# Patient Record
Sex: Female | Born: 1970 | Race: Black or African American | Hispanic: No | Marital: Married | Smoking: Never smoker
Health system: Southern US, Community
[De-identification: ages and names within clinical notes are randomized; demographics above are authoritative.]

## PROBLEM LIST (undated history)

## (undated) DIAGNOSIS — B354 Tinea corporis: Secondary | ICD-10-CM

## (undated) DIAGNOSIS — Z0000 Encounter for general adult medical examination without abnormal findings: Secondary | ICD-10-CM

## (undated) DIAGNOSIS — E079 Disorder of thyroid, unspecified: Secondary | ICD-10-CM

## (undated) DIAGNOSIS — T7840XA Allergy, unspecified, initial encounter: Secondary | ICD-10-CM

## (undated) DIAGNOSIS — I10 Essential (primary) hypertension: Secondary | ICD-10-CM

## (undated) DIAGNOSIS — IMO0002 Reserved for concepts with insufficient information to code with codable children: Secondary | ICD-10-CM

## (undated) DIAGNOSIS — E785 Hyperlipidemia, unspecified: Secondary | ICD-10-CM

## (undated) DIAGNOSIS — D649 Anemia, unspecified: Secondary | ICD-10-CM

## (undated) DIAGNOSIS — R51 Headache: Secondary | ICD-10-CM

## (undated) DIAGNOSIS — J189 Pneumonia, unspecified organism: Secondary | ICD-10-CM

## (undated) DIAGNOSIS — Z8489 Family history of other specified conditions: Secondary | ICD-10-CM

## (undated) DIAGNOSIS — E119 Type 2 diabetes mellitus without complications: Secondary | ICD-10-CM

## (undated) DIAGNOSIS — M25552 Pain in left hip: Secondary | ICD-10-CM

## (undated) DIAGNOSIS — M545 Low back pain: Secondary | ICD-10-CM

## (undated) DIAGNOSIS — R739 Hyperglycemia, unspecified: Secondary | ICD-10-CM

## (undated) DIAGNOSIS — S30861A Insect bite (nonvenomous) of abdominal wall, initial encounter: Secondary | ICD-10-CM

## (undated) DIAGNOSIS — K219 Gastro-esophageal reflux disease without esophagitis: Secondary | ICD-10-CM

## (undated) DIAGNOSIS — F32A Depression, unspecified: Secondary | ICD-10-CM

## (undated) DIAGNOSIS — M797 Fibromyalgia: Secondary | ICD-10-CM

## (undated) DIAGNOSIS — G629 Polyneuropathy, unspecified: Secondary | ICD-10-CM

## (undated) DIAGNOSIS — E049 Nontoxic goiter, unspecified: Secondary | ICD-10-CM

## (undated) DIAGNOSIS — D259 Leiomyoma of uterus, unspecified: Secondary | ICD-10-CM

## (undated) DIAGNOSIS — F419 Anxiety disorder, unspecified: Secondary | ICD-10-CM

## (undated) DIAGNOSIS — R5383 Other fatigue: Secondary | ICD-10-CM

## (undated) DIAGNOSIS — M25551 Pain in right hip: Secondary | ICD-10-CM

## (undated) DIAGNOSIS — R41840 Attention and concentration deficit: Secondary | ICD-10-CM

## (undated) DIAGNOSIS — J40 Bronchitis, not specified as acute or chronic: Secondary | ICD-10-CM

## (undated) DIAGNOSIS — I499 Cardiac arrhythmia, unspecified: Secondary | ICD-10-CM

## (undated) DIAGNOSIS — R000 Tachycardia, unspecified: Secondary | ICD-10-CM

## (undated) DIAGNOSIS — T8859XA Other complications of anesthesia, initial encounter: Secondary | ICD-10-CM

## (undated) DIAGNOSIS — J019 Acute sinusitis, unspecified: Secondary | ICD-10-CM

## (undated) DIAGNOSIS — W57XXXA Bitten or stung by nonvenomous insect and other nonvenomous arthropods, initial encounter: Secondary | ICD-10-CM

## (undated) DIAGNOSIS — T4145XA Adverse effect of unspecified anesthetic, initial encounter: Secondary | ICD-10-CM

## (undated) DIAGNOSIS — F329 Major depressive disorder, single episode, unspecified: Secondary | ICD-10-CM

## (undated) HISTORY — DX: Low back pain: M54.5

## (undated) HISTORY — DX: Bitten or stung by nonvenomous insect and other nonvenomous arthropods, initial encounter: W57.XXXA

## (undated) HISTORY — DX: Tachycardia, unspecified: R00.0

## (undated) HISTORY — DX: Anxiety disorder, unspecified: F41.9

## (undated) HISTORY — DX: Allergy, unspecified, initial encounter: T78.40XA

## (undated) HISTORY — DX: Reserved for concepts with insufficient information to code with codable children: IMO0002

## (undated) HISTORY — DX: Fibromyalgia: M79.7

## (undated) HISTORY — DX: Polyneuropathy, unspecified: G62.9

## (undated) HISTORY — DX: Gastro-esophageal reflux disease without esophagitis: K21.9

## (undated) HISTORY — DX: Leiomyoma of uterus, unspecified: D25.9

## (undated) HISTORY — DX: Type 2 diabetes mellitus without complications: E11.9

## (undated) HISTORY — DX: Insect bite (nonvenomous) of abdominal wall, initial encounter: S30.861A

## (undated) HISTORY — DX: Other fatigue: R53.83

## (undated) HISTORY — DX: Bronchitis, not specified as acute or chronic: J40

## (undated) HISTORY — DX: Hyperglycemia, unspecified: R73.9

## (undated) HISTORY — DX: Pain in right hip: M25.551

## (undated) HISTORY — DX: Anemia, unspecified: D64.9

## (undated) HISTORY — DX: Depression, unspecified: F32.A

## (undated) HISTORY — DX: Major depressive disorder, single episode, unspecified: F32.9

## (undated) HISTORY — DX: Pain in left hip: M25.552

## (undated) HISTORY — DX: Headache: R51

## (undated) HISTORY — DX: Attention and concentration deficit: R41.840

## (undated) HISTORY — DX: Acute sinusitis, unspecified: J01.90

## (undated) HISTORY — DX: Hyperlipidemia, unspecified: E78.5

## (undated) HISTORY — DX: Encounter for general adult medical examination without abnormal findings: Z00.00

## (undated) HISTORY — DX: Tinea corporis: B35.4

## (undated) HISTORY — DX: Disorder of thyroid, unspecified: E07.9

## (undated) HISTORY — DX: Essential (primary) hypertension: I10

---

## 1997-02-01 HISTORY — PX: BREAST LUMPECTOMY: SHX2

## 1997-10-24 ENCOUNTER — Other Ambulatory Visit: Admission: RE | Admit: 1997-10-24 | Discharge: 1997-10-24 | Payer: Self-pay | Admitting: Obstetrics and Gynecology

## 1998-07-25 ENCOUNTER — Ambulatory Visit (HOSPITAL_BASED_OUTPATIENT_CLINIC_OR_DEPARTMENT_OTHER): Admission: RE | Admit: 1998-07-25 | Discharge: 1998-07-25 | Payer: Self-pay | Admitting: General Surgery

## 1998-10-29 ENCOUNTER — Other Ambulatory Visit: Admission: RE | Admit: 1998-10-29 | Discharge: 1998-10-29 | Payer: Self-pay | Admitting: Obstetrics and Gynecology

## 1999-11-10 ENCOUNTER — Other Ambulatory Visit: Admission: RE | Admit: 1999-11-10 | Discharge: 1999-11-10 | Payer: Self-pay | Admitting: Obstetrics and Gynecology

## 2001-12-26 ENCOUNTER — Other Ambulatory Visit: Admission: RE | Admit: 2001-12-26 | Discharge: 2001-12-26 | Payer: Self-pay | Admitting: Obstetrics and Gynecology

## 2002-03-17 ENCOUNTER — Inpatient Hospital Stay (HOSPITAL_COMMUNITY): Admission: AD | Admit: 2002-03-17 | Discharge: 2002-03-17 | Payer: Self-pay | Admitting: Obstetrics and Gynecology

## 2002-03-17 ENCOUNTER — Encounter: Payer: Self-pay | Admitting: Obstetrics and Gynecology

## 2002-07-25 ENCOUNTER — Inpatient Hospital Stay (HOSPITAL_COMMUNITY): Admission: AD | Admit: 2002-07-25 | Discharge: 2002-07-28 | Payer: Self-pay | Admitting: Obstetrics and Gynecology

## 2002-08-02 ENCOUNTER — Encounter (INDEPENDENT_AMBULATORY_CARE_PROVIDER_SITE_OTHER): Payer: Self-pay | Admitting: *Deleted

## 2002-08-02 LAB — CONVERTED CEMR LAB

## 2002-08-27 ENCOUNTER — Other Ambulatory Visit: Admission: RE | Admit: 2002-08-27 | Discharge: 2002-08-27 | Payer: Self-pay | Admitting: Obstetrics and Gynecology

## 2003-05-20 ENCOUNTER — Encounter: Admission: RE | Admit: 2003-05-20 | Discharge: 2003-05-20 | Payer: Self-pay | Admitting: Family Medicine

## 2003-05-23 ENCOUNTER — Encounter: Admission: RE | Admit: 2003-05-23 | Discharge: 2003-05-23 | Payer: Self-pay | Admitting: Family Medicine

## 2005-02-04 ENCOUNTER — Emergency Department (HOSPITAL_COMMUNITY): Admission: EM | Admit: 2005-02-04 | Discharge: 2005-02-05 | Payer: Self-pay | Admitting: Emergency Medicine

## 2006-04-01 ENCOUNTER — Encounter (INDEPENDENT_AMBULATORY_CARE_PROVIDER_SITE_OTHER): Payer: Self-pay | Admitting: *Deleted

## 2007-02-02 DIAGNOSIS — M797 Fibromyalgia: Secondary | ICD-10-CM

## 2007-02-02 HISTORY — DX: Fibromyalgia: M79.7

## 2008-10-04 ENCOUNTER — Emergency Department (HOSPITAL_COMMUNITY): Admission: EM | Admit: 2008-10-04 | Discharge: 2008-10-05 | Payer: Self-pay | Admitting: Emergency Medicine

## 2009-11-26 ENCOUNTER — Encounter: Admission: RE | Admit: 2009-11-26 | Discharge: 2009-11-26 | Payer: Self-pay | Admitting: Obstetrics and Gynecology

## 2010-05-08 LAB — URINALYSIS, ROUTINE W REFLEX MICROSCOPIC
Bilirubin Urine: NEGATIVE
Glucose, UA: NEGATIVE mg/dL
Hgb urine dipstick: NEGATIVE
Ketones, ur: >80 mg/dL — AB
Nitrite: NEGATIVE
Protein, ur: NEGATIVE mg/dL
Specific Gravity, Urine: 1.023 (ref 1.005–1.030)
Urobilinogen, UA: 0.2 mg/dL (ref 0.0–1.0)
pH: 7.5 (ref 5.0–8.0)

## 2010-05-08 LAB — URINE MICROSCOPIC-ADD ON

## 2010-05-08 LAB — COMPREHENSIVE METABOLIC PANEL
AST: 28 U/L (ref 0–37)
Albumin: 4.3 g/dL (ref 3.5–5.2)
BUN: 7 mg/dL (ref 6–23)
Calcium: 9.5 mg/dL (ref 8.4–10.5)
Chloride: 103 mEq/L (ref 96–112)
Creatinine, Ser: 0.75 mg/dL (ref 0.4–1.2)
GFR calc Af Amer: >60 mL/min (ref >60)
Total Protein: 7.9 g/dL (ref 6.0–8.3)

## 2010-05-08 LAB — DIFFERENTIAL
Basophils Absolute: 0 10*3/uL (ref 0.0–0.1)
Eosinophils Relative: 0 % (ref 0–5)
Lymphocytes Relative: 22 % (ref 12–46)
Lymphs Abs: 1.7 10*3/uL (ref 0.7–4.0)
Monocytes Absolute: 0.4 10*3/uL (ref 0.1–1.0)
Monocytes Relative: 5 % (ref 3–12)
Neutro Abs: 5.7 10*3/uL (ref 1.7–7.7)

## 2010-05-08 LAB — CBC
HCT: 39.2 % (ref 36.0–46.0)
MCV: 89.3 fL (ref 78.0–100.0)
Platelets: 343 10*3/uL (ref 150–400)
RDW: 16.2 % — ABNORMAL HIGH (ref 11.5–15.5)
WBC: 7.8 10*3/uL (ref 4.0–10.5)

## 2010-06-19 NOTE — H&P (Signed)
   NAME:  Lauren Hall, Lauren Hall NO.:  [id number]   MEDICAL RECORD NO.:  [id number]                   PATIENT TYPE:   LOCATION:                                       FACILITY:   PHYSICIAN:  Lenoard Aden, M.D.             DATE OF BIRTH:   DATE OF ADMISSION:  07/24/2002  DATE OF DISCHARGE:                                HISTORY & PHYSICAL   CHIEF COMPLAINT:  IUGR.   HISTORY OF PRESENT ILLNESS:  A 40 year old African-American female, gravida  1, para 0, EDC August 06, 2002, at 38 weeks, who presents for induction for  IUGR, cervical ripening and induction.   PAST MEDICAL HISTORY:  Remarkable for no medical hospitalizations. History  of lumpectomy.   FAMILY HISTORY:  History of heart disease.  Insulin-dependent diabetes  mellitus.   PRENATAL LABORATORY DATA:  Blood type of Rh positive, rubella immune,  hepatitis B surface antigen negative, HIV nonreactive.   PHYSICAL EXAMINATION:  GENERAL: She is a well-developed, well-nourished,  African-American female in no acute distress.  HEENT:  Normal.  LUNGS:  Clear.  HEART:  Regular rate and rhythm.  ABDOMEN:  Soft, gravid, and nontender. Estimated fetal weight on ultrasound  5 pounds 12 ounces.  PELVIC:  Cervix is closed, 2 cm, and vertex -1.  EXTREMITIES:  No cords.  NEUROLOGY:  Nonfocal.   IMPRESSION:  1. 38-week intrauterine pregnancy.  2. Intrauterine growth retardation for cervical ripening and induction.   PLAN:  Proceed with cervical ripening and induction.                                               Lenoard Aden, M.D.    RJT/MEDQ  D:  07/23/2002  T:  07/23/2002  Job:  [id number]

## 2010-06-19 NOTE — Op Note (Signed)
   NAME:  Lauren Hall, Lauren Hall                        ACCOUNT NO.:  [id number]   MEDICAL RECORD NO.:  [id number]                   PATIENT TYPE:  INP   LOCATION:  9130                                 FACILITY:  [facility]   PHYSICIAN:  Lenoard Aden, M.D.             DATE OF BIRTH:  [birth year]   DATE OF PROCEDURE:  07/26/2002  DATE OF DISCHARGE:                                 OPERATIVE REPORT   INDICATION FOR OPERATIVE DELIVERY:  1. Persistent variable decelerations.  2. Nonreassuring fetal heart rate.   POSTOPERATIVE DIAGNOSES:  1. Persistent variable decelerations.  2. Nonreassuring fetal heart rate.  3. Moderate to severe shoulder dystocia.   DESCRIPTION OF PROCEDURE:  After being apprised of risks and benefits of  vacuum assistance including small risk of scalp laceration, cephalohematoma,  and intracranial hemorrhage, a Kiwi cup is placed on fetal vertex.  ROA less  than 45 degrees, +3 station for three pulls over central median episiotomy  of a full-term living female.  Upon delivery of the vertex, turtle sign is  observed and moderate to severe shoulder dystocia is encountered.  The  McRoberts maneuver is assumed.  Suprapubic pressure is applied with little  release of the anterior shoulder.  Wood screw maneuver is performed moving  the baby into a transverse position whereby spontaneous expulsion of the  fetus is noted.  Apgars 4 and 8, pediatricians called and are in attendance.  Cord blood collected, cord pH of 7.19.  The placenta is delivered  spontaneously intact, three vessel cord.  Cervix and vagina without  lacerations.  Repair of a second-degree episiotomy with a partial third-  degree extension with 3-0 Monocryl and a 3-0 Vicryl Rapide without  complications.  Good hemostasis is noted.  Estimated blood loss 600 mL.  Mother and baby tolerated the procedure well and are both recovering in good  condition.  The fetus is in newborn nursery.                    Lenoard Aden, M.D.    RJT/MEDQ  D:  07/26/2002  T:  07/28/2002  Job:  [id number]

## 2010-10-07 ENCOUNTER — Ambulatory Visit (INDEPENDENT_AMBULATORY_CARE_PROVIDER_SITE_OTHER): Payer: BC Managed Care – PPO | Admitting: Family Medicine

## 2010-10-07 ENCOUNTER — Encounter: Payer: Self-pay | Admitting: Family Medicine

## 2010-10-07 VITALS — BP 147/89 | HR 87 | Temp 98.7°F | Ht 59.25 in | Wt 131.1 lb

## 2010-10-07 DIAGNOSIS — R21 Rash and other nonspecific skin eruption: Secondary | ICD-10-CM

## 2010-10-07 DIAGNOSIS — T7840XA Allergy, unspecified, initial encounter: Secondary | ICD-10-CM | POA: Insufficient documentation

## 2010-10-07 DIAGNOSIS — E079 Disorder of thyroid, unspecified: Secondary | ICD-10-CM

## 2010-10-07 DIAGNOSIS — Z23 Encounter for immunization: Secondary | ICD-10-CM

## 2010-10-07 DIAGNOSIS — F418 Other specified anxiety disorders: Secondary | ICD-10-CM | POA: Insufficient documentation

## 2010-10-07 DIAGNOSIS — R51 Headache: Secondary | ICD-10-CM

## 2010-10-07 DIAGNOSIS — M797 Fibromyalgia: Secondary | ICD-10-CM

## 2010-10-07 DIAGNOSIS — IMO0001 Reserved for inherently not codable concepts without codable children: Secondary | ICD-10-CM

## 2010-10-07 DIAGNOSIS — I10 Essential (primary) hypertension: Secondary | ICD-10-CM

## 2010-10-07 DIAGNOSIS — R5383 Other fatigue: Secondary | ICD-10-CM

## 2010-10-07 DIAGNOSIS — R5381 Other malaise: Secondary | ICD-10-CM

## 2010-10-07 DIAGNOSIS — E559 Vitamin D deficiency, unspecified: Secondary | ICD-10-CM

## 2010-10-07 DIAGNOSIS — E785 Hyperlipidemia, unspecified: Secondary | ICD-10-CM

## 2010-10-07 DIAGNOSIS — F329 Major depressive disorder, single episode, unspecified: Secondary | ICD-10-CM | POA: Insufficient documentation

## 2010-10-07 DIAGNOSIS — D649 Anemia, unspecified: Secondary | ICD-10-CM | POA: Insufficient documentation

## 2010-10-07 DIAGNOSIS — K5900 Constipation, unspecified: Secondary | ICD-10-CM

## 2010-10-07 LAB — LIPID PANEL
HDL: 70.3 mg/dL (ref >39.00)
LDL Cholesterol: 86 mg/dL (ref 0–99)
VLDL: 19.2 mg/dL (ref 0.0–40.0)

## 2010-10-07 LAB — HEPATIC FUNCTION PANEL
Albumin: 4.3 g/dL (ref 3.5–5.2)
Alkaline Phosphatase: 43 U/L (ref 39–117)
Total Bilirubin: 0.5 mg/dL (ref 0.3–1.2)

## 2010-10-07 LAB — TSH: TSH: 0.26 u[IU]/mL — ABNORMAL LOW (ref 0.35–5.50)

## 2010-10-07 LAB — T4, FREE: Free T4: 0.69 ng/dL (ref 0.60–1.60)

## 2010-10-07 LAB — MAGNESIUM: Magnesium: 2.1 mg/dL (ref 1.5–2.5)

## 2010-10-07 LAB — SEDIMENTATION RATE: Sed Rate: 6 mm/hr (ref 0–22)

## 2010-10-07 LAB — RENAL FUNCTION PANEL
BUN: 8 mg/dL (ref 6–23)
CO2: 24 mEq/L (ref 19–32)
Chloride: 104 mEq/L (ref 96–112)
GFR: 133.35 mL/min (ref >60.00)
Phosphorus: 2.7 mg/dL (ref 2.3–4.6)
Potassium: 3.9 mEq/L (ref 3.5–5.1)
Sodium: 137 mEq/L (ref 135–145)

## 2010-10-07 LAB — FERRITIN: Ferritin: 79.6 ng/mL (ref 10.0–291.0)

## 2010-10-07 MED ORDER — BENEFIBER PO POWD: ORAL | Status: DC

## 2010-10-07 MED ORDER — OMEGA-3 FATTY ACIDS 1000 MG PO CAPS: ORAL_CAPSULE | ORAL | Status: DC

## 2010-10-07 MED ORDER — ALIGN PO CAPS: 1.0000 | ORAL_CAPSULE | Freq: Every day | ORAL | Status: DC

## 2010-10-07 MED ORDER — BACLOFEN 10 MG PO TABS: 10.0000 mg | ORAL_TABLET | Freq: Two times a day (BID) | ORAL | Status: DC | PRN

## 2010-10-07 NOTE — Patient Instructions (Addendum)
Fibromyalgia Fibromyalgia is a disorder that is often misunderstood. It is associated with muscular pains and tenderness that comes and goes. It is often associated with fatigue and sleep disturbances. Though it tends to be long-lasting, fibromyalgia is not life-threatening. CAUSES The exact cause of fibromyalgia is unknown. People with certain gene types are predisposed to developing fibromyalgia and other conditions. Certain factors can play a role as triggers, such as:  Spine disorders.   Arthritis.   Severe injury (trauma) and other physical stressors.   Emotional stressors.  SYMPTOMS  The main symptom is pain and stiffness in the muscles and joints, which can vary over time.   Sleep and fatigue problems.  Other related symptoms may include:  Bowel and bladder problems.   Headaches.   Visual problems.   Problems with odors and noises.   Depression or mood changes.   Painful periods (dysmenorrhea).   Dryness of the skin or eyes.  DIAGNOSIS There are no specific tests for diagnosing fibromyalgia. Patients can be diagnosed accurately from the specific symptoms they have. The diagnosis is made by determining that nothing else is causing the problems. TREATMENT There is no cure. Management includes medicines and an active, healthy lifestyle. The goal is to enhance physical fitness, decrease pain, and improve sleep. HOME CARE INSTRUCTIONS  Only take over-the-counter or prescription medicines as directed by your caregiver. Sleeping pills, tranquilizers, and pain medicines may make your problems worse.   Low-impact aerobic exercise is very important and advised for treatment. At first, it may seem to make pain worse. Gradually increasing your tolerance will overcome this feeling.   Learning relaxation techniques and how to control stress will help you. Biofeedback, visual imagery, hypnosis, muscle relaxation, yoga, and meditation are all options.   Anti-inflammatory medicines  and physical therapy may provide short-term help.   Acupuncture or massage treatments may help.   Take muscle relaxant medicines as suggested by your caregiver.   Avoid stressful situations.   Plan a healthy lifestyle. This includes your diet, sleep, rest, exercise, and friends.   Find and practice a hobby you enjoy.   Join a fibromyalgia support group for interaction, ideas, and sharing advice. This may be helpful.  SEEK MEDICAL CARE IF: You are not having good results or improvement from your treatment. FOR MORE INFORMATION National Fibromyalgia Association: www.fmaware.org Arthritis Foundation: www.arthritis.org Document Released: 01/18/2005 Document Re-Released: 07/08/2009 [facility] Patient Information 2011 [place], [place].  Remember 64 oz of clear fluids daily, avoid caffeine Add 10 minutes of exercise twice a day Eat small, frequent meals, avoid trans fats (AKA partially hydrogenated oils) Consider counseling Start a probiotic such as Align daily  Need 7-8 hours of sleep nightly, dark, quiet, cool room for sleep

## 2010-10-08 ENCOUNTER — Encounter: Payer: Self-pay | Admitting: Family Medicine

## 2010-10-08 DIAGNOSIS — E079 Disorder of thyroid, unspecified: Secondary | ICD-10-CM

## 2010-10-08 DIAGNOSIS — E039 Hypothyroidism, unspecified: Secondary | ICD-10-CM

## 2010-10-08 DIAGNOSIS — R5383 Other fatigue: Secondary | ICD-10-CM | POA: Insufficient documentation

## 2010-10-08 DIAGNOSIS — R51 Headache: Secondary | ICD-10-CM

## 2010-10-08 HISTORY — DX: Headache: R51

## 2010-10-08 HISTORY — DX: Hypothyroidism, unspecified: E03.9

## 2010-10-08 HISTORY — DX: Other fatigue: R53.83

## 2010-10-08 HISTORY — DX: Disorder of thyroid, unspecified: E07.9

## 2010-10-08 LAB — IRON AND TIBC: Iron: 175 ug/dL — ABNORMAL HIGH (ref 42–145)

## 2010-10-08 NOTE — Assessment & Plan Note (Signed)
Patient reports a history of fluctuating TSH levels in the past, we will request old records. Her TSH is mildly suppressed today but her freeT4 in normal will repeat levels at next visit to further evaluate.

## 2010-10-08 NOTE — Assessment & Plan Note (Signed)
Is on Pristiq 100mg  daily and feels it helps somewhat but not enough. She reports poor response to SSRI in past.

## 2010-10-08 NOTE — Assessment & Plan Note (Addendum)
Patient noting increased muscle and hand and ankle pain. Will add Baclofen 10mg  bid for muscle pain. Encouraged her to start a fish oil cap and to get more exercise. Reassess at next visit.   Lyrica and Celebrex

## 2010-10-08 NOTE — Assessment & Plan Note (Signed)
Multifactorial and related to fibromyalgia, anemia, depression, poor diet and fluid intake etc. She is advised to get 8 hours of sleep nightly, eat small, frequent meals with lean proteins and complex carbs. She needs to increase her fluids to at least 64 oz daily and she needs to add in a brisk walk daily

## 2010-10-08 NOTE — Assessment & Plan Note (Signed)
No recent flares 

## 2010-10-08 NOTE — Progress Notes (Signed)
Lauren Hall [id number] 1970-04-03 10/08/2010      Progress Note New Patient  Subjective  Chief Complaint  Chief Complaint  Patient presents with  . Establish Care    new patient    HPI  Patient is a 40 yo AA female in today for new patient appt. She has a long history of fibromyalgia, fatigue and depression. She is concerned because several months ago she was bit by a tick on her right hip. She developed a red, swollen, pruritic lesion at that site and she feels she has been struggling with more HA/fatigue/myalgias since that time. She takes multiple medications for her fibromyalgia and yet she still struggles with daily pain. No recent respiratory illness, CP/palp/sob/GI or GU c/o. She denies any suicidal ideation but she does believe her depression is overwhelming largely due to her pain. She has trouble sleeping secondary to the pain.   Past Medical History  Diagnosis Date  . Anxiety   . Fibromyalgia 2009  . Fibromyalgia   . Anemia   . Allergy   . Depression   . Hypertension   . Fatigue 10/08/2010  . Headache 10/08/2010  . Thyroid disease 10/08/2010    Past Surgical History  Procedure Date  . Breast lumpectomy 1999    right- benign    Family History  Problem Relation Age of Onset  . Fibromyalgia Mother   . Diabetes Mother     type 2  . Hypertension Mother   . Hyperlipidemia Mother   . Alcohol abuse Father   . Cancer Father     colon  . Hypertension Brother   . Heart disease Maternal Grandmother   . Hyperlipidemia Maternal Grandmother   . Diabetes Maternal Grandmother     type 2  . Cancer Maternal Grandmother     colon  . Stroke Maternal Grandfather   . Diabetes Maternal Grandfather     type 2  . Cancer Paternal Grandmother     ovarian  . Alzheimer's disease Paternal Grandfather   . Alcohol abuse Brother     History   Social History  . Marital Status: Married    Spouse Name: N/A    Number of Children: N/A  . Years of Education: N/A    Occupational History  . Not on file.   Social History Main Topics  . Smoking status: Never Smoker   . Smokeless tobacco: Never Used  . Alcohol Use: Yes     special occassion- very rarely  . Drug Use: No  . Sexually Active: Yes -- Female partner(s)   Other Topics Concern  . Not on file   Social History Narrative  . No narrative on file    No current outpatient prescriptions on file prior to visit.    Allergies  Allergen Reactions  . Ritalin (Methylphenidate Hcl) Swelling    Tongue swelled    Review of Systems  Review of Systems  Constitutional: Positive for malaise/fatigue. Negative for fever and chills.  HENT: Positive for neck pain. Negative for hearing loss, nosebleeds and congestion.   Eyes: Negative for discharge.  Respiratory: Negative for cough, sputum production, shortness of breath and wheezing.   Cardiovascular: Negative for chest pain, palpitations and leg swelling.  Gastrointestinal: Positive for constipation. Negative for heartburn, nausea, vomiting, abdominal pain, diarrhea and blood in stool.  Genitourinary: Negative for dysuria, urgency, frequency and hematuria.  Musculoskeletal: Positive for myalgias, back pain and joint pain. Negative for falls.  Skin: Negative for rash.  Neurological: Positive for headaches.  Negative for dizziness, tremors, sensory change, focal weakness, seizures, loss of consciousness and weakness.  Endo/Heme/Allergies: Negative for polydipsia. Does not bruise/bleed easily.  Psychiatric/Behavioral: Positive for depression. Negative for suicidal ideas. The patient is not nervous/anxious and does not have insomnia.     Objective  BP 147/89  Pulse 87  Temp(Src) 98.7 F (37.1 C) (Oral)  Ht 4' 11.25" (1.505 m)  Wt 131 lb 1.9 oz (59.476 kg)  BMI 26.26 kg/m2  SpO2 99%  LMP 09/29/2010  Physical Exam  Physical Exam  Constitutional: She is oriented to person, place, and time and well-developed, well-nourished, and in no distress.  No distress.  HENT:  Head: Normocephalic and atraumatic.  Eyes: Conjunctivae are normal.  Neck: Neck supple. No thyromegaly present.  Cardiovascular: Normal rate, regular rhythm and normal heart sounds.   No murmur heard. Pulmonary/Chest: Effort normal and breath sounds normal. She has no wheezes.  Abdominal: She exhibits no distension and no mass.  Musculoskeletal: She exhibits no edema.  Lymphadenopathy:    She has no cervical adenopathy.  Neurological: She is alert and oriented to person, place, and time.  Skin: Skin is warm and dry. No rash noted. She is not diaphoretic.  Psychiatric: Memory, affect and judgment normal.       Assessment & Plan  Thyroid disease Patient reports a history of fluctuating TSH levels in the past, we will request old records. Her TSH is mildly suppressed today but her freeT4 in normal will repeat levels at next visit to further evaluate.  Headache Patient reports a bad tick bite a couple of months ago with a  Raised erythematous rash on her right anterio hip acocmpanied by increased malaise, fatigue and HA at that time. Will check Lyme, rmsf titers at her request. encouraged 64 oz of clear fluids and small frequent meals. She also notes some low grade fevers have been recuring to around a 100 many nights a week.  Fatigue Multifactorial and related to fibromyalgia, anemia, depression, poor diet and fluid intake etc. She is advised to get 8 hours of sleep nightly, eat small, frequent meals with lean proteins and complex carbs. She needs to increase her fluids to at least 64 oz daily and she needs to add in a brisk walk daily  Hypertension Tolerating Lisinopril with minimal elevation in BP today, will reassess at second visit before considering any medication changes.  Anemia Resolved with labs today. Will continue to monitor  Depression Is on Pristiq 100mg  daily and feels it helps somewhat but not enough. She reports poor response to SSRI in  past.  Fibromyalgia Patient noting increased muscle and hand and ankle pain. Will add Baclofen 10mg  bid for muscle pain. Encouraged her to start a fish oil cap and to get more exercise. Reassess at next visit.   Lyrica and Celebrex  Allergy No recent flares

## 2010-10-08 NOTE — Assessment & Plan Note (Addendum)
Tolerating Lisinopril with minimal elevation in BP today, will reassess at second visit before considering any medication changes.

## 2010-10-08 NOTE — Assessment & Plan Note (Addendum)
Patient reports a bad tick bite a couple of months ago with a  Raised erythematous rash on her right anterio hip acocmpanied by increased malaise, fatigue and HA at that time. Will check Lyme, rmsf titers at her request. encouraged 64 oz of clear fluids and small frequent meals. She also notes some low grade fevers have been recuring to around a 100 many nights a week.

## 2010-10-08 NOTE — Assessment & Plan Note (Signed)
Resolved with labs today. Will continue to monitor

## 2010-10-12 LAB — VITAMIN D 1,25 DIHYDROXY
Vitamin D 1, 25 (OH)2 Total: 81 pg/mL — ABNORMAL HIGH (ref 18–72)
Vitamin D2 1, 25 (OH)2: 17 pg/mL

## 2010-10-13 ENCOUNTER — Telehealth: Payer: Self-pay

## 2010-10-13 MED ORDER — LORAZEPAM 0.5 MG PO TABS: 0.5000 mg | ORAL_TABLET | Freq: Two times a day (BID) | ORAL | Status: DC | PRN

## 2010-10-13 NOTE — Telephone Encounter (Signed)
Pt would like to know if she needs to continue on the iron? If so is this something she will always have to take? Pt stated that MD was supposed to RX something for depression and anxiety to pharmacy?  Labs mailed to patient per request.

## 2010-10-13 NOTE — Telephone Encounter (Signed)
Faxed RX to pharmacy.  

## 2010-10-13 NOTE — Telephone Encounter (Signed)
Left a detailed message on patients voicemail.

## 2010-10-13 NOTE — Telephone Encounter (Signed)
Notify iron level was on the hi side so what she could do is just drop her OTC iron tab to just twice a week and we will recheck in the future. I did not want to change her baseline Pristiq until the next visit due to her complicated history. She can have a few Ativan 0.5mg  1 tab po bid prn anxiety/insomnia to use as needed. Disp#30, 1 rf til seen. I will consider switching her Pristiq to another agent but with the difficulties she has had in the past. I feel like we need to pick an agent together at the next visit.

## 2010-10-14 ENCOUNTER — Encounter: Payer: Self-pay | Admitting: Family Medicine

## 2010-11-06 ENCOUNTER — Ambulatory Visit (INDEPENDENT_AMBULATORY_CARE_PROVIDER_SITE_OTHER): Payer: BC Managed Care – PPO | Admitting: Family Medicine

## 2010-11-06 ENCOUNTER — Encounter: Payer: Self-pay | Admitting: Family Medicine

## 2010-11-06 DIAGNOSIS — R51 Headache: Secondary | ICD-10-CM

## 2010-11-06 DIAGNOSIS — R5383 Other fatigue: Secondary | ICD-10-CM

## 2010-11-06 DIAGNOSIS — F32A Depression, unspecified: Secondary | ICD-10-CM

## 2010-11-06 DIAGNOSIS — M797 Fibromyalgia: Secondary | ICD-10-CM

## 2010-11-06 DIAGNOSIS — R5381 Other malaise: Secondary | ICD-10-CM

## 2010-11-06 DIAGNOSIS — M25551 Pain in right hip: Secondary | ICD-10-CM

## 2010-11-06 DIAGNOSIS — F329 Major depressive disorder, single episode, unspecified: Secondary | ICD-10-CM

## 2010-11-06 DIAGNOSIS — IMO0001 Reserved for inherently not codable concepts without codable children: Secondary | ICD-10-CM

## 2010-11-06 DIAGNOSIS — E079 Disorder of thyroid, unspecified: Secondary | ICD-10-CM

## 2010-11-06 DIAGNOSIS — M25559 Pain in unspecified hip: Secondary | ICD-10-CM

## 2010-11-06 DIAGNOSIS — I10 Essential (primary) hypertension: Secondary | ICD-10-CM

## 2010-11-06 DIAGNOSIS — D649 Anemia, unspecified: Secondary | ICD-10-CM

## 2010-11-06 HISTORY — DX: Pain in right hip: M25.551

## 2010-11-06 MED ORDER — TRAMADOL HCL 50 MG PO TABS: 50.0000 mg | ORAL_TABLET | ORAL | Status: DC

## 2010-11-06 MED ORDER — DESVENLAFAXINE SUCCINATE ER 50 MG PO TB24: ORAL_TABLET | ORAL | Status: DC

## 2010-11-06 MED ORDER — LIDOCAINE 5 % EX PTCH: 2.0000 | MEDICATED_PATCH | CUTANEOUS | Status: AC

## 2010-11-06 MED ORDER — LISINOPRIL 10 MG PO TABS: 10.0000 mg | ORAL_TABLET | Freq: Two times a day (BID) | ORAL | Status: DC

## 2010-11-06 MED ORDER — BUPROPION HCL 100 MG PO TABS: ORAL_TABLET | ORAL | Status: DC

## 2010-11-06 NOTE — Assessment & Plan Note (Addendum)
Previously confirmed by Rheumatology. Still with daily pain from first arising and continuing throughout the day. Improves some when she first gets moving and takes her am meds, only to worsen again later in the day. She may increase her Gabapentin to 4 tabs daily as tolerated, continue her Tramadol.

## 2010-11-06 NOTE — Assessment & Plan Note (Signed)
Patient with long standing history of b/l hip pain has used Lidoderm Patches previously with good results, they helpt he most during the day when she is at work and needs to use something non sedating. She is given an rx for Lidoderm Patches 5% to apply 1 to each hip daily as needed

## 2010-11-06 NOTE — Patient Instructions (Signed)

## 2010-11-15 NOTE — Assessment & Plan Note (Signed)
Resolved with recent blood draw 

## 2010-11-15 NOTE — Assessment & Plan Note (Signed)
Initially thought she had had a sleep study recently but it may have been more than a few years, she will go home and try and figure that out, we may need to proceed with a second study

## 2010-11-15 NOTE — Progress Notes (Signed)
Lauren Hall [id number] [birth year] 11/15/2010      Progress Note-Follow Up  Subjective  Chief Complaint  Chief Complaint  Patient presents with  . Follow-up    1 month follow up    HPI  She is a 40 year old [place] American female who is in today with persistent fatigue, pain. She's previously been diagnosed with fibromyalgia by a rheumatologist. Is on multiple medications that she thinks helps somewhat. She continues to struggle with the fatigued and acknowledges that she wakes up frequently and has trouble falling asleep. Says she's had a sleep study in the past and initially thinks it was within the last 2 years but then begins to this and realized the study may have been more distantly in the past. She has been snoring more over the last year as well. She continues to struggle with depression and anxiety. Has anhedonia difficulty concentrating. Denies any suicidal ideation. No recent febrile illness, chest pain, shortness of breath, GI or GU concerns.  Past Medical History  Diagnosis Date  . Anxiety   . Fibromyalgia 2009  . Fibromyalgia   . Anemia   . Allergy   . Depression   . Hypertension   . Fatigue 10/08/2010  . Headache 10/08/2010  . Thyroid disease 10/08/2010  . Hip pain, bilateral 11/06/2010    Past Surgical History  Procedure Date  . Breast lumpectomy 1999    right- benign    Family History  Problem Relation Age of Onset  . Fibromyalgia Mother   . Diabetes Mother     type 2  . Hypertension Mother   . Hyperlipidemia Mother   . Alcohol abuse Father   . Cancer Father     colon  . Hypertension Brother   . Heart disease Maternal Grandmother   . Hyperlipidemia Maternal Grandmother   . Diabetes Maternal Grandmother     type 2  . Cancer Maternal Grandmother     colon  . Stroke Maternal Grandfather   . Diabetes Maternal Grandfather     type 2  . Cancer Paternal Grandmother     ovarian  . Alzheimer's disease Paternal Grandfather   . Alcohol abuse Brother      History   Social History  . Marital Status: Married    Spouse Name: N/A    Number of Children: N/A  . Years of Education: N/A   Occupational History  . Not on file.   Social History Main Topics  . Smoking status: Never Smoker   . Smokeless tobacco: Never Used  . Alcohol Use: Yes     special occassion- very rarely  . Drug Use: No  . Sexually Active: Yes -- Female partner(s)   Other Topics Concern  . Not on file   Social History Narrative  . No narrative on file    Current Outpatient Prescriptions on File Prior to Visit  Medication Sig Dispense Refill  . Cholecalciferol (VITAMIN D) 2000 UNITS CAPS Take 2,000 capsules by mouth 2 (two) times daily.        . fish oil-omega-3 fatty acids 1000 MG capsule MegaRed 1 cap by mouth daily (by Schiff)  30 capsule  1  . gabapentin (NEURONTIN) 300 MG capsule Take 300 mg by mouth as directed. 1 in am, 2 in pm       . Levonorgestrel-Ethinyl Estradiol (CAMRESE) 0.15-0.03 &0.01 MG tablet Take 1 tablet by mouth daily.        Marland Kitchen LORazepam (ATIVAN) 0.5 MG tablet Take 1 tablet (0.5 mg total)  by mouth 2 (two) times daily as needed. For anxiety/ insomnia  30 tablet  1  . Vitamin D, Ergocalciferol, (DRISDOL) 50000 UNITS CAPS Take 50,000 Units by mouth every 14 (fourteen) days.        . Wheat Dextrin (BENEFIBER) POWD 2 tsp po daily to bid prn constipation, in 6-8 oz of fluid  1 Can  3    Allergies  Allergen Reactions  . Ritalin (Methylphenidate Hcl) Swelling    Tongue swelled    Review of Systems  Review of Systems  Constitutional: Positive for malaise/fatigue. Negative for fever.  HENT: Positive for neck pain. Negative for congestion.   Eyes: Negative for discharge.  Respiratory: Negative for shortness of breath.   Cardiovascular: Negative for chest pain, palpitations and leg swelling.  Gastrointestinal: Negative for nausea, abdominal pain and diarrhea.  Genitourinary: Negative for dysuria.  Musculoskeletal: Positive for myalgias, back  pain and joint pain. Negative for falls.  Skin: Negative for rash.  Neurological: Negative for loss of consciousness and headaches.  Endo/Heme/Allergies: Negative for polydipsia.  Psychiatric/Behavioral: Positive for depression. Negative for suicidal ideas. The patient is nervous/anxious and has insomnia.     Objective  BP 138/92  Pulse 90  Temp(Src) 98.5 F (36.9 C) (Oral)  Ht 4' 11.25" (1.505 m)  Wt 134 lb 6.4 oz (60.963 kg)  BMI 26.92 kg/m2  SpO2 96%  Physical Exam  Physical Exam  Constitutional: She is oriented to person, place, and time and well-developed, well-nourished, and in no distress. No distress.  HENT:  Head: Normocephalic and atraumatic.  Eyes: Conjunctivae are normal.  Neck: Neck supple. No thyromegaly present.  Cardiovascular: Normal rate, regular rhythm and normal heart sounds.   No murmur heard. Pulmonary/Chest: Effort normal and breath sounds normal. She has no wheezes.  Abdominal: She exhibits no distension and no mass.  Musculoskeletal: She exhibits no edema.  Lymphadenopathy:    She has no cervical adenopathy.  Neurological: She is alert and oriented to person, place, and time.  Skin: Skin is warm and dry. No rash noted. She is not diaphoretic.  Psychiatric: Memory, affect and judgment normal.    Lab Results  Component Value Date   TSH 0.26* 10/07/2010   Lab Results  Component Value Date   WBC 7.8 10/04/2008   HGB 13.1 10/04/2008   HCT 39.2 10/04/2008   MCV 89.3 10/04/2008   PLT 343 10/04/2008   Lab Results  Component Value Date   CREATININE 0.6 10/07/2010   BUN 8 10/07/2010   NA 137 10/07/2010   K 3.9 10/07/2010   CL 104 10/07/2010   CO2 24 10/07/2010   Lab Results  Component Value Date   ALT 17 10/07/2010   AST 18 10/07/2010   ALKPHOS 43 10/07/2010   BILITOT 0.5 10/07/2010   Lab Results  Component Value Date   CHOL 175 10/07/2010   Lab Results  Component Value Date   HDL 70.30 10/07/2010   Lab Results  Component Value Date   LDLCALC 86 10/07/2010   Lab  Results  Component Value Date   TRIG 96.0 10/07/2010   Lab Results  Component Value Date   CHOLHDL 2 10/07/2010     Assessment & Plan  Hip pain, bilateral Patient with long standing history of b/l hip pain has used Lidoderm Patches previously with good results, they helpt he most during the day when she is at work and needs to use something non sedating. She is given an rx for Lidoderm Patches 5% to apply  1 to each hip daily as needed  Fibromyalgia Previously confirmed by Rheumatology. Still with daily pain from first arising and continuing throughout the day. Improves some when she first gets moving and takes her am meds, only to worsen again later in the day. She may increase her Gabapentin to 4 tabs daily as tolerated, continue her Tramadol.  Fatigue Initially thought she had had a sleep study recently but it may have been more than a few years, she will go home and try and figure that out, we may need to proceed with a second study  Depression Agrees to add to Bupropion 100 mg daily.  Thyroid disease Labs show a mildly depressed tsh but normal t4 will continue to monitor  Anemia Resolved with recent blood draw

## 2010-11-15 NOTE — Assessment & Plan Note (Signed)
Labs show a mildly depressed tsh but normal t4 will continue to monitor

## 2010-11-15 NOTE — Assessment & Plan Note (Signed)
Agrees to add to Bupropion 100 mg daily.

## 2010-11-16 ENCOUNTER — Other Ambulatory Visit: Payer: Self-pay

## 2010-11-16 ENCOUNTER — Telehealth: Payer: Self-pay

## 2010-11-16 DIAGNOSIS — R51 Headache: Secondary | ICD-10-CM

## 2010-11-16 DIAGNOSIS — I10 Essential (primary) hypertension: Secondary | ICD-10-CM

## 2010-11-16 DIAGNOSIS — R5383 Other fatigue: Secondary | ICD-10-CM

## 2010-11-16 DIAGNOSIS — F329 Major depressive disorder, single episode, unspecified: Secondary | ICD-10-CM

## 2010-11-16 DIAGNOSIS — R0683 Snoring: Secondary | ICD-10-CM

## 2010-11-16 MED ORDER — DESVENLAFAXINE SUCCINATE ER 50 MG PO TB24: ORAL_TABLET | ORAL | Status: DC

## 2010-11-16 MED ORDER — LISINOPRIL 10 MG PO TABS: 10.0000 mg | ORAL_TABLET | Freq: Two times a day (BID) | ORAL | Status: DC

## 2010-11-16 NOTE — Telephone Encounter (Signed)
Yes patient would like to proceed with the sleep study and the Aeroflow at home would be good.

## 2010-11-17 NOTE — Telephone Encounter (Signed)
Referral form is on your desk :)

## 2010-11-23 ENCOUNTER — Ambulatory Visit
Admission: RE | Admit: 2010-11-23 | Discharge: 2010-11-23 | Disposition: A | Discharge status: Home / Self Care | Payer: BC Managed Care – PPO | Source: Ambulatory Visit | Attending: Chiropractic Medicine | Admitting: Chiropractic Medicine

## 2010-11-23 ENCOUNTER — Other Ambulatory Visit: Payer: Self-pay | Admitting: Chiropractic Medicine

## 2010-11-23 DIAGNOSIS — M545 Low back pain: Secondary | ICD-10-CM

## 2010-11-23 DIAGNOSIS — M5412 Radiculopathy, cervical region: Secondary | ICD-10-CM

## 2010-11-23 DIAGNOSIS — R202 Paresthesia of skin: Secondary | ICD-10-CM

## 2010-11-30 ENCOUNTER — Other Ambulatory Visit: Payer: Self-pay | Admitting: Family Medicine

## 2010-11-30 DIAGNOSIS — F329 Major depressive disorder, single episode, unspecified: Secondary | ICD-10-CM

## 2010-11-30 DIAGNOSIS — I10 Essential (primary) hypertension: Secondary | ICD-10-CM

## 2010-11-30 MED ORDER — DESVENLAFAXINE SUCCINATE ER 50 MG PO TB24: ORAL_TABLET | ORAL | Status: DC

## 2010-11-30 MED ORDER — LISINOPRIL 10 MG PO TABS: 10.0000 mg | ORAL_TABLET | Freq: Two times a day (BID) | ORAL | Status: DC

## 2010-11-30 NOTE — Telephone Encounter (Signed)
RXs refilled.

## 2010-12-11 ENCOUNTER — Ambulatory Visit: Payer: BC Managed Care – PPO | Admitting: Family Medicine

## 2010-12-16 ENCOUNTER — Other Ambulatory Visit: Payer: Self-pay

## 2010-12-16 DIAGNOSIS — F329 Major depressive disorder, single episode, unspecified: Secondary | ICD-10-CM

## 2010-12-16 NOTE — Telephone Encounter (Signed)
Left detailed message.   

## 2010-12-16 NOTE — Telephone Encounter (Signed)
Pt left a message stating that her Pristiq only had 30 pills in it? I left a message on Pts voicemail stating that we refilled the Pristiq on 11-30-10 2 daily quantity 180 with 1 refill

## 2010-12-18 ENCOUNTER — Telehealth: Payer: Self-pay

## 2010-12-18 NOTE — Telephone Encounter (Signed)
Patient is questioning [employer], does not know why doctor wants her to have the sleep study, please contact patient

## 2010-12-18 NOTE — Telephone Encounter (Signed)
So have [employer] send [place] the preliminary results for my review, so I can tell patient why she should proceed with further study

## 2010-12-18 NOTE — Telephone Encounter (Signed)
Please advise if you want patient to continue with the sleep study/

## 2010-12-18 NOTE — Telephone Encounter (Signed)
Please advise if you are wanting patient to continue with the sleep study?

## 2010-12-21 NOTE — Telephone Encounter (Signed)
Pt states its going to be $200 out of pocket. Pt states she is probably going to get it done after the holidays

## 2010-12-21 NOTE — Telephone Encounter (Signed)
understood

## 2010-12-21 NOTE — Telephone Encounter (Signed)
Left a message for patient to return my call. 

## 2010-12-28 ENCOUNTER — Telehealth: Payer: Self-pay | Admitting: Family Medicine

## 2010-12-28 MED ORDER — GABAPENTIN 300 MG PO CAPS: 300.0000 mg | ORAL_CAPSULE | ORAL | Status: DC

## 2010-12-28 NOTE — Telephone Encounter (Signed)
Patient request

## 2010-12-28 NOTE — Telephone Encounter (Signed)
Addended by: Mica Ramdass L on: 12/28/2010 04:36 PM   Modules accepted: Orders  

## 2011-01-07 ENCOUNTER — Ambulatory Visit: Payer: BC Managed Care – PPO | Admitting: Family Medicine

## 2011-01-08 ENCOUNTER — Encounter: Payer: Self-pay | Admitting: Family Medicine

## 2011-01-08 ENCOUNTER — Ambulatory Visit (INDEPENDENT_AMBULATORY_CARE_PROVIDER_SITE_OTHER): Payer: BC Managed Care – PPO | Admitting: Family Medicine

## 2011-01-08 DIAGNOSIS — R413 Other amnesia: Secondary | ICD-10-CM

## 2011-01-08 DIAGNOSIS — M797 Fibromyalgia: Secondary | ICD-10-CM

## 2011-01-08 DIAGNOSIS — F341 Dysthymic disorder: Secondary | ICD-10-CM

## 2011-01-08 DIAGNOSIS — F329 Major depressive disorder, single episode, unspecified: Secondary | ICD-10-CM

## 2011-01-08 DIAGNOSIS — IMO0001 Reserved for inherently not codable concepts without codable children: Secondary | ICD-10-CM

## 2011-01-08 DIAGNOSIS — G4700 Insomnia, unspecified: Secondary | ICD-10-CM

## 2011-01-08 DIAGNOSIS — I10 Essential (primary) hypertension: Secondary | ICD-10-CM

## 2011-01-08 DIAGNOSIS — F418 Other specified anxiety disorders: Secondary | ICD-10-CM

## 2011-01-08 DIAGNOSIS — R41840 Attention and concentration deficit: Secondary | ICD-10-CM

## 2011-01-08 MED ORDER — METHYLPHENIDATE HCL 10 MG PO TABS: 10.0000 mg | ORAL_TABLET | Freq: Two times a day (BID) | ORAL | Status: DC

## 2011-01-08 NOTE — Patient Instructions (Signed)
Attention Deficit Hyperactivity Disorder Attention deficit hyperactivity disorder (ADHD) is a problem with behavior issues based on the way the brain functions (neurobehavioral disorder). It is a common reason for behavior and academic problems in school. CAUSES  The cause of ADHD is unknown in most cases. It may run in families. It sometimes can be associated with learning disabilities and other behavioral problems. SYMPTOMS  There are 3 types of ADHD. The 3 types and some of the symptoms include:  Inattentive   Gets bored or distracted easily.   Loses or forgets things. Forgets to hand in homework.   Has trouble organizing or completing tasks.   Difficulty staying on task.   An inability to organize daily tasks and school work.   Leaving projects, chores, or homework unfinished.   Trouble paying attention or responding to details. Careless mistakes.   Difficulty following directions. Often seems like is not listening.   Dislikes activities that require sustained attention (like chores or homework).   Hyperactive-impulsive   Feels like it is impossible to sit still or stay in a seat. Fidgeting with hands and feet.   Trouble waiting turn.   Talking too much or out of turn. Interruptive.   Speaks or acts impulsively.   Aggressive, disruptive behavior.   Constantly busy or on the go, noisy.   Combined   Has symptoms of both of the above.  Often children with ADHD feel discouraged about themselves and with school. They often perform well below their abilities in school. These symptoms can cause problems in home, school, and in relationships with peers. As children get older, the excess motor activities can calm down, but the problems with paying attention and staying organized persist. Most children do not outgrow ADHD but with good treatment can learn to cope with the symptoms. DIAGNOSIS  When ADHD is suspected, the diagnosis should be made by professionals trained in  ADHD.  Diagnosis will include:  Ruling out other reasons for the child's behavior.   The caregivers will check with the child's school and check their medical records.   They will talk to teachers and parents.   Behavior rating scales for the child will be filled out by those dealing with the child on a daily basis.  A diagnosis is made only after all information has been considered. TREATMENT  Treatment usually includes behavioral treatment often along with medicines. It may include stimulant medicines. The stimulant medicines decrease impulsivity and hyperactivity and increase attention. Other medicines used include antidepressants and certain blood pressure medicines. Most experts agree that treatment for ADHD should address all aspects of the child's functioning. Treatment should not be limited to the use of medicines alone. Treatment should include structured classroom management. The parents must receive education to address rewarding good behavior, discipline, and limit-setting. Tutoring or behavioral therapy or both should be available for the child. If untreated, the disorder can have long-term serious effects into adolescence and adulthood. HOME CARE INSTRUCTIONS   Often with ADHD there is a lot of frustration among the family in dealing with the illness. There is often blame and anger that is not warranted. This is a life long illness. There is no way to prevent ADHD. In many cases, because the problem affects the family as a whole, the entire family may need help. A therapist can help the family find better ways to handle the disruptive behaviors and promote change. If the child is young, most of the therapist's work is with the parents. Parents will   learn techniques for coping with and improving their child's behavior. Sometimes only the child with the ADHD needs counseling. Your caregivers can help you make these decisions.   Children with ADHD may need help in organizing. Some  helpful tips include:   Keep routines the same every day from wake-up time to bedtime. Schedule everything. This includes homework and playtime. This should include outdoor and indoor recreation. Keep the schedule on the refrigerator or a bulletin board where it is frequently seen. Mark schedule changes as far in advance as possible.   Have a place for everything and keep everything in its place. This includes clothing, backpacks, and school supplies.   Encourage writing down assignments and bringing home needed books.   Offer your child a well-balanced diet. Breakfast is especially important for school performance. Children should avoid drinks with caffeine including:   Soft drinks.   Coffee.   Tea.   However, some older children (adolescents) may find these drinks helpful in improving their attention.   Children with ADHD need consistent rules that they can understand and follow. If rules are followed, give small rewards. Children with ADHD often receive, and expect, criticism. Look for good behavior and praise it. Set realistic goals. Give clear instructions. Look for activities that can foster success and self-esteem. Make time for pleasant activities with your child. Give lots of affection.   Parents are their children's greatest advocates. Learn as much as possible about ADHD. This helps you become a stronger and better advocate for your child. It also helps you educate your child's teachers and instructors if they feel inadequate in these areas. Parent support groups are often helpful. A national group with local chapters is called CHADD (Children and Adults with Attention Deficit Hyperactivity Disorder).  PROGNOSIS  There is no cure for ADHD. Children with the disorder seldom outgrow it. Many find adaptive ways to accommodate the ADHD as they mature. SEEK MEDICAL CARE IF:  Your child has repeated muscle twitches, cough or speech outbursts.   Your child has sleep problems.   Your  child has a marked loss of appetite.   Your child develops depression.   Your child has new or worsening behavioral problems.   Your child develops dizziness.   Your child has a racing heart.   Your child has stomach pains.   Your child develops headaches.  Document Released: 01/08/2002 Document Revised: 09/30/2010 Document Reviewed: 08/21/2007 [employer] Patient Information 2012 [employer]. 

## 2011-01-08 NOTE — Progress Notes (Signed)
Lauren Hall [id number] Jan 11, 1971 01/08/2011      Progress Note-Follow Up  Subjective  Chief Complaint  Chief Complaint  Patient presents with  . discuss medication    for focus    HPI  Patient is a 40 yo AA female in today to discuss fatigue and difficulty with memory. She is very frustrated with her ability to stay focused and get anything done. It is causing her a great deal of stress and anxiety. Her husband is traveling and not home much and she is having a lot to do. No recent illness, fever, chills, CP, palp, SOB, GI or GU c/o.  Past Medical History  Diagnosis Date  . Anxiety   . Fibromyalgia 2009  . Fibromyalgia   . Anemia   . Allergy   . Depression   . Hypertension   . Fatigue 10/08/2010  . Headache 10/08/2010  . Thyroid disease 10/08/2010  . Hip pain, bilateral 11/06/2010    Past Surgical History  Procedure Date  . Breast lumpectomy 1999    right- benign    Family History  Problem Relation Age of Onset  . Fibromyalgia Mother   . Diabetes Mother     type 2  . Hypertension Mother   . Hyperlipidemia Mother   . Alcohol abuse Father   . Cancer Father     colon  . Hypertension Brother   . Heart disease Maternal Grandmother   . Hyperlipidemia Maternal Grandmother   . Diabetes Maternal Grandmother     type 2  . Cancer Maternal Grandmother     colon  . Stroke Maternal Grandfather   . Diabetes Maternal Grandfather     type 2  . Cancer Paternal Grandmother     ovarian  . Alzheimer's disease Paternal Grandfather   . Alcohol abuse Brother     History   Social History  . Marital Status: Married    Spouse Name: N/A    Number of Children: N/A  . Years of Education: N/A   Occupational History  . Not on file.   Social History Main Topics  . Smoking status: Never Smoker   . Smokeless tobacco: Never Used  . Alcohol Use: Yes     special occassion- very rarely  . Drug Use: No  . Sexually Active: Yes -- Female partner(s)   Other Topics Concern  .  Not on file   Social History Narrative  . No narrative on file    Current Outpatient Prescriptions on File Prior to Visit  Medication Sig Dispense Refill  . Azelastine HCl (ASTEPRO) 0.15 % SOLN Place 2 sprays into the nose daily.        . Bepotastine Besilate (BEPREVE) 1.5 % SOLN Apply to eye.        Marland Kitchen buPROPion (WELLBUTRIN) 100 MG tablet 1 tab po qhs x 7 days and then increase to twice daily  60 tablet  2  . Cholecalciferol (VITAMIN D) 2000 UNITS CAPS Take 2,000 capsules by mouth 2 (two) times daily.        . clindamycin-benzoyl peroxide (BENZACLIN) gel Apply topically 2 (two) times daily.        Marland Kitchen desvenlafaxine (PRISTIQ) 50 MG 24 hr tablet 2 tabs po daily  180 tablet  1  . fish oil-omega-3 fatty acids 1000 MG capsule MegaRed 1 cap by mouth daily (by Schiff)  30 capsule  1  . gabapentin (NEURONTIN) 300 MG capsule Take 1 capsule (300 mg total) by mouth as directed. 1 in am,  2 in pm  90 capsule  3  . Levonorgestrel-Ethinyl Estradiol (CAMRESE) 0.15-0.03 &0.01 MG tablet Take 1 tablet by mouth daily.        Marland Kitchen lisinopril (PRINIVIL,ZESTRIL) 10 MG tablet Take 1 tablet (10 mg total) by mouth 2 (two) times daily.  180 tablet  1  . LORazepam (ATIVAN) 0.5 MG tablet Take 1 tablet (0.5 mg total) by mouth 2 (two) times daily as needed. For anxiety/ insomnia  30 tablet  1  . methylPREDNISolone (MEDROL) 4 MG tablet Take 4 mg by mouth daily.        . traMADol (ULTRAM) 50 MG tablet Take 1 tablet (50 mg total) by mouth as directed. 2 in am, 2 in pm  120 tablet  3  . Tretinoin-Cleanser-Moisturizer 0.025 % CREAM KIT Apply topically at bedtime.        . Vitamin D, Ergocalciferol, (DRISDOL) 50000 UNITS CAPS Take 50,000 Units by mouth every 14 (fourteen) days.        . Wheat Dextrin (BENEFIBER) POWD 2 tsp po daily to bid prn constipation, in 6-8 oz of fluid  1 Can  3    No Active Allergies  Review of Systems  Review of Systems  Constitutional: Positive for malaise/fatigue. Negative for fever.  HENT:  Negative for congestion.   Eyes: Negative for discharge.  Respiratory: Negative for shortness of breath.   Cardiovascular: Negative for chest pain, palpitations and leg swelling.  Gastrointestinal: Negative for nausea, abdominal pain and diarrhea.  Genitourinary: Negative for dysuria.  Musculoskeletal: Negative for falls.  Skin: Negative for rash.  Neurological: Negative for loss of consciousness and headaches.  Endo/Heme/Allergies: Negative for polydipsia.  Psychiatric/Behavioral: Positive for memory loss. Negative for depression and suicidal ideas. The patient is nervous/anxious. The patient does not have insomnia.        She feels like she needs at least two things going on at once in order to focus and to quell her anxiety. She is having more anxiety about not forgetting things.     Objective  BP 137/88  Pulse 93  Temp(Src) 97.4 F (36.3 C) (Oral)  Ht 4' 11.25" (1.505 m)  Wt 127 lb 12.8 oz (57.97 kg)  BMI 25.60 kg/m2  SpO2 94%  LMP 12/25/2010  Physical Exam  Physical Exam  Constitutional: She is oriented to person, place, and time and well-developed, well-nourished, and in no distress. No distress.  HENT:  Head: Normocephalic and atraumatic.  Eyes: Conjunctivae are normal.  Neck: Neck supple. No thyromegaly present.  Cardiovascular: Normal rate, regular rhythm and normal heart sounds.   No murmur heard. Pulmonary/Chest: Effort normal and breath sounds normal. She has no wheezes.  Abdominal: She exhibits no distension and no mass.  Musculoskeletal: She exhibits no edema.  Lymphadenopathy:    She has no cervical adenopathy.  Neurological: She is alert and oriented to person, place, and time.  Skin: Skin is warm and dry. No rash noted. She is not diaphoretic.  Psychiatric: Memory, affect and judgment normal.    Lab Results  Component Value Date   TSH 0.26* 10/07/2010   Lab Results  Component Value Date   WBC 7.8 10/04/2008   HGB 13.1 10/04/2008   HCT 39.2 10/04/2008    MCV 89.3 10/04/2008   PLT 343 10/04/2008   Lab Results  Component Value Date   CREATININE 0.6 10/07/2010   BUN 8 10/07/2010   NA 137 10/07/2010   K 3.9 10/07/2010   CL 104 10/07/2010   CO2 24 10/07/2010  Lab Results  Component Value Date   ALT 17 10/07/2010   AST 18 10/07/2010   ALKPHOS 43 10/07/2010   BILITOT 0.5 10/07/2010   Lab Results  Component Value Date   CHOL 175 10/07/2010   Lab Results  Component Value Date   HDL 70.30 10/07/2010   Lab Results  Component Value Date   LDLCALC 86 10/07/2010   Lab Results  Component Value Date   TRIG 96.0 10/07/2010   Lab Results  Component Value Date   CHOLHDL 2 10/07/2010     Assessment & Plan  Hypertension Adequately controlled at this time. No changes.  Fibromyalgia Continues to struggle with chronic fatigue, pain and poor concentration  Depression Discussed possible need for psychiatry referral if no improvement on Ritalin, patient agreeable.  Poor concentration Consider ADD vs Depression vs fibromyalgia related. Will try a trial of Ritalin bid

## 2011-01-11 ENCOUNTER — Encounter: Payer: Self-pay | Admitting: Family Medicine

## 2011-01-11 DIAGNOSIS — R41840 Attention and concentration deficit: Secondary | ICD-10-CM

## 2011-01-11 HISTORY — DX: Attention and concentration deficit: R41.840

## 2011-01-11 NOTE — Assessment & Plan Note (Signed)
Discussed possible need for psychiatry referral if no improvement on Ritalin, patient agreeable.

## 2011-01-11 NOTE — Assessment & Plan Note (Signed)
Continues to struggle with chronic fatigue, pain and poor concentration

## 2011-01-11 NOTE — Assessment & Plan Note (Signed)
Consider ADD vs Depression vs fibromyalgia related. Will try a trial of Ritalin bid

## 2011-01-11 NOTE — Assessment & Plan Note (Signed)
Adequately controlled at this time. No changes. 

## 2011-01-22 ENCOUNTER — Ambulatory Visit: Payer: BC Managed Care – PPO | Admitting: Family Medicine

## 2011-01-29 ENCOUNTER — Ambulatory Visit (INDEPENDENT_AMBULATORY_CARE_PROVIDER_SITE_OTHER): Payer: BC Managed Care – PPO | Admitting: Family Medicine

## 2011-01-29 DIAGNOSIS — R5381 Other malaise: Secondary | ICD-10-CM

## 2011-01-29 DIAGNOSIS — Z0289 Encounter for other administrative examinations: Secondary | ICD-10-CM

## 2011-01-29 NOTE — Patient Instructions (Signed)

## 2011-01-31 ENCOUNTER — Encounter: Payer: Self-pay | Admitting: Family Medicine

## 2011-02-01 NOTE — Progress Notes (Signed)
Patient ID: Lauren Hall, female   DOB: 1970-07-31, 40 y.o.   MRN: [id number] Patient did not show up for her appt

## 2011-02-19 ENCOUNTER — Other Ambulatory Visit: Payer: Self-pay | Admitting: Family Medicine

## 2011-02-22 ENCOUNTER — Other Ambulatory Visit: Payer: Self-pay

## 2011-02-22 MED ORDER — GABAPENTIN 300 MG PO CAPS: 300.0000 mg | ORAL_CAPSULE | ORAL | Status: DC

## 2011-02-23 ENCOUNTER — Ambulatory Visit: Payer: BC Managed Care – PPO | Admitting: Family Medicine

## 2011-02-26 ENCOUNTER — Ambulatory Visit: Payer: BC Managed Care – PPO | Admitting: Family Medicine

## 2011-03-02 ENCOUNTER — Encounter: Payer: Self-pay | Admitting: Family Medicine

## 2011-03-02 ENCOUNTER — Ambulatory Visit: Payer: BC Managed Care – PPO | Admitting: Licensed Clinical Social Worker

## 2011-03-02 ENCOUNTER — Ambulatory Visit (INDEPENDENT_AMBULATORY_CARE_PROVIDER_SITE_OTHER): Payer: BC Managed Care – PPO | Admitting: Family Medicine

## 2011-03-02 DIAGNOSIS — R5383 Other fatigue: Secondary | ICD-10-CM

## 2011-03-02 DIAGNOSIS — I10 Essential (primary) hypertension: Secondary | ICD-10-CM

## 2011-03-02 DIAGNOSIS — R5381 Other malaise: Secondary | ICD-10-CM

## 2011-03-02 DIAGNOSIS — R41840 Attention and concentration deficit: Secondary | ICD-10-CM

## 2011-03-02 DIAGNOSIS — F329 Major depressive disorder, single episode, unspecified: Secondary | ICD-10-CM

## 2011-03-02 DIAGNOSIS — E079 Disorder of thyroid, unspecified: Secondary | ICD-10-CM

## 2011-03-02 DIAGNOSIS — R413 Other amnesia: Secondary | ICD-10-CM

## 2011-03-02 DIAGNOSIS — R000 Tachycardia, unspecified: Secondary | ICD-10-CM

## 2011-03-02 DIAGNOSIS — F32A Depression, unspecified: Secondary | ICD-10-CM

## 2011-03-02 LAB — RENAL FUNCTION PANEL
Glucose, Bld: 86 mg/dL (ref 70–99)
Phosphorus: 3.5 mg/dL (ref 2.3–4.6)
Potassium: 3.9 mEq/L (ref 3.5–5.1)
Sodium: 138 mEq/L (ref 135–145)

## 2011-03-02 LAB — CBC
MCHC: 33.7 g/dL (ref 30.0–36.0)
MCV: 93.9 fl (ref 78.0–100.0)
RDW: 13.9 % (ref 11.5–14.6)

## 2011-03-02 LAB — TSH: TSH: 0.35 u[IU]/mL (ref 0.35–5.50)

## 2011-03-02 LAB — T4, FREE: Free T4: 0.71 ng/dL (ref 0.60–1.60)

## 2011-03-02 MED ORDER — NEBIVOLOL HCL 5 MG PO TABS: 2.5000 mg | ORAL_TABLET | Freq: Every day | ORAL | Status: DC

## 2011-03-02 MED ORDER — NEBIVOLOL HCL 5 MG PO TABS: 5.0000 mg | ORAL_TABLET | Freq: Every day | ORAL | Status: DC

## 2011-03-02 MED ORDER — METHYLPHENIDATE HCL 10 MG PO TABS: 10.0000 mg | ORAL_TABLET | ORAL | Status: DC

## 2011-03-02 MED ORDER — METHYLPHENIDATE HCL ER (LA) 20 MG PO CP24: 20.0000 mg | ORAL_CAPSULE | ORAL | Status: DC

## 2011-03-02 NOTE — Assessment & Plan Note (Signed)
BP acceptable today but Hi normal will start Bystolic and reassess at next visit.

## 2011-03-02 NOTE — Assessment & Plan Note (Signed)
Patient reports a long history of tachycardia, when she checks it at home it is often in the 120s at rest, she had been in the 90s here for the past 3 visits, will start Bystolic 5 mg tabs 1/2 tab daily. Reassess next month, samples given

## 2011-03-02 NOTE — Progress Notes (Signed)
Patient ID: Lauren Hall, female   DOB: 24-Sep-1970, 41 y.o.   MRN: [id number] Lauren Hall [id number] 30-Aug-1970 03/02/2011      Progress Note-Follow Up  Subjective  Chief Complaint  Chief Complaint  Patient presents with  . Follow-up    2 week follow up    HPI  Patient is a 41 year old [place] American female in today for routine followup. She will start over the last month for poor concentration and persistent fatigue secondary fibromyalgia and depression. She felt the Ritalin helped for a couple of hours. She states her focus to have more energy especially at work. On the first day she took it she thought she felt slightly more tachycardia but by day 2 he been back to her baseline. She has a long history of tachycardia and she says often when she checks it at home it is as high as 120s. She denies any chest pain, shortness of breath, fevers, acute illness, GI or GU complaints. She was recently scoped with a painful death of aunt she was very close to and so has been under great deal of stress but otherwise feels she's done well. She has agreed to set up counseling but has not proceeded with that so far. She is concerned that she feels hot when others feel cold as well  Past Medical History  Diagnosis Date  . Anxiety   . Fibromyalgia 2009  . Fibromyalgia   . Anemia   . Allergy   . Depression   . Hypertension   . Fatigue 10/08/2010  . Headache 10/08/2010  . Thyroid disease 10/08/2010  . Hip pain, bilateral 11/06/2010  . Poor concentration 01/11/2011    Past Surgical History  Procedure Date  . Breast lumpectomy 1999    right- benign    Family History  Problem Relation Age of Onset  . Fibromyalgia Mother   . Diabetes Mother     type 2  . Hypertension Mother   . Hyperlipidemia Mother   . Alcohol abuse Father   . Cancer Father     colon  . Hypertension Brother   . Heart disease Maternal Grandmother   . Hyperlipidemia Maternal Grandmother   . Diabetes Maternal  Grandmother     type 2  . Cancer Maternal Grandmother     colon  . Stroke Maternal Grandfather   . Diabetes Maternal Grandfather     type 2  . Cancer Paternal Grandmother     ovarian  . Alzheimer's disease Paternal Grandfather   . Alcohol abuse Brother     History   Social History  . Marital Status: Married    Spouse Name: N/A    Number of Children: N/A  . Years of Education: N/A   Occupational History  . Not on file.   Social History Main Topics  . Smoking status: Never Smoker   . Smokeless tobacco: Never Used  . Alcohol Use: Yes     special occassion- very rarely  . Drug Use: No  . Sexually Active: Yes -- Female partner(s)   Other Topics Concern  . Not on file   Social History Narrative  . No narrative on file    Current Outpatient Prescriptions on File Prior to Visit  Medication Sig Dispense Refill  . buPROPion (WELLBUTRIN) 100 MG tablet TAKE 1 TABLET AT BEDTIME X 7 DAYS AND THEN INCREASE TO TWICE DAILY  60 tablet  2  . Cholecalciferol (VITAMIN D) 2000 UNITS CAPS Take 2,000 capsules by mouth 2 (two)  times daily.        . clindamycin-benzoyl peroxide (BENZACLIN) gel Apply topically 2 (two) times daily.        Marland Kitchen desvenlafaxine (PRISTIQ) 50 MG 24 hr tablet 2 tabs po daily  180 tablet  1  . fish oil-omega-3 fatty acids 1000 MG capsule MegaRed 1 cap by mouth daily (by Schiff)  30 capsule  1  . gabapentin (NEURONTIN) 300 MG capsule Take 1 capsule (300 mg total) by mouth as directed. 1 in am, 2 in pm  90 capsule  2  . Levonorgestrel-Ethinyl Estradiol (CAMRESE) 0.15-0.03 &0.01 MG tablet Take 1 tablet by mouth daily.        Marland Kitchen lisinopril (PRINIVIL,ZESTRIL) 10 MG tablet Take 1 tablet (10 mg total) by mouth 2 (two) times daily.  180 tablet  1  . LORazepam (ATIVAN) 0.5 MG tablet Take 1 tablet (0.5 mg total) by mouth 2 (two) times daily as needed. For anxiety/ insomnia  30 tablet  1  . traMADol (ULTRAM) 50 MG tablet Take 1 tablet (50 mg total) by mouth as directed. 2 in am, 2 in  pm  120 tablet  3  . Tretinoin-Cleanser-Moisturizer 0.025 % CREAM KIT Apply topically at bedtime.        . Wheat Dextrin (BENEFIBER) POWD 2 tsp po daily to bid prn constipation, in 6-8 oz of fluid  1 Can  3  . Azelastine HCl (ASTEPRO) 0.15 % SOLN Place 2 sprays into the nose daily.        . Bepotastine Besilate (BEPREVE) 1.5 % SOLN Apply to eye.          No Known Allergies  Review of Systems  Review of Systems  Constitutional: Positive for malaise/fatigue. Negative for fever.  HENT: Negative for congestion.   Eyes: Negative for discharge.  Respiratory: Negative for shortness of breath.   Cardiovascular: Positive for palpitations. Negative for chest pain and leg swelling.  Gastrointestinal: Negative for nausea, abdominal pain and diarrhea.  Genitourinary: Negative for dysuria.  Musculoskeletal: Negative for falls.  Skin: Negative for rash.  Neurological: Negative for loss of consciousness and headaches.  Endo/Heme/Allergies: Negative for polydipsia.  Psychiatric/Behavioral: Negative for depression and suicidal ideas. The patient is not nervous/anxious and does not have insomnia.     Objective  BP 127/86  Pulse 95  Temp(Src) 98 F (36.7 C) (Temporal)  Ht 4' 11.25" (1.505 m)  Wt 125 lb (56.7 kg)  BMI 25.03 kg/m2  SpO2 96%  LMP 12/31/2010  Physical Exam  Physical Exam  Constitutional: She is oriented to person, place, and time and well-developed, well-nourished, and in no distress. No distress.  HENT:  Head: Normocephalic and atraumatic.  Eyes: Conjunctivae are normal.  Neck: Neck supple. No thyromegaly present.  Cardiovascular: Normal rate, regular rhythm and normal heart sounds.   No murmur heard. Pulmonary/Chest: Effort normal and breath sounds normal. She has no wheezes.  Abdominal: She exhibits no distension and no mass.  Musculoskeletal: She exhibits no edema.  Lymphadenopathy:    She has no cervical adenopathy.  Neurological: She is alert and oriented to  person, place, and time.  Skin: Skin is warm and dry. No rash noted. She is not diaphoretic.  Psychiatric: Memory, affect and judgment normal.    Lab Results  Component Value Date   TSH 0.26* 10/07/2010   Lab Results  Component Value Date   WBC 7.8 10/04/2008   HGB 13.1 10/04/2008   HCT 39.2 10/04/2008   MCV 89.3 10/04/2008   PLT 343  10/04/2008   Lab Results  Component Value Date   CREATININE 0.6 10/07/2010   BUN 8 10/07/2010   NA 137 10/07/2010   K 3.9 10/07/2010   CL 104 10/07/2010   CO2 24 10/07/2010   Lab Results  Component Value Date   ALT 17 10/07/2010   AST 18 10/07/2010   ALKPHOS 43 10/07/2010   BILITOT 0.5 10/07/2010   Lab Results  Component Value Date   CHOL 175 10/07/2010   Lab Results  Component Value Date   HDL 70.30 10/07/2010   Lab Results  Component Value Date   LDLCALC 86 10/07/2010   Lab Results  Component Value Date   TRIG 96.0 10/07/2010   Lab Results  Component Value Date   CHOLHDL 2 10/07/2010     Assessment & Plan  Tachycardia Patient reports a long history of tachycardia, when she checks it at home it is often in the 120s at rest, she had been in the 90s here for the past 3 visits, will start Bystolic 5 mg tabs 1/2 tab daily. Reassess next month, samples given  Thyroid disease T4 normal but TSH low when last checked repeat labs today   Poor concentration Patient felt she had a partial response to the Ritalin 10mg  and it did help her focus and accomplish some tasks at work. It wore off quickly. Will try to place her on the extended release version at 20 mg in am and may continue a prn dose of Ritalin 10mg  short acting in the afternoons as needed. She will report any concerning side effects and we will reassess in 3 weeks or as needed.  Hypertension BP acceptable today but Hi normal will start Bystolic and reassess at next visit.  Depression Patient has just struggled through a painful death with a 59 yo aunt who had a painful Shingles outbreak while actively dying  from MM. She feels that Pristiq helped her manage but she is still agreeing to counseling and is going to call to set that up.

## 2011-03-02 NOTE — Assessment & Plan Note (Signed)
Patient has just struggled through a painful death with a 43 yo aunt who had a painful Shingles outbreak while actively dying from MM. She feels that Pristiq helped her manage but she is still agreeing to counseling and is going to call to set that up.

## 2011-03-02 NOTE — Patient Instructions (Signed)
Tachycardia, Nonspecific In adults, the heart normally beats between 60 and 100 times a minute. A heart rate over 100 is called tachycardia. When your heart beats too fast, it may not be able to pump enough blood to the rest of the body. CAUSES   Exercise or exertion.   Fever.   Pain or injury.   Infection.   Loss of fluid (dehydration).   Overactive thyroid.   Lack of red blood cells (anemia).   Anxiety.   Alcohol.   Heart arrhythmia.   Caffeine.   Tobacco products.   Diet pills.   Street drugs.   Heart disease.  SYMPTOMS  Palpitations (rapid or irregular heartbeat).   Dizziness.   Tiredness (fatigue).   Shortness of breath.  DIAGNOSIS  After an exam and taking a history, your caregiver may order:  Blood tests.   Electrocardiogram (EKG).   Heart monitor.  TREATMENT  Treatment will depend on the cause and potential for harm. It may include:  Intravenous (IV) replacement of fluids or blood.   Antidote or reversal medicines.   Changes in your present medicines.   Lifestyle changes.  HOME CARE INSTRUCTIONS   Get rest.   Drink enough water and fluids to keep your urine clear or pale yellow.   Avoid:   Caffeine.   Nicotine.   Alcohol.   Stress.   Chocolate.   Stimulants.   Only take medicine as directed by your caregiver.  SEEK IMMEDIATE MEDICAL CARE IF:   You have pain in your chest, upper arms, jaw, or neck.   You become weak, dizzy, or feel faint.   You have palpitations that will not go away.   You throw up (vomit), have diarrhea, or pass blood.   You look pale and your skin is cool and wet.  MAKE SURE YOU:   Understand these instructions.   Will watch your condition.   Will get help right away if you are not doing well or get worse.  Document Released: 02/26/2004 Document Revised: 09/30/2010 Document Reviewed: 01/18/2005 Exit[employer] Patient Information 2012 [employer]. 

## 2011-03-02 NOTE — Assessment & Plan Note (Signed)
T4 normal but TSH low when last checked repeat labs today

## 2011-03-02 NOTE — Assessment & Plan Note (Signed)
Patient felt she had a partial response to the Ritalin 10mg  and it did help her focus and accomplish some tasks at work. It wore off quickly. Will try to place her on the extended release version at 20 mg in am and may continue a prn dose of Ritalin 10mg  short acting in the afternoons as needed. She will report any concerning side effects and we will reassess in 3 weeks or as needed.

## 2011-03-11 ENCOUNTER — Ambulatory Visit (INDEPENDENT_AMBULATORY_CARE_PROVIDER_SITE_OTHER): Payer: BC Managed Care – PPO | Admitting: Licensed Clinical Social Worker

## 2011-03-11 DIAGNOSIS — F331 Major depressive disorder, recurrent, moderate: Secondary | ICD-10-CM

## 2011-03-11 DIAGNOSIS — F411 Generalized anxiety disorder: Secondary | ICD-10-CM

## 2011-03-23 ENCOUNTER — Ambulatory Visit (INDEPENDENT_AMBULATORY_CARE_PROVIDER_SITE_OTHER): Payer: BC Managed Care – PPO | Admitting: Licensed Clinical Social Worker

## 2011-03-23 DIAGNOSIS — F331 Major depressive disorder, recurrent, moderate: Secondary | ICD-10-CM

## 2011-03-23 DIAGNOSIS — F411 Generalized anxiety disorder: Secondary | ICD-10-CM

## 2011-04-02 ENCOUNTER — Encounter: Payer: Self-pay | Admitting: Family Medicine

## 2011-04-02 ENCOUNTER — Ambulatory Visit (INDEPENDENT_AMBULATORY_CARE_PROVIDER_SITE_OTHER): Payer: BC Managed Care – PPO | Admitting: Family Medicine

## 2011-04-02 DIAGNOSIS — IMO0001 Reserved for inherently not codable concepts without codable children: Secondary | ICD-10-CM

## 2011-04-02 DIAGNOSIS — F329 Major depressive disorder, single episode, unspecified: Secondary | ICD-10-CM

## 2011-04-02 DIAGNOSIS — M797 Fibromyalgia: Secondary | ICD-10-CM

## 2011-04-02 DIAGNOSIS — R41840 Attention and concentration deficit: Secondary | ICD-10-CM

## 2011-04-02 DIAGNOSIS — I10 Essential (primary) hypertension: Secondary | ICD-10-CM

## 2011-04-02 MED ORDER — METHYLPHENIDATE HCL ER (LA) 20 MG PO CP24: 20.0000 mg | ORAL_CAPSULE | ORAL | Status: DC

## 2011-04-02 MED ORDER — METHYLPHENIDATE HCL 10 MG PO TABS: 10.0000 mg | ORAL_TABLET | ORAL | Status: DC

## 2011-04-02 NOTE — Patient Instructions (Signed)

## 2011-04-04 NOTE — Assessment & Plan Note (Signed)
Continues with counseling and her counsellor called the other to suggest that she might do well with pain management. They recommended Dr Vear Clock since he historically has been willing to work with fibromyalgia. She agrees to referral.

## 2011-04-04 NOTE — Assessment & Plan Note (Signed)
Patient feels her current mix of long acting Ritalin in am and short acting Ritalin in afternoon, since dose is stable will allow some refills today

## 2011-04-04 NOTE — Assessment & Plan Note (Signed)
Patient sturggles with daily pain, feels the Tramadol helps her get through her day.

## 2011-04-04 NOTE — Assessment & Plan Note (Signed)
Mild elevation today, encouraged to avoid sodium and we will continue to monitor

## 2011-04-04 NOTE — Progress Notes (Signed)
Patient ID: Lauren Hall, female   DOB: Aug 24, 1970, 41 y.o.   MRN: [id number] Lauren Hall [id number] 1970-03-19 04/04/2011      Progress Note-Follow Up  Subjective  Chief Complaint  Chief Complaint  Patient presents with  . Follow-up    1 month follow-up  . Medication Refill    renew 2 doses of Ritalin    HPI  Patient is a 41 year old [place] American female who is in today for followup. She is continuing to trouble with chronic diffuse pain and depression. She is following with a counselor who called last week and recommended she try referral to pain management for management of her fibromyalgia. Her chronic pain is the major triggering factor to her decreased mood. She's not had any recent acute illness or change in her symptoms of persistent discomfort. Denies chest pain, palpitations, shortness of breath, fevers chills, GI or GU complaints. She has been trying to stay active but has not been exercising  Past Medical History  Diagnosis Date  . Anxiety   . Fibromyalgia 2009  . Fibromyalgia   . Anemia   . Allergy   . Depression   . Hypertension   . Fatigue 10/08/2010  . Headache 10/08/2010  . Thyroid disease 10/08/2010  . Hip pain, bilateral 11/06/2010  . Poor concentration 01/11/2011    Past Surgical History  Procedure Date  . Breast lumpectomy 1999    right- benign    Family History  Problem Relation Age of Onset  . Fibromyalgia Mother   . Diabetes Mother     type 2  . Hypertension Mother   . Hyperlipidemia Mother   . Alcohol abuse Father   . Cancer Father     colon  . Hypertension Brother   . Heart disease Maternal Grandmother   . Hyperlipidemia Maternal Grandmother   . Diabetes Maternal Grandmother     type 2  . Cancer Maternal Grandmother     colon  . Stroke Maternal Grandfather   . Diabetes Maternal Grandfather     type 2  . Cancer Paternal Grandmother     ovarian  . Alzheimer's disease Paternal Grandfather   . Alcohol abuse Brother      History   Social History  . Marital Status: Married    Spouse Name: N/A    Number of Children: N/A  . Years of Education: N/A   Occupational History  . Not on file.   Social History Main Topics  . Smoking status: Never Smoker   . Smokeless tobacco: Never Used  . Alcohol Use: Yes     special occassion- very rarely  . Drug Use: No  . Sexually Active: Yes -- Female partner(s)   Other Topics Concern  . Not on file   Social History Narrative  . No narrative on file    Current Outpatient Prescriptions on File Prior to Visit  Medication Sig Dispense Refill  . buPROPion (WELLBUTRIN) 100 MG tablet TAKE 1 TABLET AT BEDTIME X 7 DAYS AND THEN INCREASE TO TWICE DAILY  60 tablet  2  . Cholecalciferol (VITAMIN D) 2000 UNITS CAPS Take 2,000 capsules by mouth 2 (two) times daily.        . clindamycin-benzoyl peroxide (BENZACLIN) gel Apply topically 2 (two) times daily.        . fish oil-omega-3 fatty acids 1000 MG capsule MegaRed 1 cap by mouth daily (by Schiff)  30 capsule  1  . gabapentin (NEURONTIN) 300 MG capsule Take 1 capsule (300 mg  total) by mouth as directed. 1 in am, 2 in pm  90 capsule  2  . Levonorgestrel-Ethinyl Estradiol (CAMRESE) 0.15-0.03 &0.01 MG tablet Take 1 tablet by mouth daily.        Marland Kitchen LIDODERM 5 %       . lisinopril (PRINIVIL,ZESTRIL) 10 MG tablet Take 1 tablet (10 mg total) by mouth 2 (two) times daily.  180 tablet  1  . LORazepam (ATIVAN) 0.5 MG tablet Take 1 tablet (0.5 mg total) by mouth 2 (two) times daily as needed. For anxiety/ insomnia  30 tablet  1  . nebivolol (BYSTOLIC) 5 MG tablet Take 0.5 tablets (2.5 mg total) by mouth daily.  30 tablet    . traMADol (ULTRAM) 50 MG tablet Take 1 tablet (50 mg total) by mouth as directed. 2 in am, 2 in pm  120 tablet  3  . Tretinoin-Cleanser-Moisturizer 0.025 % CREAM KIT Apply topically at bedtime.        . Wheat Dextrin (BENEFIBER) POWD 2 tsp po daily to bid prn constipation, in 6-8 oz of fluid  1 Can  3  . Azelastine  HCl (ASTEPRO) 0.15 % SOLN Place 2 sprays into the nose daily.        . Bepotastine Besilate (BEPREVE) 1.5 % SOLN Apply to eye.          No Known Allergies  Review of Systems  Review of Systems  Constitutional: Positive for malaise/fatigue. Negative for fever.  HENT: Negative for congestion.   Eyes: Negative for discharge.  Respiratory: Negative for shortness of breath.   Cardiovascular: Negative for chest pain, palpitations and leg swelling.  Gastrointestinal: Negative for nausea, abdominal pain and diarrhea.  Genitourinary: Negative for dysuria.  Musculoskeletal: Positive for myalgias, back pain and joint pain. Negative for falls.  Skin: Negative for rash.  Neurological: Negative for loss of consciousness and headaches.  Endo/Heme/Allergies: Negative for polydipsia.  Psychiatric/Behavioral: Positive for depression. Negative for suicidal ideas. The patient is nervous/anxious. The patient does not have insomnia.     Objective  BP 148/80  Physical Exam  Physical Exam  Constitutional: She is oriented to person, place, and time and well-developed, well-nourished, and in no distress. No distress.  HENT:  Head: Normocephalic and atraumatic.  Eyes: Conjunctivae are normal.  Neck: Neck supple. No thyromegaly present.  Cardiovascular: Normal rate, regular rhythm and normal heart sounds.   No murmur heard. Pulmonary/Chest: Effort normal and breath sounds normal. She has no wheezes.  Abdominal: She exhibits no distension and no mass.  Musculoskeletal: She exhibits no edema.  Lymphadenopathy:    She has no cervical adenopathy.  Neurological: She is alert and oriented to person, place, and time.  Skin: Skin is warm and dry. No rash noted. She is not diaphoretic.  Psychiatric: Memory, affect and judgment normal.    Lab Results  Component Value Date   TSH 0.35 03/02/2011   Lab Results  Component Value Date   WBC 5.4 03/02/2011   HGB 13.2 03/02/2011   HCT 39.3 03/02/2011   MCV  93.9 03/02/2011   PLT 330.0 03/02/2011   Lab Results  Component Value Date   CREATININE 0.7 03/02/2011   BUN 5* 03/02/2011   NA 138 03/02/2011   K 3.9 03/02/2011   CL 102 03/02/2011   CO2 29 03/02/2011   Lab Results  Component Value Date   ALT 17 10/07/2010   AST 18 10/07/2010   ALKPHOS 43 10/07/2010   BILITOT 0.5 10/07/2010   Lab Results  Component Value Date   CHOL 175 10/07/2010   Lab Results  Component Value Date   HDL 70.30 10/07/2010   Lab Results  Component Value Date   LDLCALC 86 10/07/2010   Lab Results  Component Value Date   TRIG 96.0 10/07/2010   Lab Results  Component Value Date   CHOLHDL 2 10/07/2010     Assessment & Plan  Poor concentration Patient feels her current mix of long acting Ritalin in am and short acting Ritalin in afternoon, since dose is stable will allow some refills today  Hypertension Mild elevation today, encouraged to avoid sodium and we will continue to monitor  Depression Continues with counseling and her counsellor called the other to suggest that she might do well with pain management. They recommended Dr Vear Clock since he historically has been willing to work with fibromyalgia. She agrees to referral.  Fibromyalgia Patient sturggles with daily pain, feels the Tramadol helps her get through her day.

## 2011-04-05 ENCOUNTER — Other Ambulatory Visit: Payer: Self-pay

## 2011-04-05 MED ORDER — BUPROPION HCL 100 MG PO TABS: 100.0000 mg | ORAL_TABLET | Freq: Two times a day (BID) | ORAL | Status: DC

## 2011-04-05 NOTE — Telephone Encounter (Signed)
Received a rejection paper from [facility] for pt Bupropion. Insurance rejected 30 day supply needs to be 90. I will send new RX.

## 2011-04-06 ENCOUNTER — Ambulatory Visit: Payer: BC Managed Care – PPO | Admitting: Licensed Clinical Social Worker

## 2011-04-14 ENCOUNTER — Ambulatory Visit: Payer: BC Managed Care – PPO | Admitting: Licensed Clinical Social Worker

## 2011-05-03 ENCOUNTER — Other Ambulatory Visit: Payer: Self-pay | Admitting: Family Medicine

## 2011-05-31 ENCOUNTER — Encounter: Payer: Self-pay | Admitting: Family Medicine

## 2011-05-31 ENCOUNTER — Ambulatory Visit (INDEPENDENT_AMBULATORY_CARE_PROVIDER_SITE_OTHER): Payer: BC Managed Care – PPO | Admitting: Family Medicine

## 2011-05-31 VITALS — BP 129/86 | HR 110 | Temp 98.4°F | Ht 59.25 in | Wt 125.1 lb

## 2011-05-31 DIAGNOSIS — M25552 Pain in left hip: Secondary | ICD-10-CM

## 2011-05-31 DIAGNOSIS — IMO0001 Reserved for inherently not codable concepts without codable children: Secondary | ICD-10-CM

## 2011-05-31 DIAGNOSIS — M797 Fibromyalgia: Secondary | ICD-10-CM

## 2011-05-31 DIAGNOSIS — T7840XA Allergy, unspecified, initial encounter: Secondary | ICD-10-CM

## 2011-05-31 DIAGNOSIS — M25559 Pain in unspecified hip: Secondary | ICD-10-CM

## 2011-05-31 DIAGNOSIS — F329 Major depressive disorder, single episode, unspecified: Secondary | ICD-10-CM

## 2011-05-31 DIAGNOSIS — I10 Essential (primary) hypertension: Secondary | ICD-10-CM

## 2011-05-31 DIAGNOSIS — R41840 Attention and concentration deficit: Secondary | ICD-10-CM

## 2011-05-31 DIAGNOSIS — M542 Cervicalgia: Secondary | ICD-10-CM

## 2011-05-31 MED ORDER — RITALIN LA 30 MG PO CP24: 30.0000 mg | ORAL_CAPSULE | ORAL | Status: DC

## 2011-05-31 MED ORDER — BUPROPION HCL 100 MG PO TABS: 100.0000 mg | ORAL_TABLET | Freq: Two times a day (BID) | ORAL | Status: DC

## 2011-05-31 MED ORDER — TRAMADOL HCL 50 MG PO TABS: 50.0000 mg | ORAL_TABLET | ORAL | Status: DC

## 2011-05-31 MED ORDER — METAXALONE 800 MG PO TABS: 800.0000 mg | ORAL_TABLET | Freq: Three times a day (TID) | ORAL | Status: AC | PRN

## 2011-05-31 MED ORDER — DESVENLAFAXINE SUCCINATE ER 100 MG PO TB24: 100.0000 mg | ORAL_TABLET | Freq: Every day | ORAL | Status: DC

## 2011-05-31 NOTE — Patient Instructions (Signed)

## 2011-06-03 ENCOUNTER — Encounter: Payer: Self-pay | Admitting: Family Medicine

## 2011-06-03 NOTE — Assessment & Plan Note (Signed)
Continues to struggle with chronic daily pain. Has used Skelaxin in past with good response, baclofen did not work and Flexeril caused significant sedation. Will rx Skelaxin, has already been referred to pain management for further help

## 2011-06-03 NOTE — Progress Notes (Signed)
Patient ID: Lauren Hall, female   DOB: 11-24-1970, 41 y.o.   MRN: [id number] Lauren Hall [id number] [birth year] 06/03/2011      Progress Note-Follow Up  Subjective  Chief Complaint  Chief Complaint  Patient presents with  . Follow-up    1 month  . insurance form signed and faxed    HPI  Patient is a 41 year old American female who is in today for followup. She continues to struggle with chronic pain. She reports is diffuse musculoskeletal back and joints. She is awaiting her referral to pain management. She notes she she describes in the past when her symptoms got bad and that was helpful. Her allergies recently flared. She is having a lot more sinus pressure and headaches. She's been seeing Dr. Earlene Plater of chiropractor for her neck pain this past week and does get some minimal relief. No fevers, chills, chest pain, palpitations, shortness of breath, GI or GU complaints.  Past Medical History  Diagnosis Date  . Anxiety   . Fibromyalgia 2009  . Fibromyalgia   . Anemia   . Allergy   . Depression   . Hypertension   . Fatigue 10/08/2010  . Headache 10/08/2010  . Thyroid disease 10/08/2010  . Hip pain, bilateral 11/06/2010  . Poor concentration 01/11/2011    Past Surgical History  Procedure Date  . Breast lumpectomy 1999    right- benign    Family History  Problem Relation Age of Onset  . Fibromyalgia Mother   . Diabetes Mother     type 2  . Hypertension Mother   . Hyperlipidemia Mother   . Alcohol abuse Father   . Cancer Father     colon  . Hypertension Brother   . Heart disease Maternal Grandmother   . Hyperlipidemia Maternal Grandmother   . Diabetes Maternal Grandmother     type 2  . Cancer Maternal Grandmother     colon  . Stroke Maternal Grandfather   . Diabetes Maternal Grandfather     type 2  . Cancer Paternal Grandmother     ovarian  . Alzheimer's disease Paternal Grandfather   . Alcohol abuse Brother     History   Social History  . Marital  Status: Married    Spouse Name: N/A    Number of Children: N/A  . Years of Education: N/A   Occupational History  . Not on file.   Social History Main Topics  . Smoking status: Never Smoker   . Smokeless tobacco: Never Used  . Alcohol Use: Yes     special occassion- very rarely  . Drug Use: No  . Sexually Active: Yes -- Female partner(s)   Other Topics Concern  . Not on file   Social History Narrative  . No narrative on file    Current Outpatient Prescriptions on File Prior to Visit  Medication Sig Dispense Refill  . Azelastine HCl (ASTEPRO) 0.15 % SOLN Place 2 sprays into the nose daily.        . Bepotastine Besilate (BEPREVE) 1.5 % SOLN Apply to eye.        Marland Kitchen buPROPion (WELLBUTRIN) 100 MG tablet Take 1 tablet (100 mg total) by mouth 2 (two) times daily.  180 tablet  3  . Cholecalciferol (VITAMIN D) 2000 UNITS CAPS Take 2,000 capsules by mouth 2 (two) times daily.        Marland Kitchen desvenlafaxine (PRISTIQ) 100 MG 24 hr tablet Take 1 tablet (100 mg total) by mouth daily.  90 tablet  3  . fish oil-omega-3 fatty acids 1000 MG capsule MegaRed 1 cap by mouth daily (by Schiff)  30 capsule  1  . gabapentin (NEURONTIN) 300 MG capsule TAKE 1 CAPSULE (300 MG TOTAL) BY MOUTH AS DIRECTED. 1 IN AM, 2 IN PM  270 capsule  0  . LIDODERM 5 %       . lisinopril (PRINIVIL,ZESTRIL) 10 MG tablet Take 1 tablet (10 mg total) by mouth 2 (two) times daily.  180 tablet  1  . LORazepam (ATIVAN) 0.5 MG tablet Take 1 tablet (0.5 mg total) by mouth 2 (two) times daily as needed. For anxiety/ insomnia  30 tablet  1  . methylphenidate (RITALIN) 10 MG tablet Take 1 tablet (10 mg total) by mouth 1 day or 1 dose. In pm April 2013 rx  30 tablet  0  . nebivolol (BYSTOLIC) 5 MG tablet Take 0.5 tablets (2.5 mg total) by mouth daily.  30 tablet    . Tretinoin-Cleanser-Moisturizer 0.025 % CREAM KIT Apply topically at bedtime.        . Wheat Dextrin (BENEFIBER) POWD 2 tsp po daily to bid prn constipation, in 6-8 oz of fluid  1  Can  3  . clindamycin-benzoyl peroxide (BENZACLIN) gel Apply topically 2 (two) times daily.        . Levonorgestrel-Ethinyl Estradiol (CAMRESE) 0.15-0.03 &0.01 MG tablet Take 1 tablet by mouth daily.          No Known Allergies  Review of Systems  Review of Systems  Constitutional: Negative for fever and malaise/fatigue.  HENT: Positive for congestion and neck pain.   Eyes: Negative for discharge.  Respiratory: Negative for shortness of breath.   Cardiovascular: Negative for chest pain, palpitations and leg swelling.  Gastrointestinal: Negative for nausea, abdominal pain and diarrhea.  Genitourinary: Negative for dysuria.  Musculoskeletal: Positive for myalgias and back pain. Negative for falls.  Skin: Negative for rash.  Neurological: Positive for headaches. Negative for loss of consciousness.  Endo/Heme/Allergies: Negative for polydipsia.  Psychiatric/Behavioral: Negative for depression and suicidal ideas. The patient is not nervous/anxious and does not have insomnia.     Objective  BP 129/86  Pulse 110  Temp(Src) 98.4 F (36.9 C) (Temporal)  Ht 4' 11.25" (1.505 m)  Wt 125 lb 1.9 oz (56.754 kg)  BMI 25.06 kg/m2  SpO2 95%  LMP 05/30/2011  Physical Exam  Physical Exam  Constitutional: She is oriented to person, place, and time and well-developed, well-nourished, and in no distress. No distress.  HENT:  Head: Normocephalic and atraumatic.  Eyes: Conjunctivae are normal.  Neck: Neck supple. No thyromegaly present.  Cardiovascular: Normal rate, regular rhythm and normal heart sounds.   No murmur heard. Pulmonary/Chest: Effort normal and breath sounds normal. She has no wheezes.  Abdominal: She exhibits no distension and no mass.  Musculoskeletal: She exhibits no edema.  Lymphadenopathy:    She has no cervical adenopathy.  Neurological: She is alert and oriented to person, place, and time.  Skin: Skin is warm and dry. No rash noted. She is not diaphoretic.    Psychiatric: Memory, affect and judgment normal.    Lab Results  Component Value Date   TSH 0.35 03/02/2011   Lab Results  Component Value Date   WBC 5.4 03/02/2011   HGB 13.2 03/02/2011   HCT 39.3 03/02/2011   MCV 93.9 03/02/2011   PLT 330.0 03/02/2011   Lab Results  Component Value Date   CREATININE 0.7 03/02/2011   BUN 5* 03/02/2011  NA 138 03/02/2011   K 3.9 03/02/2011   CL 102 03/02/2011   CO2 29 03/02/2011   Lab Results  Component Value Date   ALT 17 10/07/2010   AST 18 10/07/2010   ALKPHOS 43 10/07/2010   BILITOT 0.5 10/07/2010   Lab Results  Component Value Date   CHOL 175 10/07/2010   Lab Results  Component Value Date   HDL 70.30 10/07/2010   Lab Results  Component Value Date   LDLCALC 86 10/07/2010   Lab Results  Component Value Date   TRIG 96.0 10/07/2010   Lab Results  Component Value Date   CHOLHDL 2 10/07/2010     Assessment & Plan  Fibromyalgia Continues to struggle with chronic daily pain. Has used Skelaxin in past with good response, baclofen did not work and Flexeril caused significant sedation. Will rx Skelaxin, has already been referred to pain management for further help  Allergic state Needs daily antihistamine and Astepro  Hypertension Adequately controlled

## 2011-06-03 NOTE — Assessment & Plan Note (Signed)
Needs daily antihistamine and Astepro

## 2011-06-03 NOTE — Assessment & Plan Note (Signed)
Adequately controlled 

## 2011-06-08 ENCOUNTER — Other Ambulatory Visit: Payer: Self-pay

## 2011-06-08 DIAGNOSIS — F329 Major depressive disorder, single episode, unspecified: Secondary | ICD-10-CM

## 2011-06-08 MED ORDER — BUPROPION HCL 100 MG PO TABS: 100.0000 mg | ORAL_TABLET | Freq: Two times a day (BID) | ORAL | Status: DC

## 2011-06-29 ENCOUNTER — Other Ambulatory Visit: Payer: Self-pay

## 2011-06-29 DIAGNOSIS — I10 Essential (primary) hypertension: Secondary | ICD-10-CM

## 2011-06-29 DIAGNOSIS — M797 Fibromyalgia: Secondary | ICD-10-CM

## 2011-06-29 DIAGNOSIS — M25551 Pain in right hip: Secondary | ICD-10-CM

## 2011-06-29 MED ORDER — DESVENLAFAXINE SUCCINATE ER 100 MG PO TB24: 100.0000 mg | ORAL_TABLET | Freq: Every day | ORAL | Status: DC

## 2011-06-29 MED ORDER — LISINOPRIL 10 MG PO TABS: 10.0000 mg | ORAL_TABLET | Freq: Two times a day (BID) | ORAL | Status: DC

## 2011-06-29 MED ORDER — TRAMADOL HCL 50 MG PO TABS: 50.0000 mg | ORAL_TABLET | ORAL | Status: DC

## 2011-06-30 ENCOUNTER — Ambulatory Visit: Payer: BC Managed Care – PPO | Admitting: Family Medicine

## 2011-06-30 DIAGNOSIS — Z0289 Encounter for other administrative examinations: Secondary | ICD-10-CM

## 2011-07-02 ENCOUNTER — Telehealth: Payer: Self-pay | Admitting: Family Medicine

## 2011-07-02 DIAGNOSIS — M25552 Pain in left hip: Secondary | ICD-10-CM

## 2011-07-02 NOTE — Telephone Encounter (Signed)
Patient has run out of tramadol, please contact pt's husband, patient had foot surgery yesterday.

## 2011-07-02 NOTE — Telephone Encounter (Signed)
RC from patient husband, Lauren Hall.  He states that both he and patient are taking patient's tramadol as both of them have the same condition-fibromyalgia.  Advised pt Dr.Blyth is out of the office and since he has received 360 in March and Lauren Hall has received #120 per month in that same time frame and is not due for refill until 6/10, Dr. Milinda Cave will not authorize refill and this request will be forwarded to [facility] who will be back in the office on Monday.  He is agreeable.

## 2011-07-02 NOTE — Telephone Encounter (Signed)
RX sent to pharmacy on 5/28.  Per Rudell Cobb at [facility] is on hold until 07/12/11.  Pt picked up #120 on 5/12 and insurance will not pay until that time.  If taking as prescribed, pt should not be out of meds.  Message left of Ladene Artist to return my call regarding medication.

## 2011-07-04 NOTE — Telephone Encounter (Signed)
These are controlled meds he will need to come in to discuss

## 2011-07-05 NOTE — Telephone Encounter (Signed)
Left a message for patients husband to return my call

## 2011-07-19 ENCOUNTER — Other Ambulatory Visit: Payer: Self-pay

## 2011-07-19 DIAGNOSIS — I10 Essential (primary) hypertension: Secondary | ICD-10-CM

## 2011-07-19 MED ORDER — LISINOPRIL 10 MG PO TABS: 10.0000 mg | ORAL_TABLET | Freq: Two times a day (BID) | ORAL | Status: DC

## 2011-07-26 ENCOUNTER — Ambulatory Visit: Payer: BC Managed Care – PPO | Admitting: Family Medicine

## 2011-07-27 ENCOUNTER — Ambulatory Visit (INDEPENDENT_AMBULATORY_CARE_PROVIDER_SITE_OTHER): Payer: BC Managed Care – PPO | Admitting: Family Medicine

## 2011-07-27 ENCOUNTER — Encounter: Payer: Self-pay | Admitting: Family Medicine

## 2011-07-27 VITALS — BP 124/78 | HR 90 | Temp 97.2°F | Ht 59.25 in | Wt 126.0 lb

## 2011-07-27 DIAGNOSIS — R41840 Attention and concentration deficit: Secondary | ICD-10-CM

## 2011-07-27 DIAGNOSIS — J019 Acute sinusitis, unspecified: Secondary | ICD-10-CM

## 2011-07-27 DIAGNOSIS — M25551 Pain in right hip: Secondary | ICD-10-CM

## 2011-07-27 DIAGNOSIS — M25552 Pain in left hip: Secondary | ICD-10-CM

## 2011-07-27 DIAGNOSIS — R000 Tachycardia, unspecified: Secondary | ICD-10-CM

## 2011-07-27 DIAGNOSIS — J40 Bronchitis, not specified as acute or chronic: Secondary | ICD-10-CM

## 2011-07-27 DIAGNOSIS — R52 Pain, unspecified: Secondary | ICD-10-CM

## 2011-07-27 DIAGNOSIS — I10 Essential (primary) hypertension: Secondary | ICD-10-CM

## 2011-07-27 DIAGNOSIS — M25559 Pain in unspecified hip: Secondary | ICD-10-CM

## 2011-07-27 HISTORY — DX: Acute sinusitis, unspecified: J01.90

## 2011-07-27 HISTORY — DX: Bronchitis, not specified as acute or chronic: J40

## 2011-07-27 MED ORDER — METHYLPHENIDATE HCL 10 MG PO TABS: 10.0000 mg | ORAL_TABLET | ORAL | Status: DC

## 2011-07-27 MED ORDER — HYDROCODONE-HOMATROPINE 5-1.5 MG/5ML PO SYRP: 5.0000 mL | ORAL_SOLUTION | Freq: Three times a day (TID) | ORAL | Status: AC | PRN

## 2011-07-27 MED ORDER — AZITHROMYCIN 250 MG PO TABS: ORAL_TABLET | ORAL | Status: AC

## 2011-07-27 MED ORDER — LISINOPRIL 10 MG PO TABS: 10.0000 mg | ORAL_TABLET | Freq: Two times a day (BID) | ORAL | Status: DC

## 2011-07-27 MED ORDER — NEBIVOLOL HCL 5 MG PO TABS: 5.0000 mg | ORAL_TABLET | Freq: Every day | ORAL | Status: DC

## 2011-07-27 MED ORDER — RITALIN LA 30 MG PO CP24: 30.0000 mg | ORAL_CAPSULE | ORAL | Status: DC

## 2011-07-27 MED ORDER — TRAMADOL HCL 50 MG PO TABS: ORAL_TABLET | ORAL | Status: DC

## 2011-07-27 MED ORDER — GABAPENTIN 300 MG PO CAPS: ORAL_CAPSULE | ORAL | Status: DC

## 2011-07-27 MED ORDER — METHYLPREDNISOLONE 4 MG PO KIT: PACK | ORAL | Status: AC

## 2011-07-27 NOTE — Assessment & Plan Note (Signed)
Improved on repeat check, given rx for Lisinopril today

## 2011-07-27 NOTE — Progress Notes (Signed)
Patient ID: Lauren Hall, female   DOB: July 25, 1970, 41 y.o.   MRN: [id number] Lauren Hall [id number] [birth year] 07/27/2011      Progress Note-Follow Up  Subjective  Chief Complaint  Chief Complaint  Patient presents with  . Follow-up    1 month    HPI  Patient is a 41 year old American female who is in today for followup. She has just undergone bilateral foot surgery. On 07/02/2011 she had surgery on her right foot for fracture and correction of her fifth toe and she had surgery to shave some bone on the fifth on the left foot as well. She's been staying off her feet as much as possible since then. About 10 days ago she developed a bad headache and a low-grade fever of 101 and ultimately a cough. Over the last 2 weeks the headache and fever have resolved but the cough has been persistent. It is at times productive but is largely dry and does awaken her at night. She denies any throat pain but does have hoarseness. Denies any ear pain has been taking Mucinex hoping that would help but not noting any great difference. No chest pain, palpitations, wheezing but some shortness of breath with coughing is noted. No GI or GU complaints. Fatigue is worse than usual.  Past Medical History  Diagnosis Date  . Anxiety   . Fibromyalgia 2009  . Fibromyalgia   . Anemia   . Allergy   . Depression   . Hypertension   . Fatigue 10/08/2010  . Headache 10/08/2010  . Thyroid disease 10/08/2010  . Hip pain, bilateral 11/06/2010  . Poor concentration 01/11/2011  . Bronchitis 07/27/2011    Past Surgical History  Procedure Date  . Breast lumpectomy 1999    right- benign    Family History  Problem Relation Age of Onset  . Fibromyalgia Mother   . Diabetes Mother     type 2  . Hypertension Mother   . Hyperlipidemia Mother   . Alcohol abuse Father   . Cancer Father     colon  . Hypertension Brother   . Heart disease Maternal Grandmother   . Hyperlipidemia Maternal Grandmother   . Diabetes  Maternal Grandmother     type 2  . Cancer Maternal Grandmother     colon  . Stroke Maternal Grandfather   . Diabetes Maternal Grandfather     type 2  . Cancer Paternal Grandmother     ovarian  . Alzheimer's disease Paternal Grandfather   . Alcohol abuse Brother     History   Social History  . Marital Status: Married    Spouse Name: N/A    Number of Children: N/A  . Years of Education: N/A   Occupational History  . Not on file.   Social History Main Topics  . Smoking status: Never Smoker   . Smokeless tobacco: Never Used  . Alcohol Use: Yes     special occassion- very rarely  . Drug Use: No  . Sexually Active: Yes -- Female partner(s)   Other Topics Concern  . Not on file   Social History Narrative  . No narrative on file    Current Outpatient Prescriptions on File Prior to Visit  Medication Sig Dispense Refill  . Azelastine HCl (ASTEPRO) 0.15 % SOLN Place 2 sprays into the nose daily.        Marland Kitchen buPROPion (WELLBUTRIN) 100 MG tablet Take 1 tablet (100 mg total) by mouth 2 (two) times daily.  180  tablet  1  . Cholecalciferol (VITAMIN D) 2000 UNITS CAPS Take 2,000 capsules by mouth 2 (two) times daily.        . clindamycin-benzoyl peroxide (BENZACLIN) gel Apply topically 2 (two) times daily.        Marland Kitchen desvenlafaxine (PRISTIQ) 100 MG 24 hr tablet Take 1 tablet (100 mg total) by mouth daily.  90 tablet  1  . fish oil-omega-3 fatty acids 1000 MG capsule MegaRed 1 cap by mouth daily (by Schiff)  30 capsule  1  . Levonorgestrel-Ethinyl Estradiol (CAMRESE) 0.15-0.03 &0.01 MG tablet Take 1 tablet by mouth daily.        Marland Kitchen LIDODERM 5 %       . Tretinoin-Cleanser-Moisturizer 0.025 % CREAM KIT Apply topically at bedtime.        . Wheat Dextrin (BENEFIBER) POWD 2 tsp po daily to bid prn constipation, in 6-8 oz of fluid  1 Can  3  . DISCONTD: gabapentin (NEURONTIN) 300 MG capsule TAKE 1 CAPSULE (300 MG TOTAL) BY MOUTH AS DIRECTED. 1 IN AM, 2 IN PM  270 capsule  0  . DISCONTD:  lisinopril (PRINIVIL,ZESTRIL) 10 MG tablet Take 1 tablet (10 mg total) by mouth 2 (two) times daily.  180 tablet  1  . DISCONTD: methylphenidate (RITALIN) 10 MG tablet Take 1 tablet (10 mg total) by mouth 1 day or 1 dose. In pm April 2013 rx  30 tablet  0  . DISCONTD: nebivolol (BYSTOLIC) 5 MG tablet Take 0.5 tablets (2.5 mg total) by mouth daily.  30 tablet    . DISCONTD: RITALIN LA 30 MG 24 hr capsule Take 1 capsule (30 mg total) by mouth every morning.  30 capsule  0  . Bepotastine Besilate (BEPREVE) 1.5 % SOLN Apply to eye.        Marland Kitchen EPIPEN 2-PAK 0.3 MG/0.3ML DEVI       . LORazepam (ATIVAN) 0.5 MG tablet Take 1 tablet (0.5 mg total) by mouth 2 (two) times daily as needed. For anxiety/ insomnia  30 tablet  1  . NON FORMULARY Allergy shots        Allergies  Allergen Reactions  . Promethazine Swelling    Review of Systems  Review of Systems  Constitutional: Positive for fever and malaise/fatigue.  HENT: Positive for congestion. Negative for sore throat.   Eyes: Negative for discharge.  Respiratory: Positive for cough, sputum production and shortness of breath. Negative for wheezing.   Cardiovascular: Negative for chest pain, palpitations and leg swelling.  Gastrointestinal: Negative for nausea, abdominal pain and diarrhea.  Genitourinary: Negative for dysuria.  Musculoskeletal: Positive for joint pain. Negative for falls.       B/l foot pain  Skin: Negative for rash.  Neurological: Negative for loss of consciousness and headaches.  Endo/Heme/Allergies: Negative for polydipsia.  Psychiatric/Behavioral: Negative for depression and suicidal ideas. The patient is not nervous/anxious and does not have insomnia.     Objective  BP 124/78  Pulse 90  Temp 97.2 F (36.2 C) (Temporal)  Ht 4' 11.25" (1.505 m)  Wt 126 lb (57.153 kg)  BMI 25.23 kg/m2  SpO2 97%  LMP 06/27/2011  Physical Exam  Physical Exam  Constitutional: She is oriented to person, place, and time and  well-developed, well-nourished, and in no distress. No distress.  HENT:  Head: Normocephalic and atraumatic.       Left TM mildly erythematous  Eyes: Conjunctivae are normal.  Neck: Neck supple. No thyromegaly present.  Cardiovascular: Normal rate, regular rhythm  and normal heart sounds.   No murmur heard. Pulmonary/Chest: Effort normal. She has no wheezes.       RLL rhonchi  Abdominal: She exhibits no distension and no mass.  Musculoskeletal: She exhibits no edema.  Lymphadenopathy:    She has no cervical adenopathy.  Neurological: She is alert and oriented to person, place, and time.  Skin: Skin is warm and dry. No rash noted. She is not diaphoretic.  Psychiatric: Memory, affect and judgment normal.    Lab Results  Component Value Date   TSH 0.35 03/02/2011   Lab Results  Component Value Date   WBC 5.4 03/02/2011   HGB 13.2 03/02/2011   HCT 39.3 03/02/2011   MCV 93.9 03/02/2011   PLT 330.0 03/02/2011   Lab Results  Component Value Date   CREATININE 0.7 03/02/2011   BUN 5* 03/02/2011   NA 138 03/02/2011   K 3.9 03/02/2011   CL 102 03/02/2011   CO2 29 03/02/2011   Lab Results  Component Value Date   ALT 17 10/07/2010   AST 18 10/07/2010   ALKPHOS 43 10/07/2010   BILITOT 0.5 10/07/2010   Lab Results  Component Value Date   CHOL 175 10/07/2010   Lab Results  Component Value Date   HDL 70.30 10/07/2010   Lab Results  Component Value Date   LDLCALC 86 10/07/2010   Lab Results  Component Value Date   TRIG 96.0 10/07/2010   Lab Results  Component Value Date   CHOLHDL 2 10/07/2010     Assessment & Plan  Bronchitis Azithromycin and a medrol dose pak, Hydromet prn, Mucinex and push clear fluids  Poor concentration She has been pleased with her response to the 2 doses of long and short acting Ritalin but she has not taken for a week or so so we are unable to assess her vital signs on it. She is given a 2 month supply and we will see her back in 6 weeks or  so  Tachycardia Improved on recheck, is coughing extensively in the visit. Reassess at next visit  Hypertension Improved on repeat check, given rx for Lisinopril today

## 2011-07-27 NOTE — Assessment & Plan Note (Signed)
She has been pleased with her response to the 2 doses of long and short acting Ritalin but she has not taken for a week or so so we are unable to assess her vital signs on it. She is given a 2 month supply and we will see her back in 6 weeks or so

## 2011-07-27 NOTE — Assessment & Plan Note (Signed)
Improved on recheck, is coughing extensively in the visit. Reassess at next visit

## 2011-07-27 NOTE — Assessment & Plan Note (Signed)
Azithromycin and a medrol dose pak, Hydromet prn, Mucinex and push clear fluids

## 2011-07-27 NOTE — Patient Instructions (Addendum)

## 2011-10-07 ENCOUNTER — Emergency Department (HOSPITAL_COMMUNITY): Payer: BC Managed Care – PPO

## 2011-10-07 ENCOUNTER — Emergency Department (HOSPITAL_COMMUNITY)
Admission: EM | Admit: 2011-10-07 | Discharge: 2011-10-07 | Disposition: A | Discharge status: Home / Self Care | Payer: BC Managed Care – PPO | Attending: Emergency Medicine | Admitting: Emergency Medicine

## 2011-10-07 ENCOUNTER — Encounter (HOSPITAL_COMMUNITY): Payer: Self-pay | Admitting: Cardiology

## 2011-10-07 DIAGNOSIS — I10 Essential (primary) hypertension: Secondary | ICD-10-CM | POA: Insufficient documentation

## 2011-10-07 DIAGNOSIS — M542 Cervicalgia: Secondary | ICD-10-CM | POA: Insufficient documentation

## 2011-10-07 DIAGNOSIS — IMO0001 Reserved for inherently not codable concepts without codable children: Secondary | ICD-10-CM | POA: Insufficient documentation

## 2011-10-07 DIAGNOSIS — Z043 Encounter for examination and observation following other accident: Secondary | ICD-10-CM | POA: Insufficient documentation

## 2011-10-07 DIAGNOSIS — D649 Anemia, unspecified: Secondary | ICD-10-CM | POA: Insufficient documentation

## 2011-10-07 DIAGNOSIS — E079 Disorder of thyroid, unspecified: Secondary | ICD-10-CM | POA: Insufficient documentation

## 2011-10-07 MED ORDER — HYDROCODONE-ACETAMINOPHEN 5-325 MG PO TABS
1.0000 | ORAL_TABLET | Freq: Once | ORAL | Status: AC
  Administered 2011-10-07: 1 via ORAL
  Filled 2011-10-07: qty 1

## 2011-10-07 MED ORDER — IBUPROFEN 800 MG PO TABS: 800.0000 mg | ORAL_TABLET | Freq: Three times a day (TID) | ORAL | Status: AC | PRN
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
## 2011-10-07 NOTE — ED Notes (Signed)
Pt family number-[phone]

## 2011-10-07 NOTE — ED Provider Notes (Signed)
  I performed a history and physical examination of Lauren Hall and discussed her management with Dr. Clinton Sawyer.  I agree with the history, physical, assessment, and plan of care, with the following exceptions: None    Lauren Hall, Lauren Coil, MD 10/07/11 1642
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
## 2011-11-02 ENCOUNTER — Other Ambulatory Visit: Payer: Self-pay

## 2011-11-02 DIAGNOSIS — I10 Essential (primary) hypertension: Secondary | ICD-10-CM

## 2011-11-02 MED ORDER — LISINOPRIL 10 MG PO TABS: 10.0000 mg | ORAL_TABLET | Freq: Two times a day (BID) | ORAL | Status: DC

## 2011-11-05 ENCOUNTER — Ambulatory Visit (INDEPENDENT_AMBULATORY_CARE_PROVIDER_SITE_OTHER): Payer: BC Managed Care – PPO | Admitting: Family Medicine

## 2011-11-05 ENCOUNTER — Encounter: Payer: Self-pay | Admitting: Family Medicine

## 2011-11-05 VITALS — BP 131/91 | HR 101 | Temp 97.8°F | Ht 59.25 in | Wt 134.8 lb

## 2011-11-05 DIAGNOSIS — I10 Essential (primary) hypertension: Secondary | ICD-10-CM

## 2011-11-05 DIAGNOSIS — R52 Pain, unspecified: Secondary | ICD-10-CM

## 2011-11-05 DIAGNOSIS — L0291 Cutaneous abscess, unspecified: Secondary | ICD-10-CM

## 2011-11-05 DIAGNOSIS — R41840 Attention and concentration deficit: Secondary | ICD-10-CM

## 2011-11-05 DIAGNOSIS — L0390 Cellulitis, unspecified: Secondary | ICD-10-CM

## 2011-11-05 DIAGNOSIS — S30861A Insect bite (nonvenomous) of abdominal wall, initial encounter: Secondary | ICD-10-CM

## 2011-11-05 DIAGNOSIS — W57XXXA Bitten or stung by nonvenomous insect and other nonvenomous arthropods, initial encounter: Secondary | ICD-10-CM

## 2011-11-05 MED ORDER — METHYLPHENIDATE HCL 10 MG PO TABS: 10.0000 mg | ORAL_TABLET | ORAL | Status: DC

## 2011-11-05 MED ORDER — RITALIN LA 30 MG PO CP24: 30.0000 mg | ORAL_CAPSULE | ORAL | Status: DC

## 2011-11-05 MED ORDER — GABAPENTIN 300 MG PO CAPS: ORAL_CAPSULE | ORAL | Status: DC

## 2011-11-05 MED ORDER — DOXYCYCLINE HYCLATE 100 MG PO TABS: 100.0000 mg | ORAL_TABLET | Freq: Two times a day (BID) | ORAL | Status: DC

## 2011-11-05 NOTE — Patient Instructions (Addendum)
Deer Tick Bite Deer ticks are brown arachnids (spider family) that vary in size from as small as the head of a pin to 1/4 inch (1/2 cm) diameter. They thrive in wooded areas. Deer are the preferred host of adult deer ticks. Small rodents are the host of young ticks (nymphs). When a person walks in a field or wooded area, young and adult ticks in the surrounding grass and vegetation can attach themselves to the skin. They can suck blood for hours to days if unnoticed. Ticks are found all over the [place] Some ticks carry a specific bacteria (Borrelia burgdorferi) that causes an infection called Lyme disease. The bacteria is typically passed into a person during the blood sucking process. This happens after the tick has been attached for at least a number of hours. While ticks can be found all over the [place] those carrying the bacteria that causes Lyme disease are most common in [place] and the [place]. Only a small proportion of ticks in these areas carry the Lyme disease bacteria and cause human infections. Ticks usually attach to warm spots on the body, such as the:  Head.  Back.  Neck.  Armpits.  Groin. SYMPTOMS  Most of the time, a deer tick bite will not be felt. You may or may not see the attached tick. You may notice mild irritation or redness around the bite site. If the deer tick passes the Lyme disease bacteria to a person, a round, red rash may be noticed 2 to 3 days after the bite. The rash may be clear in the middle, like a bull's-eye or target. If not treated, other symptoms may develop several days to weeks after the onset of the rash. These symptoms may include:  New rash lesions.  Fatigue and weakness.  General ill feeling and achiness.  Chills.  Headache and neck pain.  Swollen lymph glands.  Sore muscles and joints. 5 to 15% of untreated people with Lyme disease may develop more severe illnesses after several weeks to months. This may include inflammation of the  brain lining (meningitis), nerve palsies, an abnormal heartbeat, or severe muscle and joint pain and inflammation (myositis or arthritis). DIAGNOSIS   Physical exam and medical history.  Viewing the tick if it was saved for confirmation.  Blood tests (to check or confirm the presence of Lyme disease). TREATMENT  Most ticks do not carry disease. If found, an attached tick should be removed using tweezers. Tweezers should be placed under the body of the tick so it is removed by its attachment parts (pincers). If there are signs or symptoms of being sick, or Lyme disease is confirmed, medicines (antibiotics) that kill germs are usually prescribed. In more severe cases, antibiotics may be given through an intravenous (IV) access. HOME CARE INSTRUCTIONS   Always remove ticks with tweezers. Do not use petroleum jelly or other methods to kill or remove the tick. Slide the tweezers under the body and pull out as much as you can. If you are not sure what it is, save it in a jar and show your caregiver.  Once you remove the tick, the skin will heal on its own. Wash your hands and the affected area with water and soap. You may place a bandage on the affected area.  Take medicine as directed. You may be advised to take a full course of antibiotics.  Follow up with your caregiver as recommended. FINDING OUT THE RESULTS OF YOUR TEST Not all test results are available   be advised to take a full course of antibiotics.   Follow up with your caregiver as recommended.  FINDING OUT THE RESULTS OF YOUR TEST  Not all test results are available during your visit. If your test results are not back during the visit, make an appointment with your caregiver to find out the results. Do not assume everything is normal if you have not heard from your caregiver or the medical facility. It is important for you to follow up on all of your test results.  PROGNOSIS   If Lyme disease is confirmed, early treatment with antibiotics is very effective. Following preventive guidelines is important since it is possible to get the disease more than once.  PREVENTION    Wear long sleeves and long pants in  wooded or grassy areas. Tuck your pants into your socks.   Use an insect repellent while hiking.   Check yourself, your children, and your pets regularly for ticks after playing outside.   Clear piles of leaves or brush from your yard. Ticks might live there.  SEEK MEDICAL CARE IF:    You or your child has an oral temperature above 102 F (38.9 C).   You develop a severe headache following the bite.   You feel generally ill.   You notice a rash.   You are having trouble removing the tick.   The bite area has red skin or yellow drainage.  SEEK IMMEDIATE MEDICAL CARE IF:    Your face is weak and droopy or you have other neurological symptoms.   You have severe joint pain or weakness.  MAKE SURE YOU:    Understand these instructions.   Will watch your condition.   Will get help right away if you are not doing well or get worse.  FOR MORE INFORMATION  Centers for Disease Control and Prevention: www.cdc.gov  American Academy of Family Physicians: www.aafp.org  Document Released: 04/14/2009 Document Revised: 04/12/2011 Document Reviewed: 04/14/2009  [employer] Patient Information 2013 [employer]

## 2011-11-09 ENCOUNTER — Encounter: Payer: Self-pay | Admitting: Family Medicine

## 2011-11-09 DIAGNOSIS — S30861A Insect bite (nonvenomous) of abdominal wall, initial encounter: Secondary | ICD-10-CM | POA: Insufficient documentation

## 2011-11-09 DIAGNOSIS — W57XXXA Bitten or stung by nonvenomous insect and other nonvenomous arthropods, initial encounter: Secondary | ICD-10-CM

## 2011-11-09 HISTORY — DX: Insect bite (nonvenomous) of abdominal wall, initial encounter: W57.XXXA

## 2011-11-09 HISTORY — DX: Insect bite (nonvenomous) of abdominal wall, initial encounter: S30.861A

## 2011-11-09 NOTE — Assessment & Plan Note (Signed)
Adequately controlled no changes.  

## 2011-11-09 NOTE — Assessment & Plan Note (Signed)
Removed a tick today and has been struggling with an itchy irritated lesion above where the tick was found for several days. She was in [place] this past week end. She is started on Doxycyline 100 mg po bid, start a probiotic

## 2011-11-09 NOTE — Assessment & Plan Note (Signed)
Doing well on ritalin, refills givem

## 2011-11-09 NOTE — Progress Notes (Signed)
Patient ID: Lauren Hall, female   DOB: 1970/03/17, 41 y.o.   MRN: [id number] [name] [id number] 1970-09-10 11/09/2011      Progress Note-Follow Up  Subjective  Chief Complaint  Chief Complaint  Patient presents with  . tick bite    swollen right side-waistline X tuesday night    HPI  Patient is a 41 year old African American female who is in today for evaluation of tick bite. She removed the tick which is very small. Areas been itchy and red since then. No target lesion. She has another lesion just above the first one1, it is raised itchy and erythematous but she did have her see a tick. She struggles also with headache, malaise, myalgias arthralgias or myalgias. No chest pain or palpitations. No GI or GU complaints  Past Medical History  Diagnosis Date  . Anxiety   . Fibromyalgia 2009  . Fibromyalgia   . Anemia   . Allergy   . Depression   . Hypertension   . Fatigue 10/08/2010  . Headache 10/08/2010  . Thyroid disease 10/08/2010  . Hip pain, bilateral 11/06/2010  . Poor concentration 01/11/2011  . Bronchitis 07/27/2011  . Tick bite of flank 11/09/2011    Past Surgical History  Procedure Date  . Breast lumpectomy 1999    right- benign    Family History  Problem Relation Age of Onset  . Fibromyalgia Mother   . Diabetes Mother     type 2  . Hypertension Mother   . Hyperlipidemia Mother   . Alcohol abuse Father   . Cancer Father     colon  . Hypertension Brother   . Heart disease Maternal Grandmother   . Hyperlipidemia Maternal Grandmother   . Diabetes Maternal Grandmother     type 2  . Cancer Maternal Grandmother     colon  . Stroke Maternal Grandfather   . Diabetes Maternal Grandfather     type 2  . Cancer Paternal Grandmother     ovarian  . Alzheimer's disease Paternal Grandfather   . Alcohol abuse Brother     History   Social History  . Marital Status: Married    Spouse Name: N/A    Number of Children: N/A  . Years of Education: N/A    Occupational History  . Not on file.   Social History Main Topics  . Smoking status: Never Smoker   . Smokeless tobacco: Never Used  . Alcohol Use: Yes     special occassion- very rarely  . Drug Use: No  . Sexually Active: Yes -- Female partner(s)   Other Topics Concern  . Not on file   Social History Narrative  . No narrative on file    Current Outpatient Prescriptions on File Prior to Visit  Medication Sig Dispense Refill  . buPROPion (WELLBUTRIN) 100 MG tablet Take 1 tablet (100 mg total) by mouth 2 (two) times daily.  180 tablet  1  . Cholecalciferol (VITAMIN D) 2000 UNITS CAPS Take 2,000 capsules by mouth 2 (two) times daily.        . clindamycin-benzoyl peroxide (BENZACLIN) gel Apply topically 2 (two) times daily.        Marland Kitchen desvenlafaxine (PRISTIQ) 100 MG 24 hr tablet Take 1 tablet (100 mg total) by mouth daily.  90 tablet  1  . EPINEPHrine (EPI-PEN) 0.3 mg/0.3 mL DEVI Inject 0.3 mg into the muscle once.      . fish oil-omega-3 fatty acids 1000 MG capsule MegaRed 1 cap by  mouth daily (by Schiff)  30 capsule  1  . gabapentin (NEURONTIN) 300 MG capsule 1 tab po bid and 2 tabs po qhs  360 capsule  1  . Levonorgestrel-Ethinyl Estradiol (CAMRESE) 0.15-0.03 &0.01 MG tablet Take 1 tablet by mouth daily.        Marland Kitchen lisinopril (PRINIVIL,ZESTRIL) 10 MG tablet Take 1 tablet (10 mg total) by mouth 2 (two) times daily.  180 tablet  2  . nebivolol (BYSTOLIC) 5 MG tablet Take 1 tablet (5 mg total) by mouth daily.  30 tablet  0  . RITALIN LA 30 MG 24 hr capsule Take 1 capsule (30 mg total) by mouth every morning. December 2013 rx  30 capsule  0  . traMADol (ULTRAM) 50 MG tablet 2 in am, and 1 q noon and 2 in pm  150 tablet  3  . Tretinoin-Cleanser-Moisturizer 0.025 % CREAM KIT Apply topically at bedtime.        . Wheat Dextrin (BENEFIBER) POWD 2 tsp po daily to bid prn constipation, in 6-8 oz of fluid  1 Can  3  . NON FORMULARY Allergy shots        Allergies  Allergen Reactions  .  Promethazine Anaphylaxis    Review of Systems  Review of Systems  Constitutional: Positive for malaise/fatigue. Negative for fever.  HENT: Negative for congestion.   Eyes: Negative for discharge.  Respiratory: Negative for shortness of breath.   Cardiovascular: Negative for chest pain, palpitations and leg swelling.  Gastrointestinal: Negative for nausea, abdominal pain and diarrhea.  Genitourinary: Negative for dysuria.  Musculoskeletal: Positive for myalgias and joint pain. Negative for falls.  Skin: Positive for itching and rash.  Neurological: Positive for headaches. Negative for loss of consciousness.  Endo/Heme/Allergies: Negative for polydipsia.  Psychiatric/Behavioral: Negative for depression and suicidal ideas. The patient is not nervous/anxious and does not have insomnia.     Objective  BP 131/91  Pulse 101  Temp 97.8 F (36.6 C) (Temporal)  Ht 4' 11.25" (1.505 m)  Wt 134 lb 12.8 oz (61.145 kg)  BMI 27.00 kg/m2  SpO2 97%  LMP 10/30/2011  Physical Exam  Physical Exam  Constitutional: She is oriented to person, place, and time and well-developed, well-nourished, and in no distress. No distress.  HENT:  Head: Normocephalic and atraumatic.  Eyes: Conjunctivae normal are normal.  Neck: Neck supple. No thyromegaly present.  Cardiovascular: Normal rate, regular rhythm and normal heart sounds.   No murmur heard. Pulmonary/Chest: Effort normal and breath sounds normal. She has no wheezes.  Abdominal: She exhibits no distension and no mass.  Musculoskeletal: She exhibits no edema.  Lymphadenopathy:    She has no cervical adenopathy.  Neurological: She is alert and oriented to person, place, and time.  Skin: Skin is warm and dry. Rash noted. She is not diaphoretic.       2 small discreet lesions on right flank, erythematous and circumscribed  Psychiatric: Memory, affect and judgment normal.    Lab Results  Component Value Date   TSH 0.35 03/02/2011   Lab Results   Component Value Date   WBC 5.4 03/02/2011   HGB 13.2 03/02/2011   HCT 39.3 03/02/2011   MCV 93.9 03/02/2011   PLT 330.0 03/02/2011   Lab Results  Component Value Date   CREATININE 0.7 03/02/2011   BUN 5* 03/02/2011   NA 138 03/02/2011   K 3.9 03/02/2011   CL 102 03/02/2011   CO2 29 03/02/2011   Lab Results  Component Value  Date   ALT 17 10/07/2010   AST 18 10/07/2010   ALKPHOS 43 10/07/2010   BILITOT 0.5 10/07/2010   Lab Results  Component Value Date   CHOL 175 10/07/2010   Lab Results  Component Value Date   HDL 70.30 10/07/2010   Lab Results  Component Value Date   LDLCALC 86 10/07/2010   Lab Results  Component Value Date   TRIG 96.0 10/07/2010   Lab Results  Component Value Date   CHOLHDL 2 10/07/2010     Assessment & Plan  Tick bite of flank Removed a tick today and has been struggling with an itchy irritated lesion above where the tick was found for several days. She was in [place] this past week end. She is started on Doxycyline 100 mg po bid, start a probiotic  Poor concentration Doing well on ritalin, refills givem  Hypertension Adequately controlled no changes

## 2011-12-14 ENCOUNTER — Other Ambulatory Visit: Payer: Self-pay | Admitting: *Deleted

## 2011-12-14 DIAGNOSIS — M797 Fibromyalgia: Secondary | ICD-10-CM

## 2011-12-14 MED ORDER — DESVENLAFAXINE SUCCINATE ER 100 MG PO TB24: 100.0000 mg | ORAL_TABLET | Freq: Every day | ORAL | Status: DC

## 2011-12-14 NOTE — Telephone Encounter (Signed)
Faxed refill request received from pharmacy for PRISTIQ Last filled by MD on 06/29/11, #90 X 1 Last seen on 11/05/11 Follow up 2 MONTHS Refill sent per [facility] refill protocol.

## 2011-12-15 ENCOUNTER — Other Ambulatory Visit: Payer: Self-pay | Admitting: Family Medicine

## 2011-12-15 DIAGNOSIS — I10 Essential (primary) hypertension: Secondary | ICD-10-CM

## 2011-12-15 DIAGNOSIS — R000 Tachycardia, unspecified: Secondary | ICD-10-CM

## 2011-12-15 MED ORDER — NEBIVOLOL HCL 5 MG PO TABS: 5.0000 mg | ORAL_TABLET | Freq: Every day | ORAL | Status: DC

## 2012-02-06 ENCOUNTER — Other Ambulatory Visit: Payer: Self-pay | Admitting: Family Medicine

## 2012-02-18 ENCOUNTER — Other Ambulatory Visit: Payer: Self-pay | Admitting: Family Medicine

## 2012-02-18 DIAGNOSIS — R000 Tachycardia, unspecified: Secondary | ICD-10-CM

## 2012-02-18 DIAGNOSIS — I10 Essential (primary) hypertension: Secondary | ICD-10-CM

## 2012-02-18 MED ORDER — NEBIVOLOL HCL 5 MG PO TABS: 5.0000 mg | ORAL_TABLET | Freq: Every day | ORAL | Status: DC

## 2012-03-07 ENCOUNTER — Encounter: Payer: Self-pay | Admitting: Family

## 2012-03-07 ENCOUNTER — Other Ambulatory Visit: Payer: Self-pay | Admitting: Family

## 2012-03-07 ENCOUNTER — Ambulatory Visit (INDEPENDENT_AMBULATORY_CARE_PROVIDER_SITE_OTHER): Payer: BC Managed Care – PPO | Admitting: Family

## 2012-03-07 VITALS — BP 140/80 | HR 82 | Temp 99.1°F | Resp 16 | Wt 143.1 lb

## 2012-03-07 DIAGNOSIS — R41840 Attention and concentration deficit: Secondary | ICD-10-CM

## 2012-03-07 DIAGNOSIS — F329 Major depressive disorder, single episode, unspecified: Secondary | ICD-10-CM

## 2012-03-07 DIAGNOSIS — R5383 Other fatigue: Secondary | ICD-10-CM

## 2012-03-07 DIAGNOSIS — F32A Depression, unspecified: Secondary | ICD-10-CM

## 2012-03-07 DIAGNOSIS — R21 Rash and other nonspecific skin eruption: Secondary | ICD-10-CM

## 2012-03-07 DIAGNOSIS — R000 Tachycardia, unspecified: Secondary | ICD-10-CM

## 2012-03-07 MED ORDER — RITALIN LA 30 MG PO CP24: 30.0000 mg | ORAL_CAPSULE | ORAL | Status: DC

## 2012-03-07 MED ORDER — ESCITALOPRAM OXALATE 10 MG PO TABS: 10.0000 mg | ORAL_TABLET | Freq: Every day | ORAL | Status: DC

## 2012-03-07 MED ORDER — METHYLPHENIDATE HCL 10 MG PO TABS: 10.0000 mg | ORAL_TABLET | ORAL | Status: DC

## 2012-03-07 NOTE — Assessment & Plan Note (Signed)
Etiology likely multifactorial:fibro, depression?osa. Order sleep stdy, check lyme titers, ana, cbc, esr, ra.

## 2012-03-07 NOTE — Patient Instructions (Addendum)
Lexapro 10mg -start 1/2 tablet once daily for 1 week and then increase to a full tablet once daily on week two as tolerated.  Please complete your lab work prior to leaving.  Follow up with Dr. Abner Greenspan in 1 month.  Call sooner if symptoms worsen or if symptoms do not improve.

## 2012-03-07 NOTE — Assessment & Plan Note (Signed)
Deteriorated. Trial of lexapro 10.  I instructed pt to start 1/2 tablet once daily for 1 week and then increase to a full tablet once daily on week two as tolerated.  We discussed common side effects such as nausea, drowsiness and weight gain.  Plan follow up in 1 month to evaluate progress.

## 2012-03-07 NOTE — Assessment & Plan Note (Signed)
Refer to cards. 

## 2012-03-07 NOTE — Progress Notes (Signed)
Subjective:    Patient ID: Lauren Hall, female    DOB: 03-26-1970, 42 y.o.   MRN: [id number]  HPI  Lauren Hall is a 42 yr old female who presents today with chief complaint of fatigue and rash. She reports that the rash has resolved.  She had two rash patches.  One on the left back and on the right flank area. Reports that rash started after tick bite back in October at which time she saw dr. Abner Greenspan and was treated empirically with doxycycline. She reports that her rash continued after completion of abx, but reports she does not have rash at this time.  She reports worsening fatigue. Especially at night.  She reports chronic myalgias due to fibromyalgia, but notes recent increased stiffness.    Depression-reports that pristiq and effexor do not seem to be helping her depression. Still feels "blah." Reports that she has seen a therapist in the past but did not enjoy this experience and does not wish to pursue additional therapy.   She tells me that she took ome of her daughter's vyvanse and thought that it worked better than her ritalin. She is asking to switch.  Husband notes that her hr at home has been as high as 140 at rest. Requesting referral to cardiology for evaluation. He is also asking that her sleep study be reordered.  Review of Systems See hpi  Past Medical History  Diagnosis Date  . Anxiety   . Fibromyalgia 2009  . Fibromyalgia   . Anemia   . Allergy   . Depression   . Hypertension   . Fatigue 10/08/2010  . Headache 10/08/2010  . Thyroid disease 10/08/2010  . Hip pain, bilateral 11/06/2010  . Poor concentration 01/11/2011  . Bronchitis 07/27/2011  . Tick bite of flank 11/09/2011    History   Social History  . Marital Status: Married    Spouse Name: N/A    Number of Children: N/A  . Years of Education: N/A   Occupational History  . Not on file.   Social History Main Topics  . Smoking status: Never Smoker   . Smokeless tobacco: Never Used  . Alcohol Use: Yes      Comment: special occassion- very rarely  . Drug Use: No  . Sexually Active: Yes -- Female partner(s)   Other Topics Concern  . Not on file   Social History Narrative  . No narrative on file    Past Surgical History  Procedure Date  . Breast lumpectomy 1999    right- benign    Family History  Problem Relation Age of Onset  . Fibromyalgia Mother   . Diabetes Mother     type 2  . Hypertension Mother   . Hyperlipidemia Mother   . Alcohol abuse Father   . Cancer Father     colon  . Hypertension Brother   . Heart disease Maternal Grandmother   . Hyperlipidemia Maternal Grandmother   . Diabetes Maternal Grandmother     type 2  . Cancer Maternal Grandmother     colon  . Stroke Maternal Grandfather   . Diabetes Maternal Grandfather     type 2  . Cancer Paternal Grandmother     ovarian  . Alzheimer's disease Paternal Grandfather   . Alcohol abuse Brother     Allergies  Allergen Reactions  . Promethazine Anaphylaxis    Current Outpatient Prescriptions on File Prior to Visit  Medication Sig Dispense Refill  . buPROPion (WELLBUTRIN) 100 MG  tablet Take 1 tablet (100 mg total) by mouth 2 (two) times daily.  180 tablet  1  . clindamycin-benzoyl peroxide (BENZACLIN) gel Apply topically 2 (two) times daily.        Marland Kitchen desvenlafaxine (PRISTIQ) 100 MG 24 hr tablet Take 1 tablet (100 mg total) by mouth daily.  90 tablet  0  . EPINEPHrine (EPI-PEN) 0.3 mg/0.3 mL DEVI Inject 0.3 mg into the muscle once.      . fish oil-omega-3 fatty acids 1000 MG capsule MegaRed 1 cap by mouth daily (by Schiff)  30 capsule  1  . gabapentin (NEURONTIN) 300 MG capsule 1 tab po bid and 2 tabs po qhs  360 capsule  1  . lisinopril (PRINIVIL,ZESTRIL) 10 MG tablet Take 1 tablet (10 mg total) by mouth 2 (two) times daily.  180 tablet  2  . nebivolol (BYSTOLIC) 5 MG tablet Take 1 tablet (5 mg total) by mouth daily.  30 tablet  3  . NON FORMULARY Allergy shots      . RITALIN LA 30 MG 24 hr capsule Take 1  capsule (30 mg total) by mouth every morning. December 2013 rx  30 capsule  0  . traMADol (ULTRAM) 50 MG tablet TAKE 2 TABLETS EVERY MORNING, 1 TABLET AT NOON, AND 2 TABLETS IN THE EVENING  150 tablet  3  . Tretinoin-Cleanser-Moisturizer 0.025 % CREAM KIT Apply topically at bedtime.        . Wheat Dextrin (BENEFIBER) POWD 2 tsp po daily to bid prn constipation, in 6-8 oz of fluid  1 Can  3  . Cholecalciferol (VITAMIN D) 2000 UNITS CAPS Take 2,000 capsules by mouth 2 (two) times daily.        Marland Kitchen doxycycline (VIBRA-TABS) 100 MG tablet Take 1 tablet (100 mg total) by mouth 2 (two) times daily.  40 tablet  0  . escitalopram (LEXAPRO) 10 MG tablet Take 1 tablet (10 mg total) by mouth daily.  30 tablet  0  . Levonorgestrel-Ethinyl Estradiol (CAMRESE) 0.15-0.03 &0.01 MG tablet Take 1 tablet by mouth daily.          BP 140/80  Pulse 82  Temp 99.1 F (37.3 C) (Oral)  Resp 16  Wt 143 lb 1.3 oz (64.901 kg)  SpO2 99%  LMP 03/03/2012       Objective:   Physical Exam  Constitutional: She appears well-developed and well-nourished. No distress.  Cardiovascular: Normal rate and regular rhythm.   No murmur heard. Pulmonary/Chest: Effort normal and breath sounds normal. No respiratory distress. She has no wheezes. She has no rales. She exhibits no tenderness.  Musculoskeletal: She exhibits no edema.  Psychiatric: Her mood appears not anxious.       Flat affect.          Assessment & Plan:  Case discussed with Dr. Abner Greenspan

## 2012-03-08 LAB — CBC WITH DIFFERENTIAL/PLATELET
Basophils Absolute: 0 10*3/uL (ref 0.0–0.1)
Eosinophils Relative: 1 % (ref 0–5)
HCT: 33.7 % — ABNORMAL LOW (ref 36.0–46.0)
Lymphocytes Relative: 39 % (ref 12–46)
Lymphs Abs: 2.7 10*3/uL (ref 0.7–4.0)
MCV: 85.5 fL (ref 78.0–100.0)
Monocytes Absolute: 0.6 10*3/uL (ref 0.1–1.0)
Neutro Abs: 3.6 10*3/uL (ref 1.7–7.7)
Platelets: 405 10*3/uL — ABNORMAL HIGH (ref 150–400)
RBC: 3.94 MIL/uL (ref 3.87–5.11)
WBC: 7 10*3/uL (ref 4.0–10.5)

## 2012-03-08 LAB — SEDIMENTATION RATE: Sed Rate: 1 mm/h (ref 0–22)

## 2012-03-08 LAB — ANA: Anti Nuclear Antibody(ANA): NEGATIVE

## 2012-03-08 NOTE — Progress Notes (Signed)
  Subjective:    Patient ID: Lauren Hall, female    DOB: 10/13/1970, 41 y.o.   MRN: [id number]  HPI    Review of Systems     Objective:   Physical Exam        Assessment & Plan:  Pt is requesting refill of her abx for acne. This has been prescribed by Derm in the past. We do not have record of med.  I have asked her to contact us with name and dose, and we will send refill.

## 2012-03-09 ENCOUNTER — Telehealth: Payer: Self-pay | Admitting: Family Medicine

## 2012-03-09 DIAGNOSIS — D509 Iron deficiency anemia, unspecified: Secondary | ICD-10-CM

## 2012-03-09 MED ORDER — DOXYCYCLINE HYCLATE 100 MG PO TABS: ORAL_TABLET | ORAL | Status: DC

## 2012-03-09 NOTE — Telephone Encounter (Signed)
Rx has been sent  

## 2012-03-09 NOTE — Telephone Encounter (Signed)
Pt states Melissa told her to call back with the name of Antibiotic she used for acne- Doxycycline 100 mg.

## 2012-03-09 NOTE — Telephone Encounter (Signed)
Patient is calling in stating that she wants to talk to a nurse regarding doxycycline. She states that she has additional info to give to the nurse.

## 2012-03-10 LAB — B. BURGDORFI ANTIBODIES: B burgdorferi Ab IgG+IgM: 0.6 {ISR}

## 2012-03-10 NOTE — Telephone Encounter (Signed)
Pt requesting lab results, please advise.

## 2012-03-11 NOTE — Telephone Encounter (Signed)
Autoimmune testing is negative.  Lyme disease screen negative.  Mild iron deficiency anemia. She should add iron supplement once a day and plan to repeat cbc in 3 months.

## 2012-03-13 NOTE — Telephone Encounter (Signed)
Notified pt and she voices understanding. Future order entered and given to the lab.

## 2012-04-05 ENCOUNTER — Ambulatory Visit: Payer: BC Managed Care – PPO | Admitting: Cardiology

## 2012-04-23 ENCOUNTER — Other Ambulatory Visit: Payer: Self-pay | Admitting: Family

## 2012-05-09 ENCOUNTER — Other Ambulatory Visit: Payer: Self-pay | Admitting: *Deleted

## 2012-05-09 DIAGNOSIS — M797 Fibromyalgia: Secondary | ICD-10-CM

## 2012-05-09 MED ORDER — DESVENLAFAXINE SUCCINATE ER 100 MG PO TB24: 100.0000 mg | ORAL_TABLET | Freq: Every day | ORAL | Status: DC

## 2012-05-09 NOTE — Telephone Encounter (Signed)
RX sent

## 2012-05-16 ENCOUNTER — Telehealth: Payer: Self-pay | Admitting: Family Medicine

## 2012-05-16 DIAGNOSIS — R000 Tachycardia, unspecified: Secondary | ICD-10-CM

## 2012-05-16 DIAGNOSIS — I10 Essential (primary) hypertension: Secondary | ICD-10-CM

## 2012-05-16 MED ORDER — NEBIVOLOL HCL 5 MG PO TABS: 5.0000 mg | ORAL_TABLET | Freq: Every day | ORAL | Status: DC

## 2012-05-16 NOTE — Telephone Encounter (Signed)
RX sent

## 2012-05-16 NOTE — Telephone Encounter (Signed)
Refill- bystolic 5mg  tab. Qty 90 day supply

## 2012-07-07 ENCOUNTER — Ambulatory Visit: Payer: BC Managed Care – PPO | Admitting: Family Medicine

## 2012-07-20 ENCOUNTER — Telehealth: Payer: Self-pay

## 2012-07-20 MED ORDER — ESCITALOPRAM OXALATE 10 MG PO TABS: 10.0000 mg | ORAL_TABLET | Freq: Every day | ORAL | Status: DC

## 2012-07-20 NOTE — Telephone Encounter (Signed)
Pharmacist left a message stating pt needs a 90 day RX for her Lexapro. RX sent to pharmacy

## 2012-07-26 ENCOUNTER — Ambulatory Visit (INDEPENDENT_AMBULATORY_CARE_PROVIDER_SITE_OTHER): Payer: BC Managed Care – PPO | Admitting: Family Medicine

## 2012-07-26 ENCOUNTER — Ambulatory Visit (HOSPITAL_BASED_OUTPATIENT_CLINIC_OR_DEPARTMENT_OTHER)
Admission: RE | Admit: 2012-07-26 | Discharge: 2012-07-26 | Disposition: A | Discharge status: Home / Self Care | Payer: BC Managed Care – PPO | Source: Ambulatory Visit | Attending: Family Medicine | Admitting: Family Medicine

## 2012-07-26 ENCOUNTER — Encounter: Payer: Self-pay | Admitting: Family Medicine

## 2012-07-26 VITALS — BP 124/92 | HR 88 | Temp 98.8°F | Ht 59.25 in | Wt 144.1 lb

## 2012-07-26 DIAGNOSIS — M25572 Pain in left ankle and joints of left foot: Secondary | ICD-10-CM

## 2012-07-26 DIAGNOSIS — D649 Anemia, unspecified: Secondary | ICD-10-CM

## 2012-07-26 DIAGNOSIS — R000 Tachycardia, unspecified: Secondary | ICD-10-CM

## 2012-07-26 DIAGNOSIS — R41840 Attention and concentration deficit: Secondary | ICD-10-CM

## 2012-07-26 DIAGNOSIS — F32A Depression, unspecified: Secondary | ICD-10-CM

## 2012-07-26 DIAGNOSIS — R5383 Other fatigue: Secondary | ICD-10-CM

## 2012-07-26 DIAGNOSIS — R5381 Other malaise: Secondary | ICD-10-CM

## 2012-07-26 DIAGNOSIS — IMO0001 Reserved for inherently not codable concepts without codable children: Secondary | ICD-10-CM

## 2012-07-26 DIAGNOSIS — M79609 Pain in unspecified limb: Secondary | ICD-10-CM | POA: Insufficient documentation

## 2012-07-26 DIAGNOSIS — R52 Pain, unspecified: Secondary | ICD-10-CM

## 2012-07-26 DIAGNOSIS — F329 Major depressive disorder, single episode, unspecified: Secondary | ICD-10-CM

## 2012-07-26 DIAGNOSIS — L708 Other acne: Secondary | ICD-10-CM

## 2012-07-26 DIAGNOSIS — E785 Hyperlipidemia, unspecified: Secondary | ICD-10-CM

## 2012-07-26 DIAGNOSIS — I10 Essential (primary) hypertension: Secondary | ICD-10-CM

## 2012-07-26 DIAGNOSIS — M25579 Pain in unspecified ankle and joints of unspecified foot: Secondary | ICD-10-CM

## 2012-07-26 DIAGNOSIS — Z0000 Encounter for general adult medical examination without abnormal findings: Secondary | ICD-10-CM

## 2012-07-26 DIAGNOSIS — L709 Acne, unspecified: Secondary | ICD-10-CM

## 2012-07-26 DIAGNOSIS — M797 Fibromyalgia: Secondary | ICD-10-CM

## 2012-07-26 LAB — HEPATIC FUNCTION PANEL
AST: 14 U/L (ref 0–37)
Albumin: 4 g/dL (ref 3.5–5.2)
Bilirubin, Direct: 0.1 mg/dL (ref 0.0–0.3)
Total Bilirubin: 0.3 mg/dL (ref 0.3–1.2)

## 2012-07-26 LAB — CBC
MCH: 29.1 pg (ref 26.0–34.0)
MCV: 87.9 fL (ref 78.0–100.0)
Platelets: 319 10*3/uL (ref 150–400)
RBC: 4.13 MIL/uL (ref 3.87–5.11)
RDW: 14.9 % (ref 11.5–15.5)
WBC: 4.2 10*3/uL (ref 4.0–10.5)

## 2012-07-26 LAB — LIPID PANEL
HDL: 72 mg/dL (ref >39)
LDL Cholesterol: 110 mg/dL — ABNORMAL HIGH (ref 0–99)
Total CHOL/HDL Ratio: 2.8 Ratio

## 2012-07-26 LAB — RENAL FUNCTION PANEL
Albumin: 4.1 g/dL (ref 3.5–5.2)
Calcium: 9.5 mg/dL (ref 8.4–10.5)
Chloride: 102 mEq/L (ref 96–112)
Creat: 0.8 mg/dL (ref 0.50–1.10)
Potassium: 4.4 mEq/L (ref 3.5–5.3)

## 2012-07-26 LAB — URIC ACID: Uric Acid, Serum: 4.2 mg/dL (ref 2.4–7.0)

## 2012-07-26 MED ORDER — CLINDAMYCIN PHOS-BENZOYL PEROX 1-5 % EX GEL: Freq: Two times a day (BID) | CUTANEOUS | Status: DC

## 2012-07-26 MED ORDER — GABAPENTIN 300 MG PO CAPS: ORAL_CAPSULE | ORAL | Status: DC

## 2012-07-26 MED ORDER — LIDOCAINE 5 % EX PTCH: 2.0000 | MEDICATED_PATCH | CUTANEOUS | Status: DC

## 2012-07-26 MED ORDER — AMPHETAMINE-DEXTROAMPHET ER 20 MG PO CP24: 20.0000 mg | ORAL_CAPSULE | ORAL | Status: DC

## 2012-07-26 MED ORDER — DESVENLAFAXINE SUCCINATE ER 50 MG PO TB24
100.0000 mg | ORAL_TABLET | Freq: Every day | ORAL | Status: DC
Start: 2012-07-26 — End: 2013-01-16

## 2012-07-26 MED ORDER — TRAMADOL HCL 50 MG PO TABS: ORAL_TABLET | ORAL | Status: DC

## 2012-07-26 NOTE — Progress Notes (Signed)
Patient ID: Lauren Hall, female   DOB: [birth year], 42 y.o.   MRN: [id number] Lauren Hall [id number] 1971-01-01 07/26/2012      Progress Note-Follow Up  Subjective  Chief Complaint  Chief Complaint  Patient presents with  . Annual Exam    physical    HPI  Patient is a 42 year old American female in today for. Continues to struggle with increasing stress in her job and as a result increasing trouble to her fibromyalgia. Struggles with daily chronic diffuse pain and fatigue. Medications to help her manage that somewhat. Over the last month she's been having worsening left foot pain. Topical treatments such as Lidoderm patches have been helpful. He has been present about 2 months but she denies any acute injury. Does have some numbness into her toes at times when the pain is severe. Notes mild low back pain but this is not worsening. Does not have shooting pains down the legs. Has not had any acute illness. Denies fevers or chills. Denies chest pain, palpitations, shortness of breath, GI or GU concerns.  Past Medical History  Diagnosis Date  . Anxiety   . Fibromyalgia 2009  . Fibromyalgia   . Anemia   . Allergy   . Depression   . Hypertension   . Fatigue 10/08/2010  . Headache(784.0) 10/08/2010  . Thyroid disease 10/08/2010  . Hip pain, bilateral 11/06/2010  . Poor concentration 01/11/2011  . Bronchitis 07/27/2011  . Tick bite of flank 11/09/2011    Past Surgical History  Procedure Laterality Date  . Breast lumpectomy  1999    right- benign    Family History  Problem Relation Age of Onset  . Fibromyalgia Mother   . Diabetes Mother     type 2  . Hypertension Mother   . Hyperlipidemia Mother   . Alcohol abuse Father   . Cancer Father     colon  . Hypertension Brother   . Heart disease Maternal Grandmother   . Hyperlipidemia Maternal Grandmother   . Diabetes Maternal Grandmother     type 2  . Cancer Maternal Grandmother     colon  . Stroke Maternal Grandfather    . Diabetes Maternal Grandfather     type 2  . Cancer Paternal Grandmother     ovarian  . Alzheimer's disease Paternal Grandfather   . Alcohol abuse Brother     History   Social History  . Marital Status: Married    Spouse Name: N/A    Number of Children: N/A  . Years of Education: N/A   Occupational History  . Not on file.   Social History Main Topics  . Smoking status: Never Smoker   . Smokeless tobacco: Never Used  . Alcohol Use: Yes     Comment: special occassion- very rarely  . Drug Use: No  . Sexually Active: Yes -- Female partner(s)   Other Topics Concern  . Not on file   Social History Narrative  . No narrative on file    Current Outpatient Prescriptions on File Prior to Visit  Medication Sig Dispense Refill  . buPROPion (WELLBUTRIN) 100 MG tablet Take 1 tablet (100 mg total) by mouth 2 (two) times daily.  180 tablet  1  . Cholecalciferol (VITAMIN D) 2000 UNITS CAPS Take 2,000 capsules by mouth 2 (two) times daily.        Marland Kitchen EPINEPHrine (EPI-PEN) 0.3 mg/0.3 mL DEVI Inject 0.3 mg into the muscle once.      . escitalopram (LEXAPRO) 10  MG tablet Take 1 tablet (10 mg total) by mouth daily.  90 tablet  1  . ferrous sulfate 325 (65 FE) MG tablet Take 325 mg by mouth daily with breakfast.      . fish oil-omega-3 fatty acids 1000 MG capsule MegaRed 1 cap by mouth daily (by Schiff)  30 capsule  1  . Levonorgestrel-Ethinyl Estradiol (CAMRESE) 0.15-0.03 &0.01 MG tablet Take 1 tablet by mouth daily.        Marland Kitchen lisinopril (PRINIVIL,ZESTRIL) 10 MG tablet Take 1 tablet (10 mg total) by mouth 2 (two) times daily.  180 tablet  2  . nebivolol (BYSTOLIC) 5 MG tablet Take 1 tablet (5 mg total) by mouth daily.  90 tablet  1  . NON FORMULARY Allergy shots      . RITALIN LA 30 MG 24 hr capsule Take 1 capsule (30 mg total) by mouth every morning. December 2013 rx  30 capsule  0  . Tretinoin-Cleanser-Moisturizer 0.025 % CREAM KIT Apply topically at bedtime.        . Wheat Dextrin  (BENEFIBER) POWD 2 tsp po daily to bid prn constipation, in 6-8 oz of fluid  1 Can  3   No current facility-administered medications on file prior to visit.    Allergies  Allergen Reactions  . Promethazine Anaphylaxis    Review of Systems  Review of Systems  Constitutional: Positive for malaise/fatigue. Negative for fever.  HENT: Negative for congestion.   Eyes: Negative for discharge.  Respiratory: Negative for shortness of breath.   Cardiovascular: Negative for chest pain, palpitations and leg swelling.  Gastrointestinal: Negative for nausea, abdominal pain and diarrhea.  Genitourinary: Negative for dysuria.  Musculoskeletal: Positive for back pain and joint pain. Negative for falls.  Skin: Negative for rash.  Neurological: Negative for loss of consciousness and headaches.  Endo/Heme/Allergies: Negative for polydipsia.  Psychiatric/Behavioral: Negative for depression and suicidal ideas. The patient is not nervous/anxious and does not have insomnia.     Objective  BP 124/100  Pulse 88  Temp(Src) 98.8 F (37.1 C) (Oral)  Ht 4' 11.25" (1.505 m)  Wt 144 lb 1.3 oz (65.354 kg)  BMI 28.85 kg/m2  SpO2 97%  LMP 07/12/2012  Physical Exam  Physical Exam  Constitutional: She is oriented to person, place, and time and well-developed, well-nourished, and in no distress. No distress.  HENT:  Head: Normocephalic and atraumatic.  Right Ear: External ear normal.  Left Ear: External ear normal.  Nose: Nose normal.  Mouth/Throat: Oropharynx is clear and moist. No oropharyngeal exudate.  Eyes: Conjunctivae are normal. Pupils are equal, round, and reactive to light. Right eye exhibits no discharge. Left eye exhibits no discharge. No scleral icterus.  Neck: Normal range of motion. Neck supple. No thyromegaly present.  Cardiovascular: Normal rate, regular rhythm and intact distal pulses.  Exam reveals no gallop.   No murmur heard. Pulmonary/Chest: Effort normal and breath sounds  normal. No respiratory distress. She has no wheezes. She has no rales.  Abdominal: Soft. Bowel sounds are normal. She exhibits no distension and no mass. There is no tenderness.  Musculoskeletal: Normal range of motion. She exhibits no edema and no tenderness.  Lymphadenopathy:    She has no cervical adenopathy.  Neurological: She is alert and oriented to person, place, and time. She has normal reflexes. No cranial nerve deficit. Coordination normal.  Skin: Skin is warm and dry. No rash noted. She is not diaphoretic.  Psychiatric: Mood, memory and affect normal.    Lab  Results  Component Value Date   TSH 0.35 03/02/2011   Lab Results  Component Value Date   WBC 7.0 03/07/2012   HGB 11.2* 03/07/2012   HCT 33.7* 03/07/2012   MCV 85.5 03/07/2012   PLT 405* 03/07/2012   Lab Results  Component Value Date   CREATININE 0.7 03/02/2011   BUN 5* 03/02/2011   NA 138 03/02/2011   K 3.9 03/02/2011   CL 102 03/02/2011   CO2 29 03/02/2011   Lab Results  Component Value Date   ALT 17 10/07/2010   AST 18 10/07/2010   ALKPHOS 43 10/07/2010   BILITOT 0.5 10/07/2010   Lab Results  Component Value Date   CHOL 175 10/07/2010   Lab Results  Component Value Date   HDL 70.30 10/07/2010   Lab Results  Component Value Date   LDLCALC 86 10/07/2010   Lab Results  Component Value Date   TRIG 96.0 10/07/2010   Lab Results  Component Value Date   CHOLHDL 2 10/07/2010     Assessment & Plan  Fibromyalgia Chronic pain and fatigue still present. increased stress at work has not helped. No change in meds today  Hypertension Well controlled no changes  Anemia resolved  Poor concentration Ritalin LA helpful  Tachycardia Controlled on current meds  Other and unspecified hyperlipidemia Mild, avoid trans fats, consider krill oil caps daily  Preventative health care Encouraged heart healthy, regular exercise, stress reduction. Fasting labs reviewed

## 2012-07-26 NOTE — Patient Instructions (Addendum)
Ice, Aspercreme Dr Margart Sickles inserts  Acne Acne is a skin problem that causes pimples. Acne occurs when the pores in your skin get blocked. Your pores may become red, sore, and swollen (inflamed), or infected with a common skin bacterium (Propionibacterium acnes). Acne is a common skin problem. Up to 80% of people get acne at some time. Acne is especially common from the ages of 34 to 7. Acne usually goes away over time with proper treatment. CAUSES  Your pores each contain an oil gland. The oil glands make an oily substance called sebum. Acne happens when these glands get plugged with sebum, dead skin cells, and dirt. The P. acnes bacteria that are normally found in the oil glands then multiply, causing inflammation. Acne is commonly triggered by changes in your hormones. These hormonal changes can cause the oil glands to get bigger and to make more sebum. Factors that can make acne worse include:  Hormone changes during adolescence.  Hormone changes during women's menstrual cycles.  Hormone changes during pregnancy.  Oil-based cosmetics and hair products.  Harshly scrubbing the skin.  Strong soaps.  Stress.  Hormone problems due to certain diseases.  Long or oily hair rubbing against the skin.  Certain medicines.  Pressure from headbands, backpacks, or shoulder pads.  Exposure to certain oils and chemicals. SYMPTOMS  Acne often occurs on the face, neck, chest, and upper back. Symptoms include:  Small, red bumps (pimples or papules).  Whiteheads (closed comedones).  Blackheads (open comedones).  Small, pus-filled pimples (pustules).  Big, red pimples or pustules that feel tender. More severe acne can cause:  An infected area that contains a collection of pus (abscess).  Hard, painful, fluid-filled sacs (cysts).  Scars. DIAGNOSIS  Your caregiver can usually tell what the problem is by doing a physical exam. TREATMENT  There are many good treatments for acne. Some  are available over-the-counter and some are available with a prescription. The treatment that is best for you depends on the type of acne you have and how severe it is. It may take 2 months of treatment before your acne gets better. Common treatments include:  Creams and lotions that prevent oil glands from clogging.  Creams and lotions that treat or prevent infections and inflammation.  Antibiotics applied to the skin or taken as a pill.  Pills that decrease sebum production.  Birth control pills.  Light or laser treatments.  Minor surgery.  Injections of medicine into the affected areas.  Chemicals that cause peeling of the skin. HOME CARE INSTRUCTIONS  Good skin care is the most important part of treatment.  Wash your skin gently at least twice a day and after exercise. Always wash your skin before bed.  Use mild soap.  After each wash, apply a water-based skin moisturizer.  Keep your hair clean and off of your face. Shampoo your hair daily.  Only take medicines as directed by your caregiver.  Use a sunscreen or sunblock with SPF 30 or greater. This is especially important when you are using acne medicines.  Choose cosmetics that are noncomedogenic. This means they do not plug the oil glands.  Avoid leaning your chin or forehead on your hands.  Avoid wearing tight headbands or hats.  Avoid picking or squeezing your pimples. This can make your acne worse and cause scarring. SEEK MEDICAL CARE IF:   Your acne is not better after 8 weeks.  Your acne gets worse.  You have a large area of skin that is red  or tender. Document Released: 01/16/2000 Document Revised: 04/12/2011 Document Reviewed: 11/06/2010 [facility] Patient Information 2014 [place], [place].

## 2012-07-29 ENCOUNTER — Encounter: Payer: Self-pay | Admitting: Family Medicine

## 2012-07-29 DIAGNOSIS — E785 Hyperlipidemia, unspecified: Secondary | ICD-10-CM

## 2012-07-29 DIAGNOSIS — Z0000 Encounter for general adult medical examination without abnormal findings: Secondary | ICD-10-CM

## 2012-07-29 HISTORY — DX: Hyperlipidemia, unspecified: E78.5

## 2012-07-29 HISTORY — DX: Encounter for general adult medical examination without abnormal findings: Z00.00

## 2012-07-29 NOTE — Assessment & Plan Note (Signed)
Chronic pain and fatigue still present. increased stress at work has not helped. No change in meds today

## 2012-07-29 NOTE — Assessment & Plan Note (Signed)
Well controlled no changes 

## 2012-07-29 NOTE — Assessment & Plan Note (Signed)
resolved 

## 2012-07-29 NOTE — Assessment & Plan Note (Signed)
Ritalin LA helpful

## 2012-07-29 NOTE — Assessment & Plan Note (Signed)
Encouraged heart healthy, regular exercise, stress reduction. Fasting labs reviewed

## 2012-07-29 NOTE — Assessment & Plan Note (Signed)
Mild, avoid trans fats, consider krill oil caps daily

## 2012-07-29 NOTE — Assessment & Plan Note (Signed)
Controlled on current meds.

## 2012-08-14 ENCOUNTER — Telehealth: Payer: Self-pay | Admitting: *Deleted

## 2012-08-14 NOTE — Telephone Encounter (Signed)
Received fax for Prior Authorization for: Adderall 20 mg XR: pt had 06.25.14 Annual Exam; medication shows pt's EMR medication list history as Ordered & D/C on 06.25.14, but is listed on AVS on active medication list. Ritalin 30 mg 24 Hr capsule is on pt's active EMR medication list with last Rx as 02.04.14 and notes: December 2013 Rx; and is also listed on 06.25.14 AVS on active medication list/SLS Please advise if Prior Authorization is needed for Adderall, or if pt is actively taking Ritalin instead. Thanks.

## 2012-08-14 NOTE — Telephone Encounter (Signed)
I think the patient is on the Ritalin but please clarify before proceeding with any prior auth.

## 2012-08-15 NOTE — Telephone Encounter (Signed)
Pt states she is no longer taking Ritalin. The pa needs to be done for the Adderall

## 2012-08-24 NOTE — Telephone Encounter (Signed)
Spoke w/Tomeka rep with [facility] for prior authorization on Adderall 20 mg XR at [phone]; request will be forwarded to pharmacist for review and we will receive fax determination w/i 24-48 hrs/SLS

## 2012-08-25 ENCOUNTER — Ambulatory Visit: Payer: BC Managed Care – PPO | Admitting: Family Medicine

## 2012-08-25 DIAGNOSIS — Z0289 Encounter for other administrative examinations: Secondary | ICD-10-CM

## 2012-08-30 NOTE — Telephone Encounter (Signed)
Received fax/letter with [facility] for pt's Adderall RX d/t "pt does not have ADHD, or narcolepsy confirmed by a sleep study"; per provider request [name] will Hold for pt appointment/SLS

## 2012-11-02 ENCOUNTER — Other Ambulatory Visit: Payer: Self-pay | Admitting: *Deleted

## 2012-11-02 DIAGNOSIS — I10 Essential (primary) hypertension: Secondary | ICD-10-CM

## 2012-11-02 MED ORDER — LISINOPRIL 10 MG PO TABS: 10.0000 mg | ORAL_TABLET | Freq: Two times a day (BID) | ORAL | Status: DC

## 2012-11-02 NOTE — Telephone Encounter (Signed)
Rx request to pharmacy/SLS  

## 2012-11-03 ENCOUNTER — Telehealth: Payer: Self-pay | Admitting: *Deleted

## 2012-11-03 ENCOUNTER — Telehealth: Payer: Self-pay | Admitting: Family Medicine

## 2012-11-03 DIAGNOSIS — I10 Essential (primary) hypertension: Secondary | ICD-10-CM

## 2012-11-03 DIAGNOSIS — R000 Tachycardia, unspecified: Secondary | ICD-10-CM

## 2012-11-03 MED ORDER — LISINOPRIL 10 MG PO TABS: 10.0000 mg | ORAL_TABLET | Freq: Two times a day (BID) | ORAL | Status: DC

## 2012-11-03 MED ORDER — NEBIVOLOL HCL 5 MG PO TABS: 5.0000 mg | ORAL_TABLET | Freq: Every day | ORAL | Status: DC

## 2012-11-03 NOTE — Telephone Encounter (Signed)
Pharmacy states that patient "had [2] refills remaining on Tramadol left, but was inactivated when Rx went controlled"/SLS

## 2012-11-03 NOTE — Telephone Encounter (Signed)
Opened in error

## 2012-11-03 NOTE — Telephone Encounter (Addendum)
FYI to PCP: Pt called RE: Tramadol px and was informed that medication has become a Class II controlled drug with new restrictions & since she No Show her 07.25.14 f/u appt [w/o future appt scheduled] & her PCP is out of the office today, in office provider will not give refills; she will have to obtain via her PCP. Pt scheduled f/u OV for Mon, 10.06.14 w/PCP when given this information. Patient's spouse [Derrick] called & LMOM with very rude tone stating that "someone had better call them back IMMEDIATELY & he means IMMEDIATELY; will not engage this behavior/SLS

## 2012-11-03 NOTE — Telephone Encounter (Signed)
Pt is past due for follow up.  No showed 4 week follow up visit.  Needs office visit prior to additional refills.

## 2012-11-03 NOTE — Telephone Encounter (Signed)
Pharmacy informed of provider instructions, will inform pt/SLS

## 2012-11-03 NOTE — Telephone Encounter (Signed)
Faxed refill request received from pharmacy for Tramadol 50 mg Last filled by MD on 06.25.14, #[id number] Last AEX - 06.25.14 Next AEX - 4 weeks; No Show on 07.25.14 & No future appt schedule Please Advise/SLS

## 2012-11-03 NOTE — Telephone Encounter (Signed)
Pt left a message for Tramadol refill and BP medication refilled.  I will send RX for BP med

## 2012-11-05 NOTE — Telephone Encounter (Signed)
She can have a small prescription of Tramadol same sig # 30 but since it is now more controlled she will need to come in for follow up to sign a contract and get refills

## 2012-11-06 ENCOUNTER — Encounter: Payer: Self-pay | Admitting: Family Medicine

## 2012-11-06 ENCOUNTER — Ambulatory Visit (INDEPENDENT_AMBULATORY_CARE_PROVIDER_SITE_OTHER): Payer: BC Managed Care – PPO | Admitting: Family Medicine

## 2012-11-06 VITALS — BP 120/92 | HR 96 | Temp 98.2°F | Ht 59.25 in | Wt 147.1 lb

## 2012-11-06 DIAGNOSIS — R5381 Other malaise: Secondary | ICD-10-CM

## 2012-11-06 DIAGNOSIS — Z23 Encounter for immunization: Secondary | ICD-10-CM

## 2012-11-06 DIAGNOSIS — E079 Disorder of thyroid, unspecified: Secondary | ICD-10-CM

## 2012-11-06 DIAGNOSIS — R52 Pain, unspecified: Secondary | ICD-10-CM

## 2012-11-06 DIAGNOSIS — R000 Tachycardia, unspecified: Secondary | ICD-10-CM

## 2012-11-06 DIAGNOSIS — G609 Hereditary and idiopathic neuropathy, unspecified: Secondary | ICD-10-CM

## 2012-11-06 DIAGNOSIS — D649 Anemia, unspecified: Secondary | ICD-10-CM

## 2012-11-06 DIAGNOSIS — B354 Tinea corporis: Secondary | ICD-10-CM

## 2012-11-06 DIAGNOSIS — R5383 Other fatigue: Secondary | ICD-10-CM

## 2012-11-06 DIAGNOSIS — I10 Essential (primary) hypertension: Secondary | ICD-10-CM

## 2012-11-06 DIAGNOSIS — R41840 Attention and concentration deficit: Secondary | ICD-10-CM

## 2012-11-06 DIAGNOSIS — IMO0001 Reserved for inherently not codable concepts without codable children: Secondary | ICD-10-CM

## 2012-11-06 DIAGNOSIS — M797 Fibromyalgia: Secondary | ICD-10-CM

## 2012-11-06 DIAGNOSIS — G629 Polyneuropathy, unspecified: Secondary | ICD-10-CM

## 2012-11-06 MED ORDER — CLOTRIMAZOLE-BETAMETHASONE 1-0.05 % EX CREA: TOPICAL_CREAM | Freq: Two times a day (BID) | CUTANEOUS | Status: DC

## 2012-11-06 MED ORDER — RITALIN LA 30 MG PO CP24: 30.0000 mg | ORAL_CAPSULE | ORAL | Status: DC

## 2012-11-06 MED ORDER — METHOCARBAMOL 500 MG PO TABS: 500.0000 mg | ORAL_TABLET | Freq: Two times a day (BID) | ORAL | Status: DC | PRN

## 2012-11-06 MED ORDER — TRAMADOL HCL 50 MG PO TABS: ORAL_TABLET | ORAL | Status: DC

## 2012-11-06 MED ORDER — NEBIVOLOL HCL 5 MG PO TABS: 5.0000 mg | ORAL_TABLET | Freq: Every day | ORAL | Status: DC

## 2012-11-06 MED ORDER — METHYLPHENIDATE HCL ER (LA) 30 MG PO CP24: 30.0000 mg | ORAL_CAPSULE | ORAL | Status: DC

## 2012-11-06 NOTE — Progress Notes (Signed)
Patient ID: Lauren Hall, female   DOB: January 12, 1971, 42 y.o.   MRN: [id number] GAE BIHL [id number] 1970-08-18 11/06/2012      Progress Note-Follow Up  Subjective  Chief Complaint  Chief Complaint  Patient presents with  . Medication Refill    Tramadol  . Injections    flu    HPI  Patient is a 42 year old American female who is in today for followup on medications. She bilateral foraminal secondary to the pharmacy not bothering her refill. She had a hard time coming off with anxiety tremulousness but now that she is off his staff. Does acknowledge her pain is worsened in her peripheral neuropathy in her feet has increased in intensity and is tingling and burning, she also has a scaly itchy rash on her ankle. She has tried some over-the-counter hydrocortisone with some temporary relief. Continues to struggle with fatigue and difficulty concentrating and feeling sleepy. No chest pain or palpitations. No shortness or breath, GI or GU complaints.  Past Medical History  Diagnosis Date  . Anxiety   . Fibromyalgia 2009  . Fibromyalgia   . Anemia   . Allergy   . Depression   . Hypertension   . Fatigue 10/08/2010  . Headache(784.0) 10/08/2010  . Thyroid disease 10/08/2010  . Hip pain, bilateral 11/06/2010  . Poor concentration 01/11/2011  . Bronchitis 07/27/2011  . Tick bite of flank 11/09/2011  . Other and unspecified hyperlipidemia 07/29/2012  . Preventative health care 07/29/2012    Past Surgical History  Procedure Laterality Date  . Breast lumpectomy  1999    right- benign    Family History  Problem Relation Age of Onset  . Fibromyalgia Mother   . Diabetes Mother     type 2  . Hypertension Mother   . Hyperlipidemia Mother   . Alcohol abuse Father   . Cancer Father     colon  . Hypertension Brother   . Heart disease Maternal Grandmother   . Hyperlipidemia Maternal Grandmother   . Diabetes Maternal Grandmother     type 2  . Cancer Maternal Grandmother     colon   . Stroke Maternal Grandfather   . Diabetes Maternal Grandfather     type 2  . Cancer Paternal Grandmother     ovarian  . Alzheimer's disease Paternal Grandfather   . Alcohol abuse Brother     History   Social History  . Marital Status: Married    Spouse Name: N/A    Number of Children: N/A  . Years of Education: N/A   Occupational History  . Not on file.   Social History Main Topics  . Smoking status: Never Smoker   . Smokeless tobacco: Never Used  . Alcohol Use: Yes     Comment: special occassion- very rarely  . Drug Use: No  . Sexual Activity: Yes    Partners: Male   Other Topics Concern  . Not on file   Social History Narrative  . No narrative on file    Current Outpatient Prescriptions on File Prior to Visit  Medication Sig Dispense Refill  . buPROPion (WELLBUTRIN) 100 MG tablet Take 1 tablet (100 mg total) by mouth 2 (two) times daily.  180 tablet  1  . Cholecalciferol (VITAMIN D) 2000 UNITS CAPS Take 2,000 capsules by mouth 2 (two) times daily.        . clindamycin-benzoyl peroxide (BENZACLIN) gel Apply topically 2 (two) times daily.  50 g  2  . desvenlafaxine (PRISTIQ)  50 MG 24 hr tablet Take 2 tablets (100 mg total) by mouth daily.  60 tablet  5  . EPINEPHrine (EPI-PEN) 0.3 mg/0.3 mL DEVI Inject 0.3 mg into the muscle once.      . escitalopram (LEXAPRO) 10 MG tablet Take 1 tablet (10 mg total) by mouth daily.  90 tablet  1  . ferrous sulfate 325 (65 FE) MG tablet Take 325 mg by mouth daily with breakfast.      . fish oil-omega-3 fatty acids 1000 MG capsule MegaRed 1 cap by mouth daily (by Schiff)  30 capsule  1  . gabapentin (NEURONTIN) 300 MG capsule 1 tab po bid and 2 tabs po qhs  360 capsule  1  . Levonorgestrel-Ethinyl Estradiol (CAMRESE) 0.15-0.03 &0.01 MG tablet Take 1 tablet by mouth daily.        Marland Kitchen lidocaine (LIDODERM) 5 % Place 2 patches onto the skin daily. Remove & Discard patch within 12 hours or as directed by MD  60 patch  3  . lisinopril  (PRINIVIL,ZESTRIL) 10 MG tablet Take 1 tablet (10 mg total) by mouth 2 (two) times daily.  180 tablet  0  . NON FORMULARY Allergy shots      . Tretinoin-Cleanser-Moisturizer 0.025 % CREAM KIT Apply topically at bedtime.        . Wheat Dextrin (BENEFIBER) POWD 2 tsp po daily to bid prn constipation, in 6-8 oz of fluid  1 Can  3   No current facility-administered medications on file prior to visit.    Allergies  Allergen Reactions  . Promethazine Anaphylaxis    Review of Systems  Review of Systems  Constitutional: Positive for malaise/fatigue. Negative for fever.  HENT: Negative for congestion.   Eyes: Negative for discharge.  Respiratory: Negative for shortness of breath.   Cardiovascular: Negative for chest pain, palpitations and leg swelling.  Gastrointestinal: Negative for nausea, abdominal pain and diarrhea.  Genitourinary: Negative for dysuria.  Musculoskeletal: Positive for myalgias, back pain and joint pain. Negative for falls.  Skin: Positive for rash.  Neurological: Negative for loss of consciousness and headaches.  Endo/Heme/Allergies: Negative for polydipsia.  Psychiatric/Behavioral: Negative for depression and suicidal ideas. The patient is nervous/anxious. The patient does not have insomnia.     Objective  BP 120/92  Pulse 120  Temp(Src) 98.2 F (36.8 C) (Oral)  Ht 4' 11.25" (1.505 m)  Wt 147 lb 1.3 oz (66.715 kg)  BMI 29.45 kg/m2  SpO2 97%  LMP 10/16/2012  Physical Exam  Physical Exam  Constitutional: She is oriented to person, place, and time and well-developed, well-nourished, and in no distress. No distress.  HENT:  Head: Normocephalic and atraumatic.  Eyes: Conjunctivae are normal.  Neck: Neck supple. No thyromegaly present.  Cardiovascular: Normal rate, regular rhythm and normal heart sounds.   No murmur heard. Pulmonary/Chest: Effort normal and breath sounds normal. She has no wheezes.  Abdominal: She exhibits no distension and no mass.   Musculoskeletal: She exhibits no edema.  Lymphadenopathy:    She has no cervical adenopathy.  Neurological: She is alert and oriented to person, place, and time.  Skin: Skin is warm and dry. Rash noted. She is not diaphoretic.  Rash oval over malleolus left foot  Psychiatric: Memory, affect and judgment normal.    Lab Results  Component Value Date   TSH 0.920 07/26/2012   Lab Results  Component Value Date   WBC 4.2 07/26/2012   HGB 12.0 07/26/2012   HCT 36.3 07/26/2012   MCV  87.9 07/26/2012   PLT 319 07/26/2012   Lab Results  Component Value Date   CREATININE 0.80 07/26/2012   BUN 5* 07/26/2012   NA 138 07/26/2012   K 4.4 07/26/2012   CL 102 07/26/2012   CO2 30 07/26/2012   Lab Results  Component Value Date   ALT 15 07/26/2012   AST 14 07/26/2012   ALKPHOS 84 07/26/2012   BILITOT 0.3 07/26/2012   Lab Results  Component Value Date   CHOL 202* 07/26/2012   Lab Results  Component Value Date   HDL 72 07/26/2012   Lab Results  Component Value Date   LDLCALC 110* 07/26/2012   Lab Results  Component Value Date   TRIG 100 07/26/2012   Lab Results  Component Value Date   CHOLHDL 2.8 07/26/2012     Assessment & Plan  Hypertension Well controlled at today's visit. No changes.   Thyroid disease Thyroid studies normal at last check. Will continue to monitor  Anemia Resolved at last check  Tachycardia Mildly elevated as she comes off Tramadol  Fibromyalgia Has stopped Tramadol and agrees to stay off at this time.  Peripheral neuropathy Try Salon Pas cream to feet where the symptoms are the worst  Tinea corporis Vs eczema. Will start with Lotrisone bid and if no improvement may need referral to dermatology

## 2012-11-06 NOTE — Telephone Encounter (Signed)
Pt coming in for an appt today. Will refill then

## 2012-11-06 NOTE — Patient Instructions (Addendum)
Salon Pas cream/patch as needed Jobst stockings 10 to 20 mm HG, light weight knee high on in am off in pm   Fibromyalgia Fibromyalgia is a disorder that is often misunderstood. It is associated with muscular pains and tenderness that comes and goes. It is often associated with fatigue and sleep disturbances. Though it tends to be long-lasting, fibromyalgia is not life-threatening. CAUSES  The exact cause of fibromyalgia is unknown. People with certain gene types are predisposed to developing fibromyalgia and other conditions. Certain factors can play a role as triggers, such as:  Spine disorders.  Arthritis.  Severe injury (trauma) and other physical stressors.  Emotional stressors. SYMPTOMS   The main symptom is pain and stiffness in the muscles and joints, which can vary over time.  Sleep and fatigue problems. Other related symptoms may include:  Bowel and bladder problems.  Headaches.  Visual problems.  Problems with odors and noises.  Depression or mood changes.  Painful periods (dysmenorrhea).  Dryness of the skin or eyes. DIAGNOSIS  There are no specific tests for diagnosing fibromyalgia. Patients can be diagnosed accurately from the specific symptoms they have. The diagnosis is made by determining that nothing else is causing the problems. TREATMENT  There is no cure. Management includes medicines and an active, healthy lifestyle. The goal is to enhance physical fitness, decrease pain, and improve sleep. HOME CARE INSTRUCTIONS   Only take over-the-counter or prescription medicines as directed by your caregiver. Sleeping pills, tranquilizers, and pain medicines may make your problems worse.  Low-impact aerobic exercise is very important and advised for treatment. At first, it may seem to make pain worse. Gradually increasing your tolerance will overcome this feeling.  Learning relaxation techniques and how to control stress will help you. Biofeedback, visual  imagery, hypnosis, muscle relaxation, yoga, and meditation are all options.  Anti-inflammatory medicines and physical therapy may provide short-term help.  Acupuncture or massage treatments may help.  Take muscle relaxant medicines as suggested by your caregiver.  Avoid stressful situations.  Plan a healthy lifestyle. This includes your diet, sleep, rest, exercise, and friends.  Find and practice a hobby you enjoy.  Join a fibromyalgia support group for interaction, ideas, and sharing advice. This may be helpful. SEEK MEDICAL CARE IF:  You are not having good results or improvement from your treatment. FOR MORE INFORMATION  National Fibromyalgia Association: www.fmaware.org Arthritis Foundation: www.arthritis.org Document Released: 01/18/2005 Document Revised: 04/12/2011 Document Reviewed: 04/30/2009 [facility] Patient Information 2014 [place], [place].

## 2012-11-11 ENCOUNTER — Encounter: Payer: Self-pay | Admitting: Family Medicine

## 2012-11-11 DIAGNOSIS — B354 Tinea corporis: Secondary | ICD-10-CM

## 2012-11-11 DIAGNOSIS — G629 Polyneuropathy, unspecified: Secondary | ICD-10-CM | POA: Insufficient documentation

## 2012-11-11 HISTORY — DX: Tinea corporis: B35.4

## 2012-11-11 HISTORY — DX: Polyneuropathy, unspecified: G62.9

## 2012-11-11 NOTE — Assessment & Plan Note (Signed)
Has stopped Tramadol and agrees to stay off at this time.

## 2012-11-11 NOTE — Assessment & Plan Note (Signed)
Try Salon Pas cream to feet where the symptoms are the worst

## 2012-11-11 NOTE — Assessment & Plan Note (Signed)
Mildly elevated as she comes off Tramadol

## 2012-11-11 NOTE — Assessment & Plan Note (Signed)
Thyroid studies normal at last check. Will continue to monitor

## 2012-11-11 NOTE — Assessment & Plan Note (Signed)
Well controlled at today's visit. No changes 

## 2012-11-11 NOTE — Assessment & Plan Note (Signed)
Vs eczema. Will start with Lotrisone bid and if no improvement may need referral to dermatology

## 2012-11-11 NOTE — Assessment & Plan Note (Signed)
Resolved at last check. 

## 2012-12-04 ENCOUNTER — Ambulatory Visit: Payer: BC Managed Care – PPO | Admitting: Family Medicine

## 2012-12-04 DIAGNOSIS — Z0289 Encounter for other administrative examinations: Secondary | ICD-10-CM

## 2013-01-01 ENCOUNTER — Telehealth: Payer: Self-pay

## 2013-01-01 MED ORDER — DESVENLAFAXINE ER 50 MG PO TB24: 50.0000 mg | ORAL_TABLET | Freq: Every day | ORAL | Status: DC

## 2013-01-01 NOTE — Telephone Encounter (Signed)
Pharmacy sent a fax stating that Pristiq doesn't have any generic medication but Desvenlafaxine ER is available is not interchangeable with Pristiq.  Verbal per MD ok to change to 50 mg po qd disp 30 with 3 refills

## 2013-01-02 ENCOUNTER — Ambulatory Visit: Payer: BC Managed Care – PPO | Admitting: Family Medicine

## 2013-01-11 ENCOUNTER — Telehealth: Payer: Self-pay | Admitting: Family Medicine

## 2013-01-11 NOTE — Telephone Encounter (Signed)
Prior auth form received for pristiq er 50mg  tab take 2 tabs by mouth daily, Qty 60. Form foward to nurse.

## 2013-01-11 NOTE — Telephone Encounter (Signed)
PA for Pristiq has been faxed

## 2013-01-12 NOTE — Telephone Encounter (Signed)
Insurance approved pristiq from 12/12/201-01/13/2015, letter sent to md

## 2013-01-16 ENCOUNTER — Ambulatory Visit (INDEPENDENT_AMBULATORY_CARE_PROVIDER_SITE_OTHER): Payer: BC Managed Care – PPO | Admitting: Family Medicine

## 2013-01-16 ENCOUNTER — Encounter: Payer: Self-pay | Admitting: Family Medicine

## 2013-01-16 ENCOUNTER — Telehealth: Payer: Self-pay | Admitting: Family Medicine

## 2013-01-16 VITALS — BP 130/90 | HR 99 | Temp 98.2°F | Ht 59.25 in | Wt 147.1 lb

## 2013-01-16 DIAGNOSIS — I10 Essential (primary) hypertension: Secondary | ICD-10-CM

## 2013-01-16 DIAGNOSIS — IMO0001 Reserved for inherently not codable concepts without codable children: Secondary | ICD-10-CM

## 2013-01-16 DIAGNOSIS — E079 Disorder of thyroid, unspecified: Secondary | ICD-10-CM

## 2013-01-16 DIAGNOSIS — Z0000 Encounter for general adult medical examination without abnormal findings: Secondary | ICD-10-CM

## 2013-01-16 DIAGNOSIS — M25559 Pain in unspecified hip: Secondary | ICD-10-CM

## 2013-01-16 DIAGNOSIS — E785 Hyperlipidemia, unspecified: Secondary | ICD-10-CM

## 2013-01-16 DIAGNOSIS — R52 Pain, unspecified: Secondary | ICD-10-CM

## 2013-01-16 DIAGNOSIS — M25551 Pain in right hip: Secondary | ICD-10-CM

## 2013-01-16 DIAGNOSIS — R000 Tachycardia, unspecified: Secondary | ICD-10-CM

## 2013-01-16 DIAGNOSIS — M797 Fibromyalgia: Secondary | ICD-10-CM

## 2013-01-16 MED ORDER — GABAPENTIN 300 MG PO CAPS: ORAL_CAPSULE | ORAL | Status: DC

## 2013-01-16 NOTE — Assessment & Plan Note (Addendum)
Was using Tramadol has come off and does not want to restart is tolerating her pain, encouraged to stay active and report worsening symptoms

## 2013-01-16 NOTE — Telephone Encounter (Signed)
Lab order placed.

## 2013-01-16 NOTE — Assessment & Plan Note (Signed)
Well controlled, no changes 

## 2013-01-16 NOTE — Patient Instructions (Addendum)
Curcumin (Turmeric) daily for inflammation Loratadine 10 mg in am Ranitidine at bed 150 mg daily 64 oz plus of fluids

## 2013-01-16 NOTE — Telephone Encounter (Signed)
Lab order week of 07-02-2013 Annual exam with labs prior to visit, lipid, renal, cbc, hepatic, tsh

## 2013-01-16 NOTE — Progress Notes (Signed)
Pre visit review using our clinic review tool, if applicable. No additional management support is needed unless otherwise documented below in the visit note. 

## 2013-01-21 ENCOUNTER — Encounter: Payer: Self-pay | Admitting: Family Medicine

## 2013-01-21 NOTE — Progress Notes (Signed)
Patient ID: Lauren Hall, female   DOB: 12/31/1970, 42 y.o.   MRN: [id number] Lauren Hall [id number] Mar 24, 1970 01/21/2013      Progress Note-Follow Up  Subjective  Chief Complaint  Chief Complaint  Patient presents with  . Follow-up    HPI  42 year old [place] American female who is in today for followup. She is doing fairly well. She has stopped Robaxin and tramadol and feels her pain is tolerable off the medications. She had difficulty coming off but now is happy to be off of the medicines. They did cause excessive sedation. No other recent concerns. No recent illness. No chest pain, palpitations or shortness of breath. No GI or GU concerns.  Past Medical History  Diagnosis Date  . Anxiety   . Fibromyalgia 2009  . Fibromyalgia   . Anemia   . Allergy   . Depression   . Hypertension   . Fatigue 10/08/2010  . Headache(784.0) 10/08/2010  . Thyroid disease 10/08/2010  . Hip pain, bilateral 11/06/2010  . Poor concentration 01/11/2011  . Bronchitis 07/27/2011  . Tick bite of flank 11/09/2011  . Other and unspecified hyperlipidemia 07/29/2012  . Preventative health care 07/29/2012  . Peripheral neuropathy 11/11/2012  . Tinea corporis 11/11/2012    Past Surgical History  Procedure Laterality Date  . Breast lumpectomy  1999    right- benign    Family History  Problem Relation Age of Onset  . Fibromyalgia Mother   . Diabetes Mother     type 2  . Hypertension Mother   . Hyperlipidemia Mother   . Alcohol abuse Father   . Cancer Father     colon  . Hypertension Brother   . Heart disease Maternal Grandmother   . Hyperlipidemia Maternal Grandmother   . Diabetes Maternal Grandmother     type 2  . Cancer Maternal Grandmother     colon  . Stroke Maternal Grandfather   . Diabetes Maternal Grandfather     type 2  . Cancer Paternal Grandmother     ovarian  . Alzheimer's disease Paternal Grandfather   . Alcohol abuse Brother     History   Social History  . Marital  Status: Married    Spouse Name: N/A    Number of Children: N/A  . Years of Education: N/A   Occupational History  . Not on file.   Social History Main Topics  . Smoking status: Never Smoker   . Smokeless tobacco: Never Used  . Alcohol Use: Yes     Comment: special occassion- very rarely  . Drug Use: No  . Sexual Activity: Yes    Partners: Male   Other Topics Concern  . Not on file   Social History Narrative  . No narrative on file    Current Outpatient Prescriptions on File Prior to Visit  Medication Sig Dispense Refill  . buPROPion (WELLBUTRIN) 100 MG tablet Take 1 tablet (100 mg total) by mouth 2 (two) times daily.  180 tablet  1  . clindamycin-benzoyl peroxide (BENZACLIN) gel Apply topically 2 (two) times daily.  50 g  2  . clotrimazole-betamethasone (LOTRISONE) cream Apply topically 2 (two) times daily.  45 g  1  . escitalopram (LEXAPRO) 10 MG tablet Take 1 tablet (10 mg total) by mouth daily.  90 tablet  1  . fish oil-omega-3 fatty acids 1000 MG capsule MegaRed 1 cap by mouth daily (by Schiff)  30 capsule  1  . lisinopril (PRINIVIL,ZESTRIL) 10 MG tablet Take 1  tablet (10 mg total) by mouth 2 (two) times daily.  180 tablet  0  . nebivolol (BYSTOLIC) 5 MG tablet Take 1 tablet (5 mg total) by mouth daily.  90 tablet  1  . Tretinoin-Cleanser-Moisturizer 0.025 % CREAM KIT Apply topically at bedtime.        . Wheat Dextrin (BENEFIBER) POWD 2 tsp po daily to bid prn constipation, in 6-8 oz of fluid  1 Can  3  . EPINEPHrine (EPI-PEN) 0.3 mg/0.3 mL DEVI Inject 0.3 mg into the muscle once.       No current facility-administered medications on file prior to visit.    Allergies  Allergen Reactions  . Promethazine Anaphylaxis    Review of Systems  Review of Systems  Constitutional: Negative for fever and malaise/fatigue.  HENT: Negative for congestion.   Eyes: Negative for discharge.  Respiratory: Negative for shortness of breath.   Cardiovascular: Negative for chest pain,  palpitations and leg swelling.  Gastrointestinal: Negative for nausea, abdominal pain and diarrhea.  Genitourinary: Negative for dysuria.  Musculoskeletal: Positive for back pain, joint pain and myalgias. Negative for falls.  Skin: Negative for rash.  Neurological: Negative for loss of consciousness and headaches.  Endo/Heme/Allergies: Negative for polydipsia.  Psychiatric/Behavioral: Negative for depression and suicidal ideas. The patient is nervous/anxious. The patient does not have insomnia.     Objective  BP 130/90  Pulse 99  Temp(Src) 98.2 F (36.8 C) (Oral)  Ht 4' 11.25" (1.505 m)  Wt 147 lb 1.9 oz (66.733 kg)  BMI 29.46 kg/m2  SpO2 99%  LMP 12/17/2012  Physical Exam  Physical Exam  Constitutional: She is oriented to person, place, and time and well-developed, well-nourished, and in no distress. No distress.  HENT:  Head: Normocephalic and atraumatic.  Eyes: Conjunctivae are normal.  Neck: Neck supple. No thyromegaly present.  Cardiovascular: Normal rate, regular rhythm and normal heart sounds.   No murmur heard. Pulmonary/Chest: Effort normal and breath sounds normal. She has no wheezes.  Abdominal: She exhibits no distension and no mass.  Musculoskeletal: She exhibits no edema.  Lymphadenopathy:    She has no cervical adenopathy.  Neurological: She is alert and oriented to person, place, and time.  Skin: Skin is warm and dry. No rash noted. She is not diaphoretic.  Psychiatric: Memory, affect and judgment normal.    Lab Results  Component Value Date   TSH 0.920 07/26/2012   Lab Results  Component Value Date   WBC 4.2 07/26/2012   HGB 12.0 07/26/2012   HCT 36.3 07/26/2012   MCV 87.9 07/26/2012   PLT 319 07/26/2012   Lab Results  Component Value Date   CREATININE 0.80 07/26/2012   BUN 5* 07/26/2012   NA 138 07/26/2012   K 4.4 07/26/2012   CL 102 07/26/2012   CO2 30 07/26/2012   Lab Results  Component Value Date   ALT 15 07/26/2012   AST 14 07/26/2012    ALKPHOS 84 07/26/2012   BILITOT 0.3 07/26/2012   Lab Results  Component Value Date   CHOL 202* 07/26/2012   Lab Results  Component Value Date   HDL 72 07/26/2012   Lab Results  Component Value Date   LDLCALC 110* 07/26/2012   Lab Results  Component Value Date   TRIG 100 07/26/2012   Lab Results  Component Value Date   CHOLHDL 2.8 07/26/2012     Assessment & Plan  Hypertension Well controlled, no changes  Hip pain, bilateral Was using Tramadol has come  off and does not want to restart is tolerating her pain, encouraged to stay active and report worsening symptoms  Fibromyalgia Robaxin caused excessive fatigue. Does believe her current meds help her manage  Tachycardia Well controlled, no changes

## 2013-01-21 NOTE — Assessment & Plan Note (Signed)
Robaxin caused excessive fatigue. Does believe her current meds help her manage

## 2013-01-21 NOTE — Assessment & Plan Note (Signed)
Well controlled, no changes 

## 2013-02-20 ENCOUNTER — Encounter: Payer: Self-pay | Admitting: Family

## 2013-02-20 ENCOUNTER — Ambulatory Visit (INDEPENDENT_AMBULATORY_CARE_PROVIDER_SITE_OTHER): Payer: BC Managed Care – PPO | Admitting: Family

## 2013-02-20 VITALS — BP 120/82 | HR 94 | Temp 98.4°F | Resp 16 | Ht 59.25 in | Wt 145.0 lb

## 2013-02-20 DIAGNOSIS — A084 Viral intestinal infection, unspecified: Secondary | ICD-10-CM

## 2013-02-20 DIAGNOSIS — A088 Other specified intestinal infections: Secondary | ICD-10-CM

## 2013-02-20 DIAGNOSIS — K219 Gastro-esophageal reflux disease without esophagitis: Secondary | ICD-10-CM

## 2013-02-20 DIAGNOSIS — N926 Irregular menstruation, unspecified: Secondary | ICD-10-CM

## 2013-02-20 MED ORDER — OMEPRAZOLE 40 MG PO CPDR: 40.0000 mg | DELAYED_RELEASE_CAPSULE | Freq: Every day | ORAL | Status: DC

## 2013-02-20 NOTE — Progress Notes (Signed)
Subjective:     Patient ID: Lauren Hall, female   DOB: August 20, 1970, 43 y.o.   MRN: [id number]   CC: 1.  Abdominal pain, bloating, cramping x 1 week 2. Cough x 1 week, resolving 3. Irregular Menstrual cycle x 2 months  HPI 1. Pt reports stomach pain x 8 days. Cramping, bloating, firey burning sensations, lasting all day long. Less bothersome over this past weekend. Feels better today than previous days. Feels sour, knotted burning feeling. Diarrhea x 2-3 days. No nausea, no vomiting. No blood in urine or stool. No one at home is sick. Didn't notice any fever, chills. No nocturia or urgency at night. Tried Gas-ex. Has not taken any medications for this. Does not notice anything to improve pain. Stomach pain located above umbilicus. Pt reports more flatulence than usual. Pt doesn't notice that Zantac is helping in the last month. Pt switched from Tramadol last month to daily dose ibuprofen.   2. Cough started 10 days ago, dry, with tickling in throat. Nasal congestion and head congestion, has greatly improved; cough nearly resolved and is less concerned about this.  3. Skipped menstrual cycle December, spotted January 10-15. Heavy cycle 15-19, lighter flow today. No OCP use. Negative urine pregnancy test at home.    Review of Systems  Constitutional: Positive for malaise/fatigue. Negative for fever, chills and weight loss.  HENT: Negative for sore throat.   Respiratory: Positive for cough.   Gastrointestinal: Positive for abdominal pain and diarrhea. Negative for heartburn, nausea, vomiting, constipation, blood in stool and melena.  Genitourinary: Negative for urgency, frequency and hematuria.  Skin: Negative for rash.   Review of Systems  Constitutional: Positive for malaise/fatigue. Negative for fever, chills and weight loss.  HENT: Negative for sore throat.   Respiratory: Positive for cough.   Gastrointestinal: Positive for abdominal pain and diarrhea. Negative for heartburn, nausea,  vomiting, constipation, blood in stool and melena.  Genitourinary: Negative for urgency, frequency and hematuria.  Skin: Negative for rash.    Current outpatient prescriptions:buPROPion (WELLBUTRIN) 100 MG tablet, Take 1 tablet (100 mg total) by mouth 2 (two) times daily., Disp: 180 tablet, Rfl: 1;  clindamycin-benzoyl peroxide (BENZACLIN) gel, Apply topically 2 (two) times daily., Disp: 50 g, Rfl: 2;  clotrimazole-betamethasone (LOTRISONE) cream, Apply topically 2 (two) times daily., Disp: 45 g, Rfl: 1 EPINEPHrine (EPI-PEN) 0.3 mg/0.3 mL DEVI, Inject 0.3 mg into the muscle once., Disp: , Rfl: ;  fish oil-omega-3 fatty acids 1000 MG capsule, MegaRed 1 cap by mouth daily (by Schiff), Disp: 30 capsule, Rfl: 1;  gabapentin (NEURONTIN) 300 MG capsule, 1 tab po twice a day, Disp: 360 capsule, Rfl: 1;  lisinopril (PRINIVIL,ZESTRIL) 10 MG tablet, Take 1 tablet (10 mg total) by mouth 2 (two) times daily., Disp: 180 tablet, Rfl: 0 nebivolol (BYSTOLIC) 5 MG tablet, Take 1 tablet (5 mg total) by mouth daily., Disp: 90 tablet, Rfl: 1;  Tretinoin-Cleanser-Moisturizer 0.025 % CREAM KIT, Apply topically at bedtime.  , Disp: , Rfl: ;  Wheat Dextrin (BENEFIBER) POWD, 2 tsp po daily to bid prn constipation, in 6-8 oz of fluid, Disp: 1 Can, Rfl: 3  Past Medical History  Diagnosis Date  . Anxiety   . Fibromyalgia 2009  . Fibromyalgia   . Anemia   . Allergy   . Depression   . Hypertension   . Fatigue 10/08/2010  . Headache(784.0) 10/08/2010  . Thyroid disease 10/08/2010  . Hip pain, bilateral 11/06/2010  . Poor concentration 01/11/2011  . Bronchitis 07/27/2011  .  Tick bite of flank 11/09/2011  . Other and unspecified hyperlipidemia 07/29/2012  . Preventative health care 07/29/2012  . Peripheral neuropathy 11/11/2012  . Tinea corporis 11/11/2012    History   Social History  . Marital Status: Married    Spouse Name: N/A    Number of Children: N/A  . Years of Education: N/A   Occupational History  . Not on  file.   Social History Main Topics  . Smoking status: Never Smoker   . Smokeless tobacco: Never Used  . Alcohol Use: Yes     Comment: special occassion- very rarely  . Drug Use: No  . Sexual Activity: Yes    Partners: Male   Other Topics Concern  . Not on file   Social History Narrative  . No narrative on file    Past Surgical History  Procedure Laterality Date  . Breast lumpectomy  1999    right- benign    Family History  Problem Relation Age of Onset  . Fibromyalgia Mother   . Diabetes Mother     type 2  . Hypertension Mother   . Hyperlipidemia Mother   . Alcohol abuse Father   . Cancer Father     colon  . Hypertension Brother   . Heart disease Maternal Grandmother   . Hyperlipidemia Maternal Grandmother   . Diabetes Maternal Grandmother     type 2  . Cancer Maternal Grandmother     colon  . Stroke Maternal Grandfather   . Diabetes Maternal Grandfather     type 2  . Cancer Paternal Grandmother     ovarian  . Alzheimer's disease Paternal Grandfather   . Alcohol abuse Brother     Allergies  Allergen Reactions  . Promethazine Anaphylaxis    Current Outpatient Prescriptions on File Prior to Visit  Medication Sig Dispense Refill  . buPROPion (WELLBUTRIN) 100 MG tablet Take 1 tablet (100 mg total) by mouth 2 (two) times daily.  180 tablet  1  . clindamycin-benzoyl peroxide (BENZACLIN) gel Apply topically 2 (two) times daily.  50 g  2  . clotrimazole-betamethasone (LOTRISONE) cream Apply topically 2 (two) times daily.  45 g  1  . EPINEPHrine (EPI-PEN) 0.3 mg/0.3 mL DEVI Inject 0.3 mg into the muscle once.      . fish oil-omega-3 fatty acids 1000 MG capsule MegaRed 1 cap by mouth daily (by Schiff)  30 capsule  1  . gabapentin (NEURONTIN) 300 MG capsule 1 tab po twice a day  360 capsule  1  . lisinopril (PRINIVIL,ZESTRIL) 10 MG tablet Take 1 tablet (10 mg total) by mouth 2 (two) times daily.  180 tablet  0  . nebivolol (BYSTOLIC) 5 MG tablet Take 1 tablet (5  mg total) by mouth daily.  90 tablet  1  . Tretinoin-Cleanser-Moisturizer 0.025 % CREAM KIT Apply topically at bedtime.        . Wheat Dextrin (BENEFIBER) POWD 2 tsp po daily to bid prn constipation, in 6-8 oz of fluid  1 Can  3   No current facility-administered medications on file prior to visit.    BP 120/82  Pulse 94  Temp(Src) 98.4 F (36.9 C) (Oral)  Resp 16  Ht 4' 11.25" (1.505 m)  Wt 145 lb 0.6 oz (65.79 kg)  BMI 29.05 kg/m2  SpO2 99%  LMP 02/15/2013       Objective:   Physical Exam  Constitutional: She is oriented to person, place, and time. Vital signs are normal. She appears  well-developed and well-nourished. No distress.  HENT:  Head: Normocephalic and atraumatic.  Eyes: Pupils are equal, round, and reactive to light. Right eye exhibits no discharge. Left eye exhibits no discharge.  Neck: No tracheal deviation present. No thyromegaly present.  Cardiovascular: Normal rate, regular rhythm and normal heart sounds.   Pulmonary/Chest: Effort normal and breath sounds normal. No respiratory distress.  Abdominal: Soft. Normal appearance and bowel sounds are normal. She exhibits no mass. There is no tenderness. There is no rebound and no guarding.  Neurological: She is alert and oriented to person, place, and time.  Skin: Skin is warm and dry. No rash noted. No erythema.  Psychiatric: She has a normal mood and affect. Her behavior is normal. Judgment and thought content normal.   Filed Vitals:   02/20/13 0841  BP: 120/82  Pulse: 94  Temp: 98.4 F (36.9 C)  Resp: 16    Physical Exam  Constitutional: She is oriented to person, place, and time. Vital signs are normal. She appears well-developed and well-nourished. No distress.  HENT:  Head: Normocephalic and atraumatic.  Eyes: Pupils are equal, round, and reactive to light. Right eye exhibits no discharge. Left eye exhibits no discharge.  Neck: No tracheal deviation present. No thyromegaly present.  Cardiovascular:  Normal rate, regular rhythm and normal heart sounds.   Pulmonary/Chest: Effort normal and breath sounds normal. No respiratory distress.  Abdominal: Soft. Normal appearance and bowel sounds are normal. She exhibits no mass. There is no tenderness. There is no rebound and no guarding.  Neurological: She is alert and oriented to person, place, and time.  Skin: Skin is warm and dry. No rash noted. No erythema.  Psychiatric: She has a normal mood and affect. Her behavior is normal. Judgment and thought content normal.       Assessment:    1. Viral Gastroenteritis, resolving 2. Cough, resolved. 3. 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 Irregular menstrual cycle   Ddx: Cholecystitis, Duodenal ulcer, Peptic Ulcer Disease    Plan:      1. Rest, fluids. 2. Call office if cough returns. 3. Discontinue ibuprofen. Start Tylenol instead. Start omeprazole/discontinue zantac. Consider further imaging if symptoms worsen or if symptoms do not improve.  4.  Menstrual cycle changes ?associated perimenopausal symptoms.  If symptoms persist consider some additional testing (TSH, hormonal testing etc. To further evaluate) Monitor cycle for next 2 months.  Pt educated and understands that symptoms may persist one month but should start to feel better in about a week. Pt should call if stomach pain worsens.   02/20/2013  Martin[place] Danice Dippolito, PA-S  I have personally seen and examined patient and agree with Martinique Vonn Sliger PA-S's assessment and plan.

## 2013-02-20 NOTE — Patient Instructions (Signed)
Stop ibuprofen. Start omeprazole. Call if symptoms worsen, or if not improved in 1 week.

## 2013-02-20 NOTE — Progress Notes (Signed)
Pre visit review using our clinic review tool, if applicable. No additional management support is needed unless otherwise documented below in the visit note.,  

## 2013-03-06 ENCOUNTER — Other Ambulatory Visit: Payer: Self-pay | Admitting: Family Medicine

## 2013-03-07 NOTE — Telephone Encounter (Signed)
   Last RX was done on 01-16-13 quantity 360 with 1 refill..  RX request denied

## 2013-06-16 ENCOUNTER — Other Ambulatory Visit: Payer: Self-pay | Admitting: Family Medicine

## 2013-06-21 ENCOUNTER — Telehealth: Payer: Self-pay | Admitting: Family Medicine

## 2013-06-21 NOTE — Telephone Encounter (Signed)
Please advise on refill.

## 2013-06-21 NOTE — Telephone Encounter (Signed)
Patient left message requesting current 90 day supply of Lidoderm patches to be called to her pharmacy, patient states her insurance will cover these now that she has met her deductible: pt did not leave pharmacy name

## 2013-06-26 NOTE — Telephone Encounter (Signed)
OK to send in Lidoderm patches, apply 1 patch bid prn pain to affected area, 90 day supply with  1 rf just confirm what sites she is using it at

## 2013-06-26 NOTE — Telephone Encounter (Signed)
Left a message for patient to return my call. 

## 2013-06-27 NOTE — Telephone Encounter (Signed)
Pt requested return call from [place] so she "can let her know what it going on".

## 2013-06-28 NOTE — Telephone Encounter (Signed)
Left a message for patient to return my call. 

## 2013-07-09 ENCOUNTER — Encounter: Payer: BC Managed Care – PPO | Admitting: Family Medicine

## 2013-07-09 NOTE — Telephone Encounter (Signed)
Closing until patient returns call. Patient cancelled appt last week

## 2013-09-24 ENCOUNTER — Telehealth: Payer: Self-pay | Admitting: Family Medicine

## 2013-09-24 ENCOUNTER — Ambulatory Visit (INDEPENDENT_AMBULATORY_CARE_PROVIDER_SITE_OTHER): Payer: BC Managed Care – PPO | Admitting: Family Medicine

## 2013-09-24 ENCOUNTER — Encounter: Payer: Self-pay | Admitting: Family Medicine

## 2013-09-24 VITALS — BP 120/78 | HR 83 | Temp 98.9°F | Ht 59.25 in | Wt 139.0 lb

## 2013-09-24 DIAGNOSIS — R1013 Epigastric pain: Secondary | ICD-10-CM

## 2013-09-24 DIAGNOSIS — R000 Tachycardia, unspecified: Secondary | ICD-10-CM

## 2013-09-24 DIAGNOSIS — Z23 Encounter for immunization: Secondary | ICD-10-CM

## 2013-09-24 DIAGNOSIS — R5383 Other fatigue: Secondary | ICD-10-CM

## 2013-09-24 DIAGNOSIS — R5381 Other malaise: Secondary | ICD-10-CM

## 2013-09-24 DIAGNOSIS — D649 Anemia, unspecified: Secondary | ICD-10-CM

## 2013-09-24 DIAGNOSIS — R52 Pain, unspecified: Secondary | ICD-10-CM

## 2013-09-24 DIAGNOSIS — I10 Essential (primary) hypertension: Secondary | ICD-10-CM

## 2013-09-24 LAB — RENAL FUNCTION PANEL
ALBUMIN: 4.4 g/dL (ref 3.5–5.2)
BUN: 7 mg/dL (ref 6–23)
CALCIUM: 9.2 mg/dL (ref 8.4–10.5)
CO2: 27 mEq/L (ref 19–32)
CREATININE: 0.79 mg/dL (ref 0.50–1.10)
Chloride: 106 mEq/L (ref 96–112)
Glucose, Bld: 105 mg/dL — ABNORMAL HIGH (ref 70–99)
PHOSPHORUS: 3 mg/dL (ref 2.3–4.6)
Potassium: 4.6 mEq/L (ref 3.5–5.3)
SODIUM: 140 meq/L (ref 135–145)

## 2013-09-24 LAB — CBC
HCT: 27.3 % — ABNORMAL LOW (ref 36.0–46.0)
Hemoglobin: 8.9 g/dL — ABNORMAL LOW (ref 12.0–15.0)
MCH: 25 pg — AB (ref 26.0–34.0)
MCHC: 32.6 g/dL (ref 30.0–36.0)
MCV: 76.7 fL — AB (ref 78.0–100.0)
Platelets: 420 10*3/uL — ABNORMAL HIGH (ref 150–400)
RBC: 3.56 MIL/uL — AB (ref 3.87–5.11)
RDW: 16.3 % — AB (ref 11.5–15.5)
WBC: 6.5 10*3/uL (ref 4.0–10.5)

## 2013-09-24 LAB — HEPATIC FUNCTION PANEL
ALBUMIN: 4.4 g/dL (ref 3.5–5.2)
ALT: 11 U/L (ref 0–35)
AST: 16 U/L (ref 0–37)
Alkaline Phosphatase: 80 U/L (ref 39–117)
BILIRUBIN TOTAL: 0.2 mg/dL (ref 0.2–1.2)
Total Protein: 7 g/dL (ref 6.0–8.3)

## 2013-09-24 LAB — TSH: TSH: 0.665 u[IU]/mL (ref 0.350–4.500)

## 2013-09-24 LAB — LIPID PANEL
CHOL/HDL RATIO: 2.9 ratio
Cholesterol: 220 mg/dL — ABNORMAL HIGH (ref 0–200)
HDL: 75 mg/dL (ref >39)
LDL Cholesterol: 123 mg/dL — ABNORMAL HIGH (ref 0–99)
Triglycerides: 111 mg/dL (ref <150)
VLDL: 22 mg/dL (ref 0–40)

## 2013-09-24 MED ORDER — RANITIDINE HCL 300 MG PO TABS: 300.0000 mg | ORAL_TABLET | Freq: Every day | ORAL | Status: DC

## 2013-09-24 MED ORDER — ONDANSETRON HCL 4 MG PO TABS: 4.0000 mg | ORAL_TABLET | Freq: Three times a day (TID) | ORAL | Status: DC | PRN

## 2013-09-24 MED ORDER — NEBIVOLOL HCL 5 MG PO TABS: 5.0000 mg | ORAL_TABLET | Freq: Every day | ORAL | Status: DC

## 2013-09-24 MED ORDER — GABAPENTIN 300 MG PO CAPS: ORAL_CAPSULE | ORAL | Status: DC

## 2013-09-24 MED ORDER — OMEPRAZOLE 40 MG PO CPDR: 40.0000 mg | DELAYED_RELEASE_CAPSULE | Freq: Every day | ORAL | Status: DC

## 2013-09-24 NOTE — Patient Instructions (Addendum)
Start a probiotic such as Digestive Advantage or Phillip's colon health daily Minimize, acid, spice and fat  Anemia, Nonspecific Anemia is a condition in which the concentration of red blood cells or hemoglobin in the blood is below normal. Hemoglobin is a substance in red blood cells that carries oxygen to the tissues of the body. Anemia results in not enough oxygen reaching these tissues.  CAUSES  Common causes of anemia include:   Excessive bleeding. Bleeding may be internal or external. This includes excessive bleeding from periods (in women) or from the intestine.   Poor nutrition.   Chronic kidney, thyroid, and liver disease.  Bone marrow disorders that decrease red blood cell production.  Cancer and treatments for cancer.  HIV, AIDS, and their treatments.  Spleen problems that increase red blood cell destruction.  Blood disorders.  Excess destruction of red blood cells due to infection, medicines, and autoimmune disorders. SIGNS AND SYMPTOMS   Minor weakness.   Dizziness.   Headache.  Palpitations.   Shortness of breath, especially with exercise.   Paleness.  Cold sensitivity.  Indigestion.  Nausea.  Difficulty sleeping.  Difficulty concentrating. Symptoms may occur suddenly or they may develop slowly.  DIAGNOSIS  Additional blood tests are often needed. These help your health care provider determine the best treatment. Your health care provider will check your stool for blood and look for other causes of blood loss.  TREATMENT  Treatment varies depending on the cause of the anemia. Treatment can include:   Supplements of iron, vitamin L39, or folic acid.   Hormone medicines.   A blood transfusion. This may be needed if blood loss is severe.   Hospitalization. This may be needed if there is significant continual blood loss.   Dietary changes.  Spleen removal. HOME CARE INSTRUCTIONS Keep all follow-up appointments. It often takes many  weeks to correct anemia, and having your health care provider check on your condition and your response to treatment is very important. SEEK IMMEDIATE MEDICAL CARE IF:   You develop extreme weakness, shortness of breath, or chest pain.   You become dizzy or have trouble concentrating.  You develop heavy vaginal bleeding.   You develop a rash.   You have bloody or black, tarry stools.   You faint.   You vomit up blood.   You vomit repeatedly.   You have abdominal pain.  You have a fever or persistent symptoms for more than 2-3 days.   You have a fever and your symptoms suddenly get worse.   You are dehydrated.  MAKE SURE YOU:  Understand these instructions.  Will watch your condition.  Will get help right away if you are not doing well or get worse. Document Released: 02/26/2004 Document Revised: 09/20/2012 Document Reviewed: 07/14/2012 [facility] Patient Information 2015 [place], [place]. This information is not intended to replace advice given to you by your health care provider. Make sure you discuss any questions you have with your health care provider.

## 2013-09-24 NOTE — Telephone Encounter (Signed)
Relevant patient education assigned to patient using Emmi. ° °

## 2013-09-24 NOTE — Progress Notes (Signed)
Pre visit review using our clinic review tool, if applicable. No additional management support is needed unless otherwise documented below in the visit note. 

## 2013-09-25 ENCOUNTER — Other Ambulatory Visit: Payer: Self-pay | Admitting: Family Medicine

## 2013-09-25 ENCOUNTER — Telehealth: Payer: Self-pay

## 2013-09-25 DIAGNOSIS — D649 Anemia, unspecified: Secondary | ICD-10-CM

## 2013-09-25 DIAGNOSIS — R634 Abnormal weight loss: Secondary | ICD-10-CM

## 2013-09-25 DIAGNOSIS — R109 Unspecified abdominal pain: Secondary | ICD-10-CM

## 2013-09-25 LAB — H. PYLORI ANTIBODY, IGG: H Pylori IgG: 0.57 {ISR}

## 2013-09-25 MED ORDER — HEMOCYTE-PLUS 106-1 MG PO TABS: 1.0000 | ORAL_TABLET | Freq: Every day | ORAL | Status: DC

## 2013-09-25 NOTE — Telephone Encounter (Signed)
Lab order and ifob order placed

## 2013-09-26 ENCOUNTER — Other Ambulatory Visit: Payer: Self-pay | Admitting: Family Medicine

## 2013-09-26 ENCOUNTER — Encounter: Payer: Self-pay | Admitting: Family Medicine

## 2013-09-26 DIAGNOSIS — K219 Gastro-esophageal reflux disease without esophagitis: Secondary | ICD-10-CM

## 2013-09-26 DIAGNOSIS — R634 Abnormal weight loss: Secondary | ICD-10-CM

## 2013-09-26 DIAGNOSIS — D649 Anemia, unspecified: Secondary | ICD-10-CM

## 2013-09-26 DIAGNOSIS — R109 Unspecified abdominal pain: Secondary | ICD-10-CM

## 2013-09-26 HISTORY — DX: Gastro-esophageal reflux disease without esophagitis: K21.9

## 2013-09-26 NOTE — Assessment & Plan Note (Signed)
Worse with anemia. Start Hemocyte F daily

## 2013-09-26 NOTE — Assessment & Plan Note (Signed)
Describes PICA, heavy menstrual bleeding but also dark stool, abdominal pain, nausea, diarrhea, bloating and weight loss. She agrees to contact her OB/GYN for work up and we will proceed with abdominal ultrasound, start her on Hemocyte F and perform IFOB, repeat CBC next week or 2 and consider further work up when results available. H Pylori serum test negative today

## 2013-09-26 NOTE — Assessment & Plan Note (Signed)
With dyspepsia started on Omeprazole 40 mg daily which has given her some relief when she has taken in past

## 2013-09-26 NOTE — Assessment & Plan Note (Signed)
RRR today 

## 2013-09-26 NOTE — Progress Notes (Signed)
Patient ID: Lauren Hall, female   DOB: 10-06-1970, 43 y.o.   MRN: [id number] Lauren Hall [id number] 1970/08/23 09/26/2013      Progress Note-Follow Up  Subjective  Chief Complaint  Chief Complaint  Patient presents with  . Abdominal Pain  . Injections    flu    HPI  Patient is a 43 year old female in today for routine medical care. Donald discomfort and symptoms for several months now. She is describing some nausea but no vomiting this week as well as several episodes of loose stool. She is complaining of intermittent dyspepsia, gaseousness, bloating and heartburn off and on in the beginning of the year. No fevers or chills. She has taken 40 mg doses of Prevacid with some temporary relief. Continues to struggle with fatigue and malaise. Denies CP/palp/SOB/HA/congestion/fevers or GU c/o. Taking meds as prescribed  Past Medical History  Diagnosis Date  . Anxiety   . Fibromyalgia 2009  . Fibromyalgia   . Anemia   . Allergy   . Depression   . Hypertension   . Fatigue 10/08/2010  . Headache(784.0) 10/08/2010  . Thyroid disease 10/08/2010  . Hip pain, bilateral 11/06/2010  . Poor concentration 01/11/2011  . Bronchitis 07/27/2011  . Tick bite of flank 11/09/2011  . Other and unspecified hyperlipidemia 07/29/2012  . Preventative health care 07/29/2012  . Peripheral neuropathy 11/11/2012  . Tinea corporis 11/11/2012    Past Surgical History  Procedure Laterality Date  . Breast lumpectomy  1999    right- benign    Family History  Problem Relation Age of Onset  . Fibromyalgia Mother   . Diabetes Mother     type 2  . Hypertension Mother   . Hyperlipidemia Mother   . Alcohol abuse Father   . Cancer Father     colon  . Hypertension Brother   . Heart disease Maternal Grandmother   . Hyperlipidemia Maternal Grandmother   . Diabetes Maternal Grandmother     type 2  . Cancer Maternal Grandmother     colon  . Stroke Maternal Grandfather   . Diabetes Maternal Grandfather      type 2  . Cancer Paternal Grandmother     ovarian  . Alzheimer's disease Paternal Grandfather   . Alcohol abuse Brother     History   Social History  . Marital Status: Married    Spouse Name: N/A    Number of Children: N/A  . Years of Education: N/A   Occupational History  . Not on file.   Social History Main Topics  . Smoking status: Never Smoker   . Smokeless tobacco: Never Used  . Alcohol Use: Yes     Comment: special occassion- very rarely  . Drug Use: No  . Sexual Activity: Yes    Partners: Male   Other Topics Concern  . Not on file   Social History Narrative  . No narrative on file    Current Outpatient Prescriptions on File Prior to Visit  Medication Sig Dispense Refill  . buPROPion (WELLBUTRIN) 100 MG tablet Take 1 tablet (100 mg total) by mouth 2 (two) times daily.  180 tablet  1  . clindamycin-benzoyl peroxide (BENZACLIN) gel Apply topically 2 (two) times daily.  50 g  2  . clotrimazole-betamethasone (LOTRISONE) cream Apply topically 2 (two) times daily.  45 g  1  . EPINEPHrine (EPI-PEN) 0.3 mg/0.3 mL DEVI Inject 0.3 mg into the muscle once.      . fish oil-omega-3 fatty  acids 1000 MG capsule MegaRed 1 cap by mouth daily (by Schiff)  30 capsule  1  . lisinopril (PRINIVIL,ZESTRIL) 10 MG tablet Take 1 tablet (10 mg total) by mouth 2 (two) times daily.  180 tablet  0  . Tretinoin-Cleanser-Moisturizer 0.025 % CREAM KIT Apply topically at bedtime.        . Wheat Dextrin (BENEFIBER) POWD 2 tsp po daily to bid prn constipation, in 6-8 oz of fluid  1 Can  3   No current facility-administered medications on file prior to visit.    Allergies  Allergen Reactions  . Promethazine Anaphylaxis    Review of Systems  Review of Systems  Constitutional: Positive for weight loss and malaise/fatigue. Negative for fever.  HENT: Negative for congestion.   Eyes: Negative for discharge.  Respiratory: Negative for shortness of breath.   Cardiovascular: Negative for  chest pain, palpitations and leg swelling.  Gastrointestinal: Positive for heartburn, nausea, abdominal pain, diarrhea and melena. Negative for vomiting and blood in stool.  Genitourinary: Negative for dysuria.  Musculoskeletal: Negative for falls.  Skin: Negative for rash.  Neurological: Negative for loss of consciousness and headaches.  Endo/Heme/Allergies: Negative for polydipsia.  Psychiatric/Behavioral: Negative for depression and suicidal ideas. The patient is not nervous/anxious and does not have insomnia.     Objective  BP 120/78  Pulse 83  Temp(Src) 98.9 F (37.2 C) (Oral)  Ht 4' 11.25" (1.505 m)  Wt 139 lb (63.05 kg)  BMI 27.84 kg/m2  SpO2 99%  LMP 08/27/2013  Physical Exam  Physical Exam  Constitutional: She is oriented to person, place, and time and well-developed, well-nourished, and in no distress. No distress.  HENT:  Head: Normocephalic and atraumatic.  Eyes: Conjunctivae are normal.  Neck: Neck supple. No thyromegaly present.  Cardiovascular: Normal rate, regular rhythm and normal heart sounds.   No murmur heard. Pulmonary/Chest: Effort normal and breath sounds normal. She has no wheezes.  Abdominal: Soft. Bowel sounds are normal. She exhibits no distension and no mass. There is tenderness.  Tender with palp in epigastrium  Musculoskeletal: She exhibits no edema.  Lymphadenopathy:    She has no cervical adenopathy.  Neurological: She is alert and oriented to person, place, and time.  Skin: Skin is warm and dry. No rash noted. She is not diaphoretic.  Psychiatric: Memory, affect and judgment normal.    Lab Results  Component Value Date   TSH 0.665 09/24/2013   Lab Results  Component Value Date   WBC 6.5 09/24/2013   HGB 8.9* 09/24/2013   HCT 27.3* 09/24/2013   MCV 76.7* 09/24/2013   PLT 420* 09/24/2013   Lab Results  Component Value Date   CREATININE 0.79 09/24/2013   BUN 7 09/24/2013   NA 140 09/24/2013   K 4.6 09/24/2013   CL 106 09/24/2013   CO2  27 09/24/2013   Lab Results  Component Value Date   ALT 11 09/24/2013   AST 16 09/24/2013   ALKPHOS 80 09/24/2013   BILITOT 0.2 09/24/2013   Lab Results  Component Value Date   CHOL 220* 09/24/2013   Lab Results  Component Value Date   HDL 75 09/24/2013   Lab Results  Component Value Date   LDLCALC 123* 09/24/2013   Lab Results  Component Value Date   TRIG 111 09/24/2013   Lab Results  Component Value Date   CHOLHDL 2.9 09/24/2013     Assessment & Plan  Anemia Describes PICA, heavy menstrual bleeding but also dark stool, abdominal  pain, nausea, diarrhea, bloating and weight loss. She agrees to contact her OB/GYN for work up and we will proceed with abdominal ultrasound, start her on Hemocyte F and perform IFOB, repeat CBC next week or 2 and consider further work up when results available. H Pylori serum test negative today  Hypertension Well controlled, no changes to meds. Encouraged heart healthy diet such as the DASH diet and exercise as tolerated.   Fatigue Worse with anemia. Start Hemocyte F daily  Tachycardia RRR today  Abdominal pain, epigastric With dyspepsia started on Omeprazole 40 mg daily which has given her some relief when she has taken in past

## 2013-09-26 NOTE — Assessment & Plan Note (Signed)
Well controlled, no changes to meds. Encouraged heart healthy diet such as the DASH diet and exercise as tolerated.  °

## 2013-09-27 ENCOUNTER — Ambulatory Visit (HOSPITAL_BASED_OUTPATIENT_CLINIC_OR_DEPARTMENT_OTHER): Payer: BC Managed Care – PPO

## 2013-09-27 ENCOUNTER — Telehealth: Payer: Self-pay

## 2013-09-27 DIAGNOSIS — D649 Anemia, unspecified: Secondary | ICD-10-CM

## 2013-09-27 NOTE — Telephone Encounter (Signed)
I believe I gave her one also

## 2013-09-27 NOTE — Telephone Encounter (Signed)
Please call and make sure pt checked mychart message

## 2013-09-27 NOTE — Telephone Encounter (Signed)
Patient was called/ couldn't leave a message and mychart message sent along with IFOB order placed.  I believe you gave her an IFOB kit at appt

## 2013-09-27 NOTE — Telephone Encounter (Signed)
Message copied by Varney Daily on Thu Sep 27, 2013 10:37 AM ------      Message from: Penni Homans A      Created: Wed Sep 26, 2013 11:31 AM       Just want to make sure You are aware and patient is aware she needs to do IFOB and she needs to call her GYN and start Hemocyte F daily ------

## 2013-10-01 NOTE — Telephone Encounter (Signed)
Pt informed and cbc ordered

## 2013-10-03 ENCOUNTER — Other Ambulatory Visit (INDEPENDENT_AMBULATORY_CARE_PROVIDER_SITE_OTHER): Payer: BC Managed Care – PPO

## 2013-10-03 DIAGNOSIS — I10 Essential (primary) hypertension: Secondary | ICD-10-CM

## 2013-10-03 DIAGNOSIS — Z0000 Encounter for general adult medical examination without abnormal findings: Secondary | ICD-10-CM

## 2013-10-03 DIAGNOSIS — E079 Disorder of thyroid, unspecified: Secondary | ICD-10-CM

## 2013-10-04 LAB — HEPATIC FUNCTION PANEL
ALT: 12 U/L (ref 0–35)
AST: 16 U/L (ref 0–37)
Albumin: 3.9 g/dL (ref 3.5–5.2)
Alkaline Phosphatase: 76 U/L (ref 39–117)
BILIRUBIN DIRECT: 0 mg/dL (ref 0.0–0.3)
TOTAL PROTEIN: 7.3 g/dL (ref 6.0–8.3)
Total Bilirubin: 0.3 mg/dL (ref 0.2–1.2)

## 2013-10-04 LAB — TSH: TSH: 0.46 u[IU]/mL (ref 0.35–4.50)

## 2013-10-04 LAB — CBC
HCT: 29.6 % — ABNORMAL LOW (ref 36.0–46.0)
Hemoglobin: 9.4 g/dL — ABNORMAL LOW (ref 12.0–15.0)
MCHC: 31.9 g/dL (ref 30.0–36.0)
MCV: 82.3 fl (ref 78.0–100.0)
Platelets: 288 10*3/uL (ref 150.0–400.0)
RBC: 3.6 Mil/uL — ABNORMAL LOW (ref 3.87–5.11)
RDW: 16.6 % — AB (ref 11.5–15.5)
WBC: 6.8 10*3/uL (ref 4.0–10.5)

## 2013-11-12 ENCOUNTER — Encounter: Payer: Self-pay | Admitting: Family Medicine

## 2013-11-12 ENCOUNTER — Ambulatory Visit (INDEPENDENT_AMBULATORY_CARE_PROVIDER_SITE_OTHER): Payer: BC Managed Care – PPO | Admitting: Family Medicine

## 2013-11-12 VITALS — BP 121/82 | HR 81 | Temp 98.5°F | Ht 59.25 in | Wt 140.8 lb

## 2013-11-12 DIAGNOSIS — R52 Pain, unspecified: Secondary | ICD-10-CM

## 2013-11-12 DIAGNOSIS — R000 Tachycardia, unspecified: Secondary | ICD-10-CM

## 2013-11-12 DIAGNOSIS — Z0000 Encounter for general adult medical examination without abnormal findings: Secondary | ICD-10-CM

## 2013-11-12 DIAGNOSIS — M25569 Pain in unspecified knee: Secondary | ICD-10-CM

## 2013-11-12 DIAGNOSIS — K219 Gastro-esophageal reflux disease without esophagitis: Secondary | ICD-10-CM

## 2013-11-12 DIAGNOSIS — I10 Essential (primary) hypertension: Secondary | ICD-10-CM

## 2013-11-12 DIAGNOSIS — E785 Hyperlipidemia, unspecified: Secondary | ICD-10-CM

## 2013-11-12 DIAGNOSIS — D649 Anemia, unspecified: Secondary | ICD-10-CM

## 2013-11-12 LAB — RETICULOCYTES
ABS Retic: 87.4 10*3/uL (ref 19.0–186.0)
RBC.: 4.16 MIL/uL (ref 3.87–5.11)
RETIC CT PCT: 2.1 % (ref 0.4–2.3)

## 2013-11-12 MED ORDER — METHOCARBAMOL 500 MG PO TABS: 500.0000 mg | ORAL_TABLET | Freq: Three times a day (TID) | ORAL | Status: DC | PRN

## 2013-11-12 MED ORDER — GABAPENTIN 300 MG PO CAPS: ORAL_CAPSULE | ORAL | Status: DC

## 2013-11-12 NOTE — Patient Instructions (Addendum)
Anemia, Nonspecific Anemia is a condition in which the concentration of red blood cells or hemoglobin in the blood is below normal. Hemoglobin is a substance in red blood cells that carries oxygen to the tissues of the body. Anemia results in not enough oxygen reaching these tissues.  CAUSES  Common causes of anemia include:   Excessive bleeding. Bleeding may be internal or external. This includes excessive bleeding from periods (in women) or from the intestine.   Poor nutrition.   Chronic kidney, thyroid, and liver disease.  Bone marrow disorders that decrease red blood cell production.  Cancer and treatments for cancer.  HIV, AIDS, and their treatments.  Spleen problems that increase red blood cell destruction.  Blood disorders.  Excess destruction of red blood cells due to infection, medicines, and autoimmune disorders. SIGNS AND SYMPTOMS   Minor weakness.   Dizziness.   Headache.  Palpitations.   Shortness of breath, especially with exercise.   Paleness.  Cold sensitivity.  Indigestion.  Nausea.  Difficulty sleeping.  Difficulty concentrating. Symptoms may occur suddenly or they may develop slowly.  DIAGNOSIS  Additional blood tests are often needed. These help your health care provider determine the best treatment. Your health care provider will check your stool for blood and look for other causes of blood loss.  TREATMENT  Treatment varies depending on the cause of the anemia. Treatment can include:   Supplements of iron, vitamin O37, or folic acid.   Hormone medicines.   A blood transfusion. This may be needed if blood loss is severe.   Hospitalization. This may be needed if there is significant continual blood loss.   Dietary changes.  Spleen removal. HOME CARE INSTRUCTIONS Keep all follow-up appointments. It often takes many weeks to correct anemia, and having your health care provider check on your condition and your response to  treatment is very important. SEEK IMMEDIATE MEDICAL CARE IF:   You develop extreme weakness, shortness of breath, or chest pain.   You become dizzy or have trouble concentrating.  You develop heavy vaginal bleeding.   You develop a rash.   You have bloody or black, tarry stools.   You faint.   You vomit up blood.   You vomit repeatedly.   You have abdominal pain.  You have a fever or persistent symptoms for more than 2-3 days.   You have a fever and your symptoms suddenly get worse.   You are dehydrated.  MAKE SURE YOU:  Understand these instructions.  Will watch your condition.  Will get help right away if you are not doing well or get worse. Document Released: 02/26/2004 Document Revised: 09/20/2012 Document Reviewed: 07/14/2012 [facility] Patient Information 2015 [place], [place]. This information is not intended to replace advice given to you by your health care provider. Make sure you discuss any questions you have with your health care p

## 2013-11-12 NOTE — Progress Notes (Signed)
Patient ID: Lauren Hall, female   DOB: 03/31/1970, 43 y.o.   MRN: [id number] ARDELL AARONSON [id number] April 05, 1970 11/12/2013      Progress Note-Follow Up  Subjective  Chief Complaint  Chief Complaint  Patient presents with  . Annual Exam    physical    HPI  Patient is a 43 year old female in today for routine medical care. Patient is in today for annual exam. Continues to struggle with fatigue and poor concentration but is managing. Continues to struggle with chronic pain including myalgias and arthralgias but is essentially she was recently started with podiatry.at her baseline except for some foot and ankle pain. she has been struggling with a tendon injury but is improving. No recent illness or GU concerns. Declines Tdap shot today. Denies CP/palp/SOB/HA/congestion/fevers/GI or GU c/o. Taking meds as prescribed  Past Medical History  Diagnosis Date  . Anxiety   . Fibromyalgia 2009  . Fibromyalgia   . Anemia   . Allergy   . Depression   . Hypertension   . Fatigue 10/08/2010  . Headache(784.0) 10/08/2010  . Thyroid disease 10/08/2010  . Hip pain, bilateral 11/06/2010  . Poor concentration 01/11/2011  . Bronchitis 07/27/2011  . Tick bite of flank 11/09/2011  . Other and unspecified hyperlipidemia 07/29/2012  . Preventative health care 07/29/2012  . Peripheral neuropathy 11/11/2012  . Tinea corporis 11/11/2012    Past Surgical History  Procedure Laterality Date  . Breast lumpectomy  1999    right- benign    Family History  Problem Relation Age of Onset  . Fibromyalgia Mother   . Diabetes Mother     type 2  . Hypertension Mother   . Hyperlipidemia Mother   . Alcohol abuse Father   . Cancer Father     colon  . Hypertension Brother   . Heart disease Maternal Grandmother   . Hyperlipidemia Maternal Grandmother   . Diabetes Maternal Grandmother     type 2  . Cancer Maternal Grandmother     colon  . Stroke Maternal Grandfather   . Diabetes Maternal Grandfather      type 2  . Cancer Paternal Grandmother     ovarian  . Alzheimer's disease Paternal Grandfather   . Alcohol abuse Brother     History   Social History  . Marital Status: Married    Spouse Name: N/A    Number of Children: N/A  . Years of Education: N/A   Occupational History  . Not on file.   Social History Main Topics  . Smoking status: Never Smoker   . Smokeless tobacco: Never Used  . Alcohol Use: Yes     Comment: special occassion- very rarely  . Drug Use: No  . Sexual Activity: Yes    Partners: Male   Other Topics Concern  . Not on file   Social History Narrative  . No narrative on file    Current Outpatient Prescriptions on File Prior to Visit  Medication Sig Dispense Refill  . buPROPion (WELLBUTRIN) 100 MG tablet Take 1 tablet (100 mg total) by mouth 2 (two) times daily.  180 tablet  1  . clindamycin-benzoyl peroxide (BENZACLIN) gel Apply topically 2 (two) times daily.  50 g  2  . clotrimazole-betamethasone (LOTRISONE) cream Apply topically 2 (two) times daily.  45 g  1  . EPINEPHrine (EPI-PEN) 0.3 mg/0.3 mL DEVI Inject 0.3 mg into the muscle once.      . Fe Fum-FA-B Cmp-C-Zn-Mg-Mn-Cu (HEMOCYTE-PLUS) 106-1 MG TABS  Take 1 tablet by mouth daily.  30 tablet  1  . fish oil-omega-3 fatty acids 1000 MG capsule MegaRed 1 cap by mouth daily (by Schiff)  30 capsule  1  . gabapentin (NEURONTIN) 300 MG capsule 1 tab po twice a day  360 capsule  1  . lisinopril (PRINIVIL,ZESTRIL) 10 MG tablet Take 1 tablet (10 mg total) by mouth 2 (two) times daily.  180 tablet  0  . nebivolol (BYSTOLIC) 5 MG tablet Take 1 tablet (5 mg total) by mouth daily.  90 tablet  1  . omeprazole (PRILOSEC) 40 MG capsule Take 1 capsule (40 mg total) by mouth daily.  90 capsule  1  . ondansetron (ZOFRAN) 4 MG tablet Take 1 tablet (4 mg total) by mouth every 8 (eight) hours as needed for nausea or vomiting.  20 tablet  1  . ranitidine (ZANTAC) 300 MG tablet Take 1 tablet (300 mg total) by mouth at  bedtime.  30 tablet  3  . Tretinoin-Cleanser-Moisturizer 0.025 % CREAM KIT Apply topically at bedtime.        . Wheat Dextrin (BENEFIBER) POWD 2 tsp po daily to bid prn constipation, in 6-8 oz of fluid  1 Can  3   No current facility-administered medications on file prior to visit.    Allergies  Allergen Reactions  . Promethazine Anaphylaxis    Review of Systems  Review of Systems  Constitutional: Negative for fever and malaise/fatigue.  HENT: Negative for congestion.   Eyes: Negative for discharge.  Respiratory: Negative for shortness of breath.   Cardiovascular: Negative for chest pain, palpitations and leg swelling.  Gastrointestinal: Negative for nausea, abdominal pain and diarrhea.  Genitourinary: Negative for dysuria.  Musculoskeletal: Negative for falls.  Skin: Negative for rash.  Neurological: Negative for loss of consciousness and headaches.  Endo/Heme/Allergies: Negative for polydipsia.  Psychiatric/Behavioral: Negative for depression and suicidal ideas. The patient is not nervous/anxious and does not have insomnia.     Objective  BP 121/82  Pulse 81  Temp(Src) 98.5 F (36.9 C) (Oral)  Ht 4' 11.25" (1.505 m)  Wt 140 lb 12.8 oz (63.866 kg)  BMI 28.20 kg/m2  SpO2 100%  LMP 11/05/2013  Physical Exam  Physical Exam  Constitutional: She is oriented to person, place, and time and well-developed, well-nourished, and in no distress. No distress.  HENT:  Head: Normocephalic and atraumatic.  Eyes: Conjunctivae are normal.  Neck: Neck supple. No thyromegaly present.  Cardiovascular: Normal rate, regular rhythm and normal heart sounds.   No murmur heard. Pulmonary/Chest: Effort normal and breath sounds normal. She has no wheezes.  Abdominal: She exhibits no distension and no mass.  Musculoskeletal: She exhibits no edema.  Lymphadenopathy:    She has no cervical adenopathy.  Neurological: She is alert and oriented to person, place, and time.  Skin: Skin is warm  and dry. No rash noted. She is not diaphoretic.  Psychiatric: Memory, affect and judgment normal.    Lab Results  Component Value Date   TSH 0.46 10/04/2013   Lab Results  Component Value Date   WBC 6.8 10/04/2013   HGB 9.4* 10/04/2013   HCT 29.6* 10/04/2013   MCV 82.3 10/04/2013   PLT 288.0 10/04/2013   Lab Results  Component Value Date   CREATININE 0.79 09/24/2013   BUN 7 09/24/2013   NA 140 09/24/2013   K 4.6 09/24/2013   CL 106 09/24/2013   CO2 27 09/24/2013   Lab Results  Component Value Date  ALT 12 10/04/2013   AST 16 10/04/2013   ALKPHOS 76 10/04/2013   BILITOT 0.3 10/04/2013   Lab Results  Component Value Date   CHOL 220* 09/24/2013   Lab Results  Component Value Date   HDL 75 09/24/2013   Lab Results  Component Value Date   LDLCALC 123* 09/24/2013   Lab Results  Component Value Date   TRIG 111 09/24/2013   Lab Results  Component Value Date   CHOLHDL 2.9 09/24/2013     Assessment & Plan  Hypertension Well controlled, no changes to meds. Encouraged heart healthy diet such as the DASH diet and exercise as tolerated.   GERD (gastroesophageal reflux disease) Avoid offending foods, start probiotics. Do not eat large meals in late evening and consider raising head of bed. Continue current meds  Pain in joint, lower leg B/l foot pain is following with podiatry with a tendon injury noted. Encouraged ongoing care and can try topical treatments such as Salon Pas gel  Hyperlipidemia Encouraged heart healthy diet, increase exercise, avoid trans fats, consider a krill oil cap daily  Tachycardia RRR today  Anemia Increase leafy greens, consider increased lean red meat and using cast iron cookware. Continue to monitor, report any concerns. Is improving.   Preventative health care Patient encouraged to maintain heart healthy diet, regular exercise, adequate sleep. Consider daily probiotics. Take medications as prescribed. Has an appt with her OB/GYN next week for pap, Dr Si Raider  at [facility].

## 2013-11-12 NOTE — Progress Notes (Signed)
Pre visit review using our clinic review tool, if applicable. No additional management support is needed unless otherwise documented below in the visit note. 

## 2013-11-13 LAB — RENAL FUNCTION PANEL
ALBUMIN: 3.7 g/dL (ref 3.5–5.2)
BUN: 7 mg/dL (ref 6–23)
CALCIUM: 9.3 mg/dL (ref 8.4–10.5)
CO2: 24 mEq/L (ref 19–32)
Chloride: 101 mEq/L (ref 96–112)
Creatinine, Ser: 0.8 mg/dL (ref 0.4–1.2)
GFR: 104.27 mL/min (ref >60.00)
GLUCOSE: 71 mg/dL (ref 70–99)
Phosphorus: 3.3 mg/dL (ref 2.3–4.6)
Potassium: 4.2 mEq/L (ref 3.5–5.1)
Sodium: 134 mEq/L — ABNORMAL LOW (ref 135–145)

## 2013-11-13 LAB — CBC
HCT: 36.4 % (ref 36.0–46.0)
HEMOGLOBIN: 11.6 g/dL — AB (ref 12.0–15.0)
MCHC: 31.9 g/dL (ref 30.0–36.0)
MCV: 88.5 fl (ref 78.0–100.0)
PLATELETS: 499 10*3/uL — AB (ref 150.0–400.0)
RBC: 4.11 Mil/uL (ref 3.87–5.11)
RDW: 20.8 % — ABNORMAL HIGH (ref 11.5–15.5)
WBC: 8.5 10*3/uL (ref 4.0–10.5)

## 2013-11-18 ENCOUNTER — Encounter: Payer: Self-pay | Admitting: Family Medicine

## 2013-11-18 DIAGNOSIS — M25569 Pain in unspecified knee: Secondary | ICD-10-CM | POA: Insufficient documentation

## 2013-11-18 NOTE — Assessment & Plan Note (Signed)
Avoid offending foods, start probiotics. Do not eat large meals in late evening and consider raising head of bed. Continue current meds 

## 2013-11-18 NOTE — Assessment & Plan Note (Signed)
Well controlled, no changes to meds. Encouraged heart healthy diet such as the DASH diet and exercise as tolerated.  °

## 2013-11-18 NOTE — Assessment & Plan Note (Signed)
Encouraged heart healthy diet, increase exercise, avoid trans fats, consider a krill oil cap daily 

## 2013-11-18 NOTE — Assessment & Plan Note (Signed)
B/l foot pain is following with podiatry with a tendon injury noted. Encouraged ongoing care and can try topical treatments such as Salon Pas gel

## 2013-11-18 NOTE — Assessment & Plan Note (Signed)
Patient encouraged to maintain heart healthy diet, regular exercise, adequate sleep. Consider daily probiotics. Take medications as prescribed. Has an appt with her OB/GYN next week for pap, Dr Si Raider at [facility].

## 2013-11-18 NOTE — Assessment & Plan Note (Signed)
Increase leafy greens, consider increased lean red meat and using cast iron cookware. Continue to monitor, report any concerns. Is improving.

## 2013-11-18 NOTE — Assessment & Plan Note (Signed)
RRR today 

## 2013-12-13 ENCOUNTER — Other Ambulatory Visit: Payer: Self-pay

## 2013-12-13 DIAGNOSIS — R1013 Epigastric pain: Secondary | ICD-10-CM

## 2013-12-13 MED ORDER — RANITIDINE HCL 300 MG PO TABS: 300.0000 mg | ORAL_TABLET | Freq: Every day | ORAL | Status: DC

## 2013-12-14 ENCOUNTER — Other Ambulatory Visit: Payer: Self-pay

## 2013-12-14 DIAGNOSIS — R1013 Epigastric pain: Secondary | ICD-10-CM

## 2013-12-14 MED ORDER — RANITIDINE HCL 300 MG PO TABS: 300.0000 mg | ORAL_TABLET | Freq: Every day | ORAL | Status: DC

## 2014-01-21 ENCOUNTER — Other Ambulatory Visit: Payer: Self-pay | Admitting: *Deleted

## 2014-01-21 NOTE — Telephone Encounter (Signed)
LMOM @ 10:58am @ [phone]) asking the pt to RTC regarding direction on Lisinipril refill.//AB/[name]

## 2014-01-29 ENCOUNTER — Other Ambulatory Visit: Payer: Self-pay

## 2014-01-29 MED ORDER — LISINOPRIL 10 MG PO TABS: 10.0000 mg | ORAL_TABLET | Freq: Two times a day (BID) | ORAL | Status: DC

## 2014-02-12 NOTE — Telephone Encounter (Signed)
Rx refilled.//AB/[name]

## 2014-06-24 ENCOUNTER — Ambulatory Visit (INDEPENDENT_AMBULATORY_CARE_PROVIDER_SITE_OTHER): Payer: 59 | Admitting: Family Medicine

## 2014-06-24 ENCOUNTER — Encounter: Payer: Self-pay | Admitting: Family Medicine

## 2014-06-24 VITALS — BP 132/84 | HR 96 | Temp 98.8°F | Ht 59.0 in | Wt 135.2 lb

## 2014-06-24 DIAGNOSIS — E785 Hyperlipidemia, unspecified: Secondary | ICD-10-CM | POA: Diagnosis not present

## 2014-06-24 DIAGNOSIS — K219 Gastro-esophageal reflux disease without esophagitis: Secondary | ICD-10-CM

## 2014-06-24 DIAGNOSIS — E039 Hypothyroidism, unspecified: Secondary | ICD-10-CM

## 2014-06-24 DIAGNOSIS — R000 Tachycardia, unspecified: Secondary | ICD-10-CM

## 2014-06-24 DIAGNOSIS — F32A Depression, unspecified: Secondary | ICD-10-CM

## 2014-06-24 DIAGNOSIS — R51 Headache: Secondary | ICD-10-CM

## 2014-06-24 DIAGNOSIS — I10 Essential (primary) hypertension: Secondary | ICD-10-CM | POA: Diagnosis not present

## 2014-06-24 DIAGNOSIS — F329 Major depressive disorder, single episode, unspecified: Secondary | ICD-10-CM

## 2014-06-24 DIAGNOSIS — R519 Headache, unspecified: Secondary | ICD-10-CM

## 2014-06-24 DIAGNOSIS — M797 Fibromyalgia: Secondary | ICD-10-CM

## 2014-06-24 DIAGNOSIS — J019 Acute sinusitis, unspecified: Secondary | ICD-10-CM

## 2014-06-24 MED ORDER — NEBIVOLOL HCL 5 MG PO TABS: 5.0000 mg | ORAL_TABLET | Freq: Every day | ORAL | Status: DC

## 2014-06-24 MED ORDER — VENLAFAXINE HCL ER 75 MG PO CP24: 75.0000 mg | ORAL_CAPSULE | Freq: Every day | ORAL | Status: DC

## 2014-06-24 MED ORDER — LISINOPRIL 10 MG PO TABS: 10.0000 mg | ORAL_TABLET | Freq: Two times a day (BID) | ORAL | Status: DC

## 2014-06-24 MED ORDER — PREGABALIN 50 MG PO CAPS: ORAL_CAPSULE | ORAL | Status: DC

## 2014-06-24 MED ORDER — CEFDINIR 300 MG PO CAPS: 300.0000 mg | ORAL_CAPSULE | Freq: Two times a day (BID) | ORAL | Status: DC

## 2014-06-24 MED ORDER — VENLAFAXINE HCL ER 37.5 MG PO CP24: 37.5000 mg | ORAL_CAPSULE | Freq: Every day | ORAL | Status: DC

## 2014-06-24 NOTE — Patient Instructions (Signed)
Probiotic such as [employer]ive [employer]age or [place], at [email or web address] 10 strain probiotics Mucinex twice daily No sudafed Zinc  Cap or coldeeze    Sinusitis Sinusitis is redness, soreness, and inflammation of the paranasal sinuses. Paranasal sinuses are air pockets within the bones of your face (beneath the eyes, the middle of the forehead, or above the eyes). In healthy paranasal sinuses, mucus is able to drain out, and air is able to circulate through them by way of your nose. However, when your paranasal sinuses are inflamed, mucus and air can become trapped. This can allow bacteria and other germs to grow and cause infection. Sinusitis can develop quickly and last only a short time (acute) or continue over a long period (chronic). Sinusitis that lasts for more than 12 weeks is considered chronic.  CAUSES  Causes of sinusitis include:  Allergies.  Structural abnormalities, such as displacement of the cartilage that separates your nostrils (deviated septum), which can decrease the air flow through your nose and sinuses and affect sinus drainage.  Functional abnormalities, such as when the small hairs (cilia) that line your sinuses and help remove mucus do not work properly or are not present. SIGNS AND SYMPTOMS  Symptoms of acute and chronic sinusitis are the same. The primary symptoms are pain and pressure around the affected sinuses. Other symptoms include:  Upper toothache.  Earache.  Headache.  Bad breath.  Decreased sense of smell and taste.  A cough, which worsens when you are lying flat.  Fatigue.  Fever.  Thick drainage from your nose, which often is green and may contain pus (purulent).  Swelling and warmth over the affected sinuses. DIAGNOSIS  Your health care provider will perform a physical exam. During the exam, your health care provider may:  Look in your nose for signs of abnormal growths in your nostrils (nasal polyps).  Tap  over the affected sinus to check for signs of infection.  View the inside of your sinuses (endoscopy) using an imaging device that has a light attached (endoscope). If your health care provider suspects that you have chronic sinusitis, one or more of the following tests may be recommended:  Allergy tests.  Nasal culture. A sample of mucus is taken from your nose, sent to a lab, and screened for bacteria.  Nasal cytology. A sample of mucus is taken from your nose and examined by your health care provider to determine if your sinusitis is related to an allergy. TREATMENT  Most cases of acute sinusitis are related to a viral infection and will resolve on their own within 10 days. Sometimes medicines are prescribed to help relieve symptoms (pain medicine, decongestants, nasal steroid sprays, or saline sprays).  However, for sinusitis related to a bacterial infection, your health care provider will prescribe antibiotic medicines. These are medicines that will help kill the bacteria causing the infection.  Rarely, sinusitis is caused by a fungal infection. In theses cases, your health care provider will prescribe antifungal medicine. For some cases of chronic sinusitis, surgery is needed. Generally, these are cases in which sinusitis recurs more than 3 times per year, despite other treatments. HOME CARE INSTRUCTIONS   Drink plenty of water. Water helps thin the mucus so your sinuses can drain more easily.  Use a humidifier.  Inhale steam 3 to 4 times a day (for example, sit in the bathroom with the shower running).  Apply a warm, moist washcloth to your face 3 to 4 times a day, or as directed  by your health care provider.  Use saline nasal sprays to help moisten and clean your sinuses.  Take medicines only as directed by your health care provider.  If you were prescribed either an antibiotic or antifungal medicine, finish it all even if you start to feel better. SEEK IMMEDIATE MEDICAL CARE  IF:  You have increasing pain or severe headaches.  You have nausea, vomiting, or drowsiness.  You have swelling around your face.  You have vision problems.  You have a stiff neck.  You have difficulty breathing. MAKE SURE YOU:   Understand these instructions.  Will watch your condition.  Will get help right away if you are not doing well or get worse. Document Released: 01/18/2005 Document Revised: 06/04/2013 Document Reviewed: 02/02/2011 [facility] Patient Information 2015 [place], [place]. This information is not intended to replace advice given to you by your health care provider. Make sure you discuss any questions you have with your health care provider.

## 2014-06-24 NOTE — Progress Notes (Signed)
Pre visit review using our clinic review tool, if applicable. No additional management support is needed unless otherwise documented below in the visit note. 

## 2014-06-25 ENCOUNTER — Other Ambulatory Visit (INDEPENDENT_AMBULATORY_CARE_PROVIDER_SITE_OTHER): Payer: 59

## 2014-06-25 DIAGNOSIS — R739 Hyperglycemia, unspecified: Secondary | ICD-10-CM | POA: Diagnosis not present

## 2014-06-25 LAB — COMPREHENSIVE METABOLIC PANEL
ALT: 14 U/L (ref 0–35)
AST: 16 U/L (ref 0–37)
Albumin: 4.3 g/dL (ref 3.5–5.2)
Alkaline Phosphatase: 77 U/L (ref 39–117)
BUN: 7 mg/dL (ref 6–23)
CO2: 31 mEq/L (ref 19–32)
CREATININE: 0.73 mg/dL (ref 0.40–1.20)
Calcium: 9.3 mg/dL (ref 8.4–10.5)
Chloride: 102 mEq/L (ref 96–112)
GFR: 110.58 mL/min (ref >60.00)
Glucose, Bld: 112 mg/dL — ABNORMAL HIGH (ref 70–99)
Potassium: 3.5 mEq/L (ref 3.5–5.1)
Sodium: 139 mEq/L (ref 135–145)
TOTAL PROTEIN: 7.2 g/dL (ref 6.0–8.3)
Total Bilirubin: 0.4 mg/dL (ref 0.2–1.2)

## 2014-06-25 LAB — CBC
HCT: 38.5 % (ref 36.0–46.0)
HEMOGLOBIN: 13.1 g/dL (ref 12.0–15.0)
MCHC: 34 g/dL (ref 30.0–36.0)
MCV: 91.3 fl (ref 78.0–100.0)
PLATELETS: 407 10*3/uL — AB (ref 150.0–400.0)
RBC: 4.22 Mil/uL (ref 3.87–5.11)
RDW: 14.2 % (ref 11.5–15.5)
WBC: 9.6 10*3/uL (ref 4.0–10.5)

## 2014-06-25 LAB — LIPID PANEL
CHOL/HDL RATIO: 3
CHOLESTEROL: 235 mg/dL — AB (ref 0–200)
HDL: 78.5 mg/dL (ref >39.00)
LDL Cholesterol: 126 mg/dL — ABNORMAL HIGH (ref 0–99)
NonHDL: 156.5
Triglycerides: 155 mg/dL — ABNORMAL HIGH (ref 0.0–149.0)
VLDL: 31 mg/dL (ref 0.0–40.0)

## 2014-06-25 LAB — TSH: TSH: 0.55 u[IU]/mL (ref 0.35–4.50)

## 2014-06-25 LAB — HEMOGLOBIN A1C: HEMOGLOBIN A1C: 5.4 % (ref 4.6–6.5)

## 2014-07-01 ENCOUNTER — Encounter: Payer: Self-pay | Admitting: Family Medicine

## 2014-07-01 NOTE — Assessment & Plan Note (Signed)
Avoid offending foods, start probiotics. Do not eat large meals in late evening and consider raising head of bed.  

## 2014-07-01 NOTE — Assessment & Plan Note (Signed)
Recent flare, encouraged increased hydration, 64 ounces of clear fluids daily. Minimize alcohol and caffeine. Eat small frequent meals with lean proteins and complex carbs. Avoid high and low blood sugars. Get adequate sleep, 7-8 hours a night. Needs exercise daily preferably in the morning.

## 2014-07-01 NOTE — Assessment & Plan Note (Signed)
Well controlled, no changes to meds. Encouraged heart healthy diet such as the DASH diet and exercise as tolerated.  °

## 2014-07-01 NOTE — Assessment & Plan Note (Signed)
stasrted on Venlafaxine ER 37.5 mg x 7 days then increase to 75 mg daily

## 2014-07-01 NOTE — Progress Notes (Signed)
Lauren Hall  [id number] September 10, 1970 07/01/2014      Progress Note-Follow Up  Subjective  Chief Complaint  Chief Complaint  Patient presents with  . Fatigue    head pressure  . Cough    HPI  Patient is a 44 y.o. female in today for routine medical care. Patient is in today for complaining of numerous things. She has recently quit her job and has increased anxiety and anhedonia. She reports it was a good move but she is stressed. No suicidal ideation. She is also noting a couple days of increased headache and facial pressure. She has increased congestion and rhinorrhea. Also notes fevers malaise and myalgias. Mild cough nonproductive. Does get some relief with over-the-counter Sudafed cough and cold preparation. Chronic pain is worse. Denies CP/palp/SOB/HA/congestion/fevers/GI or GU c/o. Taking meds as prescribed  Past Medical History  Diagnosis Date  . Anxiety   . Fibromyalgia 2009  . Fibromyalgia   . Anemia   . Allergy   . Depression   . Hypertension   . Fatigue 10/08/2010  . Headache(784.0) 10/08/2010  . Thyroid disease 10/08/2010  . Hip pain, bilateral 11/06/2010  . Poor concentration 01/11/2011  . Bronchitis 07/27/2011  . Tick bite of flank 11/09/2011  . Other and unspecified hyperlipidemia 07/29/2012  . Preventative health care 07/29/2012  . Peripheral neuropathy 11/11/2012  . Tinea corporis 11/11/2012  . GERD (gastroesophageal reflux disease) 09/26/2013  . Sinusitis, acute 07/27/2011    Past Surgical History  Procedure Laterality Date  . Breast lumpectomy  1999    right- benign    Family History  Problem Relation Age of Onset  . Fibromyalgia Mother   . Diabetes Mother     type 2  . Hypertension Mother   . Hyperlipidemia Mother   . Alcohol abuse Father   . Cancer Father     colon  . Hypertension Brother   . Alcohol abuse Brother   . Heart disease Maternal Grandmother   . Hyperlipidemia Maternal Grandmother   . Diabetes Maternal Grandmother     type 2  .  Cancer Maternal Grandmother     colon  . Stroke Maternal Grandfather   . Diabetes Maternal Grandfather     type 2  . Cancer Paternal Grandmother     ovarian  . Alzheimer's disease Paternal Grandfather     History   Social History  . Marital Status: Married    Spouse Name: N/A  . Number of Children: N/A  . Years of Education: N/A   Occupational History  . Not on file.   Social History Main Topics  . Smoking status: Never Smoker   . Smokeless tobacco: Never Used  . Alcohol Use: Yes     Comment: special occassion- very rarely  . Drug Use: No  . Sexual Activity:    Partners: Male   Other Topics Concern  . Not on file   Social History Narrative    Current Outpatient Prescriptions on File Prior to Visit  Medication Sig Dispense Refill  . fish oil-omega-3 fatty acids 1000 MG capsule MegaRed 1 cap by mouth daily (by Schiff) 30 capsule 1  . methocarbamol (ROBAXIN) 500 MG tablet Take 1 tablet (500 mg total) by mouth every 8 (eight) hours as needed for muscle spasms. 60 tablet 1  . omeprazole (PRILOSEC) 40 MG capsule Take 1 capsule (40 mg total) by mouth daily. 90 capsule 1  . EPINEPHrine (EPI-PEN) 0.3 mg/0.3 mL DEVI Inject 0.3 mg into the muscle once.    Marland Kitchen  ondansetron (ZOFRAN) 4 MG tablet Take 1 tablet (4 mg total) by mouth every 8 (eight) hours as needed for nausea or vomiting. (Patient not taking: Reported on 06/24/2014) 20 tablet 1  . ranitidine (ZANTAC) 300 MG tablet Take 1 tablet (300 mg total) by mouth at bedtime. (Patient not taking: Reported on 06/24/2014) 90 tablet 1   No current facility-administered medications on file prior to visit.    Allergies  Allergen Reactions  . Promethazine Anaphylaxis    Review of Systems  Review of Systems  Constitutional: Positive for fever and malaise/fatigue.  HENT: Positive for congestion and ear pain.   Eyes: Negative for discharge.  Respiratory: Negative for shortness of breath.   Cardiovascular: Negative for chest pain,  palpitations and leg swelling.  Gastrointestinal: Negative for nausea, abdominal pain and diarrhea.  Genitourinary: Negative for dysuria.  Musculoskeletal: Positive for myalgias. Negative for falls.  Skin: Negative for rash.  Neurological: Positive for headaches. Negative for loss of consciousness.  Endo/Heme/Allergies: Negative for polydipsia.  Psychiatric/Behavioral: Positive for depression. Negative for suicidal ideas. The patient is nervous/anxious. The patient does not have insomnia.     Objective  BP 132/84 mmHg  Pulse 96  Temp(Src) 98.8 F (37.1 C) (Oral)  Ht 4\' 11"  (1.499 m)  Wt 135 lb 4 oz (61.349 kg)  BMI 27.30 kg/m2  SpO2 98%  LMP 06/18/2014  Physical Exam  Physical Exam  Constitutional: She is oriented to person, place, and time and well-developed, well-nourished, and in no distress. No distress.  HENT:  Head: Normocephalic and atraumatic.  TMs mildly erythematous and dull  Eyes: Conjunctivae are normal.  Neck: Neck supple. No thyromegaly present.  Cardiovascular: Normal rate, regular rhythm and normal heart sounds.   No murmur heard. Pulmonary/Chest: Effort normal and breath sounds normal. She has no wheezes.  Abdominal: She exhibits no distension and no mass.  Musculoskeletal: She exhibits no edema.  Lymphadenopathy:    She has no cervical adenopathy.  Neurological: She is alert and oriented to person, place, and time.  Skin: Skin is warm and dry. No rash noted. She is not diaphoretic.  Psychiatric: Memory, affect and judgment normal.    Lab Results  Component Value Date   TSH 0.55 06/24/2014   Lab Results  Component Value Date   WBC 9.6 06/24/2014   HGB 13.1 06/24/2014   HCT 38.5 06/24/2014   MCV 91.3 06/24/2014   PLT 407.0* 06/24/2014   Lab Results  Component Value Date   CREATININE 0.73 06/24/2014   BUN 7 06/24/2014   NA 139 06/24/2014   K 3.5 06/24/2014   CL 102 06/24/2014   CO2 31 06/24/2014   Lab Results  Component Value Date   ALT  14 06/24/2014   AST 16 06/24/2014   ALKPHOS 77 06/24/2014   BILITOT 0.4 06/24/2014   Lab Results  Component Value Date   CHOL 235* 06/24/2014   Lab Results  Component Value Date   HDL 78.50 06/24/2014   Lab Results  Component Value Date   LDLCALC 126* 06/24/2014   Lab Results  Component Value Date   TRIG 155.0* 06/24/2014   Lab Results  Component Value Date   CHOLHDL 3 06/24/2014     Assessment & Plan  Hypertension Well controlled, no changes to meds. Encouraged heart healthy diet such as the DASH diet and exercise as tolerated.    GERD (gastroesophageal reflux disease) Avoid offending foods, start probiotics. Do not eat large meals in late evening and consider raising head of  bed.    Fibromyalgia Patient has used Lyrica in past with some results. Will restart at patient request due to increased pain.   Headache Recent flare, encouraged increased hydration, 64 ounces of clear fluids daily. Minimize alcohol and caffeine. Eat small frequent meals with lean proteins and complex carbs. Avoid high and low blood sugars. Get adequate sleep, 7-8 hours a night. Needs exercise daily preferably in the morning.   Tachycardia Improved on recheck, will monitor   Sinusitis, acute Encouraged increased rest and hydration, add probiotics, zinc such as Coldeze or Xicam. Treat fevers as needed, given course of antibiotics to use if symptoms worsen   Depression stasrted on Venlafaxine ER 37.5 mg x 7 days then increase to 75 mg daily

## 2014-07-01 NOTE — Assessment & Plan Note (Signed)
Encouraged increased rest and hydration, add probiotics, zinc such as Coldeze or Xicam. Treat fevers as needed, given course of antibiotics to use if symptoms worsen

## 2014-07-01 NOTE — Assessment & Plan Note (Signed)
Patient has used Lyrica in past with some results. Will restart at patient request due to increased pain.

## 2014-07-01 NOTE — Assessment & Plan Note (Addendum)
Improved on recheck, will monitor

## 2014-07-06 ENCOUNTER — Other Ambulatory Visit: Payer: Self-pay | Admitting: Family Medicine

## 2014-08-15 ENCOUNTER — Telehealth: Payer: Self-pay | Admitting: Family Medicine

## 2014-08-15 NOTE — Telephone Encounter (Signed)
Caller name: Anmol Relation to pt: Call back number: [phone] Pharmacy: [facility] on [address]  Reason for call:   Patient states that she has been having trouble falling/staying asleep and was previously advised that the medications could cause this and is wanting to know what she should do

## 2014-08-15 NOTE — Telephone Encounter (Signed)
So lots of options, she could drop the Venlafaxine to 37.5 daily and start some Melatonin 10 mg po qhs, we could try a sleep med but they can be hard to get off. She also needs appt in august when she left she was supposed to set up for 3 month f/u

## 2014-08-16 ENCOUNTER — Telehealth: Payer: Self-pay | Admitting: *Deleted

## 2014-08-16 NOTE — Telephone Encounter (Signed)
Informed the patient of PCP instructions regarding medications.  The patient states she does not think the effexor is working at all.  The PA just today has gotten initiated so she has not even started yet.  The patient was not quite agreeable to scheduling appt. Now (was on the phone quite a while discussing all with her).  Advise please as she does not know what to do.

## 2014-08-16 NOTE — Telephone Encounter (Signed)
Called left msg. To call back 

## 2014-08-16 NOTE — Telephone Encounter (Signed)
I can refer her for psychiatry and counseling if she would like to see a specialit. But I am a little confused. Is she taking the Effexor or no? What makes her say it is not working if she is taking it? I told her when she left she needed to come in in 3 months when you start a new med you have to monitor. I cannot continue meds without appt.

## 2014-08-16 NOTE — Telephone Encounter (Signed)
Prior authorization for Lyrica initiated. Awaiting determination. JG//[name]

## 2014-08-19 NOTE — Telephone Encounter (Signed)
Called the patient left message to call back 

## 2014-08-19 NOTE — Telephone Encounter (Signed)
PA reworked for [employer]. Awaiting determination. JG//[name]

## 2014-08-19 NOTE — Telephone Encounter (Signed)
QLE approved effective until 08/15/2016 through [employer].  File ID: [id number]. Approval letter sent for scanning. JG//[name]

## 2014-08-20 MED ORDER — VENLAFAXINE HCL ER 37.5 MG PO CP24: 37.5000 mg | ORAL_CAPSULE | Freq: Every day | ORAL | Status: DC

## 2014-08-20 MED ORDER — GABAPENTIN 300 MG PO CAPS: ORAL_CAPSULE | ORAL | Status: DC

## 2014-08-20 MED ORDER — LORAZEPAM 0.5 MG PO TABS: 0.5000 mg | ORAL_TABLET | Freq: Three times a day (TID) | ORAL | Status: DC

## 2014-08-20 MED ORDER — LIDOCAINE 5 % EX PTCH: 2.0000 | MEDICATED_PATCH | CUTANEOUS | Status: DC

## 2014-08-20 NOTE — Telephone Encounter (Signed)
Called the patient informed of all information regarding medications.  The patient agreed to all. Electronically sent in Gabapentin and effexor.  Faxed hardcopy of lorazepam and patches to the pharmacy.

## 2014-08-20 NOTE — Telephone Encounter (Signed)
Called the patient left message to call back 

## 2014-08-20 NOTE — Telephone Encounter (Signed)
Patient returned phone call. Best is [phone]

## 2014-08-20 NOTE — Telephone Encounter (Signed)
The patient is currently taking effexor, but not sleeping well and still anxious.  She is trying to schedule an appt in august with you to followup on this.  But needs to know what to do in the mean time waiting to be seen. 2. You prescribed Lyrica , she has not received as authorization not complete yet and she is  Needing a refill on gabapentin as is taking daily. 3. Want a prescription of Lidoderm patches too.

## 2014-08-20 NOTE — Telephone Encounter (Signed)
So I previously suggested she should drop back to the Effexor XR 37.5 mg cap and take one daily for 8 weeks, Disp #30 with 1 rf and then we will try going back up again. For insomnia if does not want to do Melatonin 10 mg then can have Lorazepam 0.5 tab 1 tab po qhs prn insomnia, anxiety, disp #30 with 1 rf to help her sleep temporarily. If we titrate up the dose more slowly she might tolerate. Can refill last rx for Gabapentin and check with Jess to see if we need to do anything with the Lyrica. OK to rx the Lidoderm patches but warn her they may need a PA also. Lidoderm patches apply up to 2 daily to affected areas, prn pain, on for 12 hours and off for 12 hours. Disp #60 with 1rf

## 2014-08-21 ENCOUNTER — Other Ambulatory Visit: Payer: Self-pay | Admitting: Family Medicine

## 2014-12-02 ENCOUNTER — Other Ambulatory Visit: Payer: Self-pay | Admitting: Family Medicine

## 2014-12-02 MED ORDER — VENLAFAXINE HCL ER 37.5 MG PO CP24: 37.5000 mg | ORAL_CAPSULE | Freq: Every day | ORAL | Status: DC

## 2015-01-13 ENCOUNTER — Other Ambulatory Visit: Payer: Self-pay | Admitting: Family Medicine

## 2015-01-13 MED ORDER — LISINOPRIL 10 MG PO TABS: 10.0000 mg | ORAL_TABLET | Freq: Two times a day (BID) | ORAL | Status: DC

## 2015-01-13 MED ORDER — OMEPRAZOLE 40 MG PO CPDR: DELAYED_RELEASE_CAPSULE | ORAL | Status: DC

## 2015-01-28 ENCOUNTER — Other Ambulatory Visit: Payer: Self-pay | Admitting: Family Medicine

## 2015-01-28 MED ORDER — LORAZEPAM 0.5 MG PO TABS: 0.5000 mg | ORAL_TABLET | Freq: Three times a day (TID) | ORAL | Status: DC

## 2015-01-28 NOTE — Telephone Encounter (Signed)
Faxed hardcopy for Lorazepam to [place] [phone]  Approved refill for #30 with 1 refill

## 2015-01-28 NOTE — Telephone Encounter (Signed)
Requesting: Lorazepam 0.5mg  tablets Contract  None UDS   None Last OV   06/24/2014 Last Refill   #30 with 1 refill on 08/20/2014  Please Advise

## 2015-02-25 ENCOUNTER — Other Ambulatory Visit: Payer: Self-pay | Admitting: Family Medicine

## 2015-02-25 MED ORDER — GABAPENTIN 300 MG PO CAPS: ORAL_CAPSULE | ORAL | Status: DC

## 2015-03-11 ENCOUNTER — Ambulatory Visit (INDEPENDENT_AMBULATORY_CARE_PROVIDER_SITE_OTHER): Payer: 59 | Admitting: Family Medicine

## 2015-03-11 ENCOUNTER — Encounter: Payer: Self-pay | Admitting: Family Medicine

## 2015-03-11 VITALS — BP 142/86 | HR 71 | Temp 98.6°F | Ht <= 58 in | Wt 142.1 lb

## 2015-03-11 DIAGNOSIS — Q0700 Arnold-Chiari syndrome without spina bifida or hydrocephalus: Secondary | ICD-10-CM

## 2015-03-11 DIAGNOSIS — F32A Depression, unspecified: Secondary | ICD-10-CM

## 2015-03-11 DIAGNOSIS — R1013 Epigastric pain: Secondary | ICD-10-CM

## 2015-03-11 DIAGNOSIS — R000 Tachycardia, unspecified: Secondary | ICD-10-CM | POA: Diagnosis not present

## 2015-03-11 DIAGNOSIS — M545 Low back pain, unspecified: Secondary | ICD-10-CM | POA: Insufficient documentation

## 2015-03-11 DIAGNOSIS — IMO0002 Reserved for concepts with insufficient information to code with codable children: Secondary | ICD-10-CM

## 2015-03-11 DIAGNOSIS — F329 Major depressive disorder, single episode, unspecified: Secondary | ICD-10-CM

## 2015-03-11 DIAGNOSIS — M25569 Pain in unspecified knee: Secondary | ICD-10-CM | POA: Diagnosis not present

## 2015-03-11 DIAGNOSIS — R51 Headache: Secondary | ICD-10-CM

## 2015-03-11 DIAGNOSIS — M79604 Pain in right leg: Secondary | ICD-10-CM

## 2015-03-11 DIAGNOSIS — I10 Essential (primary) hypertension: Secondary | ICD-10-CM

## 2015-03-11 DIAGNOSIS — M797 Fibromyalgia: Secondary | ICD-10-CM

## 2015-03-11 DIAGNOSIS — R519 Headache, unspecified: Secondary | ICD-10-CM

## 2015-03-11 DIAGNOSIS — E785 Hyperlipidemia, unspecified: Secondary | ICD-10-CM

## 2015-03-11 HISTORY — DX: Pain in right leg: M79.604

## 2015-03-11 HISTORY — DX: Low back pain, unspecified: M54.50

## 2015-03-11 MED ORDER — METHOCARBAMOL 500 MG PO TABS: 500.0000 mg | ORAL_TABLET | Freq: Two times a day (BID) | ORAL | Status: DC | PRN

## 2015-03-11 MED ORDER — CYCLOBENZAPRINE HCL 5 MG PO TABS: 5.0000 mg | ORAL_TABLET | Freq: Every evening | ORAL | Status: DC | PRN

## 2015-03-11 MED ORDER — NEBIVOLOL HCL 5 MG PO TABS: 5.0000 mg | ORAL_TABLET | Freq: Every day | ORAL | Status: DC

## 2015-03-11 MED ORDER — LORAZEPAM 0.5 MG PO TABS: 0.5000 mg | ORAL_TABLET | Freq: Three times a day (TID) | ORAL | Status: DC

## 2015-03-11 MED ORDER — GABAPENTIN 300 MG PO CAPS: ORAL_CAPSULE | ORAL | Status: DC

## 2015-03-11 MED ORDER — RANITIDINE HCL 300 MG PO TABS: 300.0000 mg | ORAL_TABLET | Freq: Every day | ORAL | Status: DC

## 2015-03-11 MED ORDER — ONDANSETRON HCL 4 MG PO TABS
4.0000 mg | ORAL_TABLET | Freq: Three times a day (TID) | ORAL | Status: DC | PRN
Start: 2015-03-11 — End: 2021-07-14

## 2015-03-11 NOTE — Progress Notes (Signed)
Pre visit review using our clinic review tool, if applicable. No additional management support is needed unless otherwise documented below in the visit note. 

## 2015-03-11 NOTE — Patient Instructions (Signed)
savella for fibromyalgia

## 2015-03-14 ENCOUNTER — Ambulatory Visit: Payer: 59 | Admitting: Family Medicine

## 2015-03-16 NOTE — Assessment & Plan Note (Signed)
Encouraged heart healthy diet, increase exercise, avoid trans fats, consider a krill oil cap daily 

## 2015-03-16 NOTE — Assessment & Plan Note (Signed)
RRR today 

## 2015-03-16 NOTE — Assessment & Plan Note (Addendum)
Struggling with anxiety and depression secondary to pain. She feels she does not want to change meds today

## 2015-03-16 NOTE — Assessment & Plan Note (Signed)
Struggling with constant pain, encouraged a clean diet, regular exercise and may continue muscle relaxers prn

## 2015-03-16 NOTE — Progress Notes (Signed)
Patient ID: Lauren Hall, female   DOB: 09-Aug-1970, 45 y.o.   MRN: [id number]   Subjective:    Patient ID: Lauren Hall, female    DOB: 12/09/1970, 45 y.o.   MRN: [id number]  Chief Complaint  Patient presents with  . Follow-up    HPI Patient is in today for follow-up on numerous concerns. She is not sure which dose of lisinopril she has been taking. She continues to struggle with chronic diffuse pain. She has myalgias as well as diffuse arthralgias. Back pain has sciatica associated and is keeping her from sleeping well. No recent febrile illness but fatigue is present daily. Denies CP/palp/SOB/HA/congestion/fevers/GI or GU c/o. Taking meds as prescribed  Past Medical History  Diagnosis Date  . Anxiety   . Fibromyalgia 2009  . Fibromyalgia   . Anemia   . Allergy   . Depression   . Hypertension   . Fatigue 10/08/2010  . Headache(784.0) 10/08/2010  . Thyroid disease 10/08/2010  . Hip pain, bilateral 11/06/2010  . Poor concentration 01/11/2011  . Bronchitis 07/27/2011  . Tick bite of flank 11/09/2011  . Other and unspecified hyperlipidemia 07/29/2012  . Preventative health care 07/29/2012  . Peripheral neuropathy ([place]) 11/11/2012  . Tinea corporis 11/11/2012  . GERD (gastroesophageal reflux disease) 09/26/2013  . Sinusitis, acute 07/27/2011  . Low back pain radiating to right leg 03/11/2015    Past Surgical History  Procedure Laterality Date  . Breast lumpectomy  1999    right- benign    Family History  Problem Relation Age of Onset  . Fibromyalgia Mother   . Diabetes Mother     type 2  . Hypertension Mother   . Hyperlipidemia Mother   . Alcohol abuse Father   . Cancer Father     colon  . Hypertension Brother   . Alcohol abuse Brother   . Heart disease Maternal Grandmother   . Hyperlipidemia Maternal Grandmother   . Diabetes Maternal Grandmother     type 2  . Cancer Maternal Grandmother     colon  . Stroke Maternal Grandfather   . Diabetes Maternal Grandfather     type 2  . Cancer Paternal Grandmother     ovarian  . Alzheimer's disease Paternal Grandfather     Social History   Social History  . Marital Status: Married    Spouse Name: N/A  . Number of Children: N/A  . Years of Education: N/A   Occupational History  . Not on file.   Social History Main Topics  . Smoking status: Never Smoker   . Smokeless tobacco: Never Used  . Alcohol Use: Yes     Comment: special occassion- very rarely  . Drug Use: No  . Sexual Activity:    Partners: Male   Other Topics Concern  . Not on file   Social History Narrative    Outpatient Prescriptions Prior to Visit  Medication Sig Dispense Refill  . fish oil-omega-3 fatty acids 1000 MG capsule MegaRed 1 cap by mouth daily (by Schiff) 30 capsule 1  . lidocaine (LIDODERM) 5 % Place 2 patches onto the skin daily. Remove & Discard patch within 12 hours or as directed by MD 60 patch 1  . omeprazole (PRILOSEC) 40 MG capsule TAKE 1 CAPSULE BY MOUTH EVERY DAY 90 capsule 0  . venlafaxine XR (EFFEXOR XR) 37.5 MG 24 hr capsule Take 1 capsule (37.5 mg total) by mouth daily with breakfast. 30 capsule 4  . gabapentin (NEURONTIN) 300 MG capsule  TAKE ONE CAPSULE BY MOUTH TWICE A DAY AND 2 CAPSULES AT BEDTIME 360 capsule 0  . LORazepam (ATIVAN) 0.5 MG tablet Take 1 tablet (0.5 mg total) by mouth every 8 (eight) hours. 30 tablet 1  . nebivolol (BYSTOLIC) 5 MG tablet Take 1 tablet (5 mg total) by mouth daily. 90 tablet 1  . ranitidine (ZANTAC) 300 MG tablet Take 1 tablet (300 mg total) by mouth at bedtime. 90 tablet 1  . EPINEPHrine (EPI-PEN) 0.3 mg/0.3 mL DEVI Inject 0.3 mg into the muscle once. Reported on 03/11/2015    . lisinopril (PRINIVIL,ZESTRIL) 10 MG tablet Take 1 tablet (10 mg total) by mouth 2 (two) times daily. 180 tablet 0  . pregabalin (LYRICA) 50 MG capsule ONE TAB THREE TIMES A DAY FOR 2 WEEKS THEN 2 TABS THREE TIMES A DAY (Patient not taking: Reported on 03/11/2015) 135 capsule 1  . cefdinir (OMNICEF) 300 MG  capsule Take 1 capsule (300 mg total) by mouth 2 (two) times daily. 28 capsule 0  . lisinopril (PRINIVIL,ZESTRIL) 20 MG tablet TAKE 1/2 TABLET BY MOUTH TWICE DAILY 90 tablet 0  . methocarbamol (ROBAXIN) 500 MG tablet Take 1 tablet (500 mg total) by mouth every 8 (eight) hours as needed for muscle spasms. (Patient not taking: Reported on 03/11/2015) 60 tablet 1  . ondansetron (ZOFRAN) 4 MG tablet Take 1 tablet (4 mg total) by mouth every 8 (eight) hours as needed for nausea or vomiting. (Patient not taking: Reported on 06/24/2014) 20 tablet 1   No facility-administered medications prior to visit.    Allergies  Allergen Reactions  . Promethazine Anaphylaxis    Review of Systems  Constitutional: Positive for malaise/fatigue. Negative for fever.  HENT: Negative for congestion.   Eyes: Negative for discharge.  Respiratory: Negative for shortness of breath.   Cardiovascular: Negative for chest pain, palpitations and leg swelling.  Gastrointestinal: Negative for nausea and abdominal pain.  Genitourinary: Negative for dysuria.  Musculoskeletal: Positive for myalgias, back pain, joint pain and neck pain. Negative for falls.  Skin: Negative for rash.  Neurological: Negative for loss of consciousness and headaches.  Endo/Heme/Allergies: Negative for environmental allergies.  Psychiatric/Behavioral: Positive for depression. The patient is nervous/anxious.        Objective:    Physical Exam  Constitutional: She is oriented to person, place, and time. She appears well-developed and well-nourished. No distress.  HENT:  Head: Normocephalic and atraumatic.  Nose: Nose normal.  Eyes: Right eye exhibits no discharge. Left eye exhibits no discharge.  Neck: Normal range of motion. Neck supple.  Cardiovascular: Normal rate and regular rhythm.   No murmur heard. Pulmonary/Chest: Effort normal and breath sounds normal.  Abdominal: Soft. Bowel sounds are normal. There is no tenderness.    Musculoskeletal: She exhibits no edema.  Neurological: She is alert and oriented to person, place, and time.  Skin: Skin is warm and dry.  Psychiatric: She has a normal mood and affect.  Nursing note and vitals reviewed.   BP 142/86 mmHg  Pulse 71  Temp(Src) 98.6 F (37 C) (Oral)  Ht 4\' 9"  (1.448 m)  Wt 142 lb 2 oz (64.467 kg)  BMI 30.75 kg/m2  SpO2 99%  LMP 01/22/2015 Wt Readings from Last 3 Encounters:  03/11/15 142 lb 2 oz (64.467 kg)  06/24/14 135 lb 4 oz (61.349 kg)  11/12/13 140 lb 12.8 oz (63.866 kg)     Lab Results  Component Value Date   WBC 9.6 06/24/2014   HGB 13.1 06/24/2014  HCT 38.5 06/24/2014   PLT 407.0* 06/24/2014   GLUCOSE 112* 06/24/2014   CHOL 235* 06/24/2014   TRIG 155.0* 06/24/2014   HDL 78.50 06/24/2014   LDLCALC 126* 06/24/2014   ALT 14 06/24/2014   AST 16 06/24/2014   NA 139 06/24/2014   K 3.5 06/24/2014   CL 102 06/24/2014   CREATININE 0.73 06/24/2014   BUN 7 06/24/2014   CO2 31 06/24/2014   TSH 0.55 06/24/2014   HGBA1C 5.4 06/25/2014    Lab Results  Component Value Date   TSH 0.55 06/24/2014   Lab Results  Component Value Date   WBC 9.6 06/24/2014   HGB 13.1 06/24/2014   HCT 38.5 06/24/2014   MCV 91.3 06/24/2014   PLT 407.0* 06/24/2014   Lab Results  Component Value Date   NA 139 06/24/2014   K 3.5 06/24/2014   CO2 31 06/24/2014   GLUCOSE 112* 06/24/2014   BUN 7 06/24/2014   CREATININE 0.73 06/24/2014   BILITOT 0.4 06/24/2014   ALKPHOS 77 06/24/2014   AST 16 06/24/2014   ALT 14 06/24/2014   PROT 7.2 06/24/2014   ALBUMIN 4.3 06/24/2014   CALCIUM 9.3 06/24/2014   GFR 110.58 06/24/2014   Lab Results  Component Value Date   CHOL 235* 06/24/2014   Lab Results  Component Value Date   HDL 78.50 06/24/2014   Lab Results  Component Value Date   LDLCALC 126* 06/24/2014   Lab Results  Component Value Date   TRIG 155.0* 06/24/2014   Lab Results  Component Value Date   CHOLHDL 3 06/24/2014   Lab Results   Component Value Date   HGBA1C 5.4 06/25/2014       Assessment & Plan:   Problem List Items Addressed This Visit    Depression    Struggling with anxiety and depression secondary to pain. She feels she does not want to change meds today      Relevant Medications   LORazepam (ATIVAN) 0.5 MG tablet   Fibromyalgia    Struggling with constant pain, encouraged a clean diet, regular exercise and may continue muscle relaxers prn      Headache   Relevant Medications   methocarbamol (ROBAXIN) 500 MG tablet   gabapentin (NEURONTIN) 300 MG capsule   nebivolol (BYSTOLIC) 5 MG tablet   cyclobenzaprine (FLEXERIL) 5 MG tablet   Other Relevant Orders   Ambulatory referral to Neurosurgery   Hyperlipidemia    Encouraged heart healthy diet, increase exercise, avoid trans fats, consider a krill oil cap daily      Relevant Medications   nebivolol (BYSTOLIC) 5 MG tablet   Hypertension    Improved on recheck. no changes to meds. Encouraged heart healthy diet such as the DASH diet and exercise as tolerated.       Relevant Medications   nebivolol (BYSTOLIC) 5 MG tablet   Low back pain radiating to right leg   Relevant Medications   methocarbamol (ROBAXIN) 500 MG tablet   cyclobenzaprine (FLEXERIL) 5 MG tablet   Tachycardia    RRR today      Relevant Medications   nebivolol (BYSTOLIC) 5 MG tablet    Other Visit Diagnoses    Pain in joint, lower leg, unspecified laterality    -  Primary    Relevant Medications    methocarbamol (ROBAXIN) 500 MG tablet    cyclobenzaprine (FLEXERIL) 5 MG tablet    Abdominal pain, epigastric        Relevant Medications  ranitidine (ZANTAC) 300 MG tablet    Chiari malformation        Relevant Orders    Ambulatory referral to Neurosurgery       I have discontinued Ms. Titus's cefdinir. I have also changed her methocarbamol. Additionally, I am having her start on cyclobenzaprine. Lastly, I am having her maintain her fish oil-omega-3 fatty acids,  EPINEPHrine, pregabalin, lidocaine, venlafaxine XR, lisinopril, omeprazole, gabapentin, LORazepam, nebivolol, ondansetron, and ranitidine.  Meds ordered this encounter  Medications  . methocarbamol (ROBAXIN) 500 MG tablet    Sig: Take 1 tablet (500 mg total) by mouth 2 (two) times daily as needed for muscle spasms.    Dispense:  60 tablet    Refill:  1  . gabapentin (NEURONTIN) 300 MG capsule    Sig: TAKE ONE CAPSULE BY MOUTH TWICE A DAY AND 2 CAPSULES AT BEDTIME    Dispense:  360 capsule    Refill:  0  . LORazepam (ATIVAN) 0.5 MG tablet    Sig: Take 1 tablet (0.5 mg total) by mouth every 8 (eight) hours.    Dispense:  30 tablet    Refill:  1  . nebivolol (BYSTOLIC) 5 MG tablet    Sig: Take 1 tablet (5 mg total) by mouth daily.    Dispense:  90 tablet    Refill:  1  . ondansetron (ZOFRAN) 4 MG tablet    Sig: Take 1 tablet (4 mg total) by mouth every 8 (eight) hours as needed for nausea or vomiting.    Dispense:  20 tablet    Refill:  1    Allergic to Phenergan  . ranitidine (ZANTAC) 300 MG tablet    Sig: Take 1 tablet (300 mg total) by mouth at bedtime.    Dispense:  90 tablet    Refill:  1  . cyclobenzaprine (FLEXERIL) 5 MG tablet    Sig: Take 1 tablet (5 mg total) by mouth at bedtime as needed for muscle spasms.    Dispense:  30 tablet    Refill:  1     Penni Homans, MD

## 2015-03-16 NOTE — Assessment & Plan Note (Signed)
Improved on recheck. no changes to meds. Encouraged heart healthy diet such as the DASH diet and exercise as tolerated.  

## 2015-03-24 ENCOUNTER — Telehealth: Payer: Self-pay | Admitting: Family Medicine

## 2015-03-24 NOTE — Telephone Encounter (Signed)
I have not been able to obtain records so this is via patient report if they are willing to recommend which version of MRI I should order to confirm diagnosis I would be happy to do that. Please check with them. thanks

## 2015-03-24 NOTE — Telephone Encounter (Signed)
[place] [facility] called to ask how you know this patient has a chiari malformation?

## 2015-05-20 ENCOUNTER — Other Ambulatory Visit: Payer: Self-pay | Admitting: Family Medicine

## 2015-06-10 ENCOUNTER — Ambulatory Visit (INDEPENDENT_AMBULATORY_CARE_PROVIDER_SITE_OTHER): Payer: 59 | Admitting: Family Medicine

## 2015-06-10 ENCOUNTER — Encounter: Payer: Self-pay | Admitting: Family Medicine

## 2015-06-10 VITALS — BP 122/86 | HR 96 | Temp 98.5°F | Ht 59.5 in | Wt 137.4 lb

## 2015-06-10 DIAGNOSIS — M797 Fibromyalgia: Secondary | ICD-10-CM

## 2015-06-10 DIAGNOSIS — R519 Headache, unspecified: Secondary | ICD-10-CM

## 2015-06-10 DIAGNOSIS — R51 Headache: Secondary | ICD-10-CM

## 2015-06-10 DIAGNOSIS — E079 Disorder of thyroid, unspecified: Secondary | ICD-10-CM

## 2015-06-10 DIAGNOSIS — E785 Hyperlipidemia, unspecified: Secondary | ICD-10-CM | POA: Diagnosis not present

## 2015-06-10 DIAGNOSIS — R5383 Other fatigue: Secondary | ICD-10-CM

## 2015-06-10 DIAGNOSIS — Q0700 Arnold-Chiari syndrome without spina bifida or hydrocephalus: Secondary | ICD-10-CM

## 2015-06-10 DIAGNOSIS — I10 Essential (primary) hypertension: Secondary | ICD-10-CM

## 2015-06-10 DIAGNOSIS — R000 Tachycardia, unspecified: Secondary | ICD-10-CM

## 2015-06-10 DIAGNOSIS — IMO0002 Reserved for concepts with insufficient information to code with codable children: Secondary | ICD-10-CM

## 2015-06-10 DIAGNOSIS — M25569 Pain in unspecified knee: Secondary | ICD-10-CM

## 2015-06-10 HISTORY — DX: Reserved for concepts with insufficient information to code with codable children: IMO0002

## 2015-06-10 HISTORY — DX: Arnold-Chiari syndrome without spina bifida or hydrocephalus: Q07.00

## 2015-06-10 MED ORDER — METHOCARBAMOL 500 MG PO TABS: 500.0000 mg | ORAL_TABLET | Freq: Two times a day (BID) | ORAL | Status: DC | PRN

## 2015-06-10 MED ORDER — LORAZEPAM 0.5 MG PO TABS: 0.5000 mg | ORAL_TABLET | Freq: Three times a day (TID) | ORAL | Status: DC

## 2015-06-10 NOTE — Assessment & Plan Note (Signed)
Insurance is still not paying for her Lyrica despite submitting a letter.

## 2015-06-10 NOTE — Assessment & Plan Note (Signed)
Very severe with the fibromyalgia. Would consider a trial of Ritalin once tachycardia is improved.

## 2015-06-10 NOTE — Patient Instructions (Addendum)
Salonpas with lidocaine and aspercreme and icy hot with  Lidocaine. NOW Probiotic Vitamin. Available at [employer]. Fiber supplement twice daily such as Benefiber, citracel and Metamucil.  [employer]ucky[employer].com. Curcumin. .    Allergies An allergy is an abnormal reaction to a substance by the body's defense system (immune system). Allergies can develop at any age. WHAT CAUSES ALLERGIES? An allergic reaction happens when the immune system mistakenly reacts to a normally harmless substance, called an allergen, as if it were harmful. The immune system releases antibodies to fight the substance. Antibodies eventually release a chemical called histamine into the bloodstream. The release of histamine is meant to protect the body from infection, but it also causes discomfort. An allergic reaction can be triggered by:  Eating an allergen.  Inhaling an allergen.  Touching an allergen. WHAT TYPES OF ALLERGIES ARE THERE? There are many types of allergies. Common types include:  Seasonal allergies. People with this type of allergy are usually allergic to substances that are only present during certain seasons, such as molds and pollens.  Food allergies.  Drug allergies.  Insect allergies.  Animal dander allergies. WHAT ARE SYMPTOMS OF ALLERGIES? Possible allergy symptoms include:  Swelling of the lips, face, tongue, mouth, or throat.  Sneezing, coughing, or wheezing.  Nasal congestion.  Tingling in the mouth.  Rash.  Itching.  Itchy, red, swollen areas of skin (hives).  Watery eyes.  Vomiting.  Diarrhea.  Dizziness.  Lightheadedness.  Fainting.  Trouble breathing or swallowing.  Chest tightness.  Rapid heartbeat. HOW ARE ALLERGIES DIAGNOSED? Allergies are diagnosed with a medical and family history and one or more of the following:  Skin tests.  Blood tests.  A food diary. A food diary is a record of all the foods and drinks you have in a day and of all the  symptoms you experience.  The results of an elimination diet. An elimination diet involves eliminating foods from your diet and then adding them back in one by one to find out if a certain food causes an allergic reaction. HOW ARE ALLERGIES TREATED? There is no cure for allergies, but allergic reactions can be treated with medicine. Severe reactions usually need to be treated at a hospital. HOW CAN REACTIONS BE PREVENTED? The best way to prevent an allergic reaction is by avoiding the substance you are allergic to. Allergy shots and medicines can also help prevent reactions in some cases. People with severe allergic reactions may be able to prevent a life-threatening reaction called anaphylaxis with a medicine given right after exposure to the allergen.   This information is not intended to replace advice given to you by your health care provider. Make sure you discuss any questions you have with your health care provider.   Document Released: 04/13/2002 Document Revised: 02/08/2014 Document Reviewed: 10/30/2013 Elsevier Interactive Patient Education [employer].

## 2015-06-10 NOTE — Assessment & Plan Note (Signed)
Encouraged heart healthy diet, increase exercise, avoid trans fats, consider a krill oil cap daily 

## 2015-06-10 NOTE — Assessment & Plan Note (Signed)
Declined lab work today

## 2015-06-10 NOTE — Assessment & Plan Note (Addendum)
Mild, improved with recheck increase Bystolic to 10 mg daily

## 2015-06-10 NOTE — Assessment & Plan Note (Signed)
Headaches are improving but she is stillworried about this diagnosis. She is reassured it is unlikely the cause of her headaches

## 2015-06-10 NOTE — Progress Notes (Signed)
Subjective:    Patient ID: Lauren Hall, female    DOB: 01/13/1971, 45 y.o.   MRN: [id number]  Chief Complaint  Patient presents with  . Follow-up    HPI Patient is in today for follow up.  Patient presents today with some concerns about severe environmental allergies, reports taking the otc Xyzal and it caused some insomnia and night dreams.  Patient reports she still has been having headaches, but she has been getting more sweets and drinking more water.  Still struggles with severe fatigue from her fibromyalgias. Some days unable to function. Denies CP/palp/SOB/HA/congestion/fevers/GI or GU c/o. Taking meds as prescribed.    Past Medical History  Diagnosis Date  . Anxiety   . Fibromyalgia 2009  . Fibromyalgia   . Anemia   . Allergy   . Depression   . Hypertension   . Fatigue 10/08/2010  . Headache(784.0) 10/08/2010  . Thyroid disease 10/08/2010  . Hip pain, bilateral 11/06/2010  . Poor concentration 01/11/2011  . Bronchitis 07/27/2011  . Tick bite of flank 11/09/2011  . Other and unspecified hyperlipidemia 07/29/2012  . Preventative health care 07/29/2012  . Peripheral neuropathy ([place]) 11/11/2012  . Tinea corporis 11/11/2012  . GERD (gastroesophageal reflux disease) 09/26/2013  . Sinusitis, acute 07/27/2011  . Low back pain radiating to right leg 03/11/2015  . 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    Past Surgical History  Procedure Laterality Date  . Breast lumpectomy  1999    right- benign    Family History  Problem Relation Age of Onset  . Fibromyalgia Mother   . Diabetes Mother     type 2  . Hypertension Mother   . Hyperlipidemia Mother   . Alcohol abuse Father   . Cancer Father     colon  . Hypertension Brother   . Alcohol abuse Brother   . Heart disease Maternal Grandmother   . Hyperlipidemia Maternal Grandmother   . Diabetes Maternal Grandmother     type 2  . Cancer Maternal Grandmother     colon  . Stroke Maternal Grandfather   . Diabetes Maternal Grandfather      type 2  . Cancer Paternal Grandmother     ovarian  . Alzheimer's disease Paternal Grandfather     Social History   Social History  . Marital Status: Married    Spouse Name: N/A  . Number of Children: N/A  . Years of Education: N/A   Occupational History  . Not on file.   Social History Main Topics  . Smoking status: Never Smoker   . Smokeless tobacco: Never Used  . Alcohol Use: Yes     Comment: special occassion- very rarely  . Drug Use: No  . Sexual Activity:    Partners: Male   Other Topics Concern  . Not on file   Social History Narrative    Outpatient Prescriptions Prior to Visit  Medication Sig Dispense Refill  . EPINEPHrine (EPI-PEN) 0.3 mg/0.3 mL DEVI Inject 0.3 mg into the muscle once. Reported on 03/11/2015    . fish oil-omega-3 fatty acids 1000 MG capsule MegaRed 1 cap by mouth daily (by Schiff) 30 capsule 1  . gabapentin (NEURONTIN) 300 MG capsule TAKE ONE CAPSULE BY MOUTH TWICE A DAY AND 2 CAPSULES AT BEDTIME 360 capsule 0  . lidocaine (LIDODERM) 5 % Place 2 patches onto the skin daily. Remove & Discard patch within 12 hours or as directed by MD 60 patch 1  . lisinopril (PRINIVIL,ZESTRIL) 10 MG tablet Take  1 tablet (10 mg total) by mouth 2 (two) times daily. 180 tablet 0  . nebivolol (BYSTOLIC) 5 MG tablet Take 1 tablet (5 mg total) by mouth daily. 90 tablet 1  . omeprazole (PRILOSEC) 40 MG capsule TAKE 1 CAPSULE BY MOUTH EVERY DAY 90 capsule 0  . ondansetron (ZOFRAN) 4 MG tablet Take 1 tablet (4 mg total) by mouth every 8 (eight) hours as needed for nausea or vomiting. 20 tablet 1  . ranitidine (ZANTAC) 300 MG tablet Take 1 tablet (300 mg total) by mouth at bedtime. 90 tablet 1  . venlafaxine XR (EFFEXOR-XR) 37.5 MG 24 hr capsule TAKE 1 CAPSULE(37.5 MG) BY MOUTH DAILY WITH BREAKFAST 30 capsule 1  . cyclobenzaprine (FLEXERIL) 5 MG tablet Take 1 tablet (5 mg total) by mouth at bedtime as needed for muscle spasms. 30 tablet 1  . LORazepam (ATIVAN) 0.5 MG  tablet Take 1 tablet (0.5 mg total) by mouth every 8 (eight) hours. 30 tablet 1  . methocarbamol (ROBAXIN) 500 MG tablet Take 1 tablet (500 mg total) by mouth 2 (two) times daily as needed for muscle spasms. 60 tablet 1  . pregabalin (LYRICA) 50 MG capsule ONE TAB THREE TIMES A DAY FOR 2 WEEKS THEN 2 TABS THREE TIMES A DAY 135 capsule 1   No facility-administered medications prior to visit.    Allergies  Allergen Reactions  . Promethazine Anaphylaxis    Review of Systems  Constitutional: Negative for fever and malaise/fatigue.  HENT: Negative for congestion.   Eyes: Negative for blurred vision.  Respiratory: Negative for shortness of breath.   Cardiovascular: Negative for chest pain, palpitations and leg swelling.  Gastrointestinal: Negative for nausea, abdominal pain and blood in stool.  Genitourinary: Negative for dysuria and frequency.  Musculoskeletal: Positive for myalgias and back pain. Negative for falls.  Skin: Negative for rash.  Neurological: Positive for headaches. Negative for dizziness and loss of consciousness.  Endo/Heme/Allergies: Positive for environmental allergies.  Psychiatric/Behavioral: Negative for depression. The patient is nervous/anxious.        Objective:    Physical Exam  Constitutional: She is oriented to person, place, and time. She appears well-developed and well-nourished. No distress.  HENT:  Head: Normocephalic and atraumatic.  Eyes: Conjunctivae are normal.  Neck: Neck supple. No thyromegaly present.  Cardiovascular: Normal rate, regular rhythm and normal heart sounds.   No murmur heard. Pulmonary/Chest: Effort normal and breath sounds normal. No respiratory distress.  Abdominal: Soft. Bowel sounds are normal. She exhibits no distension and no mass. There is no tenderness.  Musculoskeletal: She exhibits no edema.  Lymphadenopathy:    She has no cervical adenopathy.  Neurological: She is alert and oriented to person, place, and time.    Skin: Skin is warm and dry.  Psychiatric: She has a normal mood and affect. Her behavior is normal.    BP 122/86 mmHg  Pulse 96  Temp(Src) 98.5 F (36.9 C) (Oral)  Ht 4' 11.5" (1.511 m)  Wt 137 lb 6 oz (62.313 kg)  BMI 27.29 kg/m2  SpO2 98% Wt Readings from Last 3 Encounters:  06/10/15 137 lb 6 oz (62.313 kg)  03/11/15 142 lb 2 oz (64.467 kg)  06/24/14 135 lb 4 oz (61.349 kg)     Lab Results  Component Value Date   WBC 9.6 06/24/2014   HGB 13.1 06/24/2014   HCT 38.5 06/24/2014   PLT 407.0* 06/24/2014   GLUCOSE 112* 06/24/2014   CHOL 235* 06/24/2014   TRIG 155.0* 06/24/2014  HDL 78.50 06/24/2014   LDLCALC 126* 06/24/2014   ALT 14 06/24/2014   AST 16 06/24/2014   NA 139 06/24/2014   K 3.5 06/24/2014   CL 102 06/24/2014   CREATININE 0.73 06/24/2014   BUN 7 06/24/2014   CO2 31 06/24/2014   TSH 0.55 06/24/2014   HGBA1C 5.4 06/25/2014    Lab Results  Component Value Date   TSH 0.55 06/24/2014   Lab Results  Component Value Date   WBC 9.6 06/24/2014   HGB 13.1 06/24/2014   HCT 38.5 06/24/2014   MCV 91.3 06/24/2014   PLT 407.0* 06/24/2014   Lab Results  Component Value Date   NA 139 06/24/2014   K 3.5 06/24/2014   CO2 31 06/24/2014   GLUCOSE 112* 06/24/2014   BUN 7 06/24/2014   CREATININE 0.73 06/24/2014   BILITOT 0.4 06/24/2014   ALKPHOS 77 06/24/2014   AST 16 06/24/2014   ALT 14 06/24/2014   PROT 7.2 06/24/2014   ALBUMIN 4.3 06/24/2014   CALCIUM 9.3 06/24/2014   GFR 110.58 06/24/2014   Lab Results  Component Value Date   CHOL 235* 06/24/2014   Lab Results  Component Value Date   HDL 78.50 06/24/2014   Lab Results  Component Value Date   LDLCALC 126* 06/24/2014   Lab Results  Component Value Date   TRIG 155.0* 06/24/2014   Lab Results  Component Value Date   CHOLHDL 3 06/24/2014   Lab Results  Component Value Date   HGBA1C 5.4 06/25/2014       Assessment & Plan:   Problem List Items Addressed This Visit    Thyroid disease     Declined lab work today      Tachycardia    Mild, improved with recheck increase Bystolic to 10 mg daily      Hypertension    Well controlled, no changes to meds. Encouraged heart healthy diet such as the DASH diet and exercise as tolerated.       Hyperlipidemia - Primary    Encouraged heart healthy diet, increase exercise, avoid trans fats, consider a krill oil cap daily      Headache    Improved some with increased hydration and sleep and stopping artificial sweeteners. Still occurs some but improved.       Relevant Medications   methocarbamol (ROBAXIN) 500 MG tablet   Other Relevant Orders   Ambulatory referral to Neurosurgery   Fibromyalgia    Insurance is still not paying for her Lyrica despite submitting a letter.       Fatigue    Very severe with the fibromyalgia. Would consider a trial of Ritalin once tachycardia is improved.      Chiari malformation    Headaches are improving but she is stillworried about this diagnosis. She is reassured it is unlikely the cause of her headaches       Other Visit Diagnoses    Pain in joint, lower leg, unspecified laterality        Relevant Medications    methocarbamol (ROBAXIN) 500 MG tablet       I have discontinued Ms. Mclane's pregabalin and cyclobenzaprine. I am also having her maintain her fish oil-omega-3 fatty acids, EPINEPHrine, lidocaine, lisinopril, omeprazole, gabapentin, nebivolol, ondansetron, ranitidine, venlafaxine XR, methocarbamol, and LORazepam.  Meds ordered this encounter  Medications  . methocarbamol (ROBAXIN) 500 MG tablet    Sig: Take 1 tablet (500 mg total) by mouth 2 (two) times daily as needed for muscle spasms.  Dispense:  60 tablet    Refill:  1  . LORazepam (ATIVAN) 0.5 MG tablet    Sig: Take 1 tablet (0.5 mg total) by mouth every 8 (eight) hours.    Dispense:  30 tablet    Refill:  1     Penni Homans, MD

## 2015-06-10 NOTE — Assessment & Plan Note (Signed)
Well controlled, no changes to meds. Encouraged heart healthy diet such as the DASH diet and exercise as tolerated.  °

## 2015-06-10 NOTE — Assessment & Plan Note (Addendum)
Has been worse lately, tried Xyzal but caused excessive sedation and strange dreams

## 2015-06-10 NOTE — Progress Notes (Signed)
Pre visit review using our clinic review tool, if applicable. No additional management support is needed unless otherwise documented below in the visit note. 

## 2015-06-10 NOTE — Assessment & Plan Note (Signed)
Improved some with increased hydration and sleep and stopping artificial sweeteners. Still occurs some but improved.

## 2015-06-10 NOTE — Assessment & Plan Note (Signed)
Avoid offending foods, start probiotics. Do not eat large meals in late evening and consider raising head of bed.  

## 2015-07-14 ENCOUNTER — Other Ambulatory Visit: Payer: Self-pay | Admitting: Family Medicine

## 2015-07-25 ENCOUNTER — Other Ambulatory Visit: Payer: Self-pay | Admitting: Family Medicine

## 2015-08-06 ENCOUNTER — Other Ambulatory Visit: Payer: Self-pay | Admitting: Obstetrics & Gynecology

## 2015-08-07 LAB — CYTOLOGY - PAP

## 2015-08-11 ENCOUNTER — Encounter: Payer: Self-pay | Admitting: Family Medicine

## 2015-08-11 ENCOUNTER — Ambulatory Visit (INDEPENDENT_AMBULATORY_CARE_PROVIDER_SITE_OTHER): Payer: 59 | Admitting: Family Medicine

## 2015-08-11 VITALS — BP 112/78 | HR 74 | Temp 98.8°F | Ht 60.0 in | Wt 139.1 lb

## 2015-08-11 DIAGNOSIS — K219 Gastro-esophageal reflux disease without esophagitis: Secondary | ICD-10-CM | POA: Diagnosis not present

## 2015-08-11 DIAGNOSIS — I10 Essential (primary) hypertension: Secondary | ICD-10-CM

## 2015-08-11 DIAGNOSIS — R5383 Other fatigue: Secondary | ICD-10-CM

## 2015-08-11 DIAGNOSIS — E079 Disorder of thyroid, unspecified: Secondary | ICD-10-CM | POA: Diagnosis not present

## 2015-08-11 DIAGNOSIS — E785 Hyperlipidemia, unspecified: Secondary | ICD-10-CM

## 2015-08-11 DIAGNOSIS — E1169 Type 2 diabetes mellitus with other specified complication: Secondary | ICD-10-CM | POA: Insufficient documentation

## 2015-08-11 DIAGNOSIS — R000 Tachycardia, unspecified: Secondary | ICD-10-CM

## 2015-08-11 DIAGNOSIS — R739 Hyperglycemia, unspecified: Secondary | ICD-10-CM

## 2015-08-11 DIAGNOSIS — T7840XD Allergy, unspecified, subsequent encounter: Secondary | ICD-10-CM

## 2015-08-11 HISTORY — DX: Hyperglycemia, unspecified: R73.9

## 2015-08-11 NOTE — Assessment & Plan Note (Signed)
minimize simple carbs. Increase exercise as tolerated.  

## 2015-08-11 NOTE — Patient Instructions (Signed)

## 2015-08-11 NOTE — Progress Notes (Signed)
Pre visit review using our clinic review tool, if applicable. No additional management support is needed unless otherwise documented below in the visit note. 

## 2015-08-11 NOTE — Assessment & Plan Note (Signed)
RRR today 

## 2015-08-11 NOTE — Assessment & Plan Note (Signed)
Well controlled, no changes to meds. Encouraged heart healthy diet such as the DASH diet and exercise as tolerated.  °

## 2015-08-11 NOTE — Assessment & Plan Note (Signed)
Encouraged heart healthy diet, increase exercise, avoid trans fats, consider a krill oil cap daily 

## 2015-08-11 NOTE — Assessment & Plan Note (Signed)
Patient unable to afford her Epipen due to cost.

## 2015-08-12 ENCOUNTER — Telehealth: Payer: Self-pay | Admitting: Family Medicine

## 2015-08-12 LAB — LIPID PANEL
CHOL/HDL RATIO: 3
Cholesterol: 254 mg/dL — ABNORMAL HIGH (ref 0–200)
HDL: 79.2 mg/dL (ref >39.00)
LDL CALC: 155 mg/dL — AB (ref 0–99)
NONHDL: 175.16
Triglycerides: 102 mg/dL (ref 0.0–149.0)
VLDL: 20.4 mg/dL (ref 0.0–40.0)

## 2015-08-12 LAB — CBC
HEMATOCRIT: 37.1 % (ref 36.0–46.0)
HEMOGLOBIN: 12.2 g/dL (ref 12.0–15.0)
MCHC: 32.8 g/dL (ref 30.0–36.0)
MCV: 89.1 fl (ref 78.0–100.0)
Platelets: 279 10*3/uL (ref 150.0–400.0)
RBC: 4.17 Mil/uL (ref 3.87–5.11)
RDW: 15.8 % — AB (ref 11.5–15.5)
WBC: 5 10*3/uL (ref 4.0–10.5)

## 2015-08-12 LAB — COMPREHENSIVE METABOLIC PANEL
ALT: 10 U/L (ref 0–35)
AST: 15 U/L (ref 0–37)
Albumin: 4.4 g/dL (ref 3.5–5.2)
Alkaline Phosphatase: 70 U/L (ref 39–117)
BILIRUBIN TOTAL: 0.3 mg/dL (ref 0.2–1.2)
BUN: 8 mg/dL (ref 6–23)
CALCIUM: 9.7 mg/dL (ref 8.4–10.5)
CHLORIDE: 103 meq/L (ref 96–112)
CO2: 29 mEq/L (ref 19–32)
CREATININE: 0.79 mg/dL (ref 0.40–1.20)
GFR: 100.45 mL/min (ref >60.00)
GLUCOSE: 85 mg/dL (ref 70–99)
Potassium: 4.6 mEq/L (ref 3.5–5.1)
SODIUM: 137 meq/L (ref 135–145)
Total Protein: 7.4 g/dL (ref 6.0–8.3)

## 2015-08-12 LAB — TSH: TSH: 0.61 u[IU]/mL (ref 0.35–4.50)

## 2015-08-12 LAB — HEMOGLOBIN A1C: Hgb A1c MFr Bld: 5.4 % (ref 4.6–6.5)

## 2015-08-12 NOTE — Telephone Encounter (Signed)
See lab results dated 08/11/2015

## 2015-08-12 NOTE — Telephone Encounter (Signed)
Pt returning call for lab results  

## 2015-08-24 NOTE — Progress Notes (Signed)
Patient ID: Lauren Hall, female   DOB: 20-Apr-1970, 45 y.o.   MRN: [id number]   Subjective:    Patient ID: Lauren Hall, female    DOB: 1970-11-02, 45 y.o.   MRN: [id number]  Chief Complaint  Patient presents with  . Follow-up    HPI Patient is in today for follow up. She is at her baseline. No recent illness or hospitalizations. She continues to endorse ongoing fatigue, chronic pain and myalgias. Denies CP/palp/SOB/HA/congestion/fevers/GI or GU c/o. Taking meds as prescribed  Past Medical History:  Diagnosis Date  . Allergy   . Anemia   . Anxiety   . Bronchitis 07/27/2011  . 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 Depression   . Fatigue 10/08/2010  . Fibromyalgia 2009  . Fibromyalgia   . GERD (gastroesophageal reflux disease) 09/26/2013  . Headache(784.0) 10/08/2010  . Hip pain, bilateral 11/06/2010  . Hyperglycemia 08/11/2015  . Hypertension   . Low back pain radiating to right leg 03/11/2015  . Other and unspecified hyperlipidemia 07/29/2012  . Peripheral neuropathy ([place]) 11/11/2012  . Poor concentration 01/11/2011  . Preventative health care 07/29/2012  . Sinusitis, acute 07/27/2011  . Thyroid disease 10/08/2010  . Tick bite of flank 11/09/2011  . Tinea corporis 11/11/2012    Past Surgical History:  Procedure Laterality Date  . BREAST LUMPECTOMY  1999   right- benign    Family History  Problem Relation Age of Onset  . Fibromyalgia Mother   . Diabetes Mother     type 2  . Hypertension Mother   . Hyperlipidemia Mother   . Alcohol abuse Father   . Cancer Father     colon  . Hypertension Brother   . Alcohol abuse Brother   . Heart disease Maternal Grandmother   . Hyperlipidemia Maternal Grandmother   . Diabetes Maternal Grandmother     type 2  . Cancer Maternal Grandmother     colon  . Stroke Maternal Grandfather   . Diabetes Maternal Grandfather     type 2  . Cancer Paternal Grandmother     ovarian  . Alzheimer's disease Paternal Grandfather     Social History    Social History  . Marital status: Married    Spouse name: N/A  . Number of children: N/A  . Years of education: N/A   Occupational History  . Not on file.   Social History Main Topics  . Smoking status: Never Smoker  . Smokeless tobacco: Never Used  . Alcohol use Yes     Comment: special occassion- very rarely  . Drug use: No  . Sexual activity: Yes    Partners: Male   Other Topics Concern  . Not on file   Social History Narrative  . No narrative on file    Outpatient Medications Prior to Visit  Medication Sig Dispense Refill  . EPINEPHrine (EPI-PEN) 0.3 mg/0.3 mL DEVI Inject 0.3 mg into the muscle once. Reported on 03/11/2015    . fish oil-omega-3 fatty acids 1000 MG capsule MegaRed 1 cap by mouth daily (by Schiff) 30 capsule 1  . gabapentin (NEURONTIN) 300 MG capsule TAKE ONE CAPSULE BY MOUTH TWICE A DAY AND 2 CAPSULES AT BEDTIME 360 capsule 0  . lidocaine (LIDODERM) 5 % Place 2 patches onto the skin daily. Remove & Discard patch within 12 hours or as directed by MD 60 patch 1  . lisinopril (PRINIVIL,ZESTRIL) 10 MG tablet Take 1 tablet (10 mg total) by mouth 2 (two) times daily. 180 tablet 0  .  LORazepam (ATIVAN) 0.5 MG tablet Take 1 tablet (0.5 mg total) by mouth every 8 (eight) hours. 30 tablet 1  . methocarbamol (ROBAXIN) 500 MG tablet Take 1 tablet (500 mg total) by mouth 2 (two) times daily as needed for muscle spasms. 60 tablet 1  . nebivolol (BYSTOLIC) 5 MG tablet Take 1 tablet (5 mg total) by mouth daily. 90 tablet 1  . omeprazole (PRILOSEC) 40 MG capsule TAKE 1 CAPSULE BY MOUTH EVERY DAY 90 capsule 0  . ondansetron (ZOFRAN) 4 MG tablet Take 1 tablet (4 mg total) by mouth every 8 (eight) hours as needed for nausea or vomiting. 20 tablet 1  . ranitidine (ZANTAC) 300 MG tablet Take 1 tablet (300 mg total) by mouth at bedtime. 90 tablet 1  . venlafaxine XR (EFFEXOR-XR) 37.5 MG 24 hr capsule TAKE 1 CAPSULE(37.5 MG) BY MOUTH DAILY WITH BREAKFAST 30 capsule 3   No  facility-administered medications prior to visit.     Allergies  Allergen Reactions  . Promethazine Anaphylaxis    Review of Systems  Constitutional: Positive for malaise/fatigue. Negative for fever.  HENT: Negative for congestion.   Eyes: Negative for blurred vision.  Respiratory: Negative for shortness of breath.   Cardiovascular: Negative for chest pain, palpitations and leg swelling.  Gastrointestinal: Negative for abdominal pain, blood in stool and nausea.  Genitourinary: Negative for dysuria and frequency.  Musculoskeletal: Positive for myalgias. Negative for falls.  Skin: Negative for rash.  Neurological: Negative for dizziness, loss of consciousness and headaches.  Endo/Heme/Allergies: Negative for environmental allergies.  Psychiatric/Behavioral: Negative for depression. The patient is not nervous/anxious.        Objective:    Physical Exam  Constitutional: She is oriented to person, place, and time. She appears well-developed and well-nourished. No distress.  HENT:  Head: Normocephalic and atraumatic.  Nose: Nose normal.  Eyes: Right eye exhibits no discharge. Left eye exhibits no discharge.  Neck: Normal range of motion. Neck supple.  Cardiovascular: Normal rate and regular rhythm.   No murmur heard. Pulmonary/Chest: Effort normal and breath sounds normal.  Abdominal: Soft. Bowel sounds are normal. There is no tenderness.  Musculoskeletal: She exhibits no edema.  Neurological: She is alert and oriented to person, place, and time.  Skin: Skin is warm and dry.  Psychiatric: She has a normal mood and affect.  Nursing note and vitals reviewed.   BP 112/78   Pulse 74   Temp 98.8 F (37.1 C) (Oral)   Ht 5' (1.524 m)   Wt 139 lb 2 oz (63.1 kg)   SpO2 98%   BMI 27.17 kg/m  Wt Readings from Last 3 Encounters:  08/11/15 139 lb 2 oz (63.1 kg)  06/10/15 137 lb 6 oz (62.3 kg)  03/11/15 142 lb 2 oz (64.5 kg)     Lab Results  Component Value Date   WBC 5.0  08/11/2015   HGB 12.2 08/11/2015   HCT 37.1 08/11/2015   PLT 279.0 08/11/2015   GLUCOSE 85 08/11/2015   CHOL 254 (H) 08/11/2015   TRIG 102.0 08/11/2015   HDL 79.20 08/11/2015   LDLCALC 155 (H) 08/11/2015   ALT 10 08/11/2015   AST 15 08/11/2015   NA 137 08/11/2015   K 4.6 08/11/2015   CL 103 08/11/2015   CREATININE 0.79 08/11/2015   BUN 8 08/11/2015   CO2 29 08/11/2015   TSH 0.61 08/11/2015   HGBA1C 5.4 08/11/2015    Lab Results  Component Value Date   TSH 0.61 08/11/2015  Lab Results  Component Value Date   WBC 5.0 08/11/2015   HGB 12.2 08/11/2015   HCT 37.1 08/11/2015   MCV 89.1 08/11/2015   PLT 279.0 08/11/2015   Lab Results  Component Value Date   NA 137 08/11/2015   K 4.6 08/11/2015   CO2 29 08/11/2015   GLUCOSE 85 08/11/2015   BUN 8 08/11/2015   CREATININE 0.79 08/11/2015   BILITOT 0.3 08/11/2015   ALKPHOS 70 08/11/2015   AST 15 08/11/2015   ALT 10 08/11/2015   PROT 7.4 08/11/2015   ALBUMIN 4.4 08/11/2015   CALCIUM 9.7 08/11/2015   GFR 100.45 08/11/2015   Lab Results  Component Value Date   CHOL 254 (H) 08/11/2015   Lab Results  Component Value Date   HDL 79.20 08/11/2015   Lab Results  Component Value Date   LDLCALC 155 (H) 08/11/2015   Lab Results  Component Value Date   TRIG 102.0 08/11/2015   Lab Results  Component Value Date   CHOLHDL 3 08/11/2015   Lab Results  Component Value Date   HGBA1C 5.4 08/11/2015       Assessment & Plan:   Problem List Items Addressed This Visit    Allergic state    Patient unable to afford her Epipen due to cost.       Hypertension - Primary    Well controlled, no changes to meds. Encouraged heart healthy diet such as the DASH diet and exercise as tolerated.       Relevant Orders   CBC (Completed)   TSH (Completed)   Hemoglobin A1c (Completed)   Lipid panel (Completed)   Comprehensive metabolic panel (Completed)   Fatigue    Continues to struggle with fatigue. Encouraged 7-8 hours of  sleep, regular exercise, small frequent meals with lean proteins and minimal simple carbs      Thyroid disease   Relevant Orders   CBC (Completed)   TSH (Completed)   Hemoglobin A1c (Completed)   Lipid panel (Completed)   Comprehensive metabolic panel (Completed)   Tachycardia    RRR today      Hyperlipidemia    Encouraged heart healthy diet, increase exercise, avoid trans fats, consider a krill oil cap daily      Relevant Orders   CBC (Completed)   TSH (Completed)   Hemoglobin A1c (Completed)   Lipid panel (Completed)   Comprehensive metabolic panel (Completed)   GERD (gastroesophageal reflux disease)    Avoid offending foods, start probiotics. Do not eat large meals in late evening and consider raising head of bed.       Relevant Orders   CBC (Completed)   TSH (Completed)   Hemoglobin A1c (Completed)   Lipid panel (Completed)   Comprehensive metabolic panel (Completed)   Hyperglycemia    minimize simple carbs. Increase exercise as tolerated.       Relevant Orders   CBC (Completed)   TSH (Completed)   Hemoglobin A1c (Completed)   Lipid panel (Completed)   Comprehensive metabolic panel (Completed)    Other Visit Diagnoses   None.     I am having Ms. Gagliano maintain her fish oil-omega-3 fatty acids, EPINEPHrine, lidocaine, lisinopril, gabapentin, nebivolol, ondansetron, ranitidine, methocarbamol, LORazepam, omeprazole, and venlafaxine XR.  No orders of the defined types were placed in this encounter.    Penni Homans, MD

## 2015-08-24 NOTE — Assessment & Plan Note (Signed)
Avoid offending foods, start probiotics. Do not eat large meals in late evening and consider raising head of bed.  

## 2015-08-24 NOTE — Assessment & Plan Note (Signed)
Continues to struggle with fatigue. Encouraged 7-8 hours of sleep, regular exercise, small frequent meals with lean proteins and minimal simple carbs

## 2015-09-03 ENCOUNTER — Other Ambulatory Visit: Payer: Self-pay | Admitting: Obstetrics & Gynecology

## 2015-09-05 ENCOUNTER — Encounter: Payer: Self-pay | Admitting: Gastroenterology

## 2015-09-09 ENCOUNTER — Other Ambulatory Visit: Payer: Self-pay | Admitting: Obstetrics & Gynecology

## 2015-09-23 ENCOUNTER — Other Ambulatory Visit: Payer: Self-pay | Admitting: Family Medicine

## 2015-09-23 NOTE — Telephone Encounter (Signed)
Last Lorazepam refill on 06/10/2015  #30 with 1 refill Last Office visit on 08/11/2015

## 2015-09-24 MED ORDER — LORAZEPAM 0.5 MG PO TABS: 0.5000 mg | ORAL_TABLET | Freq: Two times a day (BID) | ORAL | 1 refills | Status: DC | PRN

## 2015-09-24 NOTE — Telephone Encounter (Signed)
Rx printed, awaiting MD signature.  

## 2015-09-24 NOTE — Patient Instructions (Addendum)
Your procedure is scheduled on:  Tuesday, Sept. 5, 2017  Enter through the [employer] [facility] at:  6:00 AM  Pick up the phone at the desk and dial [phone].  Call this number if you have problems the morning of surgery: [phone].  Remember: Do NOT eat food or drink after:  Midnight Monday Sept. 4, 2017  Take these medicines the morning of surgery with a SIP OF WATER:  Gabapentin, Lisinopril, Bystolic, Omeprazole, Venlafaxine  Do NOT wear jewelry (body piercing), metal hair clips/bobby pins, make-up, or nail polish. Do NOT wear lotions, powders, or perfumes.  You may wear deodorant. Do NOT shave for 48 hours prior to surgery. Do NOT bring valuables to the hospital. Contacts, dentures, or bridgework may not be worn into surgery.  Leave suitcase in car.  After surgery it may be brought to your room.  For patients admitted to the hospital, checkout time is 11:00 AM the day of discharge.

## 2015-09-25 ENCOUNTER — Encounter (HOSPITAL_COMMUNITY)
Admission: RE | Admit: 2015-09-25 | Discharge: 2015-09-25 | Disposition: A | Discharge status: Home / Self Care | Payer: 59 | Source: Ambulatory Visit | Attending: Obstetrics & Gynecology | Admitting: Obstetrics & Gynecology

## 2015-09-25 ENCOUNTER — Encounter (HOSPITAL_COMMUNITY): Payer: Self-pay

## 2015-09-25 DIAGNOSIS — Z01810 Encounter for preprocedural cardiovascular examination: Secondary | ICD-10-CM | POA: Diagnosis present

## 2015-09-25 DIAGNOSIS — Z01812 Encounter for preprocedural laboratory examination: Secondary | ICD-10-CM | POA: Insufficient documentation

## 2015-09-25 HISTORY — DX: Nontoxic goiter, unspecified: E04.9

## 2015-09-25 HISTORY — DX: Other complications of anesthesia, initial encounter: T88.59XA

## 2015-09-25 HISTORY — DX: Cardiac arrhythmia, unspecified: I49.9

## 2015-09-25 HISTORY — DX: Adverse effect of unspecified anesthetic, initial encounter: T41.45XA

## 2015-09-25 LAB — TYPE AND SCREEN
ABO/RH(D): O POS
ANTIBODY SCREEN: NEGATIVE

## 2015-09-25 LAB — BASIC METABOLIC PANEL
Anion gap: 6 (ref 5–15)
BUN: 5 mg/dL — ABNORMAL LOW (ref 6–20)
CHLORIDE: 102 mmol/L (ref 101–111)
CO2: 28 mmol/L (ref 22–32)
CREATININE: 0.86 mg/dL (ref 0.44–1.00)
Calcium: 9.1 mg/dL (ref 8.9–10.3)
GFR calc non Af Amer: >60 mL/min (ref >60)
Glucose, Bld: 85 mg/dL (ref 65–99)
POTASSIUM: 4 mmol/L (ref 3.5–5.1)
Sodium: 136 mmol/L (ref 135–145)

## 2015-09-25 LAB — CBC
HEMATOCRIT: 35.8 % — AB (ref 36.0–46.0)
Hemoglobin: 12.4 g/dL (ref 12.0–15.0)
MCH: 29.7 pg (ref 26.0–34.0)
MCHC: 34.6 g/dL (ref 30.0–36.0)
MCV: 85.9 fL (ref 78.0–100.0)
Platelets: 357 10*3/uL (ref 150–400)
RBC: 4.17 MIL/uL (ref 3.87–5.11)
RDW: 14.6 % (ref 11.5–15.5)
WBC: 5.2 10*3/uL (ref 4.0–10.5)

## 2015-09-25 LAB — ABO/RH: ABO/RH(D): O POS

## 2015-09-25 NOTE — Telephone Encounter (Signed)
Faxed hardcopy for [facility] for [place] to Lorazepam

## 2015-10-06 NOTE — Anesthesia Preprocedure Evaluation (Addendum)
Anesthesia Evaluation  Patient identified by MRN, date of birth, ID band Patient awake    Reviewed: Allergy & Precautions, NPO status , Patient's Chart, lab work & pertinent test results  History of Anesthesia Complications (+) history of anesthetic complications (patient states she woke up during closing of a breast lumpectomy)  Airway Mallampati: III  TM Distance: >3 FB Neck ROM: Full    Dental  (+) Teeth Intact, Dental Advisory Given   Pulmonary neg pulmonary ROS, neg shortness of breath, neg sleep apnea, neg COPD, neg recent URI,    Pulmonary exam normal breath sounds clear to auscultation       Cardiovascular hypertension, Pt. on medications (-) angina(-) CAD, (-) Past MI, (-) Cardiac Stents, (-) CABG and (-) Orthopnea + dysrhythmias (h/o tachycardia)  Rhythm:Regular Rate:Normal     Neuro/Psych  Headaches, neg Seizures PSYCHIATRIC DISORDERS Anxiety Depression Peripheral neuropathy, low back pain radiating to right leg, Chiari malformation    GI/Hepatic Neg liver ROS, GERD  Medicated and Controlled,  Endo/Other  neg diabetesEnlarged thyroid  Renal/GU negative Renal ROS     Musculoskeletal  (+) Fibromyalgia -  Abdominal   Peds  Hematology  (+) Blood dyscrasia, anemia ,   Anesthesia Other Findings HLD  Reproductive/Obstetrics                            Anesthesia Physical Anesthesia Plan  ASA: III  Anesthesia Plan: General   Post-op Pain Management:    Induction: Intravenous  Airway Management Planned: Oral ETT  Additional Equipment:   Intra-op Plan:   Post-operative Plan: Extubation in OR  Informed Consent: I have reviewed the patients History and Physical, chart, labs and discussed the procedure including the risks, benefits and alternatives for the proposed anesthesia with the patient or authorized representative who has indicated his/her understanding and acceptance.    Dental advisory given  Plan Discussed with:   Anesthesia Plan Comments: (Risks of general anesthesia discussed including, but not limited to, sore throat, hoarse voice, chipped/damaged teeth, injury to vocal cords, nausea and vomiting, allergic reactions, lung infection, heart attack, stroke, and death. All questions answered. )       Anesthesia Quick Evaluation

## 2015-10-07 ENCOUNTER — Encounter (HOSPITAL_COMMUNITY)
Admission: RE | Disposition: A | Discharge status: Home / Self Care | Payer: Self-pay | Source: Ambulatory Visit | Attending: Obstetrics & Gynecology

## 2015-10-07 ENCOUNTER — Observation Stay (HOSPITAL_COMMUNITY)
Admission: RE | Admit: 2015-10-07 | Discharge: 2015-10-08 | Disposition: A | Discharge status: Home / Self Care | Payer: 59 | Source: Ambulatory Visit | Attending: Obstetrics & Gynecology | Admitting: Obstetrics & Gynecology

## 2015-10-07 ENCOUNTER — Encounter (HOSPITAL_COMMUNITY): Payer: Self-pay

## 2015-10-07 ENCOUNTER — Ambulatory Visit (HOSPITAL_COMMUNITY): Payer: 59 | Admitting: Anesthesiology

## 2015-10-07 DIAGNOSIS — K219 Gastro-esophageal reflux disease without esophagitis: Secondary | ICD-10-CM | POA: Diagnosis not present

## 2015-10-07 DIAGNOSIS — Z90710 Acquired absence of both cervix and uterus: Secondary | ICD-10-CM

## 2015-10-07 DIAGNOSIS — D251 Intramural leiomyoma of uterus: Secondary | ICD-10-CM | POA: Diagnosis not present

## 2015-10-07 DIAGNOSIS — I10 Essential (primary) hypertension: Secondary | ICD-10-CM | POA: Insufficient documentation

## 2015-10-07 DIAGNOSIS — N939 Abnormal uterine and vaginal bleeding, unspecified: Secondary | ICD-10-CM | POA: Diagnosis present

## 2015-10-07 DIAGNOSIS — D252 Subserosal leiomyoma of uterus: Secondary | ICD-10-CM | POA: Diagnosis not present

## 2015-10-07 HISTORY — PX: CYSTOSCOPY: SHX5120

## 2015-10-07 HISTORY — DX: Acquired absence of both cervix and uterus: Z90.710

## 2015-10-07 LAB — PREGNANCY, URINE: Preg Test, Ur: NEGATIVE

## 2015-10-07 SURGERY — CYSTOSCOPY
Anesthesia: General

## 2015-10-07 MED ORDER — DEXAMETHASONE SODIUM PHOSPHATE 10 MG/ML IJ SOLN
INTRAMUSCULAR | Status: DC | PRN
  Administered 2015-10-07: 4 mg via INTRAVENOUS

## 2015-10-07 MED ORDER — SUGAMMADEX SODIUM 200 MG/2ML IV SOLN
INTRAVENOUS | Status: DC | PRN
  Administered 2015-10-07: 126.2 mg via INTRAVENOUS

## 2015-10-07 MED ORDER — FENTANYL CITRATE (PF) 100 MCG/2ML IJ SOLN
INTRAMUSCULAR | Status: DC | PRN
  Administered 2015-10-07: 100 ug via INTRAVENOUS
  Administered 2015-10-07: 50 ug via INTRAVENOUS

## 2015-10-07 MED ORDER — ONDANSETRON HCL 4 MG/2ML IJ SOLN
INTRAMUSCULAR | Status: DC | PRN
  Administered 2015-10-07: 4 mg via INTRAVENOUS

## 2015-10-07 MED ORDER — SCOPOLAMINE 1 MG/3DAYS TD PT72
1.0000 | MEDICATED_PATCH | Freq: Once | TRANSDERMAL | Status: DC
  Administered 2015-10-07: 1.5 mg via TRANSDERMAL

## 2015-10-07 MED ORDER — FENTANYL CITRATE (PF) 100 MCG/2ML IJ SOLN
INTRAMUSCULAR | Status: AC
  Administered 2015-10-07: 25 ug via INTRAVENOUS
  Filled 2015-10-07: qty 2

## 2015-10-07 MED ORDER — MIDAZOLAM HCL 2 MG/2ML IJ SOLN
INTRAMUSCULAR | Status: DC | PRN
  Administered 2015-10-07: 2 mg via INTRAVENOUS

## 2015-10-07 MED ORDER — FENTANYL CITRATE (PF) 100 MCG/2ML IJ SOLN
INTRAMUSCULAR | Status: AC
  Administered 2015-10-07: 50 ug via INTRAVENOUS
  Filled 2015-10-07: qty 2

## 2015-10-07 MED ORDER — KETOROLAC TROMETHAMINE 30 MG/ML IJ SOLN: 30.0000 mg | Freq: Four times a day (QID) | INTRAMUSCULAR | Status: DC

## 2015-10-07 MED ORDER — ONDANSETRON HCL 4 MG PO TABS: 4.0000 mg | ORAL_TABLET | Freq: Four times a day (QID) | ORAL | Status: DC | PRN

## 2015-10-07 MED ORDER — ROCURONIUM BROMIDE 100 MG/10ML IV SOLN
INTRAVENOUS | Status: DC | PRN
  Administered 2015-10-07: 10 mg via INTRAVENOUS
  Administered 2015-10-07: 40 mg via INTRAVENOUS

## 2015-10-07 MED ORDER — OXYCODONE-ACETAMINOPHEN 5-325 MG PO TABS
1.0000 | ORAL_TABLET | ORAL | Status: DC | PRN
  Administered 2015-10-07: 1 via ORAL
  Filled 2015-10-07: qty 1

## 2015-10-07 MED ORDER — IBUPROFEN 800 MG PO TABS
800.0000 mg | ORAL_TABLET | Freq: Three times a day (TID) | ORAL | Status: DC | PRN
  Administered 2015-10-07 – 2015-10-08 (×2): 800 mg via ORAL
  Filled 2015-10-07 (×2): qty 1

## 2015-10-07 MED ORDER — FAMOTIDINE IN NACL 20-0.9 MG/50ML-% IV SOLN
20.0000 mg | Freq: Two times a day (BID) | INTRAVENOUS | Status: DC
  Administered 2015-10-07 (×2): 20 mg via INTRAVENOUS
  Filled 2015-10-07 (×6): qty 50

## 2015-10-07 MED ORDER — FENTANYL CITRATE (PF) 250 MCG/5ML IJ SOLN
INTRAMUSCULAR | Status: AC
  Filled 2015-10-07: qty 5

## 2015-10-07 MED ORDER — ONDANSETRON HCL 4 MG/2ML IJ SOLN
INTRAMUSCULAR | Status: AC
  Filled 2015-10-07: qty 2

## 2015-10-07 MED ORDER — LIDOCAINE HCL (CARDIAC) 20 MG/ML IV SOLN
INTRAVENOUS | Status: DC | PRN
  Administered 2015-10-07: 100 mg via INTRAVENOUS

## 2015-10-07 MED ORDER — PROPOFOL 10 MG/ML IV BOLUS
INTRAVENOUS | Status: DC | PRN
  Administered 2015-10-07: 180 mg via INTRAVENOUS

## 2015-10-07 MED ORDER — LACTATED RINGERS IV SOLN
INTRAVENOUS | Status: DC
  Administered 2015-10-07 – 2015-10-08 (×2): via INTRAVENOUS

## 2015-10-07 MED ORDER — LIDOCAINE HCL (CARDIAC) 20 MG/ML IV SOLN
INTRAVENOUS | Status: AC
  Filled 2015-10-07: qty 5

## 2015-10-07 MED ORDER — DEXAMETHASONE SODIUM PHOSPHATE 4 MG/ML IJ SOLN
INTRAMUSCULAR | Status: AC
  Filled 2015-10-07: qty 1

## 2015-10-07 MED ORDER — PHENYLEPHRINE 40 MCG/ML (10ML) SYRINGE FOR IV PUSH (FOR BLOOD PRESSURE SUPPORT)
PREFILLED_SYRINGE | INTRAVENOUS | Status: AC
  Filled 2015-10-07: qty 10

## 2015-10-07 MED ORDER — GLYCOPYRROLATE 0.2 MG/ML IJ SOLN
INTRAMUSCULAR | Status: DC | PRN
  Administered 2015-10-07: 0.2 mg via INTRAVENOUS

## 2015-10-07 MED ORDER — LACTATED RINGERS IV SOLN
INTRAVENOUS | Status: DC
  Administered 2015-10-07: 125 mL/h via INTRAVENOUS
  Administered 2015-10-07 (×3): via INTRAVENOUS

## 2015-10-07 MED ORDER — BUPIVACAINE HCL (PF) 0.25 % IJ SOLN
INTRAMUSCULAR | Status: AC
  Filled 2015-10-07: qty 30

## 2015-10-07 MED ORDER — ONDANSETRON HCL 4 MG/2ML IJ SOLN: 4.0000 mg | Freq: Four times a day (QID) | INTRAMUSCULAR | Status: DC | PRN

## 2015-10-07 MED ORDER — MIDAZOLAM HCL 2 MG/2ML IJ SOLN
INTRAMUSCULAR | Status: AC
  Filled 2015-10-07: qty 2

## 2015-10-07 MED ORDER — PROPOFOL 10 MG/ML IV BOLUS
INTRAVENOUS | Status: AC
  Filled 2015-10-07: qty 20

## 2015-10-07 MED ORDER — SCOPOLAMINE 1 MG/3DAYS TD PT72
MEDICATED_PATCH | TRANSDERMAL | Status: AC
  Filled 2015-10-07: qty 1

## 2015-10-07 MED ORDER — PHENYLEPHRINE HCL 10 MG/ML IJ SOLN
INTRAMUSCULAR | Status: DC | PRN
  Administered 2015-10-07 (×6): 40 ug via INTRAVENOUS

## 2015-10-07 MED ORDER — MENTHOL 3 MG MT LOZG
1.0000 | LOZENGE | OROMUCOSAL | Status: DC | PRN
  Filled 2015-10-07: qty 9

## 2015-10-07 MED ORDER — CEFAZOLIN SODIUM-DEXTROSE 2-3 GM-% IV SOLR
INTRAVENOUS | Status: DC | PRN
  Administered 2015-10-07: 2 g via INTRAVENOUS

## 2015-10-07 MED ORDER — BUPIVACAINE HCL (PF) 0.25 % IJ SOLN
INTRAMUSCULAR | Status: DC | PRN
  Administered 2015-10-07: 7 mL

## 2015-10-07 MED ORDER — SUGAMMADEX SODIUM 200 MG/2ML IV SOLN
INTRAVENOUS | Status: AC
  Filled 2015-10-07: qty 2

## 2015-10-07 MED ORDER — SIMETHICONE 80 MG PO CHEW
80.0000 mg | CHEWABLE_TABLET | Freq: Four times a day (QID) | ORAL | Status: DC
  Administered 2015-10-07 (×3): 80 mg via ORAL
  Filled 2015-10-07 (×11): qty 1

## 2015-10-07 MED ORDER — CEFAZOLIN SODIUM-DEXTROSE 2-4 GM/100ML-% IV SOLN
INTRAVENOUS | Status: AC
  Filled 2015-10-07: qty 100

## 2015-10-07 MED ORDER — LIDOCAINE-EPINEPHRINE 1 %-1:100000 IJ SOLN
INTRAMUSCULAR | Status: AC
  Filled 2015-10-07: qty 1

## 2015-10-07 MED ORDER — ROCURONIUM BROMIDE 100 MG/10ML IV SOLN
INTRAVENOUS | Status: AC
  Filled 2015-10-07: qty 1

## 2015-10-07 MED ORDER — GLYCOPYRROLATE 0.2 MG/ML IJ SOLN
INTRAMUSCULAR | Status: AC
  Filled 2015-10-07: qty 1

## 2015-10-07 MED ORDER — KETOROLAC TROMETHAMINE 30 MG/ML IJ SOLN
30.0000 mg | Freq: Four times a day (QID) | INTRAMUSCULAR | Status: DC
  Administered 2015-10-07: 30 mg via INTRAVENOUS
  Filled 2015-10-07 (×2): qty 1

## 2015-10-07 MED ORDER — FENTANYL CITRATE (PF) 100 MCG/2ML IJ SOLN
25.0000 ug | INTRAMUSCULAR | Status: DC | PRN
  Administered 2015-10-07: 50 ug via INTRAVENOUS
  Administered 2015-10-07: 25 ug via INTRAVENOUS
  Administered 2015-10-07: 50 ug via INTRAVENOUS
  Administered 2015-10-07: 25 ug via INTRAVENOUS

## 2015-10-07 MED ORDER — HYDROMORPHONE HCL 1 MG/ML IJ SOLN
0.5000 mg | INTRAMUSCULAR | Status: DC | PRN
  Administered 2015-10-07: 0.5 mg via INTRAVENOUS
  Filled 2015-10-07: qty 1

## 2015-10-07 SURGICAL SUPPLY — 48 items
CLOTH BEACON ORANGE TIMEOUT ST (SAFETY) ×4 IMPLANT
CORD ACTIVE DISPOSABLE (ELECTRODE) ×2
CORD ELECTRO ACTIVE DISP (ELECTRODE) ×2 IMPLANT
COVER BACK TABLE 60X90IN (DRAPES) ×4 IMPLANT
COVER LIGHT HANDLE  1/PK (MISCELLANEOUS) ×2
COVER LIGHT HANDLE 1/PK (MISCELLANEOUS) ×2 IMPLANT
DECANTER SPIKE VIAL GLASS SM (MISCELLANEOUS) ×8 IMPLANT
DRSG COVADERM PLUS 2X2 (GAUZE/BANDAGES/DRESSINGS) IMPLANT
DRSG OPSITE POSTOP 3X4 (GAUZE/BANDAGES/DRESSINGS) ×4 IMPLANT
DURAPREP 26ML APPLICATOR (WOUND CARE) ×4 IMPLANT
ELECT REM PT RETURN 9FT ADLT (ELECTROSURGICAL) ×4
ELECTRODE REM PT RTRN 9FT ADLT (ELECTROSURGICAL) ×2 IMPLANT
FILTER SMOKE EVAC LAPAROSHD (FILTER) ×4 IMPLANT
GLOVE BIO SURGEON STRL SZ 6.5 (GLOVE) ×6 IMPLANT
GLOVE BIO SURGEON STRL SZ7 (GLOVE) ×8 IMPLANT
GLOVE BIO SURGEONS STRL SZ 6.5 (GLOVE) ×2
GLOVE BIOGEL PI IND STRL 7.0 (GLOVE) ×6 IMPLANT
GLOVE BIOGEL PI INDICATOR 7.0 (GLOVE) ×6
LEGGING LITHOTOMY PAIR STRL (DRAPES) ×4 IMPLANT
LIGASURE BLUNT 5MM 37CM (INSTRUMENTS) ×4 IMPLANT
LIGASURE IMPACT 36 18CM CVD LR (INSTRUMENTS) IMPLANT
LIQUID BAND (GAUZE/BANDAGES/DRESSINGS) ×4 IMPLANT
MANIPULATOR VCARE STD CRV RETR (MISCELLANEOUS) ×4 IMPLANT
NS IRRIG 1000ML POUR BTL (IV SOLUTION) IMPLANT
PACK LAVH (CUSTOM PROCEDURE TRAY) ×4 IMPLANT
PACK ROBOTIC GOWN (GOWN DISPOSABLE) ×4 IMPLANT
PAD TRENDELENBURG POSITION (MISCELLANEOUS) ×4 IMPLANT
POUCH LAPAROSCOPIC INSTRUMENT (MISCELLANEOUS) ×4 IMPLANT
SCISSORS LAP 5X35 DISP (ENDOMECHANICALS) ×4 IMPLANT
SET CYSTO W/LG BORE CLAMP LF (SET/KITS/TRAYS/PACK) ×4 IMPLANT
SET IRRIG TUBING LAPAROSCOPIC (IRRIGATION / IRRIGATOR) IMPLANT
SLEEVE XCEL OPT CAN 5 100 (ENDOMECHANICALS) ×4 IMPLANT
SPONGE LAP 18X18 X RAY DECT (DISPOSABLE) ×4 IMPLANT
SUT MON AB 4-0 PS1 27 (SUTURE) ×4 IMPLANT
SUT VIC AB 0 CT1 27 (SUTURE) ×14
SUT VIC AB 0 CT1 27XBRD ANBCTR (SUTURE) ×4 IMPLANT
SUT VIC AB 0 CT1 27XCR 8 STRN (SUTURE) ×4 IMPLANT
SUT VIC AB 2-0 CT1 (SUTURE) ×8 IMPLANT
SUT VICRYL 0 TIES 12 18 (SUTURE) ×4 IMPLANT
SUT VICRYL 0 UR6 27IN ABS (SUTURE) ×4 IMPLANT
SYR BULB IRRIGATION 50ML (SYRINGE) ×4 IMPLANT
SYRINGE 60CC LL (MISCELLANEOUS) ×4 IMPLANT
TOWEL OR 17X24 6PK STRL BLUE (TOWEL DISPOSABLE) ×8 IMPLANT
TRAY FOLEY CATH SILVER 14FR (SET/KITS/TRAYS/PACK) ×4 IMPLANT
TROCAR OPTI TIP 5M 100M (ENDOMECHANICALS) ×4 IMPLANT
TROCAR XCEL NON-BLD 11X100MML (ENDOMECHANICALS) ×4 IMPLANT
TROCAR XCEL NON-BLD 5MMX100MML (ENDOMECHANICALS) ×4 IMPLANT
WARMER LAPAROSCOPE (MISCELLANEOUS) ×4 IMPLANT

## 2015-10-07 NOTE — Op Note (Addendum)
OPERATIVE NOTE  REVECCA Hall  DOB:    1971-01-11  MRN:    [id number]  CSN:    [id number]  Date of Surgery:  10/07/2015  Preoperative Diagnosis: Symptomatic fibroid uterus  Postoperative Diagnosis: Same as above  Procedure: Total Laparoscopic hysterectomy Bilateral salpingectomy Cystoscopy  Surgeon: Mady Haagensen. Alwyn Pea, M.D.  Assistant: Bobbye Charleston, M.D.  Anesthetic: GETA   EBL: 150 mL  IVF: 1800 mL  UOP: 100 mL clear urine  Complications: None  Disposition: The patient presents with the above-mentioned diagnosis. She understands the indications for surgical procedure.  She also understands the alternative treatment options. She accepts the risk of, but not limited to, anesthetic complications, bleeding, infections, and possible damage to the surrounding organs.  Indication: 45 year old AAF with abnormal uterine bleeding and bulk symptoms secondary to fibroid uterus. Patient failed conservative management with hormone therapy.   Findings: Exam under anesthesia: external vulva: normal vaginal vault: normal cervix: grossly normal uterus enlarged mobile midline +palpable fibroids  Laparoscopic findings: enlarged fibroid uterus normal appearing tubes and ovaries bilaterally.  Cystoscopy: normal bladder urothelium no injury; brisk jets from bilateral ureteral orifice  Procedure: The patient was brought into the operating room with an intravenous line in placed, and anesthetic was administered. She was placed in a low anterior lithotomy position using Allen stirrups. The vaginal portion of the procedure included placement of a V-Care uterine manipulator.  The laparoscopic port sites were anesthetized with intradermal injection of 0.25% Marcaine. There were 4 ports placed, a [id number] umbilical port,and 5-mm right and 2 82mm- left lower quadrant ports. A 1cm subumbilical incision was made with a 15 blade scalpel and the 28mm 0 degree laparoscope attached to the Excel visiport was  used to enter the abdomen directly under direct video visualization. The abdominal entry was confirmed with the laparoscope and pneumoperitoneum was insufflated without difficulty. The patient was placed in steep Trendelenburg.  The 5-mm accessory ports was then placed in the left and right lower quadrants under direct laparoscopic guidance . There was no noted injury with placement of any of the trocars.   The ureter was visualized coursing well under the operative site. The right mesosalpinx was cauterized using 54mm Ligasure and the tube excised off the ovary. The uteroovarian ligament was then cauterized and ligated using the Ligasure. The right round ligament was then cauterized and transected via the Ligasure. The anterior broad ligament was then isolated and the the mesovarium was skeletonized. A bladder flap was mobilized by the endoshears attached to monopolar cautery and the peritoneum on the vesicouterine fold was incised to mobilize the bladder. The above was repeated for the contralateral side. There was hemostasis noted at the uteroovarian and round ligament pedicles. Once the V-Care colpotomy ring was skeletonized and in position, the uterine arteries were sealed and transected using the Ligasure at the level of the colpotomy ring. The vagina was transected using the monopolar endoshears resulting in separation of the uterus and attached tubes and ovaries. The uterus,and fallopian tubes were then delivered through the vagina. The vaginal vault was closed vaginally. A modified McCalls colposuspension was performed and the vagina was closed with running locked suture of 0-vicryl.    A cystoscopy was then performed using a 70 degree cystoscopy and a complete bladder survey was performed. There was no noted injury to the bladder urothelium and there were brisk jets from both ureteral orifices.   Surgeons gowns and gloves were changed and attention was returned to the abdomen.  Pneumoperitoneum was  obtained again and the pelvis was examined and excellent hemostasis noted. The abdomen and pelvis was irrigated thoroughly.  The insufflation pressure was reduced, and no evidence of bleeding was seen. The accessory ports were removed under direct laparoscopic guidance and  the pneumoperitoneum was released. The umbilical port was removed using laparoscopic guidance. The umbilical fascia was closed with an interrupted figure-of-eight stitch using 2-0 Vicryl on a UR-6. The skin was closed with subcuticular stitches using 4-0 Monocryl suture. Dermabond was used to cover each incision site and op sites placed.  The final sponge, needle, and instrument counts were correct at the completion of the procedure. The patient tolerated the procedure well and was awakened and taken to the post anesthesia care unit in stable condition.   Mylon Mabey STACIA

## 2015-10-07 NOTE — Anesthesia Postprocedure Evaluation (Signed)
Anesthesia Post Note  Patient: Lauren Hall  Procedure(s) Performed: Procedure(s) (LRB): CYSTOSCOPY (N/A) TOTAL LAPAROSCOPIC HYSTERECTOMY WITH SALPINGECTOMY  Patient location during evaluation: PACU Anesthesia Type: General Level of consciousness: awake and alert Pain management: pain level controlled Vital Signs Assessment: post-procedure vital signs reviewed and stable Respiratory status: spontaneous breathing, nonlabored ventilation and respiratory function stable Cardiovascular status: blood pressure returned to baseline and stable Postop Assessment: no signs of nausea or vomiting Anesthetic complications: no     Last Vitals:  Vitals:   10/07/15 1238 10/07/15 1320  BP: 120/79 123/78  Pulse: 73 77  Resp: 16 16  Temp: 36.9 C 36.7 C    Last Pain:  Vitals:   10/07/15 1412  TempSrc:   PainSc: 4    Pain Goal: Patients Stated Pain Goal: 3 (10/07/15 1338)               Nilda Simmer

## 2015-10-07 NOTE — Transfer of Care (Signed)
Immediate Anesthesia Transfer of Care Note  Patient: Lauren Hall  Procedure(s) Performed: Procedure(s): CYSTOSCOPY (N/A) TOTAL LAPAROSCOPIC HYSTERECTOMY WITH SALPINGECTOMY  Patient Location: PACU  Anesthesia Type:General  Level of Consciousness: awake, alert  and oriented  Airway & Oxygen Therapy: Patient Spontanous Breathing and Patient connected to nasal cannula oxygen  Post-op Assessment: Report given to RN, Post -op Vital signs reviewed and stable and Patient moving all extremities  Post vital signs: Reviewed and stable  Last Vitals:  Vitals:   10/07/15 0626  BP: (!) 150/79  Pulse: 85  Resp: 20  Temp: 36.7 C    Last Pain:  Vitals:   10/07/15 0626  TempSrc: Oral      Patients Stated Pain Goal: 3 ([id number])  Complications: No apparent anesthesia complications

## 2015-10-07 NOTE — H&P (Signed)
Lauren Hall is an 45 y.o. female with abnormal uterine bleeding, bulk symptoms.   Pertinent Gynecological History: Menses: flow is excessive with use of 6 pads or tampons on heaviest days Bleeding: dysfunctional uterine bleeding Contraception: none DES exposure: denies Blood transfusions: none Sexually transmitted diseases: no past history Previous GYN Procedures: DNC  Last mammogram: normal Date: 2017 Last pap: normal Date: 2017 OB History: G1, P1   Menstrual History: Menarche age: 29 Patient's last menstrual period was 09/02/2015 (approximate).    Past Medical History:  Diagnosis Date  . Allergy   . Anemia   . Anxiety   . Bronchitis 07/27/2011  . 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 Complication of anesthesia    patient woke up while they were sewing up her incision with breast lumpectomy  . Depression   . Dysrhythmia    Tachycardia  . Enlarged thyroid   . Fatigue 10/08/2010  . Fibromyalgia 2009  . Fibromyalgia   . GERD (gastroesophageal reflux disease) 09/26/2013  . Headache(784.0) 10/08/2010   Migraines  . Hip pain, bilateral 11/06/2010  . Hyperglycemia 08/11/2015   normal Hgb A 1C per patient  . Hypertension   . Low back pain radiating to right leg 03/11/2015  . Other and unspecified hyperlipidemia 07/29/2012  . Peripheral neuropathy (HCC) 11/11/2012   burning sensation mainly right leg  . Poor concentration 01/11/2011  . Preventative health care 07/29/2012  . Sinusitis, acute 07/27/2011  . Thyroid disease 10/08/2010  . Tick bite of flank 11/09/2011  . Tinea corporis 11/11/2012    Past Surgical History:  Procedure Laterality Date  . BREAST LUMPECTOMY  1999   right- benign    Family History  Problem Relation Age of Onset  . Fibromyalgia Mother   . Diabetes Mother     type 2  . Hypertension Mother   . Hyperlipidemia Mother   . Alcohol abuse Father   . Cancer Father     colon  . Hypertension Brother   . Alcohol abuse Brother   . Heart disease Maternal  Grandmother   . Hyperlipidemia Maternal Grandmother   . Diabetes Maternal Grandmother     type 2  . Cancer Maternal Grandmother     colon  . Stroke Maternal Grandfather   . Diabetes Maternal Grandfather     type 2  . Cancer Paternal Grandmother     ovarian  . Alzheimer's disease Paternal Grandfather     Social History:  reports that she has never smoked. She has never used smokeless tobacco. She reports that she drinks alcohol. She reports that she does not use drugs.  Allergies:  Allergies  Allergen Reactions  . Promethazine Anaphylaxis    Prescriptions Prior to Admission  Medication Sig Dispense Refill Last Dose  . B Complex-C (B-COMPLEX WITH VITAMIN C) tablet Take 1 tablet by mouth daily.   Past Week at Unknown time  . fish oil-omega-3 fatty acids 1000 MG capsule MegaRed 1 cap by mouth daily (by Schiff) (Patient taking differently: Take 1 capsule by mouth daily. MegaRed) 30 capsule 1 10/06/2015 at Unknown time  . gabapentin (NEURONTIN) 300 MG capsule TAKE ONE CAPSULE BY MOUTH TWICE A DAY AND 2 CAPSULES AT BEDTIME (Patient taking differently: Take 300-600 mg by mouth 2 (two) times daily. TAKE ONE CAPSULE BY MOUTH TWICE A DAY AND 2 CAPSULES AT BEDTIME) 360 capsule 0 10/07/2015 at 0430  . lisinopril (PRINIVIL,ZESTRIL) 10 MG tablet Take 1 tablet (10 mg total) by mouth 2 (two) times daily. 180 tablet 0 10/07/2015  at 0430  . LORazepam (ATIVAN) 0.5 MG tablet Take 1 tablet (0.5 mg total) by mouth 2 (two) times daily as needed. 40 tablet 1 Past Week at Unknown time  . methocarbamol (ROBAXIN) 500 MG tablet Take 1 tablet (500 mg total) by mouth 2 (two) times daily as needed for muscle spasms. 60 tablet 1 Past Week at Unknown time  . nebivolol (BYSTOLIC) 5 MG tablet Take 1 tablet (5 mg total) by mouth daily. 90 tablet 1 10/07/2015 at 0430  . omeprazole (PRILOSEC) 40 MG capsule TAKE 1 CAPSULE BY MOUTH EVERY DAY 90 capsule 0 10/07/2015 at 0430  . ranitidine (ZANTAC) 300 MG tablet Take 1 tablet (300 mg  total) by mouth at bedtime. 90 tablet 1 Past Week at Unknown time  . venlafaxine XR (EFFEXOR-XR) 37.5 MG 24 hr capsule TAKE 1 CAPSULE(37.5 MG) BY MOUTH DAILY WITH BREAKFAST 30 capsule 3 10/07/2015 at 0430  . cyclobenzaprine (FLEXERIL) 5 MG tablet Take 5 mg by mouth 3 (three) times daily as needed for muscle spasms.   1 More than a month at Unknown time  . lidocaine (LIDODERM) 5 % Place 2 patches onto the skin daily. Remove & Discard patch within 12 hours or as directed by MD (Patient taking differently: Place 2 patches onto the skin daily as needed (for pain). Remove & Discard patch within 12 hours or as directed by MD) 60 patch 1 More than a month at Unknown time  . ondansetron (ZOFRAN) 4 MG tablet Take 1 tablet (4 mg total) by mouth every 8 (eight) hours as needed for nausea or vomiting. 20 tablet 1 More than a month at Unknown time    Review of Systems  Constitutional: Negative.   Eyes: Negative.   Cardiovascular: Negative.   Gastrointestinal: Negative.   Genitourinary: Negative.   All other systems reviewed and are negative.   Blood pressure (!) 150/79, pulse 85, temperature 98.1 F (36.7 C), temperature source Oral, resp. rate 20, last menstrual period 09/02/2015, SpO2 100 %. Physical Exam  Vitals reviewed. Constitutional: She is oriented to person, place, and time.  Genitourinary:  Genitourinary Comments: Normal vagina, enlarged bulky uterus +_fibroids  Neurological: She is alert and oriented to person, place, and time.    Results for orders placed or performed during the hospital encounter of 10/07/15 (from the past 24 hour(s))  Pregnancy, urine     Status: None   Collection Time: 10/07/15  6:00 AM  Result Value Ref Range   Preg Test, Ur NEGATIVE NEGATIVE    No results found.  Assessment/Plan: Patient with bulk symptoms, fibroid uterus US c/w uterine fibroids. Treatment options discussed with patient to include no intervention, use of OCP's, Depo Lupron/Depo Provera, Mirena  IUD, myomectomy, endometrial ablation, [place] and hysterectomy. EMBX negative for hyperplasia or malignancy  Pt desires definitive surgical intervention. The R/B of hysterectomy were discussed w/ the pt. to include, but not limited to bleeding/transfusion, infection, wound breakdown/poor healing, damage to organs in abd/pelvis w/ possible need for further surgical repair, need for abdominal incision to complete procedure, blood clot, PE/MI/stroke, post op prolapse, worsening of UI sx, possible need for [facility] or further surgery for repair; pt understands these risks and consents to surgery. Additionally, risks/benefits of ovarian preservation discussed to include 1/70 lifetime risk of ovarian cancer and 5-10% risk for future surgery after hysterectomy. PT desires preservation of her ovaries.   To OR for LAVH / bilateral salpingectomy / cystoscopy WP   Daryan Buell STACIA 10/07/2015, 7:31 AM

## 2015-10-07 NOTE — Anesthesia Postprocedure Evaluation (Signed)
Anesthesia Post Note  Patient: Lauren Hall  Procedure(s) Performed: Procedure(s) (LRB): CYSTOSCOPY (N/A) TOTAL LAPAROSCOPIC HYSTERECTOMY WITH SALPINGECTOMY  Patient location during evaluation: [facility] Anesthesia Type: General Level of consciousness: awake and alert Pain management: pain level controlled Vital Signs Assessment: post-procedure vital signs reviewed and stable Respiratory status: spontaneous breathing Cardiovascular status: blood pressure returned to baseline and stable Postop Assessment: no signs of nausea or vomiting and adequate PO intake     Last Vitals:  Vitals:   10/07/15 1238 10/07/15 1320  BP: 120/79 123/78  Pulse: 73 77  Resp: 16 16  Temp: 36.9 C 36.7 C    Last Pain:  Vitals:   10/07/15 1412  TempSrc:   PainSc: 4    Pain Goal: Patients Stated Pain Goal: 3 (10/07/15 1338)               Birdena Crandall, Velvet Bathe

## 2015-10-07 NOTE — Anesthesia Procedure Notes (Signed)
Procedure Name: Intubation Date/Time: 10/07/2015 7:37 AM Performed by: Hewitt Blade Pre-anesthesia Checklist: Patient identified, Emergency Drugs available, Suction available and Patient being monitored Patient Re-evaluated:Patient Re-evaluated prior to inductionOxygen Delivery Method: Circle system utilized Preoxygenation: Pre-oxygenation with 100% oxygen Intubation Type: IV induction Ventilation: Mask ventilation without difficulty and Oral airway inserted - appropriate to patient size Laryngoscope Size: Mac and 3 Grade View: Grade II Tube type: Oral Tube size: 7.0 mm Number of attempts: 1 Airway Equipment and Method: Stylet Placement Confirmation: ETT inserted through vocal cords under direct vision,  positive ETCO2 and breath sounds checked- equal and bilateral Secured at: 22 cm Tube secured with: Tape Dental Injury: Teeth and Oropharynx as per pre-operative assessment

## 2015-10-08 ENCOUNTER — Encounter: Payer: Self-pay | Admitting: Obstetrics & Gynecology

## 2015-10-08 ENCOUNTER — Encounter (HOSPITAL_COMMUNITY): Payer: Self-pay | Admitting: Obstetrics & Gynecology

## 2015-10-08 DIAGNOSIS — D252 Subserosal leiomyoma of uterus: Secondary | ICD-10-CM | POA: Diagnosis not present

## 2015-10-08 LAB — HEMOGLOBIN: Hemoglobin: 10 g/dL — ABNORMAL LOW (ref 12.0–15.0)

## 2015-10-08 MED ORDER — OXYCODONE-ACETAMINOPHEN 5-325 MG PO TABS: 1.0000 | ORAL_TABLET | ORAL | 0 refills | Status: DC | PRN

## 2015-10-08 MED ORDER — IBUPROFEN 800 MG PO TABS: 800.0000 mg | ORAL_TABLET | Freq: Three times a day (TID) | ORAL | 2 refills | Status: DC | PRN

## 2015-10-08 NOTE — Patient Instructions (Signed)
Beta blocker not started during hospital stay as her BP was low to normal and patient to start within 24 hrs of having surgery  Tonyia Marschall STACIA

## 2015-10-08 NOTE — Progress Notes (Addendum)
Patient discharged home with family. Discharge instructions and paperwork reviewed. Prescriptions given to patient. No questions at this time.

## 2015-10-08 NOTE — Discharge Summary (Signed)
Physician Discharge Summary  Patient ID: Lauren Hall MRN: [id number] DOB/AGE: Jan 21, 1971 45 y.o.  Admit date: 10/07/2015 Discharge date: 10/08/2015  Admission Diagnoses: Symptomatic fibroid uterus; abnormal uterine bleeding  Discharge Diagnoses:  Active Problems:   S/P laparoscopic hysterectomy   Discharged Condition: good  Hospital Course: Patient admitted, taken to OR where below procedures were performed. There were no intraoperative complications. Post operatively, the patient did well was meeting discharge criteria so was discharged on post op day #1  Consults: None  Significant Diagnostic Studies: labs: post op Hb: 10  Treatments: IV hydration and surgery: TLH, bilateral salpingectomy, cystoscopy  Discharge Exam: Blood pressure 122/83, pulse 78, temperature 98.8 F (37.1 C), temperature source Oral, resp. rate 18, height 4\' 11"  (1.499 m), weight 63 kg (139 lb), last menstrual period 09/02/2015, SpO2 97 %. General appearance: alert, cooperative and no distress GI: soft, non-tender; bowel sounds normal; no masses,  no organomegaly Pelvic: no vaginal bleeding Extremities: extremities normal, atraumatic, no cyanosis or edema and Homans sign is negative, no sign of DVT Incision/Wound: Clean dry intact, no erythema, induration   Disposition: 01-Home or Self Care  Discharge Instructions     Remove dressing in 72 hours    Complete by:  As directed   Call MD for:    Complete by:  As directed   Call for your post op and if you have any questions or concerns. Call if you have any abnormal foul smelling vaginal discharge or heavy vaginal bleeding   Call MD for:  difficulty breathing, headache or visual disturbances    Complete by:  As directed   Call MD for:  hives    Complete by:  As directed   Call MD for:  persistant dizziness or light-headedness    Complete by:  As directed   Call MD for:  persistant nausea and vomiting    Complete by:  As directed   Call MD for:  redness,  tenderness, or signs of infection (pain, swelling, redness, odor or green/yellow discharge around incision site)    Complete by:  As directed   Call MD for:  severe uncontrolled pain    Complete by:  As directed   Call MD for:  temperature >100.4    Complete by:  As directed   Diet - low sodium heart healthy    Complete by:  As directed   Discharge wound care:    Complete by:  As directed   Don't use savs or lotions   Driving Restrictions    Complete by:  As directed   Don't drive for 10 to 14 days or while taking percocet   Increase activity slowly    Complete by:  As directed   Lifting restrictions    Complete by:  As directed   Don't lift anything heavier than 20 pounds   Sexual Activity Restrictions    Complete by:  As directed   No intercourse or anything in vagina for 4-6 weeks or until seen at post partum visit.       Medication List    TAKE these medications   B-complex with vitamin C tablet Take 1 tablet by mouth daily.   cyclobenzaprine 5 MG tablet Commonly known as:  FLEXERIL Take 5 mg by mouth 3 (three) times daily as needed for muscle spasms.   fish oil-omega-3 fatty acids 1000 MG capsule MegaRed 1 cap by mouth daily (by Schiff) What changed:  how much to take  how to take this  when  to take this  additional instructions   gabapentin 300 MG capsule Commonly known as:  NEURONTIN TAKE ONE CAPSULE BY MOUTH TWICE A DAY AND 2 CAPSULES AT BEDTIME What changed:  how much to take  how to take this  when to take this  additional instructions   ibuprofen 800 MG tablet Commonly known as:  ADVIL,MOTRIN Take 1 tablet (800 mg total) by mouth every 8 (eight) hours as needed (mild pain).   lidocaine 5 % Commonly known as:  LIDODERM Place 2 patches onto the skin daily. Remove & Discard patch within 12 hours or as directed by MD What changed:  when to take this  reasons to take this  additional instructions   lisinopril 10 MG tablet Commonly known as:   PRINIVIL,ZESTRIL Take 1 tablet (10 mg total) by mouth 2 (two) times daily.   LORazepam 0.5 MG tablet Commonly known as:  ATIVAN Take 1 tablet (0.5 mg total) by mouth 2 (two) times daily as needed.   methocarbamol 500 MG tablet Commonly known as:  ROBAXIN Take 1 tablet (500 mg total) by mouth 2 (two) times daily as needed for muscle spasms.   nebivolol 5 MG tablet Commonly known as:  BYSTOLIC Take 1 tablet (5 mg total) by mouth daily.   omeprazole 40 MG capsule Commonly known as:  PRILOSEC TAKE 1 CAPSULE BY MOUTH EVERY DAY   ondansetron 4 MG tablet Commonly known as:  ZOFRAN Take 1 tablet (4 mg total) by mouth every 8 (eight) hours as needed for nausea or vomiting.   oxyCODONE-acetaminophen 5-325 MG tablet Commonly known as:  PERCOCET/ROXICET Take 1-2 tablets by mouth every 4 (four) hours as needed (moderate to severe pain (when tolerating fluids)).   ranitidine 300 MG tablet Commonly known as:  ZANTAC Take 1 tablet (300 mg total) by mouth at bedtime.   venlafaxine XR 37.5 MG 24 hr capsule Commonly known as:  EFFEXOR-XR TAKE 1 CAPSULE(37.5 MG) BY MOUTH DAILY WITH BREAKFAST        Signed: Sanjuana Kava STACIA 10/08/2015, 7:52 AM

## 2015-10-08 NOTE — Progress Notes (Addendum)
Dr. Tyler Aas office contacted to clarify beta blocker orders. Waiting for return call. Left message with office staff regarding the need for clarification in the medical record of the patient's beta blocker orders.

## 2015-10-08 NOTE — Progress Notes (Signed)
1 Day Post-Op Procedure(s) (LRB): CYSTOSCOPY (N/A) TOTAL LAPAROSCOPIC HYSTERECTOMY WITH SALPINGECTOMY  Subjective: Patient reports incisional pain, tolerating PO, + flatus and no problems voiding.    Objective: I have reviewed patient's vital signs, intake and output, medications and labs.  General: alert, cooperative and no distress GI: soft, non-tender; bowel sounds normal; no masses,  no organomegaly and incision: clean, dry and intact Extremities: extremities normal, atraumatic, no cyanosis or edema and Homans sign is negative, no sign of DVT Vaginal Bleeding: minimal  Assessment: s/p Procedure(s): CYSTOSCOPY (N/A) TOTAL LAPAROSCOPIC HYSTERECTOMY WITH SALPINGECTOMY: progressing well  Plan: Advance diet Encourage ambulation Discontinue IV fluids Discharge home  LOS: 0 days    [place], [place] 10/08/2015, 7:45 AM

## 2015-10-09 ENCOUNTER — Other Ambulatory Visit: Payer: Self-pay | Admitting: Family Medicine

## 2015-11-03 ENCOUNTER — Other Ambulatory Visit: Payer: Self-pay | Admitting: Family Medicine

## 2015-11-05 ENCOUNTER — Other Ambulatory Visit: Payer: Self-pay | Admitting: Obstetrics & Gynecology

## 2015-11-11 ENCOUNTER — Ambulatory Visit: Payer: 59 | Admitting: Gastroenterology

## 2015-11-30 ENCOUNTER — Other Ambulatory Visit: Payer: Self-pay | Admitting: Family Medicine

## 2015-12-02 ENCOUNTER — Ambulatory Visit: Payer: 59 | Admitting: Gastroenterology

## 2015-12-08 ENCOUNTER — Encounter: Payer: Self-pay | Admitting: Family Medicine

## 2015-12-08 ENCOUNTER — Ambulatory Visit (INDEPENDENT_AMBULATORY_CARE_PROVIDER_SITE_OTHER): Payer: 59 | Admitting: Family Medicine

## 2015-12-08 VITALS — BP 102/68 | HR 98 | Temp 98.4°F | Ht 59.0 in | Wt 140.0 lb

## 2015-12-08 DIAGNOSIS — R000 Tachycardia, unspecified: Secondary | ICD-10-CM

## 2015-12-08 DIAGNOSIS — E782 Mixed hyperlipidemia: Secondary | ICD-10-CM

## 2015-12-08 DIAGNOSIS — R5383 Other fatigue: Secondary | ICD-10-CM

## 2015-12-08 DIAGNOSIS — M797 Fibromyalgia: Secondary | ICD-10-CM

## 2015-12-08 DIAGNOSIS — E039 Hypothyroidism, unspecified: Secondary | ICD-10-CM

## 2015-12-08 DIAGNOSIS — R739 Hyperglycemia, unspecified: Secondary | ICD-10-CM

## 2015-12-08 DIAGNOSIS — Z0000 Encounter for general adult medical examination without abnormal findings: Secondary | ICD-10-CM

## 2015-12-08 DIAGNOSIS — F329 Major depressive disorder, single episode, unspecified: Secondary | ICD-10-CM

## 2015-12-08 DIAGNOSIS — Z23 Encounter for immunization: Secondary | ICD-10-CM

## 2015-12-08 DIAGNOSIS — I10 Essential (primary) hypertension: Secondary | ICD-10-CM | POA: Diagnosis not present

## 2015-12-08 DIAGNOSIS — F32A Depression, unspecified: Secondary | ICD-10-CM

## 2015-12-08 LAB — COMPREHENSIVE METABOLIC PANEL
ALBUMIN: 4.5 g/dL (ref 3.5–5.2)
ALK PHOS: 81 U/L (ref 39–117)
ALT: 14 U/L (ref 0–35)
AST: 16 U/L (ref 0–37)
BUN: 8 mg/dL (ref 6–23)
CALCIUM: 9.7 mg/dL (ref 8.4–10.5)
CHLORIDE: 103 meq/L (ref 96–112)
CO2: 31 mEq/L (ref 19–32)
CREATININE: 0.8 mg/dL (ref 0.40–1.20)
GFR: 98.86 mL/min (ref >60.00)
Glucose, Bld: 81 mg/dL (ref 70–99)
Potassium: 3.6 mEq/L (ref 3.5–5.1)
Sodium: 140 mEq/L (ref 135–145)
TOTAL PROTEIN: 7.3 g/dL (ref 6.0–8.3)
Total Bilirubin: 0.3 mg/dL (ref 0.2–1.2)

## 2015-12-08 LAB — LIPID PANEL
CHOL/HDL RATIO: 3
Cholesterol: 257 mg/dL — ABNORMAL HIGH (ref 0–200)
HDL: 76 mg/dL (ref >39.00)
LDL CALC: 146 mg/dL — AB (ref 0–99)
NonHDL: 180.82
TRIGLYCERIDES: 174 mg/dL — AB (ref 0.0–149.0)
VLDL: 34.8 mg/dL (ref 0.0–40.0)

## 2015-12-08 LAB — CBC
HCT: 37.2 % (ref 36.0–46.0)
Hemoglobin: 12.5 g/dL (ref 12.0–15.0)
MCHC: 33.6 g/dL (ref 30.0–36.0)
MCV: 88.1 fl (ref 78.0–100.0)
PLATELETS: 308 10*3/uL (ref 150.0–400.0)
RBC: 4.22 Mil/uL (ref 3.87–5.11)
RDW: 15.2 % (ref 11.5–15.5)
WBC: 5.8 10*3/uL (ref 4.0–10.5)

## 2015-12-08 LAB — TSH: TSH: 0.24 u[IU]/mL — AB (ref 0.35–4.50)

## 2015-12-08 LAB — HEMOGLOBIN A1C: Hgb A1c MFr Bld: 5.4 % (ref 4.6–6.5)

## 2015-12-08 MED ORDER — VENLAFAXINE HCL ER 75 MG PO CP24: 75.0000 mg | ORAL_CAPSULE | Freq: Every day | ORAL | 5 refills | Status: DC

## 2015-12-08 MED ORDER — METHOCARBAMOL 500 MG PO TABS
500.0000 mg | ORAL_TABLET | Freq: Two times a day (BID) | ORAL | 5 refills | Status: DC | PRN
Start: 2015-12-08 — End: 2016-07-17

## 2015-12-08 MED ORDER — NEBIVOLOL HCL 5 MG PO TABS: 5.0000 mg | ORAL_TABLET | Freq: Every day | ORAL | 1 refills | Status: DC

## 2015-12-08 MED ORDER — LIDOCAINE 5 % EX PTCH: 2.0000 | MEDICATED_PATCH | CUTANEOUS | 1 refills | Status: DC

## 2015-12-08 MED ORDER — LISINOPRIL 10 MG PO TABS: 10.0000 mg | ORAL_TABLET | Freq: Two times a day (BID) | ORAL | 1 refills | Status: DC

## 2015-12-08 MED ORDER — METHYLPHENIDATE HCL 10 MG PO TABS: 5.0000 mg | ORAL_TABLET | Freq: Every day | ORAL | 0 refills | Status: DC | PRN

## 2015-12-08 MED ORDER — LORAZEPAM 0.5 MG PO TABS: 0.5000 mg | ORAL_TABLET | Freq: Two times a day (BID) | ORAL | 5 refills | Status: DC | PRN

## 2015-12-08 MED ORDER — GABAPENTIN 300 MG PO CAPS: ORAL_CAPSULE | ORAL | 1 refills | Status: DC

## 2015-12-08 NOTE — Assessment & Plan Note (Signed)
Worsening pain, refill given on Gabapentin and Venlafaxine increased

## 2015-12-08 NOTE — Progress Notes (Signed)
Pre visit review using our clinic review tool, if applicable. No additional management support is needed unless otherwise documented below in the visit note. 

## 2015-12-08 NOTE — Assessment & Plan Note (Signed)
Struggling with tearfulness and worsening fatigue, anhedonia, anxiety and worsening depression. Increase Venlafaxine R 75 mg daily

## 2015-12-08 NOTE — Patient Instructions (Signed)
Preventive Care for Adults, Female A healthy lifestyle and preventive care can promote health and wellness. Preventive health guidelines for women include the following key practices.  A routine yearly physical is a good way to check with your health care provider about your health and preventive screening. It is a chance to share any concerns and updates on your health and to receive a thorough exam.  Visit your dentist for a routine exam and preventive care every 6 months. Brush your teeth twice a day and floss once a day. Good oral hygiene prevents tooth decay and gum disease.  The frequency of eye exams is based on your age, health, family medical history, use of contact lenses, and other factors. Follow your health care provider's recommendations for frequency of eye exams.  Eat a healthy diet. Foods like vegetables, fruits, whole grains, low-fat dairy products, and lean protein foods contain the nutrients you need without too many calories. Decrease your intake of foods high in solid fats, added sugars, and salt. Eat the right amount of calories for you.Get information about a proper diet from your health care provider, if necessary.  Regular physical exercise is one of the most important things you can do for your health. Most adults should get at least 150 minutes of moderate-intensity exercise (any activity that increases your heart rate and causes you to sweat) each week. In addition, most adults need muscle-strengthening exercises on 2 or more days a week.  Maintain a healthy weight. The body mass index (BMI) is a screening tool to identify possible weight problems. It provides an estimate of body fat based on height and weight. Your health care provider can find your BMI and can help you achieve or maintain a healthy weight.For adults 20 years and older:  A BMI below 18.5 is considered underweight.  A BMI of 18.5 to 24.9 is normal.  A BMI of 25 to 29.9 is considered overweight.  A  BMI of 30 and above is considered obese.  Maintain normal blood lipids and cholesterol levels by exercising and minimizing your intake of saturated fat. Eat a balanced diet with plenty of fruit and vegetables. Blood tests for lipids and cholesterol should begin at age 45 and be repeated every 5 years. If your lipid or cholesterol levels are high, you are over 50, or you are at high risk for heart disease, you may need your cholesterol levels checked more frequently.Ongoing high lipid and cholesterol levels should be treated with medicines if diet and exercise are not working.  If you smoke, find out from your health care provider how to quit. If you do not use tobacco, do not start.  Lung cancer screening is recommended for adults aged 45-80 years who are at high risk for developing lung cancer because of a history of smoking. A yearly low-dose CT scan of the lungs is recommended for people who have at least a 30-pack-year history of smoking and are a current smoker or have quit within the past 15 years. A pack year of smoking is smoking an average of 1 pack of cigarettes a day for 1 year (for example: 1 pack a day for 30 years or 2 packs a day for 15 years). Yearly screening should continue until the smoker has stopped smoking for at least 15 years. Yearly screening should be stopped for people who develop a health problem that would prevent them from having lung cancer treatment.  If you are pregnant, do not drink alcohol. If you are  breastfeeding, be very cautious about drinking alcohol. If you are not pregnant and choose to drink alcohol, do not have more than 1 drink per day. One drink is considered to be 12 ounces (355 mL) of beer, 5 ounces (148 mL) of wine, or 1.5 ounces (44 mL) of liquor.  Avoid use of street drugs. Do not share needles with anyone. Ask for help if you need support or instructions about stopping the use of drugs.  High blood pressure causes heart disease and increases the risk  of stroke. Your blood pressure should be checked at least every 1 to 2 years. Ongoing high blood pressure should be treated with medicines if weight loss and exercise do not work.  If you are 55-79 years old, ask your health care provider if you should take aspirin to prevent strokes.  Diabetes screening is done by taking a blood sample to check your blood glucose level after you have not eaten for a certain period of time (fasting). If you are not overweight and you do not have risk factors for diabetes, you should be screened once every 3 years starting at age 45. If you are overweight or obese and you are 40-70 years of age, you should be screened for diabetes every year as part of your cardiovascular risk assessment.  Breast cancer screening is essential preventive care for women. You should practice "breast self-awareness." This means understanding the normal appearance and feel of your breasts and may include breast self-examination. Any changes detected, no matter how small, should be reported to a health care provider. Women in their 20s and 30s should have a clinical breast exam (CBE) by a health care provider as part of a regular health exam every 1 to 3 years. After age 40, women should have a CBE every year. Starting at age 40, women should consider having a mammogram (breast X-ray test) every year. Women who have a family history of breast cancer should talk to their health care provider about genetic screening. Women at a high risk of breast cancer should talk to their health care providers about having an MRI and a mammogram every year.  Breast cancer gene (BRCA)-related cancer risk assessment is recommended for women who have family members with BRCA-related cancers. BRCA-related cancers include breast, ovarian, tubal, and peritoneal cancers. Having family members with these cancers may be associated with an increased risk for harmful changes (mutations) in the breast cancer genes BRCA1 and  BRCA2. Results of the assessment will determine the need for genetic counseling and BRCA1 and BRCA2 testing.  Your health care provider may recommend that you be screened regularly for cancer of the pelvic organs (ovaries, uterus, and vagina). This screening involves a pelvic examination, including checking for microscopic changes to the surface of your cervix (Pap test). You may be encouraged to have this screening done every 3 years, beginning at age 21.  For women ages 30-65, health care providers may recommend pelvic exams and Pap testing every 3 years, or they may recommend the Pap and pelvic exam, combined with testing for human papilloma virus (HPV), every 5 years. Some types of HPV increase your risk of cervical cancer. Testing for HPV may also be done on women of any age with unclear Pap test results.  Other health care providers may not recommend any screening for nonpregnant women who are considered low risk for pelvic cancer and who do not have symptoms. Ask your health care provider if a screening pelvic exam is right for   you.  If you have had past treatment for cervical cancer or a condition that could lead to cancer, you need Pap tests and screening for cancer for at least 20 years after your treatment. If Pap tests have been discontinued, your risk factors (such as having a new sexual partner) need to be reassessed to determine if screening should resume. Some women have medical problems that increase the chance of getting cervical cancer. In these cases, your health care provider may recommend more frequent screening and Pap tests.  Colorectal cancer can be detected and often prevented. Most routine colorectal cancer screening begins at the age of 50 years and continues through age 75 years. However, your health care provider may recommend screening at an earlier age if you have risk factors for colon cancer. On a yearly basis, your health care provider may provide home test kits to check  for hidden blood in the stool. Use of a small camera at the end of a tube, to directly examine the colon (sigmoidoscopy or colonoscopy), can detect the earliest forms of colorectal cancer. Talk to your health care provider about this at age 50, when routine screening begins. Direct exam of the colon should be repeated every 5-10 years through age 75 years, unless early forms of precancerous polyps or small growths are found.  People who are at an increased risk for hepatitis B should be screened for this virus. You are considered at high risk for hepatitis B if:  You were born in a country where hepatitis B occurs often. Talk with your health care provider about which countries are considered high risk.  Your parents were born in a high-risk country and you have not received a shot to protect against hepatitis B (hepatitis B vaccine).  You have HIV or AIDS.  You use needles to inject street drugs.  You live with, or have sex with, someone who has hepatitis B.  You get hemodialysis treatment.  You take certain medicines for conditions like cancer, organ transplantation, and autoimmune conditions.  Hepatitis C blood testing is recommended for all people born from 1945 through 1965 and any individual with known risks for hepatitis C.  Practice safe sex. Use condoms and avoid high-risk sexual practices to reduce the spread of sexually transmitted infections (STIs). STIs include gonorrhea, chlamydia, syphilis, trichomonas, herpes, HPV, and human immunodeficiency virus (HIV). Herpes, HIV, and HPV are viral illnesses that have no cure. They can result in disability, cancer, and death.  You should be screened for sexually transmitted illnesses (STIs) including gonorrhea and chlamydia if:  You are sexually active and are younger than 24 years.  You are older than 24 years and your health care provider tells you that you are at risk for this type of infection.  Your sexual activity has changed  since you were last screened and you are at an increased risk for chlamydia or gonorrhea. Ask your health care provider if you are at risk.  If you are at risk of being infected with HIV, it is recommended that you take a prescription medicine daily to prevent HIV infection. This is called preexposure prophylaxis (PrEP). You are considered at risk if:  You are sexually active and do not regularly use condoms or know the HIV status of your partner(s).  You take drugs by injection.  You are sexually active with a partner who has HIV.  Talk with your health care provider about whether you are at high risk of being infected with HIV. If   you choose to begin PrEP, you should first be tested for HIV. You should then be tested every 3 months for as long as you are taking PrEP.  Osteoporosis is a disease in which the bones lose minerals and strength with aging. This can result in serious bone fractures or breaks. The risk of osteoporosis can be identified using a bone density scan. Women ages 67 years and over and women at risk for fractures or osteoporosis should discuss screening with their health care providers. Ask your health care provider whether you should take a calcium supplement or vitamin D to reduce the rate of osteoporosis.  Menopause can be associated with physical symptoms and risks. Hormone replacement therapy is available to decrease symptoms and risks. You should talk to your health care provider about whether hormone replacement therapy is right for you.  Use sunscreen. Apply sunscreen liberally and repeatedly throughout the day. You should seek shade when your shadow is shorter than you. Protect yourself by wearing long sleeves, pants, a wide-brimmed hat, and sunglasses year round, whenever you are outdoors.  Once a month, do a whole body skin exam, using a mirror to look at the skin on your back. Tell your health care provider of new moles, moles that have irregular borders, moles that  are larger than a pencil eraser, or moles that have changed in shape or color.  Stay current with required vaccines (immunizations).  Influenza vaccine. All adults should be immunized every year.  Tetanus, diphtheria, and acellular pertussis (Td, Tdap) vaccine. Pregnant women should receive 1 dose of Tdap vaccine during each pregnancy. The dose should be obtained regardless of the length of time since the last dose. Immunization is preferred during the 27th-36th week of gestation. An adult who has not previously received Tdap or who does not know her vaccine status should receive 1 dose of Tdap. This initial dose should be followed by tetanus and diphtheria toxoids (Td) booster doses every 10 years. Adults with an unknown or incomplete history of completing a 3-dose immunization series with Td-containing vaccines should begin or complete a primary immunization series including a Tdap dose. Adults should receive a Td booster every 10 years.  Varicella vaccine. An adult without evidence of immunity to varicella should receive 2 doses or a second dose if she has previously received 1 dose. Pregnant females who do not have evidence of immunity should receive the first dose after pregnancy. This first dose should be obtained before leaving the health care facility. The second dose should be obtained 4-8 weeks after the first dose.  Human papillomavirus (HPV) vaccine. Females aged 13-26 years who have not received the vaccine previously should obtain the 3-dose series. The vaccine is not recommended for use in pregnant females. However, pregnancy testing is not needed before receiving a dose. If a female is found to be pregnant after receiving a dose, no treatment is needed. In that case, the remaining doses should be delayed until after the pregnancy. Immunization is recommended for any person with an immunocompromised condition through the age of 61 years if she did not get any or all doses earlier. During the  3-dose series, the second dose should be obtained 4-8 weeks after the first dose. The third dose should be obtained 24 weeks after the first dose and 16 weeks after the second dose.  Zoster vaccine. One dose is recommended for adults aged 30 years or older unless certain conditions are present.  Measles, mumps, and rubella (MMR) vaccine. Adults born  before 1957 generally are considered immune to measles and mumps. Adults born in 1957 or later should have 1 or more doses of MMR vaccine unless there is a contraindication to the vaccine or there is laboratory evidence of immunity to each of the three diseases. A routine second dose of MMR vaccine should be obtained at least 28 days after the first dose for students attending postsecondary schools, health care workers, or international travelers. People who received inactivated measles vaccine or an unknown type of measles vaccine during 1963-1967 should receive 2 doses of MMR vaccine. People who received inactivated mumps vaccine or an unknown type of mumps vaccine before 1979 and are at high risk for mumps infection should consider immunization with 2 doses of MMR vaccine. For females of childbearing age, rubella immunity should be determined. If there is no evidence of immunity, females who are not pregnant should be vaccinated. If there is no evidence of immunity, females who are pregnant should delay immunization until after pregnancy. Unvaccinated health care workers born before 1957 who lack laboratory evidence of measles, mumps, or rubella immunity or laboratory confirmation of disease should consider measles and mumps immunization with 2 doses of MMR vaccine or rubella immunization with 1 dose of MMR vaccine.  Pneumococcal 13-valent conjugate (PCV13) vaccine. When indicated, a person who is uncertain of his immunization history and has no record of immunization should receive the PCV13 vaccine. All adults 65 years of age and older should receive this  vaccine. An adult aged 19 years or older who has certain medical conditions and has not been previously immunized should receive 1 dose of PCV13 vaccine. This PCV13 should be followed with a dose of pneumococcal polysaccharide (PPSV23) vaccine. Adults who are at high risk for pneumococcal disease should obtain the PPSV23 vaccine at least 8 weeks after the dose of PCV13 vaccine. Adults older than 47 years of age who have normal immune system function should obtain the PPSV23 vaccine dose at least 1 year after the dose of PCV13 vaccine.  Pneumococcal polysaccharide (PPSV23) vaccine. When PCV13 is also indicated, PCV13 should be obtained first. All adults aged 65 years and older should be immunized. An adult younger than age 65 years who has certain medical conditions should be immunized. Any person who resides in a nursing home or long-term care facility should be immunized. An adult smoker should be immunized. People with an immunocompromised condition and certain other conditions should receive both PCV13 and PPSV23 vaccines. People with human immunodeficiency virus (HIV) infection should be immunized as soon as possible after diagnosis. Immunization during chemotherapy or radiation therapy should be avoided. Routine use of PPSV23 vaccine is not recommended for American Indians, [place] Natives, or people younger than 65 years unless there are medical conditions that require PPSV23 vaccine. When indicated, people who have unknown immunization and have no record of immunization should receive PPSV23 vaccine. One-time revaccination 5 years after the first dose of PPSV23 is recommended for people aged 19-64 years who have chronic kidney failure, nephrotic syndrome, asplenia, or immunocompromised conditions. People who received 1-2 doses of PPSV23 before age 65 years should receive another dose of PPSV23 vaccine at age 65 years or later if at least 5 years have passed since the previous dose. Doses of PPSV23 are not  needed for people immunized with PPSV23 at or after age 65 years.  Meningococcal vaccine. Adults with asplenia or persistent complement component deficiencies should receive 2 doses of quadrivalent meningococcal conjugate (MenACWY-D) vaccine. The doses should be obtained   at least 2 months apart. Microbiologists working with certain meningococcal bacteria, [place] recruits, people at risk during an outbreak, and people who travel to or live in countries with a high rate of meningitis should be immunized. A first-year college student up through age 34 years who is living in a residence hall should receive a dose if she did not receive a dose on or after her 16th birthday. Adults who have certain high-risk conditions should receive one or more doses of vaccine.  Hepatitis A vaccine. Adults who wish to be protected from this disease, have certain high-risk conditions, work with hepatitis A-infected animals, work in hepatitis A research labs, or travel to or work in countries with a high rate of hepatitis A should be immunized. Adults who were previously unvaccinated and who anticipate close contact with an international adoptee during the first 60 days after arrival in the [place] from a country with a high rate of hepatitis A should be immunized.  Hepatitis B vaccine. Adults who wish to be protected from this disease, have certain high-risk conditions, may be exposed to blood or other infectious body fluids, are household contacts or sex partners of hepatitis B positive people, are clients or workers in certain care facilities, or travel to or work in countries with a high rate of hepatitis B should be immunized.  Haemophilus influenzae type b (Hib) vaccine. A previously unvaccinated person with asplenia or sickle cell disease or having a scheduled splenectomy should receive 1 dose of Hib vaccine. Regardless of previous immunization, a recipient of a hematopoietic stem cell transplant should receive a  3-dose series 6-12 months after her successful transplant. Hib vaccine is not recommended for adults with HIV infection. Preventive Services / Frequency Ages 35 to 4 years  Blood pressure check.** / Every 3-5 years.  Lipid and cholesterol check.** / Every 5 years beginning at age 60.  Clinical breast exam.** / Every 3 years for women in their 71s and 10s.  BRCA-related cancer risk assessment.** / For women who have family members with a BRCA-related cancer (breast, ovarian, tubal, or peritoneal cancers).  Pap test.** / Every 2 years from ages 76 through 26. Every 3 years starting at age 61 through age 76 or 93 with a history of 3 consecutive normal Pap tests.  HPV screening.** / Every 3 years from ages 37 through ages 60 to 51 with a history of 3 consecutive normal Pap tests.  Hepatitis C blood test.** / For any individual with known risks for hepatitis C.  Skin self-exam. / Monthly.  Influenza vaccine. / Every year.  Tetanus, diphtheria, and acellular pertussis (Tdap, Td) vaccine.** / Consult your health care provider. Pregnant women should receive 1 dose of Tdap vaccine during each pregnancy. 1 dose of Td every 10 years.  Varicella vaccine.** / Consult your health care provider. Pregnant females who do not have evidence of immunity should receive the first dose after pregnancy.  HPV vaccine. / 3 doses over 6 months, if 93 and younger. The vaccine is not recommended for use in pregnant females. However, pregnancy testing is not needed before receiving a dose.  Measles, mumps, rubella (MMR) vaccine.** / You need at least 1 dose of MMR if you were born in 1957 or later. You may also need a 2nd dose. For females of childbearing age, rubella immunity should be determined. If there is no evidence of immunity, females who are not pregnant should be vaccinated. If there is no evidence of immunity, females who are  pregnant should delay immunization until after pregnancy.  Pneumococcal  13-valent conjugate (PCV13) vaccine.** / Consult your health care provider.  Pneumococcal polysaccharide (PPSV23) vaccine.** / 1 to 2 doses if you smoke cigarettes or if you have certain conditions.  Meningococcal vaccine.** / 1 dose if you are age 68 to 8 years and a Market researcher living in a residence hall, or have one of several medical conditions, you need to get vaccinated against meningococcal disease. You may also need additional booster doses.  Hepatitis A vaccine.** / Consult your health care provider.  Hepatitis B vaccine.** / Consult your health care provider.  Haemophilus influenzae type b (Hib) vaccine.** / Consult your health care provider. Ages 7 to 53 years  Blood pressure check.** / Every year.  Lipid and cholesterol check.** / Every 5 years beginning at age 25 years.  Lung cancer screening. / Every year if you are aged 11-80 years and have a 30-pack-year history of smoking and currently smoke or have quit within the past 15 years. Yearly screening is stopped once you have quit smoking for at least 15 years or develop a health problem that would prevent you from having lung cancer treatment.  Clinical breast exam.** / Every year after age 48 years.  BRCA-related cancer risk assessment.** / For women who have family members with a BRCA-related cancer (breast, ovarian, tubal, or peritoneal cancers).  Mammogram.** / Every year beginning at age 41 years and continuing for as long as you are in good health. Consult with your health care provider.  Pap test.** / Every 3 years starting at age 65 years through age 37 or 70 years with a history of 3 consecutive normal Pap tests.  HPV screening.** / Every 3 years from ages 72 years through ages 60 to 40 years with a history of 3 consecutive normal Pap tests.  Fecal occult blood test (FOBT) of stool. / Every year beginning at age 21 years and continuing until age 5 years. You may not need to do this test if you get  a colonoscopy every 10 years.  Flexible sigmoidoscopy or colonoscopy.** / Every 5 years for a flexible sigmoidoscopy or every 10 years for a colonoscopy beginning at age 35 years and continuing until age 48 years.  Hepatitis C blood test.** / For all people born from [birth year] through 1965 and any individual with known risks for hepatitis C.  Skin self-exam. / Monthly.  Influenza vaccine. / Every year.  Tetanus, diphtheria, and acellular pertussis (Tdap/Td) vaccine.** / Consult your health care provider. Pregnant women should receive 1 dose of Tdap vaccine during each pregnancy. 1 dose of Td every 10 years.  Varicella vaccine.** / Consult your health care provider. Pregnant females who do not have evidence of immunity should receive the first dose after pregnancy.  Zoster vaccine.** / 1 dose for adults aged 30 years or older.  Measles, mumps, rubella (MMR) vaccine.** / You need at least 1 dose of MMR if you were born in 1957 or later. You may also need a second dose. For females of childbearing age, rubella immunity should be determined. If there is no evidence of immunity, females who are not pregnant should be vaccinated. If there is no evidence of immunity, females who are pregnant should delay immunization until after pregnancy.  Pneumococcal 13-valent conjugate (PCV13) vaccine.** / Consult your health care provider.  Pneumococcal polysaccharide (PPSV23) vaccine.** / 1 to 2 doses if you smoke cigarettes or if you have certain conditions.  Meningococcal vaccine.** /  Consult your health care provider.  Hepatitis A vaccine.** / Consult your health care provider.  Hepatitis B vaccine.** / Consult your health care provider.  Haemophilus influenzae type b (Hib) vaccine.** / Consult your health care provider. Ages 64 years and over  Blood pressure check.** / Every year.  Lipid and cholesterol check.** / Every 5 years beginning at age 23 years.  Lung cancer screening. / Every year if you  are aged 16-80 years and have a 30-pack-year history of smoking and currently smoke or have quit within the past 15 years. Yearly screening is stopped once you have quit smoking for at least 15 years or develop a health problem that would prevent you from having lung cancer treatment.  Clinical breast exam.** / Every year after age 74 years.  BRCA-related cancer risk assessment.** / For women who have family members with a BRCA-related cancer (breast, ovarian, tubal, or peritoneal cancers).  Mammogram.** / Every year beginning at age 44 years and continuing for as long as you are in good health. Consult with your health care provider.  Pap test.** / Every 3 years starting at age 58 years through age 22 or 39 years with 3 consecutive normal Pap tests. Testing can be stopped between 65 and 70 years with 3 consecutive normal Pap tests and no abnormal Pap or HPV tests in the past 10 years.  HPV screening.** / Every 3 years from ages 64 years through ages 70 or 61 years with a history of 3 consecutive normal Pap tests. Testing can be stopped between 65 and 70 years with 3 consecutive normal Pap tests and no abnormal Pap or HPV tests in the past 10 years.  Fecal occult blood test (FOBT) of stool. / Every year beginning at age 40 years and continuing until age 27 years. You may not need to do this test if you get a colonoscopy every 10 years.  Flexible sigmoidoscopy or colonoscopy.** / Every 5 years for a flexible sigmoidoscopy or every 10 years for a colonoscopy beginning at age 7 years and continuing until age 32 years.  Hepatitis C blood test.** / For all people born from [birth year] through 1967 and any individual with known risks for hepatitis C.  Osteoporosis screening.** / A one-time screening for women ages 30 years and over and women at risk for fractures or osteoporosis.  Skin self-exam. / Monthly.  Influenza vaccine. / Every year.  Tetanus, diphtheria, and acellular pertussis (Tdap/Td)  vaccine.** / 1 dose of Td every 10 years.  Varicella vaccine.** / Consult your health care provider.  Zoster vaccine.** / 1 dose for adults aged 35 years or older.  Pneumococcal 13-valent conjugate (PCV13) vaccine.** / Consult your health care provider.  Pneumococcal polysaccharide (PPSV23) vaccine.** / 1 dose for all adults aged 46 years and older.  Meningococcal vaccine.** / Consult your health care provider.  Hepatitis A vaccine.** / Consult your health care provider.  Hepatitis B vaccine.** / Consult your health care provider.  Haemophilus influenzae type b (Hib) vaccine.** / Consult your health care provider. ** Family history and personal history of risk and conditions may change your health care provider's recommendations.   This information is not intended to replace advice given to you by your health care provider. Make sure you discuss any questions you have with your health care provider.   Document Released: 03/16/2001 Document Revised: 02/08/2014 Document Reviewed: 06/15/2010 Elsevier Interactive Patient Education [employer].

## 2015-12-08 NOTE — Assessment & Plan Note (Signed)
Well controlled, no changes to meds. Encouraged heart healthy diet such as the DASH diet and exercise as tolerated.  °

## 2015-12-08 NOTE — Assessment & Plan Note (Signed)
Encouraged heart healthy diet, increase exercise, avoid trans fats, consider a krill oil cap daily 

## 2015-12-08 NOTE — Assessment & Plan Note (Signed)
minimize simple carbs. Increase exercise as tolerated.  

## 2015-12-09 ENCOUNTER — Other Ambulatory Visit: Payer: Self-pay | Admitting: Family Medicine

## 2015-12-09 ENCOUNTER — Other Ambulatory Visit (INDEPENDENT_AMBULATORY_CARE_PROVIDER_SITE_OTHER): Payer: 59

## 2015-12-09 DIAGNOSIS — E039 Hypothyroidism, unspecified: Secondary | ICD-10-CM | POA: Diagnosis not present

## 2015-12-09 DIAGNOSIS — E0590 Thyrotoxicosis, unspecified without thyrotoxic crisis or storm: Secondary | ICD-10-CM

## 2015-12-09 LAB — T4, FREE: Free T4: 0.65 ng/dL (ref 0.60–1.60)

## 2015-12-09 MED ORDER — LEVOTHYROXINE SODIUM 25 MCG PO TABS: 25.0000 ug | ORAL_TABLET | Freq: Every day | ORAL | 3 refills | Status: DC

## 2015-12-09 MED ORDER — ATORVASTATIN CALCIUM 10 MG PO TABS: ORAL_TABLET | ORAL | 5 refills | Status: DC

## 2015-12-11 ENCOUNTER — Telehealth: Payer: Self-pay | Admitting: Family Medicine

## 2015-12-11 NOTE — Telephone Encounter (Signed)
Faxed PA  Completed form to [phone] for the medication Methylphenidate  [employer]. Awaiting response.

## 2015-12-14 NOTE — Assessment & Plan Note (Signed)
Started on Levothyroxine 25 mg daily

## 2015-12-14 NOTE — Assessment & Plan Note (Signed)
Patient encouraged to maintain heart healthy diet, regular exercise, adequate sleep. Consider daily probiotics. Take medications as prescribed. Given and reviewed copy of ACP documents from [place] Secretary of State and encouraged to complete and return. Labs reviewed.  

## 2015-12-14 NOTE — Progress Notes (Signed)
Patient ID: Lauren Hall, female   DOB: 09/16/1970, 45 y.o.   MRN: [id number]   Subjective:    Patient ID: Lauren Hall, female    DOB: 02-10-1970, 45 y.o.   MRN: [id number]  Chief Complaint  Patient presents with  . Annual Exam    HPI Patient is in today for annual preventative exam and follow up on numerous medical concerns. She notes her fibromyalgia and fatigue continue to worsen. She struggles with anhedonia, anxiety and depression. She denies suicidal ideation. No recent hospitalizations or acute illness. Continues to follow with GYN for routine care as well. Denies CP/palp/SOB/HA/congestion/fevers/GI or GU c/o. Taking meds as prescribed  Past Medical History:  Diagnosis Date  . Allergy   . Anemia   . Anxiety   . Bronchitis 07/27/2011  . 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 Complication of anesthesia    patient woke up while they were sewing up her incision with breast lumpectomy  . Depression   . Dysrhythmia    Tachycardia  . Enlarged thyroid   . Fatigue 10/08/2010  . Fibromyalgia 2009  . GERD (gastroesophageal reflux disease) 09/26/2013  . Headache(784.0) 10/08/2010   Migraines  . Hip pain, bilateral 11/06/2010  . Hyperglycemia 08/11/2015   normal Hgb A 1C per patient  . Hypertension   . Low back pain radiating to right leg 03/11/2015  . Other and unspecified hyperlipidemia 07/29/2012  . Peripheral neuropathy (HCC) 11/11/2012   burning sensation mainly right leg  . Poor concentration 01/11/2011  . Preventative health care 07/29/2012  . Sinusitis, acute 07/27/2011  . Tachycardia   . Thyroid disease 10/08/2010  . Tick bite of flank 11/09/2011  . Tinea corporis 11/11/2012  . Uterine leiomyoma     Past Surgical History:  Procedure Laterality Date  . BREAST LUMPECTOMY Right 1999   benign   . CYSTOSCOPY N/A 10/07/2015   Procedure: CYSTOSCOPY;  Surgeon: Sanjuana Kava, MD;  Location: [facility];  Service: Gynecology;  Laterality: N/A;    Family History  Problem Relation Age of  Onset  . Fibromyalgia Mother   . Diabetes Mother     type 2  . Hypertension Mother   . Hyperlipidemia Mother   . Arthritis Mother   . Alcohol abuse Father   . Colon cancer Father   . Hypertension Brother   . Alcohol abuse Brother   . Heart disease Maternal Grandmother   . Hyperlipidemia Maternal Grandmother   . Diabetes Maternal Grandmother     type 2  . Colon cancer Maternal Grandmother   . Stroke Maternal Grandfather   . Diabetes Maternal Grandfather     type 2  . Ovarian cancer Paternal Grandmother   . Alzheimer's disease Paternal Grandfather     Social History   Social History  . Marital status: Married    Spouse name: Montine Circle  . Number of children: N/A  . Years of education: N/A   Occupational History  . Not on file.   Social History Main Topics  . Smoking status: Never Smoker  . Smokeless tobacco: Never Used  . Alcohol use Yes     Comment: special occassion- very rarely  . Drug use: No  . Sexual activity: Yes    Partners: Male    Birth control/ protection: None   Other Topics Concern  . Not on file   Social History Narrative  . No narrative on file    Outpatient Medications Prior to Visit  Medication Sig Dispense Refill  . B Complex-C (B-COMPLEX  WITH VITAMIN C) tablet Take 1 tablet by mouth daily.    . cyclobenzaprine (FLEXERIL) 5 MG tablet Take 5 mg by mouth 3 (three) times daily as needed for muscle spasms.   1  . fish oil-omega-3 fatty acids 1000 MG capsule MegaRed 1 cap by mouth daily (by Schiff) (Patient taking differently: Take 1 capsule by mouth daily. MegaRed) 30 capsule 1  . ibuprofen (ADVIL,MOTRIN) 800 MG tablet Take 1 tablet (800 mg total) by mouth every 8 (eight) hours as needed (mild pain). 60 tablet 2  . omeprazole (PRILOSEC) 40 MG capsule TAKE 1 CAPSULE BY MOUTH EVERY DAY 90 capsule 0  . ondansetron (ZOFRAN) 4 MG tablet Take 1 tablet (4 mg total) by mouth every 8 (eight) hours as needed for nausea or vomiting. 20 tablet 1  . ranitidine  (ZANTAC) 300 MG tablet Take 1 tablet (300 mg total) by mouth at bedtime. 90 tablet 1  . gabapentin (NEURONTIN) 300 MG capsule TAKE 1 CAPSULE BY MOUTH TWICE DAILY AND 2 CAPSULES BY MOUTH AT BEDTIME 360 capsule 0  . lidocaine (LIDODERM) 5 % Place 2 patches onto the skin daily. Remove & Discard patch within 12 hours or as directed by MD (Patient taking differently: Place 2 patches onto the skin daily as needed (for pain). Remove & Discard patch within 12 hours or as directed by MD) 60 patch 1  . lisinopril (PRINIVIL,ZESTRIL) 10 MG tablet Take 1 tablet (10 mg total) by mouth 2 (two) times daily. 180 tablet 0  . LORazepam (ATIVAN) 0.5 MG tablet Take 1 tablet (0.5 mg total) by mouth 2 (two) times daily as needed. 40 tablet 1  . methocarbamol (ROBAXIN) 500 MG tablet Take 1 tablet (500 mg total) by mouth 2 (two) times daily as needed for muscle spasms. 60 tablet 1  . nebivolol (BYSTOLIC) 5 MG tablet Take 1 tablet (5 mg total) by mouth daily. 90 tablet 1  . oxyCODONE-acetaminophen (PERCOCET/ROXICET) 5-325 MG tablet Take 1-2 tablets by mouth every 4 (four) hours as needed (moderate to severe pain (when tolerating fluids)). 45 tablet 0  . venlafaxine XR (EFFEXOR-XR) 37.5 MG 24 hr capsule TAKE 1 CAPSULE(37.5 MG) BY MOUTH DAILY WITH BREAKFAST 30 capsule 0   No facility-administered medications prior to visit.     Allergies  Allergen Reactions  . Promethazine Anaphylaxis    Review of Systems  Constitutional: Positive for malaise/fatigue. Negative for fever.  HENT: Negative for congestion.   Eyes: Negative for blurred vision.  Respiratory: Negative for shortness of breath.   Cardiovascular: Negative for chest pain, palpitations and leg swelling.  Gastrointestinal: Negative for abdominal pain, blood in stool and nausea.  Genitourinary: Negative for dysuria and frequency.  Musculoskeletal: Positive for back pain, joint pain and myalgias. Negative for falls.  Skin: Negative for rash.  Neurological:  Negative for dizziness, loss of consciousness and headaches.  Endo/Heme/Allergies: Negative for environmental allergies.  Psychiatric/Behavioral: Positive for depression. The patient is nervous/anxious.        Objective:    Physical Exam  Constitutional: She is oriented to person, place, and time. She appears well-developed and well-nourished. No distress.  HENT:  Head: Normocephalic and atraumatic.  Eyes: Conjunctivae are normal.  Neck: Neck supple. No thyromegaly present.  Cardiovascular: Normal rate, regular rhythm and normal heart sounds.   No murmur heard. Pulmonary/Chest: Effort normal and breath sounds normal. No respiratory distress.  Abdominal: Soft. Bowel sounds are normal. She exhibits no distension and no mass. There is no tenderness.  Musculoskeletal: She exhibits  no edema.  Lymphadenopathy:    She has no cervical adenopathy.  Neurological: She is alert and oriented to person, place, and time.  Skin: Skin is warm and dry.  Psychiatric: She has a normal mood and affect. Her behavior is normal.    BP 102/68 (BP Location: Left Arm, Patient Position: Sitting, Cuff Size: Normal)   Pulse 98   Temp 98.4 F (36.9 C) (Oral)   Ht 4\' 11"  (1.499 m)   Wt 140 lb (63.5 kg)   SpO2 94%   BMI 28.28 kg/m  Wt Readings from Last 3 Encounters:  12/08/15 140 lb (63.5 kg)  10/07/15 139 lb (63 kg)  09/25/15 139 lb (63 kg)     Lab Results  Component Value Date   WBC 5.8 12/08/2015   HGB 12.5 12/08/2015   HCT 37.2 12/08/2015   PLT 308.0 12/08/2015   GLUCOSE 81 12/08/2015   CHOL 257 (H) 12/08/2015   TRIG 174.0 (H) 12/08/2015   HDL 76.00 12/08/2015   LDLCALC 146 (H) 12/08/2015   ALT 14 12/08/2015   AST 16 12/08/2015   NA 140 12/08/2015   K 3.6 12/08/2015   CL 103 12/08/2015   CREATININE 0.80 12/08/2015   BUN 8 12/08/2015   CO2 31 12/08/2015   TSH 0.24 (L) 12/08/2015   HGBA1C 5.4 12/08/2015    Lab Results  Component Value Date   TSH 0.24 (L) 12/08/2015   Lab  Results  Component Value Date   WBC 5.8 12/08/2015   HGB 12.5 12/08/2015   HCT 37.2 12/08/2015   MCV 88.1 12/08/2015   PLT 308.0 12/08/2015   Lab Results  Component Value Date   NA 140 12/08/2015   K 3.6 12/08/2015   CO2 31 12/08/2015   GLUCOSE 81 12/08/2015   BUN 8 12/08/2015   CREATININE 0.80 12/08/2015   BILITOT 0.3 12/08/2015   ALKPHOS 81 12/08/2015   AST 16 12/08/2015   ALT 14 12/08/2015   PROT 7.3 12/08/2015   ALBUMIN 4.5 12/08/2015   CALCIUM 9.7 12/08/2015   ANIONGAP 6 09/25/2015   GFR 98.86 12/08/2015   Lab Results  Component Value Date   CHOL 257 (H) 12/08/2015   Lab Results  Component Value Date   HDL 76.00 12/08/2015   Lab Results  Component Value Date   LDLCALC 146 (H) 12/08/2015   Lab Results  Component Value Date   TRIG 174.0 (H) 12/08/2015   Lab Results  Component Value Date   CHOLHDL 3 12/08/2015   Lab Results  Component Value Date   HGBA1C 5.4 12/08/2015       Assessment & Plan:   Problem List Items Addressed This Visit    Fibromyalgia    Worsening pain, refill given on Gabapentin and Venlafaxine increased      Depression    Struggling with tearfulness and worsening fatigue, anhedonia, anxiety and worsening depression. Increase Venlafaxine R 75 mg daily      Relevant Medications   LORazepam (ATIVAN) 0.5 MG tablet   venlafaxine XR (EFFEXOR XR) 75 MG 24 hr capsule   Hypertension    Well controlled, no changes to meds. Encouraged heart healthy diet such as the DASH diet and exercise as tolerated.       Relevant Medications   nebivolol (BYSTOLIC) 5 MG tablet   lisinopril (PRINIVIL,ZESTRIL) 10 MG tablet   Other Relevant Orders   Comprehensive metabolic panel (Completed)   Fatigue    Multifactorial but lab work reveals mildly liw thyroid function.  Hypothyroidism    Started on Levothyroxine 25 mg daily      Relevant Medications   nebivolol (BYSTOLIC) 5 MG tablet   Tachycardia   Relevant Medications   nebivolol  (BYSTOLIC) 5 MG tablet   Other Relevant Orders   TSH (Completed)   CBC (Completed)   Hyperlipidemia    Encouraged heart healthy diet, increase exercise, avoid trans fats, consider a krill oil cap daily      Relevant Medications   nebivolol (BYSTOLIC) 5 MG tablet   lisinopril (PRINIVIL,ZESTRIL) 10 MG tablet   Other Relevant Orders   Lipid panel (Completed)   Preventative health care    Patient encouraged to maintain heart healthy diet, regular exercise, adequate sleep. Consider daily probiotics. Take medications as prescribed. Given and reviewed copy of ACP documents from [employer] and encouraged to complete and return. Labs reviewed      Hyperglycemia     minimize simple carbs. Increase exercise as tolerated.       Relevant Orders   Hemoglobin A1c (Completed)    Other Visit Diagnoses    Encounter for immunization       Relevant Orders   Flu Vaccine QUAD 36+ mos IM (Completed)      I have discontinued Ms. Deer's oxyCODONE-acetaminophen and venlafaxine XR. I am also having her start on venlafaxine XR and methylphenidate. Additionally, I am having her maintain her fish oil-omega-3 fatty acids, ondansetron, ranitidine, cyclobenzaprine, B-complex with vitamin C, ibuprofen, omeprazole, nebivolol, methocarbamol, LORazepam, lisinopril, lidocaine, and gabapentin.  Meds ordered this encounter  Medications  . nebivolol (BYSTOLIC) 5 MG tablet    Sig: Take 1 tablet (5 mg total) by mouth daily.    Dispense:  90 tablet    Refill:  1  . methocarbamol (ROBAXIN) 500 MG tablet    Sig: Take 1 tablet (500 mg total) by mouth 2 (two) times daily as needed for muscle spasms.    Dispense:  60 tablet    Refill:  5  . LORazepam (ATIVAN) 0.5 MG tablet    Sig: Take 1 tablet (0.5 mg total) by mouth 2 (two) times daily as needed.    Dispense:  40 tablet    Refill:  5  . lisinopril (PRINIVIL,ZESTRIL) 10 MG tablet    Sig: Take 1 tablet (10 mg total) by mouth 2 (two) times daily.     Dispense:  180 tablet    Refill:  1  . lidocaine (LIDODERM) 5 %    Sig: Place 2 patches onto the skin daily. Remove & Discard patch within 12 hours or as directed by MD    Dispense:  60 patch    Refill:  1  . gabapentin (NEURONTIN) 300 MG capsule    Sig: TAKE 1 CAPSULE BY MOUTH TWICE DAILY AND 2 CAPSULES BY MOUTH AT BEDTIME    Dispense:  360 capsule    Refill:  1  . venlafaxine XR (EFFEXOR XR) 75 MG 24 hr capsule    Sig: Take 1 capsule (75 mg total) by mouth daily with breakfast.    Dispense:  30 capsule    Refill:  5  . methylphenidate (RITALIN) 10 MG tablet    Sig: Take 0.5-2 tablets (5-20 mg total) by mouth daily as needed.    Dispense:  60 tablet    Refill:  0     Penni Homans, MD

## 2015-12-14 NOTE — Assessment & Plan Note (Signed)
Multifactorial but lab work reveals mildly liw thyroid function.

## 2015-12-19 NOTE — Telephone Encounter (Signed)
PA for Methylphenidate [employer] was approved on 12/11/2015 through 12/10/2016. Reference number is [id number] Pharmacy/patient will be informed.

## 2016-01-05 ENCOUNTER — Other Ambulatory Visit: Payer: Self-pay | Admitting: Family Medicine

## 2016-01-06 ENCOUNTER — Ambulatory Visit: Payer: 59 | Admitting: Family Medicine

## 2016-01-14 ENCOUNTER — Ambulatory Visit (INDEPENDENT_AMBULATORY_CARE_PROVIDER_SITE_OTHER): Payer: 59 | Admitting: Gastroenterology

## 2016-01-14 ENCOUNTER — Encounter: Payer: Self-pay | Admitting: Gastroenterology

## 2016-01-14 VITALS — BP 122/80 | HR 68 | Ht 59.0 in | Wt 140.4 lb

## 2016-01-14 DIAGNOSIS — Z800 Family history of malignant neoplasm of digestive organs: Secondary | ICD-10-CM | POA: Diagnosis not present

## 2016-01-14 DIAGNOSIS — R1013 Epigastric pain: Secondary | ICD-10-CM | POA: Diagnosis not present

## 2016-01-14 DIAGNOSIS — G8929 Other chronic pain: Secondary | ICD-10-CM

## 2016-01-14 DIAGNOSIS — M797 Fibromyalgia: Secondary | ICD-10-CM | POA: Diagnosis not present

## 2016-01-14 MED ORDER — NA SULFATE-K SULFATE-MG SULF 17.5-3.13-1.6 GM/177ML PO SOLN: 1.0000 | Freq: Once | ORAL | 0 refills | Status: AC

## 2016-01-14 NOTE — Progress Notes (Signed)
[facility] Consult Note:  History: Lauren Hall 01/14/2016  Referring physician: Penni Homans, MD  Reason for consult/chief complaint: Colon Cancer Screening (Patient compains of GERD, change in bowel habits, epigastric burning sensation and nausea)   Subjective  HPI:  This is a 45 year old woman referred by her gynecologist primarily for family history of colon cancer. However, the patient wanted to see [place] because she describes about a year of dyspepsia pyrosis and nausea that has not responded to high-dose acid suppression. She initially seemed to get good improvement with omeprazole 40 mg once per day,, but this seemed to gradually lose some fact, she then was prescribed ranitidine 300 mg at night. Despite that, she will often wake in the morning with a burning discomfort in the chest or epigastrium. She does not get regurgitation and denies dysphagia vomiting or weight loss. She also has chronic bloating for many years. She reports lifelong constipation, sometimes in childhood she would have BMs once a week. For the last couple of decades it is typically been twice a week with no rectal bleeding. She takes ibuprofen on average once daily for fibromyalgia, and says that recently one dose of an ADD med" that my stomach off".  ROS:  Review of Systems  Constitutional: Negative for appetite change and unexpected weight change.  HENT: Negative for mouth sores and voice change.   Eyes: Negative for pain and redness.  Respiratory: Negative for cough and shortness of breath.   Cardiovascular: Negative for chest pain and palpitations.  Genitourinary: Negative for dysuria and hematuria.  Musculoskeletal: Positive for arthralgias and myalgias.  Skin: Negative for pallor and rash.  Neurological: Negative for weakness and headaches.  Hematological: Negative for adenopathy.     Past Medical History: Past Medical History:  Diagnosis Date  . Allergy   . Anemia   . Anxiety    . Bronchitis 07/27/2011  . 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 Complication of anesthesia    patient woke up while they were sewing up her incision with breast lumpectomy  . Depression   . Dysrhythmia    Tachycardia  . Enlarged thyroid   . Fatigue 10/08/2010  . Fibromyalgia 2009  . GERD (gastroesophageal reflux disease) 09/26/2013  . Headache(784.0) 10/08/2010   Migraines  . Hip pain, bilateral 11/06/2010  . Hyperglycemia 08/11/2015   normal Hgb A 1C per patient  . Hypertension   . Low back pain radiating to right leg 03/11/2015  . Other and unspecified hyperlipidemia 07/29/2012  . Peripheral neuropathy (HCC) 11/11/2012   burning sensation mainly right leg  . Poor concentration 01/11/2011  . Preventative health care 07/29/2012  . Sinusitis, acute 07/27/2011  . Tachycardia   . Thyroid disease 10/08/2010  . Tick bite of flank 11/09/2011  . Tinea corporis 11/11/2012  . Uterine leiomyoma      Past Surgical History: Past Surgical History:  Procedure Laterality Date  . BREAST LUMPECTOMY Right 1999   benign   . CYSTOSCOPY N/A 10/07/2015   Procedure: CYSTOSCOPY;  Surgeon: Sanjuana Kava, MD;  Location: [facility];  Service: Gynecology;  Laterality: N/A;     Family History: Family History  Problem Relation Age of Onset  . Fibromyalgia Mother   . Diabetes Mother     type 2  . Hypertension Mother   . Hyperlipidemia Mother   . Arthritis Mother   . Alcohol abuse Father   . Colon cancer Father   . Hypertension Brother   . Alcohol abuse Brother   . Heart disease Maternal  Grandmother   . Hyperlipidemia Maternal Grandmother   . Diabetes Maternal Grandmother     type 2  . Colon cancer Maternal Grandmother   . Stroke Maternal Grandfather   . Diabetes Maternal Grandfather     type 2  . Ovarian cancer Paternal Grandmother   . Alzheimer's disease Paternal Grandfather    Father and MGM CRC  Social History: Social History   Social History  . Marital status: Married    Spouse name: Montine Circle  .  Number of children: N/A  . Years of education: N/A   Social History Main Topics  . Smoking status: Never Smoker  . Smokeless tobacco: Never Used  . Alcohol use Yes     Comment: special occassion- very rarely  . Drug use: No  . Sexual activity: Yes    Partners: Male    Birth control/ protection: None   Other Topics Concern  . None   Social History Narrative  . None    Allergies: Allergies  Allergen Reactions  . Promethazine Anaphylaxis    Outpatient Meds: Current Outpatient Prescriptions  Medication Sig Dispense Refill  . atorvastatin (LIPITOR) 10 MG tablet Take 1/2 tablet by mouth daily. 15 tablet 5  . cyclobenzaprine (FLEXERIL) 5 MG tablet Take 5 mg by mouth 3 (three) times daily as needed for muscle spasms.   1  . fish oil-omega-3 fatty acids 1000 MG capsule MegaRed 1 cap by mouth daily (by Schiff) (Patient taking differently: Take 1 capsule by mouth daily. MegaRed) 30 capsule 1  . gabapentin (NEURONTIN) 300 MG capsule TAKE 1 CAPSULE BY MOUTH TWICE DAILY AND 2 CAPSULES BY MOUTH AT BEDTIME 360 capsule 1  . ibuprofen (ADVIL,MOTRIN) 800 MG tablet Take 1 tablet (800 mg total) by mouth every 8 (eight) hours as needed (mild pain). 60 tablet 2  . lidocaine (LIDODERM) 5 % Place 2 patches onto the skin daily. Remove & Discard patch within 12 hours or as directed by MD 60 patch 1  . lisinopril (PRINIVIL,ZESTRIL) 10 MG tablet Take 1 tablet (10 mg total) by mouth 2 (two) times daily. 180 tablet 1  . LORazepam (ATIVAN) 0.5 MG tablet Take 1 tablet (0.5 mg total) by mouth 2 (two) times daily as needed. 40 tablet 5  . methocarbamol (ROBAXIN) 500 MG tablet Take 1 tablet (500 mg total) by mouth 2 (two) times daily as needed for muscle spasms. 60 tablet 5  . methylphenidate (RITALIN) 10 MG tablet Take 0.5-2 tablets (5-20 mg total) by mouth daily as needed. 60 tablet 0  . nebivolol (BYSTOLIC) 5 MG tablet Take 1 tablet (5 mg total) by mouth daily. 90 tablet 1  . omeprazole (PRILOSEC) 40 MG  capsule TAKE 1 CAPSULE BY MOUTH EVERY DAY 90 capsule 0  . ondansetron (ZOFRAN) 4 MG tablet Take 1 tablet (4 mg total) by mouth every 8 (eight) hours as needed for nausea or vomiting. 20 tablet 1  . ranitidine (ZANTAC) 300 MG tablet Take 1 tablet (300 mg total) by mouth at bedtime. 90 tablet 1  . venlafaxine XR (EFFEXOR XR) 75 MG 24 hr capsule Take 1 capsule (75 mg total) by mouth daily with breakfast. 30 capsule 5  . levothyroxine (SYNTHROID, LEVOTHROID) 25 MCG tablet Take 1 tablet (25 mcg total) by mouth daily before breakfast. (Patient not taking: Reported on 01/14/2016) 30 tablet 3  . Na Sulfate-K Sulfate-Mg Sulf 17.5-3.13-1.6 GM/180ML SOLN Take 1 kit by mouth once. 354 mL 0   No current facility-administered medications for this visit.  ___________________________________________________________________ Objective   Exam:  BP 122/80   Pulse 68   Ht _0  (1.499 m)   Wt 140 lb 6.4 oz (63.7 kg)   LMP 09/02/2015 (Approximate)   BMI 28.36 kg/m    General: this is a(n) Well-appearing middle-aged woman   Eyes: sclera anicteric, no redness  ENT: oral mucosa moist without lesions, no cervical or supraclavicular lymphadenopathy, good dentition  CV: RRR without murmur, S1/S2, no JVD, no peripheral edema  Resp: clear to auscultation bilaterally, normal RR and effort noted  GI: soft, overweight, no tenderness, with active bowel sounds. No guarding or palpable organomegaly noted.  Skin; warm and dry, no rash or jaundice noted  Neuro: awake, alert and oriented x 3. Normal gross motor function and fluent speech  Labs:  Negative H. pylori antibody   Assessment: Encounter Diagnoses  Name Primary?  . Abdominal pain, chronic, epigastric Yes  . Family history of colon cancer in father   . Fibromyalgia   Most likely nonulcer dyspepsia, perhaps related to visceral pain syndrome.  Plan:  Screening colonoscopy due to family history EGD  We discussed the usual triggers for  dyspepsia in the limited available medicines. I think she would have reassurance even if the upper endoscopy is normal.  Thank you for the courtesy of this consult.  Please call me with any questions or concerns.  Nelida Meuse III  CC: Penni Homans, MD

## 2016-01-14 NOTE — Patient Instructions (Signed)
If you are age 47 or older, your body mass index should be between 23-30. Your Body mass index is 28.36 kg/m. If this is out of the aforementioned range listed, please consider follow up with your Primary Care Provider.  If you are age 62 or younger, your body mass index should be between 19-25. Your Body mass index is 28.36 kg/m. If this is out of the aformentioned range listed, please consider follow up with your Primary Care Provider.   We have sent the following medications to your pharmacy for you to pick up at your convenience: [place] have been scheduled for a colonoscopy. Please follow written instructions given to you at your visit today.  Please pick up your prep supplies at the pharmacy within the next 1-3 days. If you use inhalers (even only as needed), please bring them with you on the day of your procedure. Your physician has requested that you go to www.startemmi.com and enter the access code given to you at your visit today. This web site gives a general overview about your procedure. However, you should still follow specific instructions given to you by our office regarding your preparation for the procedure.  Thank you for choosing [facility]  Dr Wilfrid Lund III

## 2016-03-10 ENCOUNTER — Other Ambulatory Visit: Payer: Self-pay

## 2016-03-10 ENCOUNTER — Encounter: Payer: Self-pay | Admitting: Gastroenterology

## 2016-03-10 ENCOUNTER — Telehealth: Payer: Self-pay | Admitting: Gastroenterology

## 2016-03-10 NOTE — Telephone Encounter (Signed)
No show for EGD/colonoscopy today. No call received.  Please charge no-show fee

## 2016-03-11 ENCOUNTER — Other Ambulatory Visit: Payer: Self-pay

## 2016-04-24 ENCOUNTER — Other Ambulatory Visit: Payer: Self-pay | Admitting: Family Medicine

## 2016-05-08 ENCOUNTER — Other Ambulatory Visit: Payer: Self-pay | Admitting: Family Medicine

## 2016-05-20 ENCOUNTER — Encounter: Payer: Self-pay | Admitting: Family Medicine

## 2016-05-20 ENCOUNTER — Ambulatory Visit (INDEPENDENT_AMBULATORY_CARE_PROVIDER_SITE_OTHER): Payer: Self-pay | Admitting: Family Medicine

## 2016-05-20 VITALS — BP 125/82 | HR 76 | Temp 98.4°F | Wt 142.0 lb

## 2016-05-20 DIAGNOSIS — Z23 Encounter for immunization: Secondary | ICD-10-CM

## 2016-05-20 DIAGNOSIS — M797 Fibromyalgia: Secondary | ICD-10-CM

## 2016-05-20 DIAGNOSIS — R000 Tachycardia, unspecified: Secondary | ICD-10-CM

## 2016-05-20 DIAGNOSIS — Q0700 Arnold-Chiari syndrome without spina bifida or hydrocephalus: Secondary | ICD-10-CM | POA: Diagnosis not present

## 2016-05-20 DIAGNOSIS — E785 Hyperlipidemia, unspecified: Secondary | ICD-10-CM | POA: Diagnosis not present

## 2016-05-20 DIAGNOSIS — I10 Essential (primary) hypertension: Secondary | ICD-10-CM

## 2016-05-20 DIAGNOSIS — E039 Hypothyroidism, unspecified: Secondary | ICD-10-CM

## 2016-05-20 DIAGNOSIS — K219 Gastro-esophageal reflux disease without esophagitis: Secondary | ICD-10-CM | POA: Diagnosis not present

## 2016-05-20 DIAGNOSIS — R739 Hyperglycemia, unspecified: Secondary | ICD-10-CM | POA: Diagnosis not present

## 2016-05-20 MED ORDER — ATORVASTATIN CALCIUM 10 MG PO TABS: ORAL_TABLET | ORAL | 5 refills | Status: DC

## 2016-05-20 MED ORDER — LIDOCAINE 5 % EX PTCH
2.0000 | MEDICATED_PATCH | CUTANEOUS | 1 refills | Status: DC
Start: 2016-05-20 — End: 2016-10-07

## 2016-05-20 MED ORDER — NEBIVOLOL HCL 5 MG PO TABS: 5.0000 mg | ORAL_TABLET | Freq: Every day | ORAL | 1 refills | Status: DC

## 2016-05-20 MED ORDER — LEVOTHYROXINE SODIUM 25 MCG PO TABS: ORAL_TABLET | ORAL | 1 refills | Status: DC

## 2016-05-20 NOTE — Progress Notes (Signed)
Pre visit review using our clinic review tool, if applicable. No additional management support is needed unless otherwise documented below in the visit note. 

## 2016-05-20 NOTE — Assessment & Plan Note (Signed)
Encouraged heart healthy diet, increase exercise, avoid trans fats, consider a krill oil cap daily 

## 2016-05-20 NOTE — Progress Notes (Signed)
Patient ID: Lauren Hall, female   DOB: 1970/12/19, 46 y.o.   MRN: [id number]   Subjective:  I acted as a Education administrator for Penni Homans, [place], [place]   Patient ID: Lauren Hall, female    DOB: May 10, 1970, 46 y.o.   MRN: [id number]  Chief Complaint  Patient presents with  . Follow-up    Medication F/U.    HPI  Patient is in today for an Office visit to discuss medications and fibromyalgia. Patient states that she was taken off of Ritalin by her Gastroenterologist. Patient has a Hx of fibromyalgia, HTN, GERD, hyperglycemia. Patient has no additional acute concerns noted at this time. She continues to struggle with myalgias, back pain and fatigue. Denies CP/palp/SOB/HA/congestion/fevers/GI or GU c/o. Taking meds as prescribed  Patient Care Team: Mosie Lukes, MD as PCP - General (Family Medicine)   Past Medical History:  Diagnosis Date  . Allergy   . Anemia   . Anxiety   . Bronchitis 07/27/2011  . 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 Complication of anesthesia    patient woke up while they were sewing up her incision with breast lumpectomy  . Depression   . Dysrhythmia    Tachycardia  . Enlarged thyroid   . Fatigue 10/08/2010  . Fibromyalgia 2009  . GERD (gastroesophageal reflux disease) 09/26/2013  . Headache(784.0) 10/08/2010   Migraines  . Hip pain, bilateral 11/06/2010  . Hyperglycemia 08/11/2015   normal Hgb A 1C per patient  . Hypertension   . Low back pain radiating to right leg 03/11/2015  . Other and unspecified hyperlipidemia 07/29/2012  . Peripheral neuropathy 11/11/2012   burning sensation mainly right leg  . Poor concentration 01/11/2011  . Preventative health care 07/29/2012  . Sinusitis, acute 07/27/2011  . Tachycardia   . Thyroid disease 10/08/2010  . Tick bite of flank 11/09/2011  . Tinea corporis 11/11/2012  . Uterine leiomyoma     Past Surgical History:  Procedure Laterality Date  . BREAST LUMPECTOMY Right 1999   benign   . CYSTOSCOPY N/A 10/07/2015   Procedure: CYSTOSCOPY;  Surgeon: Sanjuana Kava, MD;  Location: [facility];  Service: Gynecology;  Laterality: N/A;    Family History  Problem Relation Age of Onset  . Fibromyalgia Mother   . Diabetes Mother     type 2  . Hypertension Mother   . Hyperlipidemia Mother   . Arthritis Mother   . Alcohol abuse Father   . Colon cancer Father   . Hypertension Brother   . Alcohol abuse Brother   . Heart disease Maternal Grandmother   . Hyperlipidemia Maternal Grandmother   . Diabetes Maternal Grandmother     type 2  . Colon cancer Maternal Grandmother   . Stroke Maternal Grandfather   . Diabetes Maternal Grandfather     type 2  . Ovarian cancer Paternal Grandmother   . Alzheimer's disease Paternal Grandfather     Social History   Social History  . Marital status: Married    Spouse name: Montine Circle  . Number of children: N/A  . Years of education: N/A   Occupational History  . Not on file.   Social History Main Topics  . Smoking status: Never Smoker  . Smokeless tobacco: Never Used  . Alcohol use Yes     Comment: special occassion- very rarely  . Drug use: No  . Sexual activity: Yes    Partners: Male    Birth control/ protection: None   Other Topics Concern  .  Not on file   Social History Narrative  . No narrative on file    Outpatient Medications Prior to Visit  Medication Sig Dispense Refill  . cyclobenzaprine (FLEXERIL) 5 MG tablet Take 5 mg by mouth 3 (three) times daily as needed for muscle spasms.   1  . fish oil-omega-3 fatty acids 1000 MG capsule MegaRed 1 cap by mouth daily (by Schiff) (Patient taking differently: Take 1 capsule by mouth daily. MegaRed) 30 capsule 1  . gabapentin (NEURONTIN) 300 MG capsule TAKE 1 CAPSULE BY MOUTH TWICE DAILY AND 2 CAPSULES BY MOUTH AT BEDTIME 360 capsule 1  . ibuprofen (ADVIL,MOTRIN) 800 MG tablet Take 1 tablet (800 mg total) by mouth every 8 (eight) hours as needed (mild pain). 60 tablet 2  . lisinopril (PRINIVIL,ZESTRIL) 10 MG  tablet Take 1 tablet (10 mg total) by mouth 2 (two) times daily. 180 tablet 1  . LORazepam (ATIVAN) 0.5 MG tablet Take 1 tablet (0.5 mg total) by mouth 2 (two) times daily as needed. 40 tablet 5  . methocarbamol (ROBAXIN) 500 MG tablet Take 1 tablet (500 mg total) by mouth 2 (two) times daily as needed for muscle spasms. 60 tablet 5  . omeprazole (PRILOSEC) 40 MG capsule TAKE 1 CAPSULE BY MOUTH EVERY DAY 90 capsule 0  . ondansetron (ZOFRAN) 4 MG tablet Take 1 tablet (4 mg total) by mouth every 8 (eight) hours as needed for nausea or vomiting. 20 tablet 1  . ranitidine (ZANTAC) 300 MG tablet Take 1 tablet (300 mg total) by mouth at bedtime. 90 tablet 1  . venlafaxine XR (EFFEXOR-XR) 75 MG 24 hr capsule TAKE 1 CAPSULE(75 MG) BY MOUTH DAILY WITH BREAKFAST 30 capsule 0  . atorvastatin (LIPITOR) 10 MG tablet Take 1/2 tablet by mouth daily. 15 tablet 5  . levothyroxine (SYNTHROID, LEVOTHROID) 25 MCG tablet TAKE 1 TABLET(25 MCG) BY MOUTH DAILY BEFORE BREAKFAST 30 tablet 0  . lidocaine (LIDODERM) 5 % Place 2 patches onto the skin daily. Remove & Discard patch within 12 hours or as directed by MD 60 patch 1  . nebivolol (BYSTOLIC) 5 MG tablet Take 1 tablet (5 mg total) by mouth daily. 90 tablet 1  . methylphenidate (RITALIN) 10 MG tablet Take 0.5-2 tablets (5-20 mg total) by mouth daily as needed. (Patient not taking: Reported on 05/20/2016) 60 tablet 0   No facility-administered medications prior to visit.     Allergies  Allergen Reactions  . Promethazine Anaphylaxis    Review of Systems  Constitutional: Positive for malaise/fatigue. Negative for fever.  HENT: Negative for congestion.   Eyes: Negative for blurred vision.  Respiratory: Negative for shortness of breath.   Cardiovascular: Negative for chest pain, palpitations and leg swelling.  Gastrointestinal: Positive for heartburn. Negative for abdominal pain, blood in stool and nausea.  Genitourinary: Negative for dysuria and frequency.    Musculoskeletal: Positive for back pain, myalgias and neck pain. Negative for falls.  Skin: Negative for rash.  Neurological: Negative for dizziness, loss of consciousness and headaches.  Endo/Heme/Allergies: Negative for environmental allergies.  Psychiatric/Behavioral: Negative for depression. The patient is not nervous/anxious.        Objective:    Physical Exam  Constitutional: She is oriented to person, place, and time. She appears well-developed and well-nourished. No distress.  HENT:  Head: Normocephalic and atraumatic.  Nose: Nose normal.  Eyes: Right eye exhibits no discharge. Left eye exhibits no discharge.  Neck: Normal range of motion. Neck supple.  Cardiovascular: Normal rate and regular  rhythm.   No murmur heard. Pulmonary/Chest: Effort normal and breath sounds normal.  Abdominal: Soft. Bowel sounds are normal. There is no tenderness.  Musculoskeletal: She exhibits no edema.  Neurological: She is alert and oriented to person, place, and time.  Skin: Skin is warm and dry.  Psychiatric: She has a normal mood and affect.  Nursing note and vitals reviewed.   BP 125/82 (BP Location: Left Arm, Patient Position: Sitting, Cuff Size: Normal)   Pulse 76   Temp 98.4 F (36.9 C) (Oral)   Wt 142 lb (64.4 kg)   LMP 09/02/2015 (Approximate) Comment: continous bleeding  SpO2 99% Comment: RA  BMI 28.68 kg/m  Wt Readings from Last 3 Encounters:  05/20/16 142 lb (64.4 kg)  01/14/16 140 lb 6.4 oz (63.7 kg)  12/08/15 140 lb (63.5 kg)   BP Readings from Last 3 Encounters:  05/20/16 125/82  01/14/16 122/80  12/08/15 102/68     Immunization History  Administered Date(s) Administered  . Influenza Whole 10/07/2010  . Influenza,inj,Quad PF,36+ Mos 11/06/2012, 09/24/2013, 12/08/2015  . Td 02/01/2001  . Tdap 05/20/2016    Health Maintenance  Topic Date Due  . HIV Screening  11/23/1983  . INFLUENZA VACCINE  09/01/2016  . PAP SMEAR  08/06/2018  . TETANUS/TDAP  05/21/2026     Lab Results  Component Value Date   WBC 5.8 12/08/2015   HGB 12.5 12/08/2015   HCT 37.2 12/08/2015   PLT 308.0 12/08/2015   GLUCOSE 81 12/08/2015   CHOL 257 (H) 12/08/2015   TRIG 174.0 (H) 12/08/2015   HDL 76.00 12/08/2015   LDLCALC 146 (H) 12/08/2015   ALT 14 12/08/2015   AST 16 12/08/2015   NA 140 12/08/2015   K 3.6 12/08/2015   CL 103 12/08/2015   CREATININE 0.80 12/08/2015   BUN 8 12/08/2015   CO2 31 12/08/2015   TSH 0.24 (L) 12/08/2015   HGBA1C 5.4 12/08/2015    Lab Results  Component Value Date   TSH 0.24 (L) 12/08/2015   Lab Results  Component Value Date   WBC 5.8 12/08/2015   HGB 12.5 12/08/2015   HCT 37.2 12/08/2015   MCV 88.1 12/08/2015   PLT 308.0 12/08/2015   Lab Results  Component Value Date   NA 140 12/08/2015   K 3.6 12/08/2015   CO2 31 12/08/2015   GLUCOSE 81 12/08/2015   BUN 8 12/08/2015   CREATININE 0.80 12/08/2015   BILITOT 0.3 12/08/2015   ALKPHOS 81 12/08/2015   AST 16 12/08/2015   ALT 14 12/08/2015   PROT 7.3 12/08/2015   ALBUMIN 4.5 12/08/2015   CALCIUM 9.7 12/08/2015   ANIONGAP 6 09/25/2015   GFR 98.86 12/08/2015   Lab Results  Component Value Date   CHOL 257 (H) 12/08/2015   Lab Results  Component Value Date   HDL 76.00 12/08/2015   Lab Results  Component Value Date   LDLCALC 146 (H) 12/08/2015   Lab Results  Component Value Date   TRIG 174.0 (H) 12/08/2015   Lab Results  Component Value Date   CHOLHDL 3 12/08/2015   Lab Results  Component Value Date   HGBA1C 5.4 12/08/2015         Assessment & Plan:   Problem List Items Addressed This Visit    Fibromyalgia    Did not have a good response to Ritalin and then they stopped it due to side effects of shaking and abdominal pain and her fatigue is not worse so she is going  to stay off.      Hypertension    Well controlled, no changes to meds. Encouraged heart healthy diet such as the DASH diet and exercise as tolerated.       Relevant Medications    atorvastatin (LIPITOR) 10 MG tablet   nebivolol (BYSTOLIC) 5 MG tablet   Other Relevant Orders   CBC   Comprehensive metabolic panel   Hypothyroidism    On Levothyroxine, continue to monitor      Relevant Medications   levothyroxine (SYNTHROID, LEVOTHROID) 25 MCG tablet   nebivolol (BYSTOLIC) 5 MG tablet   Tachycardia   Relevant Medications   nebivolol (BYSTOLIC) 5 MG tablet   Hyperlipidemia    Encouraged heart healthy diet, increase exercise, avoid trans fats, consider a krill oil cap daily      Relevant Medications   atorvastatin (LIPITOR) 10 MG tablet   nebivolol (BYSTOLIC) 5 MG tablet   Other Relevant Orders   Lipid panel   TSH   GERD (gastroesophageal reflux disease)    Avoid offending foods, start probiotics. Do not eat large meals in late evening and consider raising head of bed. Stopped Ritalin due to GI upset and is doing better.      Arnold-Chiari syndrome (HCC)    No recent headache or new neurologic concerns      Hyperglycemia    hgba1c acceptable, minimize simple carbs. Increase exercise as tolerated. Continue current meds      Relevant Orders   Hemoglobin A1c    Other Visit Diagnoses    Need for diphtheria-tetanus-pertussis (Tdap) vaccine    -  Primary   Relevant Orders   Tdap vaccine greater than or equal to 7yo IM (Completed)      I have discontinued Ms. Zubiate's methylphenidate. I am also having her maintain her fish oil-omega-3 fatty acids, ondansetron, ranitidine, cyclobenzaprine, ibuprofen, omeprazole, methocarbamol, LORazepam, lisinopril, gabapentin, venlafaxine XR, atorvastatin, levothyroxine, lidocaine, and nebivolol.  Meds ordered this encounter  Medications  . atorvastatin (LIPITOR) 10 MG tablet    Sig: Take 1/2 tablet by mouth daily.    Dispense:  15 tablet    Refill:  5  . levothyroxine (SYNTHROID, LEVOTHROID) 25 MCG tablet    Sig: TAKE 1 TABLET(25 MCG) BY MOUTH DAILY BEFORE BREAKFAST    Dispense:  90 tablet    Refill:  1  .  lidocaine (LIDODERM) 5 %    Sig: Place 2 patches onto the skin daily. Remove & Discard patch within 12 hours or as directed by MD    Dispense:  60 patch    Refill:  1  . nebivolol (BYSTOLIC) 5 MG tablet    Sig: Take 1 tablet (5 mg total) by mouth daily.    Dispense:  90 tablet    Refill:  1    [name] served as Education administrator during this visit. History, Physical and Plan performed by medical provider. Documentation and orders reviewed and attested to.  Penni Homans, MD

## 2016-05-20 NOTE — Assessment & Plan Note (Signed)
Did not have a good response to Ritalin and then they stopped it due to side effects of shaking and abdominal pain and her fatigue is not worse so she is going to stay off.

## 2016-05-20 NOTE — Assessment & Plan Note (Signed)
hgba1c acceptable, minimize simple carbs. Increase exercise as tolerated. Continue current meds 

## 2016-05-20 NOTE — Patient Instructions (Signed)
Myofascial Pain Syndrome and Fibromyalgia Myofascial pain syndrome and fibromyalgia are both pain disorders. This pain may be felt mainly in your muscles.  Myofascial pain syndrome:  Always has trigger points or tender points in the muscle that will cause pain when pressed. The pain may come and go.  Usually affects your neck, upper back, and shoulder areas. The pain often radiates into your arms and hands.  Fibromyalgia:  Has muscle pains and tenderness that come and go.  Is often associated with fatigue and sleep disturbances.  Has trigger points.  Tends to be long-lasting (chronic), but is not life-threatening. Fibromyalgia and myofascial pain are not the same. However, they often occur together. If you have both conditions, each can make the other worse. Both are common and can cause enough pain and fatigue to make day-to-day activities difficult. What are the causes? The exact causes of fibromyalgia and myofascial pain are not known. People with certain gene types may be more likely to develop fibromyalgia. Some factors can be triggers for both conditions, such as:  Spine disorders.  Arthritis.  Severe injury (trauma) and other physical stressors.  Being under a lot of stress.  A medical illness. What are the signs or symptoms? Fibromyalgia  The main symptom of fibromyalgia is widespread pain and tenderness in your muscles. This can vary over time. Pain is sometimes described as stabbing, shooting, or burning. You may have tingling or numbness, too. You may also have sleep problems and fatigue. You may wake up feeling tired and groggy (fibro fog). Other symptoms may include:  Bowel and bladder problems.  Headaches.  Visual problems.  Problems with odors and noises.  Depression or mood changes.  Painful menstrual periods (dysmenorrhea).  Dry skin or eyes. Myofascial pain syndrome  Symptoms of myofascial pain syndrome include:  Tight, ropy bands of  muscle.  Uncomfortable sensations in muscular areas, such as:  Aching.  Cramping.  Burning.  Numbness.  Tingling.  Muscle weakness.  Trouble moving certain muscles freely (range of motion). How is this diagnosed? There are no specific tests to diagnose fibromyalgia or myofascial pain syndrome. Both can be hard to diagnose because their symptoms are common in many other conditions. Your health care provider may suspect one or both of these conditions based on your symptoms and medical history. Your health care provider will also do a physical exam. The key to diagnosing fibromyalgia is having pain, fatigue, and other symptoms for more than three months that cannot be explained by another condition. The key to diagnosing myofascial pain syndrome is finding trigger points in muscles that are tender and cause pain elsewhere in your body (referred pain). How is this treated? Treating fibromyalgia and myofascial pain often requires a team of health care providers. This usually starts with your primary provider and a physical therapist. You may also find it helpful to work with alternative health care providers, such as massage therapists or acupuncturists. Treatment for fibromyalgia may include medicines. This may include nonsteroidal anti-inflammatory drugs (NSAIDs), along with other medicines. Treatment for myofascial pain may also include:  NSAIDs.  Cooling and stretching of muscles.  Trigger point injections.  Sound wave (ultrasound) treatments to stimulate muscles. Follow these instructions at home:  Take medicines only as directed by your health care provider.  Exercise as directed by your health care provider or physical therapist.  Try to avoid stressful situations.  Practice relaxation techniques to control your stress. You may want to try:  Biofeedback.  Visual imagery.  Hypnosis.    Muscle relaxation.  Yoga.  Meditation.  Talk to your health care provider  about alternative treatments, such as acupuncture or massage treatment.  Maintain a healthy lifestyle. This includes eating a healthy diet and getting enough sleep.  Consider joining a support group.  Do not do activities that stress or strain your muscles. That includes repetitive motions and heavy lifting. Where to find more information:  National Fibromyalgia Association: www.fmaware.org  Arthritis Foundation: www.arthritis.org  American Chronic Pain Association: www.theacpa.org/condition/myofascial-pain Contact a health care provider if:  You have new symptoms.  Your symptoms get worse.  You have side effects from your medicines.  You have trouble sleeping.  Your condition is causing depression or anxiety. This information is not intended to replace advice given to you by your health care provider. Make sure you discuss any questions you have with your health care provider. Document Released: 01/18/2005 Document Revised: 06/26/2015 Document Reviewed: 10/24/2013 Elsevier Interactive Patient Education  2017 Elsevier Inc.  

## 2016-05-23 NOTE — Assessment & Plan Note (Signed)
Avoid offending foods, start probiotics. Do not eat large meals in late evening and consider raising head of bed. Stopped Ritalin due to GI upset and is doing better.

## 2016-05-23 NOTE — Assessment & Plan Note (Signed)
No recent headache or new neurologic concerns

## 2016-05-23 NOTE — Assessment & Plan Note (Signed)
Well controlled, no changes to meds. Encouraged heart healthy diet such as the DASH diet and exercise as tolerated.  °

## 2016-05-23 NOTE — Assessment & Plan Note (Signed)
On Levothyroxine, continue to monitor 

## 2016-06-05 ENCOUNTER — Other Ambulatory Visit: Payer: Self-pay | Admitting: Family Medicine

## 2016-06-25 ENCOUNTER — Other Ambulatory Visit: Payer: Self-pay | Admitting: Family Medicine

## 2016-06-25 ENCOUNTER — Encounter: Payer: Self-pay | Admitting: Family Medicine

## 2016-06-25 ENCOUNTER — Telehealth: Payer: Self-pay | Admitting: Family Medicine

## 2016-06-25 MED ORDER — LORAZEPAM 0.5 MG PO TABS: 0.5000 mg | ORAL_TABLET | Freq: Two times a day (BID) | ORAL | 2 refills | Status: DC | PRN

## 2016-06-25 NOTE — Telephone Encounter (Signed)
Hardcopy/contract is at the front desk for pickup Called the patient left detailed message to pickup at the front desk/complete contract and update UDS.

## 2016-06-25 NOTE — Telephone Encounter (Signed)
OK to refill but needs uds and contract, I will print with 2 rf to hold her to appt

## 2016-06-25 NOTE — Telephone Encounter (Signed)
Printed contract/uds/prescription  Called the patient on both numbers left message to call back.

## 2016-06-25 NOTE — Telephone Encounter (Signed)
Requesting:   lorazepam Contract     none UDS   none Last OV    05/20/16---future appt is on 08/23/16 Last Refill    #40 with 5 refills on 12/08/15  Please Advise PHARMACY to Fax refill to is [phone]  [facility]

## 2016-07-14 ENCOUNTER — Other Ambulatory Visit: Payer: Self-pay | Admitting: Family Medicine

## 2016-07-15 ENCOUNTER — Encounter: Payer: Self-pay | Admitting: Family Medicine

## 2016-07-15 ENCOUNTER — Other Ambulatory Visit: Payer: Self-pay | Admitting: Family Medicine

## 2016-07-15 MED ORDER — LORAZEPAM 0.5 MG PO TABS: 0.5000 mg | ORAL_TABLET | Freq: Three times a day (TID) | ORAL | 0 refills | Status: DC

## 2016-07-15 NOTE — Addendum Note (Signed)
Addended by: Sharon Seller B on: 07/15/2016 11:46 AM   Modules accepted: Orders

## 2016-07-15 NOTE — Telephone Encounter (Signed)
Called left message to call back 

## 2016-07-15 NOTE — Telephone Encounter (Signed)
Printed script/contract/PCP signed Called the patient on both cell /home number left message to call back.

## 2016-07-15 NOTE — Telephone Encounter (Signed)
Requesting:    lorazapam Contract  none UDS  none Last OV     05/20/16----future appt is on 08/23/16 Last Refill   #40 with 2 refills on 06/25/16  Please Advise

## 2016-07-15 NOTE — Telephone Encounter (Signed)
I hve printed but needs uds and contract

## 2016-07-16 NOTE — Telephone Encounter (Signed)
Patient contacted/informed to pickup hardcopy/complete contract/UDS She verbalized understanding.

## 2016-07-16 NOTE — Telephone Encounter (Signed)
Called left detailed message to call back.

## 2016-07-17 ENCOUNTER — Other Ambulatory Visit: Payer: Self-pay | Admitting: Family Medicine

## 2016-08-09 ENCOUNTER — Other Ambulatory Visit: Payer: 59

## 2016-08-22 ENCOUNTER — Other Ambulatory Visit: Payer: Self-pay | Admitting: Family Medicine

## 2016-08-23 ENCOUNTER — Ambulatory Visit: Payer: 59 | Admitting: Family Medicine
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
## 2016-09-06 ENCOUNTER — Ambulatory Visit: Payer: 59 | Admitting: Family Medicine

## 2016-09-09 ENCOUNTER — Ambulatory Visit (INDEPENDENT_AMBULATORY_CARE_PROVIDER_SITE_OTHER)
Admission: RE | Admit: 2016-09-09 | Discharge: 2016-09-09 | Disposition: A | Discharge status: Home / Self Care | Payer: 59 | Source: Ambulatory Visit | Attending: Family Medicine | Admitting: Family Medicine

## 2016-09-09 ENCOUNTER — Encounter: Payer: Self-pay | Admitting: Family Medicine

## 2016-09-09 ENCOUNTER — Ambulatory Visit (INDEPENDENT_AMBULATORY_CARE_PROVIDER_SITE_OTHER): Payer: 59 | Admitting: Family Medicine

## 2016-09-09 VITALS — BP 118/90 | HR 92 | Ht 59.0 in | Wt 146.0 lb

## 2016-09-09 DIAGNOSIS — M5412 Radiculopathy, cervical region: Secondary | ICD-10-CM | POA: Diagnosis not present

## 2016-09-09 DIAGNOSIS — M542 Cervicalgia: Secondary | ICD-10-CM | POA: Diagnosis not present

## 2016-09-09 DIAGNOSIS — M50323 Other cervical disc degeneration at C6-C7 level: Secondary | ICD-10-CM | POA: Diagnosis not present

## 2016-09-09 DIAGNOSIS — M5033 Other cervical disc degeneration, cervicothoracic region: Secondary | ICD-10-CM | POA: Diagnosis not present

## 2016-09-09 MED ORDER — PREDNISONE 50 MG PO TABS: 50.0000 mg | ORAL_TABLET | Freq: Every day | ORAL | 0 refills | Status: DC

## 2016-09-09 NOTE — Progress Notes (Signed)
[facility] [address] [place], [place] [address] Phone: [phone] Subjective:    I'm seeing this patient by the request  of:  Mosie Lukes, MD'   CC:   [id number]  Lauren Hall is a 46 y.o. female coming in with complaint of neck and shoulder pain on the right side. She is having numbness and tingling down her arm and sometimes down her back. She has been having the pain for the past 6 months but it seems to be getting progressively worse. No history of neck or shoulder surgery/injury. Patient also notes that she has fibromyalgia which she feels may be contributing.  history of chiari malformation  Onset-  6 months Location- right shoulder and cervical spine Duration- constant Character- can be sharp but dull, achy Aggravating factors-moving her right shoulder and hand Reliving factors- massage, not using extremity  Therapies tried-  Severity- 6/10     Past Medical History:  Diagnosis Date  . Allergy   . Anemia   . Anxiety   . Bronchitis 07/27/2011  . 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 Complication of anesthesia    patient woke up while they were sewing up her incision with breast lumpectomy  . Depression   . Dysrhythmia    Tachycardia  . Enlarged thyroid   . Fatigue 10/08/2010  . Fibromyalgia 2009  . GERD (gastroesophageal reflux disease) 09/26/2013  . Headache(784.0) 10/08/2010   Migraines  . Hip pain, bilateral 11/06/2010  . Hyperglycemia 08/11/2015   normal Hgb A 1C per patient  . Hypertension   . Low back pain radiating to right leg 03/11/2015  . Other and unspecified hyperlipidemia 07/29/2012  . Peripheral neuropathy 11/11/2012   burning sensation mainly right leg  . Poor concentration 01/11/2011  . Preventative health care 07/29/2012  . Sinusitis, acute 07/27/2011  . Tachycardia   . Thyroid disease 10/08/2010  . Tick bite of flank 11/09/2011  . Tinea corporis 11/11/2012  . Uterine leiomyoma    Past Surgical History:    Procedure Laterality Date  . BREAST LUMPECTOMY Right 1999   benign   . CYSTOSCOPY N/A 10/07/2015   Procedure: CYSTOSCOPY;  Surgeon: Sanjuana Kava, MD;  Location: [facility];  Service: Gynecology;  Laterality: N/A;   Social History   Social History  . Marital status: Married    Spouse name: Montine Circle  . Number of children: N/A  . Years of education: N/A   Social History Main Topics  . Smoking status: Never Smoker  . Smokeless tobacco: Never Used  . Alcohol use Yes     Comment: special occassion- very rarely  . Drug use: No  . Sexual activity: Yes    Partners: Male    Birth control/ protection: None   Other Topics Concern  . Not on file   Social History Narrative  . No narrative on file   Allergies  Allergen Reactions  . Promethazine Anaphylaxis   Family History  Problem Relation Age of Onset  . Fibromyalgia Mother   . Diabetes Mother        type 2  . Hypertension Mother   . Hyperlipidemia Mother   . Arthritis Mother   . Alcohol abuse Father   . Colon cancer Father   . Hypertension Brother   . Alcohol abuse Brother   . Heart disease Maternal Grandmother   . Hyperlipidemia Maternal Grandmother   . Diabetes Maternal Grandmother        type 2  . Colon cancer  Maternal Grandmother   . Stroke Maternal Grandfather   . Diabetes Maternal Grandfather        type 2  . Ovarian cancer Paternal Grandmother   . Alzheimer's disease Paternal Grandfather      Past medical history, social, surgical and family history all reviewed in electronic medical record.  No pertanent information unless stated regarding to the chief complaint.   Review of Systems:Review of systems updated and as accurate as of 09/09/16  No headache, visual changes, nausea, vomiting, diarrhea, constipation, dizziness, abdominal pain, skin rash, fevers, chills, night sweats, weight loss, swollen lymph nodes, body aches, joint swelling, muscle aches, chest pain, shortness of breath, mood changes.   Objective   Last menstrual period 09/02/2015. Systems examined below as of 09/09/16   General: No apparent distress alert and oriented x3 mood and affect normal, dressed appropriately.  HEENT: Pupils equal, extraocular movements intact  Respiratory: Patient's speak in full sentences and does not appear short of breath  Cardiovascular: No lower extremity edema, non tender, no erythema  Skin: Warm dry intact with no signs of infection or rash on extremities or on axial skeleton.  Abdomen: Soft nontender  Neuro: Cranial nerves II through XII are intact, neurovascularly intact in all extremities with 2+ DTRs and 2+ pulses.  Lymph: No lymphadenopathy of posterior or anterior cervical chain or axillae bilaterally.  Gait normal with good balance and coordination.  MSK:  Non tender with full range of motion and good stability and symmetric strength and tone of shoulders, elbows, wrist, hip, knee and ankles bilaterally.  Neck: Inspection patient does have fullness of the thyroid. No palpable stepoffs. Positive Spurling's maneuver with radicular symptoms down the right arm. Lacks last 10 of extension and only 5 of right-sided side bending Grip strength and sensation normal in bilateral hands Weakness in the C8 distribution No sensory change to C4 to T1 Negative Hoffman sign bilaterally Reflexes normal Right-sided thenar wasting noted   Impression and Recommendations:     This case required medical decision making of moderate complexity.      Note: This dictation was prepared with Dragon dictation along with smaller phrase technology. Any transcriptional errors that result from this process are unintentional.

## 2016-09-09 NOTE — Patient Instructions (Signed)
Good to see you  Lauren Hall is your friend. Ice 20 minutes 2 times daily. Usually after activity and before bed. Xrays downstairs  Prednisone daily for 5 days  See me again in 7-8 days (OK to double book new patient spot)

## 2016-09-09 NOTE — Assessment & Plan Note (Signed)
Patient is having more radicular pain. Seems to be in the C8 distribution. Patient has some mild weakness as well. We do have some hypothenar eminence wasting. We will monitor closely. Patient given prednisone today. X-rays ordered today to further evaluate for any bony normality intact with activity. Follow-up again in 1 week. Worsening weakness advance imaging may be warranted. This has been going on greater than 6 months. Patient has muscle relaxers as well as neuromodulators already.

## 2016-09-16 ENCOUNTER — Ambulatory Visit (INDEPENDENT_AMBULATORY_CARE_PROVIDER_SITE_OTHER): Payer: 59 | Admitting: Family Medicine

## 2016-09-16 ENCOUNTER — Encounter: Payer: Self-pay | Admitting: Family Medicine

## 2016-09-16 DIAGNOSIS — M5030 Other cervical disc degeneration, unspecified cervical region: Secondary | ICD-10-CM | POA: Insufficient documentation

## 2016-09-16 DIAGNOSIS — M999 Biomechanical lesion, unspecified: Secondary | ICD-10-CM

## 2016-09-16 NOTE — Progress Notes (Signed)
[facility] [address] [place], [place] [address] Phone: [phone] Subjective:    I'm seeing this patient by the request  of:  Mosie Lukes, MD   CC: Neck and shoulder pain.  GOT:[id number]  Lauren Hall is a 46 y.o. female coming in with complaint of Neck and shoulder pain. Past medical history significant for fibromyalgia. Also has a history of Arnold-Chiari syndrome.  Patient sent have more of a cervical radiculopathy. Patient was put on prednisone as well as given gabapentin. Has noticed the radicular symptoms has improved significantly down the right arm. Still very intermittent now. Still having pain though in the neck.   patient most recent x-rays show the patient does have moderate osteophytic changes from C4-T1 significantly worse than previous imaging 4 years ago.  Past Medical History:  Diagnosis Date  . Allergy   . Anemia   . Anxiety   . Bronchitis 07/27/2011  . 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 Complication of anesthesia    patient woke up while they were sewing up her incision with breast lumpectomy  . Depression   . Dysrhythmia    Tachycardia  . Enlarged thyroid   . Fatigue 10/08/2010  . Fibromyalgia 2009  . GERD (gastroesophageal reflux disease) 09/26/2013  . Headache(784.0) 10/08/2010   Migraines  . Hip pain, bilateral 11/06/2010  . Hyperglycemia 08/11/2015   normal Hgb A 1C per patient  . Hypertension   . Low back pain radiating to right leg 03/11/2015  . Other and unspecified hyperlipidemia 07/29/2012  . Peripheral neuropathy 11/11/2012   burning sensation mainly right leg  . Poor concentration 01/11/2011  . Preventative health care 07/29/2012  . Sinusitis, acute 07/27/2011  . Tachycardia   . Thyroid disease 10/08/2010  . Tick bite of flank 11/09/2011  . Tinea corporis 11/11/2012  . Uterine leiomyoma    Past Surgical History:  Procedure Laterality Date  . BREAST LUMPECTOMY Right 1999   benign   . CYSTOSCOPY N/A  10/07/2015   Procedure: CYSTOSCOPY;  Surgeon: Sanjuana Kava, MD;  Location: [facility];  Service: Gynecology;  Laterality: N/A;   Social History   Social History  . Marital status: Married    Spouse name: Montine Circle  . Number of children: N/A  . Years of education: N/A   Social History Main Topics  . Smoking status: Never Smoker  . Smokeless tobacco: Never Used  . Alcohol use Yes     Comment: special occassion- very rarely  . Drug use: No  . Sexual activity: Yes    Partners: Male    Birth control/ protection: None   Other Topics Concern  . None   Social History Narrative  . None   Allergies  Allergen Reactions  . Promethazine Anaphylaxis   Family History  Problem Relation Age of Onset  . Fibromyalgia Mother   . Diabetes Mother        type 2  . Hypertension Mother   . Hyperlipidemia Mother   . Arthritis Mother   . Alcohol abuse Father   . Colon cancer Father   . Hypertension Brother   . Alcohol abuse Brother   . Heart disease Maternal Grandmother   . Hyperlipidemia Maternal Grandmother   . Diabetes Maternal Grandmother        type 2  . Colon cancer Maternal Grandmother   . Stroke Maternal Grandfather   . Diabetes Maternal Grandfather        type 2  . Ovarian cancer Paternal  Grandmother   . Alzheimer's disease Paternal Grandfather      Past medical history, social, surgical and family history all reviewed in electronic medical record.  No pertanent information unless stated regarding to the chief complaint.   Review of Systems: No headache, visual changes, nausea, vomiting, diarrhea, constipation, dizziness, abdominal pain, skin rash, fevers, chills, night sweats, weight loss, swollen lymph nodes, body aches, joint swelling, muscle aches, chest pain, shortness of breath, mood changes.  Positive muscle aches  Objective  Blood pressure 140/88, pulse 76, weight 146 lb (66.2 kg), last menstrual period 09/02/2015.   Systems examined below as of 09/16/16 General: NAD A&O  x3 mood, affect normal  HEENT: Pupils equal, extraocular movements intact no nystagmus Respiratory: not short of breath at rest or with speaking Cardiovascular: No lower extremity edema, non tender Skin: Warm dry intact with no signs of infection or rash on extremities or on axial skeleton. Abdomen: Soft nontender, no masses Neuro: Cranial nerves  intact, neurovascularly intact in all extremities with 2+ DTRs and 2+ pulses. Lymph: No lymphadenopathy appreciated today  Gait normal with good balance and coordination.  MSK: Non tender with full range of motion and good stability and symmetric strength and tone of shoulders, elbows, wrist,  knee hips and ankles bilaterally.    Neck: Inspection mild loss of lordosis patient doesn't thyromegaly. No palpable stepoffs. Negative Spurling's maneuver. Mild limitation lacking the last 5 of side bending bilaterally Grip strength and sensation normal in bilateral hands Strength good C4 to T1 distribution No sensory change to C4 to T1 Negative Hoffman sign bilaterally Reflexes normal   Osteopathic findings C2 flexed rotated and side bent right C4 flexed rotated and side bent left C6 flexed rotated and side bent left T3 extended rotated and side bent right inhaled third rib T9 extended rotated and side bent left L2 flexed rotated and side bent right Sacrum right on right      Impression and Recommendations:     This case required medical decision making of moderate complexity.      Note: This dictation was prepared with Dragon dictation along with smaller phrase technology. Any transcriptional errors that result from this process are unintentional.

## 2016-09-16 NOTE — Assessment & Plan Note (Signed)
Decision today to treat with OMT was based on Physical Exam  After verbal consent patient was treated with  ME, FPR techniques in cervical, thoracic, lumbar and sacral areas  Patient tolerated the procedure well with improvement in symptoms  Patient given exercises, stretches and lifestyle modifications  See medications in patient instructions if given  Patient will follow up in 4-6 weeks 

## 2016-09-16 NOTE — Assessment & Plan Note (Signed)
Patient does have x-rays showing degenerative disc disease. Discussed icing regimen and home exercises. Discussed posture and ergonomics. Did make some changes in medications. We discussed icing regimen. Patient will come back and see me again in 4 weeks. Hopefully patient will respond well to manipulation.

## 2016-09-16 NOTE — Patient Instructions (Addendum)
Good to see you  Lauren Hall is your friend.  Continue the gabapentin  Exercises 3 times a week.  See me again in 3 weeks.

## 2016-09-19 ENCOUNTER — Other Ambulatory Visit: Payer: Self-pay | Admitting: Family Medicine

## 2016-09-20 NOTE — Telephone Encounter (Signed)
Patient should be taking the 37.5mg 

## 2016-10-01 ENCOUNTER — Other Ambulatory Visit: Payer: Self-pay | Admitting: Family Medicine

## 2016-10-01 NOTE — Telephone Encounter (Signed)
30d Venlafaxine faxed till OV/thx dmf

## 2016-10-07 ENCOUNTER — Encounter: Payer: Self-pay | Admitting: Family Medicine

## 2016-10-07 ENCOUNTER — Ambulatory Visit (INDEPENDENT_AMBULATORY_CARE_PROVIDER_SITE_OTHER): Payer: 59 | Admitting: Family Medicine

## 2016-10-07 DIAGNOSIS — E782 Mixed hyperlipidemia: Secondary | ICD-10-CM

## 2016-10-07 DIAGNOSIS — E039 Hypothyroidism, unspecified: Secondary | ICD-10-CM

## 2016-10-07 DIAGNOSIS — R739 Hyperglycemia, unspecified: Secondary | ICD-10-CM | POA: Diagnosis not present

## 2016-10-07 DIAGNOSIS — M999 Biomechanical lesion, unspecified: Secondary | ICD-10-CM | POA: Diagnosis not present

## 2016-10-07 DIAGNOSIS — R000 Tachycardia, unspecified: Secondary | ICD-10-CM | POA: Diagnosis not present

## 2016-10-07 DIAGNOSIS — D649 Anemia, unspecified: Secondary | ICD-10-CM

## 2016-10-07 DIAGNOSIS — M797 Fibromyalgia: Secondary | ICD-10-CM

## 2016-10-07 MED ORDER — VENLAFAXINE HCL ER 150 MG PO TB24: 150.0000 mg | ORAL_TABLET | Freq: Every day | ORAL | 3 refills | Status: DC

## 2016-10-07 MED ORDER — CYCLOBENZAPRINE HCL 5 MG PO TABS: 5.0000 mg | ORAL_TABLET | Freq: Three times a day (TID) | ORAL | 2 refills | Status: DC | PRN

## 2016-10-07 NOTE — Patient Instructions (Addendum)
Tylenol/Acetaminophen ES 500 mg, 2 tabs twice daily  1 advil/motrin/ibuprofen or 1 aleve/naproxen twice daily  L Tryptophan caps or warm milk with cookies   Back Pain, Adult Back pain is very common in adults.The cause of back pain is rarely dangerous and the pain often gets better over time.The cause of your back pain may not be known. Some common causes of back pain include:  Strain of the muscles or ligaments supporting the spine.  Wear and tear (degeneration) of the spinal disks.  Arthritis.  Direct injury to the back.  For many people, back pain may return. Since back pain is rarely dangerous, most people can learn to manage this condition on their own. Follow these instructions at home: Watch your back pain for any changes. The following actions may help to lessen any discomfort you are feeling:  Remain active. It is stressful on your back to sit or stand in one place for long periods of time. Do not sit, drive, or stand in one place for more than 30 minutes at a time. Take short walks on even surfaces as soon as you are able.Try to increase the length of time you walk each day.  Exercise regularly as directed by your health care provider. Exercise helps your back heal faster. It also helps avoid future injury by keeping your muscles strong and flexible.  Do not stay in bed.Resting more than 1-2 days can delay your recovery.  Pay attention to your body when you bend and lift. The most comfortable positions are those that put less stress on your recovering back. Always use proper lifting techniques, including: ? Bending your knees. ? Keeping the load close to your body. ? Avoiding twisting.  Find a comfortable position to sleep. Use a firm mattress and lie on your side with your knees slightly bent. If you lie on your back, put a pillow under your knees.  Avoid feeling anxious or stressed.Stress increases muscle tension and can worsen back pain.It is important to  recognize when you are anxious or stressed and learn ways to manage it, such as with exercise.  Take medicines only as directed by your health care provider. Over-the-counter medicines to reduce pain and inflammation are often the most helpful.Your health care provider may prescribe muscle relaxant drugs.These medicines help dull your pain so you can more quickly return to your normal activities and healthy exercise.  Apply ice to the injured area: ? Put ice in a plastic bag. ? Place a towel between your skin and the bag. ? Leave the ice on for 20 minutes, 2-3 times a day for the first 2-3 days. After that, ice and heat may be alternated to reduce pain and spasms.  Maintain a healthy weight. Excess weight puts extra stress on your back and makes it difficult to maintain good posture.  Contact a health care provider if:  You have pain that is not relieved with rest or medicine.  You have increasing pain going down into the legs or buttocks.  You have pain that does not improve in one week.  You have night pain.  You lose weight.  You have a fever or chills. Get help right away if:  You develop new bowel or bladder control problems.  You have unusual weakness or numbness in your arms or legs.  You develop nausea or vomiting.  You develop abdominal pain.  You feel faint. This information is not intended to replace advice given to you by your health care provider.  Make sure you discuss any questions you have with your health care provider. Document Released: 01/18/2005 Document Revised: 05/29/2015 Document Reviewed: 05/22/2013 Elsevier Interactive Patient Education  2017 [employer].

## 2016-10-07 NOTE — Assessment & Plan Note (Addendum)
On Levothyroxine, continue to monitor, mild goiter noted on exam  Will proceed with ultrasound of thyroid to investigate

## 2016-10-07 NOTE — Assessment & Plan Note (Signed)
Encouraged heart healthy diet, increase exercise, avoid trans fats, consider a fish oil cap daily 

## 2016-10-07 NOTE — Assessment & Plan Note (Signed)
Having right sided sciatica recently. Methocarbimol not helping. Cyclobezaprine more helpful. Add Tylenol 1000 mg twice daily, follow up with sports medicine

## 2016-10-07 NOTE — Assessment & Plan Note (Signed)
hgba1c acceptable, minimize simple carbs. Increase exercise as tolerated.  

## 2016-10-08 ENCOUNTER — Ambulatory Visit: Payer: 59 | Admitting: Family Medicine

## 2016-10-10 NOTE — Progress Notes (Signed)
Patient ID: Lauren Hall, female   DOB: May 15, 1970, 46 y.o.   MRN: [id number]   Subjective:    Patient ID: Lauren Hall, female    DOB: Jun 10, 1970, 46 y.o.   MRN: [id number]  Chief Complaint  Patient presents with  . Medication Refill    Patient is here today to F/U with medications.  She ran out of Lorazepam and was initially using it to help get some sleep.  This became ineffective so after she ran out this last time she has not gotton it refilled.  Would like to discuss possible alternatives.    HPI Patient is in today for follow up. She continues to struggle with chronic pain and fatigue. She endorses arthralgias, myalgias and back pain. No recent falls or injury. No incontinence. She is struggling with significant stressors while raising her teenager. Denies CP/palp/SOB/HA/congestion/fevers/GI or GU c/o. Taking meds as prescribed  Past Medical History:  Diagnosis Date  . Allergy   . Anemia   . Anxiety   . Bronchitis 07/27/2011  . 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 Complication of anesthesia    patient woke up while they were sewing up her incision with breast lumpectomy  . Depression   . Dysrhythmia    Tachycardia  . Enlarged thyroid   . Fatigue 10/08/2010  . Fibromyalgia 2009  . GERD (gastroesophageal reflux disease) 09/26/2013  . Headache(784.0) 10/08/2010   Migraines  . Hip pain, bilateral 11/06/2010  . Hyperglycemia 08/11/2015   normal Hgb A 1C per patient  . Hypertension   . Low back pain radiating to right leg 03/11/2015  . Other and unspecified hyperlipidemia 07/29/2012  . Peripheral neuropathy 11/11/2012   burning sensation mainly right leg  . Poor concentration 01/11/2011  . Preventative health care 07/29/2012  . Sinusitis, acute 07/27/2011  . Tachycardia   . Thyroid disease 10/08/2010  . Tick bite of flank 11/09/2011  . Tinea corporis 11/11/2012  . Uterine leiomyoma     Past Surgical History:  Procedure Laterality Date  . BREAST LUMPECTOMY Right 1999   benign    . CYSTOSCOPY N/A 10/07/2015   Procedure: CYSTOSCOPY;  Surgeon: Sanjuana Kava, MD;  Location: [facility];  Service: Gynecology;  Laterality: N/A;    Family History  Problem Relation Age of Onset  . Fibromyalgia Mother   . Diabetes Mother        type 2  . Hypertension Mother   . Hyperlipidemia Mother   . Arthritis Mother   . Alcohol abuse Father   . Colon cancer Father   . Hypertension Brother   . Alcohol abuse Brother   . Heart disease Maternal Grandmother   . Hyperlipidemia Maternal Grandmother   . Diabetes Maternal Grandmother        type 2  . Colon cancer Maternal Grandmother   . Stroke Maternal Grandfather   . Diabetes Maternal Grandfather        type 2  . Ovarian cancer Paternal Grandmother   . Alzheimer's disease Paternal Grandfather     Social History   Social History  . Marital status: Married    Spouse name: Montine Circle  . Number of children: N/A  . Years of education: N/A   Occupational History  . Not on file.   Social History Main Topics  . Smoking status: Never Smoker  . Smokeless tobacco: Never Used  . Alcohol use Yes     Comment: special occassion- very rarely  . Drug use: No  . Sexual activity: Yes  Partners: Male    Birth control/ protection: None   Other Topics Concern  . Not on file   Social History Narrative  . No narrative on file    Outpatient Medications Prior to Visit  Medication Sig Dispense Refill  . atorvastatin (LIPITOR) 10 MG tablet Take 1/2 tablet by mouth daily. 15 tablet 5  . fish oil-omega-3 fatty acids 1000 MG capsule MegaRed 1 cap by mouth daily (by Schiff) (Patient taking differently: Take 1 capsule by mouth daily. MegaRed) 30 capsule 1  . gabapentin (NEURONTIN) 300 MG capsule TAKE ONE CAPSULE BY MOUTH TWICE DAILY, AND 2 CAPSULES AT BEDTIME 360 capsule 0  . levothyroxine (SYNTHROID, LEVOTHROID) 25 MCG tablet TAKE 1 TABLET(25 MCG) BY MOUTH DAILY BEFORE BREAKFAST 90 tablet 1  . lisinopril (PRINIVIL,ZESTRIL) 10 MG tablet Take 1  tablet (10 mg total) by mouth 2 (two) times daily. 180 tablet 1  . nebivolol (BYSTOLIC) 5 MG tablet Take 1 tablet (5 mg total) by mouth daily. 90 tablet 1  . omeprazole (PRILOSEC) 40 MG capsule TAKE 1 CAPSULE BY MOUTH EVERY DAY 90 capsule 0  . ondansetron (ZOFRAN) 4 MG tablet Take 1 tablet (4 mg total) by mouth every 8 (eight) hours as needed for nausea or vomiting. 20 tablet 1  . ranitidine (ZANTAC) 300 MG tablet Take 1 tablet (300 mg total) by mouth at bedtime. 90 tablet 1  . cyclobenzaprine (FLEXERIL) 5 MG tablet Take 5 mg by mouth 3 (three) times daily as needed for muscle spasms.   1  . methocarbamol (ROBAXIN) 500 MG tablet TAKE 1 TABLET(500 MG) BY MOUTH TWICE DAILY AS NEEDED FOR MUSCLE SPASMS 60 tablet 0  . venlafaxine XR (EFFEXOR-XR) 75 MG 24 hr capsule TAKE 1 CAPSULE(75 MG) BY MOUTH DAILY WITH BREAKFAST 30 capsule 0  . LORazepam (ATIVAN) 0.5 MG tablet Take 1 tablet (0.5 mg total) by mouth every 8 (eight) hours. (Patient not taking: Reported on 10/07/2016) 30 tablet 0  . ibuprofen (ADVIL,MOTRIN) 800 MG tablet Take 1 tablet (800 mg total) by mouth every 8 (eight) hours as needed (mild pain). 60 tablet 2  . lidocaine (LIDODERM) 5 % Place 2 patches onto the skin daily. Remove & Discard patch within 12 hours or as directed by MD 60 patch 1  . predniSONE (DELTASONE) 50 MG tablet Take 1 tablet (50 mg total) by mouth daily. 5 tablet 0  . venlafaxine XR (EFFEXOR-XR) 37.5 MG 24 hr capsule TAKE 1 CAPSULE(37.5 MG) BY MOUTH DAILY WITH BREAKFAST 30 capsule 5   No facility-administered medications prior to visit.     Allergies  Allergen Reactions  . Promethazine Anaphylaxis    Review of Systems  Constitutional: Positive for malaise/fatigue. Negative for fever.  HENT: Negative for congestion.   Eyes: Negative for blurred vision.  Respiratory: Negative for shortness of breath.   Cardiovascular: Negative for chest pain, palpitations and leg swelling.  Gastrointestinal: Negative for abdominal pain,  blood in stool and nausea.  Genitourinary: Negative for dysuria and frequency.  Musculoskeletal: Positive for back pain, joint pain, myalgias and neck pain. Negative for falls.  Skin: Negative for rash.  Neurological: Negative for dizziness and loss of consciousness.  Endo/Heme/Allergies: Negative for environmental allergies.  Psychiatric/Behavioral: Negative for depression. The patient is not nervous/anxious.        Objective:    Physical Exam  Constitutional: She is oriented to person, place, and time. She appears well-developed and well-nourished. No distress.  HENT:  Head: Normocephalic and atraumatic.  Nose: Nose normal.  Eyes: Right eye exhibits no discharge. Left eye exhibits no discharge.  Neck: Normal range of motion. Neck supple.  Cardiovascular: Normal rate and regular rhythm.   No murmur heard. Pulmonary/Chest: Effort normal and breath sounds normal.  Abdominal: Soft. Bowel sounds are normal. There is no tenderness.  Musculoskeletal: She exhibits no edema.  Neurological: She is alert and oriented to person, place, and time.  Skin: Skin is warm and dry.  Psychiatric: She has a normal mood and affect.  Nursing note and vitals reviewed.   BP 136/87 (BP Location: Left Arm, Patient Position: Sitting, Cuff Size: Normal)   Pulse 89   Temp 98.3 F (36.8 C) (Oral)   Ht 4\' 11"  (1.499 m)   Wt 145 lb 12.8 oz (66.1 kg)   LMP 09/02/2015 (Approximate) Comment: continous bleeding  SpO2 100%   BMI 29.45 kg/m  Wt Readings from Last 3 Encounters:  10/07/16 145 lb 12.8 oz (66.1 kg)  09/16/16 146 lb (66.2 kg)  09/09/16 146 lb (66.2 kg)     Lab Results  Component Value Date   WBC 5.8 12/08/2015   HGB 12.5 12/08/2015   HCT 37.2 12/08/2015   PLT 308.0 12/08/2015   GLUCOSE 81 12/08/2015   CHOL 257 (H) 12/08/2015   TRIG 174.0 (H) 12/08/2015   HDL 76.00 12/08/2015   LDLCALC 146 (H) 12/08/2015   ALT 14 12/08/2015   AST 16 12/08/2015   NA 140 12/08/2015   K 3.6 12/08/2015     CL 103 12/08/2015   CREATININE 0.80 12/08/2015   BUN 8 12/08/2015   CO2 31 12/08/2015   TSH 0.24 (L) 12/08/2015   HGBA1C 5.4 12/08/2015    Lab Results  Component Value Date   TSH 0.24 (L) 12/08/2015   Lab Results  Component Value Date   WBC 5.8 12/08/2015   HGB 12.5 12/08/2015   HCT 37.2 12/08/2015   MCV 88.1 12/08/2015   PLT 308.0 12/08/2015   Lab Results  Component Value Date   NA 140 12/08/2015   K 3.6 12/08/2015   CO2 31 12/08/2015   GLUCOSE 81 12/08/2015   BUN 8 12/08/2015   CREATININE 0.80 12/08/2015   BILITOT 0.3 12/08/2015   ALKPHOS 81 12/08/2015   AST 16 12/08/2015   ALT 14 12/08/2015   PROT 7.3 12/08/2015   ALBUMIN 4.5 12/08/2015   CALCIUM 9.7 12/08/2015   ANIONGAP 6 09/25/2015   GFR 98.86 12/08/2015   Lab Results  Component Value Date   CHOL 257 (H) 12/08/2015   Lab Results  Component Value Date   HDL 76.00 12/08/2015   Lab Results  Component Value Date   LDLCALC 146 (H) 12/08/2015   Lab Results  Component Value Date   TRIG 174.0 (H) 12/08/2015   Lab Results  Component Value Date   CHOLHDL 3 12/08/2015   Lab Results  Component Value Date   HGBA1C 5.4 12/08/2015       Assessment & Plan:   Problem List Items Addressed This Visit    Fibromyalgia    Struggles with daily and diffuse pain. encouraged regular exercise, adequate hydration and clean diet. No change in meds      Anemia    Increase leafy greens, consider increased lean red meat and using cast iron cookware. Continue to monitor, report any concerns      Hypothyroidism    On Levothyroxine, continue to monitor, mild goiter noted on exam  Will proceed with ultrasound of thyroid to investigate  Relevant Orders   T4, free   TSH   US THYROID   Tachycardia    RRR today      Hyperlipidemia    Encouraged heart healthy diet, increase exercise, avoid trans fats, consider a fish oil cap daily      Relevant Orders   Lipid panel   Hyperglycemia    hgba1c  acceptable, minimize simple carbs. Increase exercise as tolerated.       Relevant Orders   Hemoglobin A1c   Comprehensive metabolic panel   Nonallopathic lesion of lumbosacral region    Having right sided sciatica recently. Methocarbimol not helping. Cyclobezaprine more helpful. Add Tylenol 1000 mg twice daily, follow up with sports medicine      Relevant Orders   CBC      I have discontinued Ms. Buley's ibuprofen, lidocaine, venlafaxine XR, methocarbamol, predniSONE, and venlafaxine XR. I have also changed her cyclobenzaprine. Additionally, I am having her start on Venlafaxine HCl. Lastly, I am having her maintain her fish oil-omega-3 fatty acids, ondansetron, ranitidine, omeprazole, lisinopril, atorvastatin, levothyroxine, nebivolol, LORazepam, and gabapentin.  Meds ordered this encounter  Medications  . cyclobenzaprine (FLEXERIL) 5 MG tablet    Sig: Take 1 tablet (5 mg total) by mouth 3 (three) times daily as needed for muscle spasms.    Dispense:  60 tablet    Refill:  2  . Venlafaxine HCl 150 MG TB24    Sig: Take 1 tablet (150 mg total) by mouth daily.    Dispense:  30 each    Refill:  3     Penni Homans, MD

## 2016-10-10 NOTE — Assessment & Plan Note (Signed)
Struggles with daily and diffuse pain. encouraged regular exercise, adequate hydration and clean diet. No change in meds

## 2016-10-10 NOTE — Assessment & Plan Note (Signed)
RRR today 

## 2016-10-10 NOTE — Assessment & Plan Note (Signed)
Increase leafy greens, consider increased lean red meat and using cast iron cookware. Continue to monitor, report any concerns 

## 2016-10-28 ENCOUNTER — Other Ambulatory Visit: Payer: Self-pay | Admitting: Family Medicine

## 2016-10-28 NOTE — Telephone Encounter (Signed)
Methocarbamol is not on current med list/thx dmf

## 2016-11-01 ENCOUNTER — Other Ambulatory Visit: Payer: Self-pay | Admitting: Family Medicine

## 2016-11-03 ENCOUNTER — Other Ambulatory Visit: Payer: Self-pay | Admitting: Family Medicine

## 2016-11-05 NOTE — Telephone Encounter (Signed)
Pt was chaged to 150mg /denied 75mg /thx dmf

## 2016-11-13 ENCOUNTER — Other Ambulatory Visit: Payer: Self-pay | Admitting: Family Medicine

## 2017-01-20 ENCOUNTER — Other Ambulatory Visit: Payer: Self-pay | Admitting: Family Medicine

## 2017-01-20 DIAGNOSIS — R000 Tachycardia, unspecified: Secondary | ICD-10-CM

## 2017-01-20 DIAGNOSIS — I10 Essential (primary) hypertension: Secondary | ICD-10-CM

## 2017-01-21 ENCOUNTER — Telehealth: Payer: Self-pay | Admitting: Family Medicine

## 2017-01-21 NOTE — Telephone Encounter (Signed)
Rx for ATIVAN dated 06/25/2016 was not picked up. Document was shredded.

## 2017-01-28 DIAGNOSIS — Z0100 Encounter for examination of eyes and vision without abnormal findings: Secondary | ICD-10-CM | POA: Diagnosis not present

## 2017-02-07 ENCOUNTER — Ambulatory Visit: Payer: 59 | Admitting: Family Medicine

## 2017-02-07 ENCOUNTER — Encounter: Payer: Self-pay | Admitting: Family Medicine

## 2017-02-07 VITALS — BP 136/70 | HR 90 | Temp 98.6°F | Resp 16 | Ht 59.06 in | Wt 151.2 lb

## 2017-02-07 DIAGNOSIS — R739 Hyperglycemia, unspecified: Secondary | ICD-10-CM | POA: Diagnosis not present

## 2017-02-07 DIAGNOSIS — R5383 Other fatigue: Secondary | ICD-10-CM

## 2017-02-07 DIAGNOSIS — E782 Mixed hyperlipidemia: Secondary | ICD-10-CM

## 2017-02-07 DIAGNOSIS — E039 Hypothyroidism, unspecified: Secondary | ICD-10-CM

## 2017-02-07 DIAGNOSIS — R000 Tachycardia, unspecified: Secondary | ICD-10-CM

## 2017-02-07 DIAGNOSIS — M797 Fibromyalgia: Secondary | ICD-10-CM

## 2017-02-07 DIAGNOSIS — Z0000 Encounter for general adult medical examination without abnormal findings: Secondary | ICD-10-CM

## 2017-02-07 DIAGNOSIS — F329 Major depressive disorder, single episode, unspecified: Secondary | ICD-10-CM

## 2017-02-07 DIAGNOSIS — F32A Depression, unspecified: Secondary | ICD-10-CM

## 2017-02-07 DIAGNOSIS — R232 Flushing: Secondary | ICD-10-CM

## 2017-02-07 DIAGNOSIS — I10 Essential (primary) hypertension: Secondary | ICD-10-CM

## 2017-02-07 DIAGNOSIS — J019 Acute sinusitis, unspecified: Secondary | ICD-10-CM

## 2017-02-07 MED ORDER — CEPHALEXIN 500 MG PO CAPS: 500.0000 mg | ORAL_CAPSULE | Freq: Three times a day (TID) | ORAL | 0 refills | Status: DC

## 2017-02-07 MED ORDER — CYCLOBENZAPRINE HCL 5 MG PO TABS: 5.0000 mg | ORAL_TABLET | Freq: Three times a day (TID) | ORAL | 3 refills | Status: DC | PRN

## 2017-02-07 MED ORDER — VENLAFAXINE HCL ER 150 MG PO TB24: 150.0000 mg | ORAL_TABLET | Freq: Every day | ORAL | 1 refills | Status: DC

## 2017-02-07 MED ORDER — GABAPENTIN 300 MG PO CAPS: ORAL_CAPSULE | ORAL | 1 refills | Status: DC

## 2017-02-07 MED ORDER — VENLAFAXINE HCL ER 75 MG PO CP24: 75.0000 mg | ORAL_CAPSULE | Freq: Every day | ORAL | 0 refills | Status: DC

## 2017-02-07 NOTE — Assessment & Plan Note (Signed)
Worse in past few months encouraged small frequent meals with lean proteins and minimal carbs. No caffeine, alcohol. Exercise and get adequate sleep. Consider medication if need be

## 2017-02-07 NOTE — Assessment & Plan Note (Signed)
Encouraged clean diet, regular exercise and adequate sleep

## 2017-02-07 NOTE — Assessment & Plan Note (Signed)
Daily struggle most notable with her husband and daughter's recent health troubles.

## 2017-02-07 NOTE — Assessment & Plan Note (Addendum)
hgba1c acceptable, minimize simple carbs. Increase exercise as tolerated. Flu shot given

## 2017-02-07 NOTE — Assessment & Plan Note (Signed)
Improved on recheck. Check labs

## 2017-02-07 NOTE — Assessment & Plan Note (Signed)
Recent likely viral. Encouraged increased rest and hydration, add probiotics, zinc such as Coldeze or Xicam. Treat fevers as needed. Vitamin c 500 to 1000 mg daily, Elderberry daily, aged or black garlic and Umcka for respiratory illnesses. Umcka daily, Mucinex twice daily

## 2017-02-07 NOTE — Assessment & Plan Note (Addendum)
Worse with ill family members. Will increase velnafaxine to 150 mg in am and 75 mg in pm

## 2017-02-07 NOTE — Progress Notes (Signed)
Subjective:  I acted as a Education administrator for [employer]. Yancey Flemings, [place]   Patient ID: Lauren Hall, female    DOB: [birth year], 47 y.o.   MRN: [id number]  No chief complaint on file.   HPI  Patient is in today for annual exam.and follow up on chronic medical concerns including fibromyalgia, depression, hyperglycemia, hypertension and hyperlipidemia. She continues to struggle with fatigue and pain daily. She has been under a great deal of stress secondary to her daughter and husband's illnesses. She is doing well with ADLs. No recent febrile illness or acute hospitalizations. Denies CP/palp/SOB/HA/congestion/fevers/GI or GU c/o. Taking meds as prescribed   Patient Care Team: Mosie Lukes, MD as PCP - General (Family Medicine)   Past Medical History:  Diagnosis Date  . Allergy   . Anemia   . Anxiety   . Bronchitis 07/27/2011  . 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 Complication of anesthesia    patient woke up while they were sewing up her incision with breast lumpectomy  . Depression   . Dysrhythmia    Tachycardia  . Enlarged thyroid   . Fatigue 10/08/2010  . Fibromyalgia 2009  . GERD (gastroesophageal reflux disease) 09/26/2013  . Headache(784.0) 10/08/2010   Migraines  . Hip pain, bilateral 11/06/2010  . Hyperglycemia 08/11/2015   normal Hgb A 1C per patient  . Hypertension   . Low back pain radiating to right leg 03/11/2015  . Other and unspecified hyperlipidemia 07/29/2012  . Peripheral neuropathy 11/11/2012   burning sensation mainly right leg  . Poor concentration 01/11/2011  . Preventative health care 07/29/2012  . Sinusitis, acute 07/27/2011  . Tachycardia   . Thyroid disease 10/08/2010  . Tick bite of flank 11/09/2011  . Tinea corporis 11/11/2012  . Uterine leiomyoma     Past Surgical History:  Procedure Laterality Date  . BREAST LUMPECTOMY Right 1999   benign   . CYSTOSCOPY N/A 10/07/2015   Procedure: CYSTOSCOPY;  Surgeon: Sanjuana Kava, MD;  Location: [facility];  Service:  Gynecology;  Laterality: N/A;    Family History  Problem Relation Age of Onset  . Fibromyalgia Mother   . Diabetes Mother        type 2  . Hypertension Mother   . Hyperlipidemia Mother   . Arthritis Mother   . Alcohol abuse Father   . Colon cancer Father   . Hypertension Brother   . Alcohol abuse Brother   . Heart disease Maternal Grandmother   . Hyperlipidemia Maternal Grandmother   . Diabetes Maternal Grandmother        type 2  . Colon cancer Maternal Grandmother   . Stroke Maternal Grandfather   . Diabetes Maternal Grandfather        type 2  . Ovarian cancer Paternal Grandmother   . Alzheimer's disease Paternal Grandfather     Social History   Socioeconomic History  . Marital status: Married    Spouse name: Montine Circle  . Number of children: Not on file  . Years of education: Not on file  . Highest education level: Not on file  Social Needs  . Financial resource strain: Not on file  . Food insecurity - worry: Not on file  . Food insecurity - inability: Not on file  . Transportation needs - medical: Not on file  . Transportation needs - non-medical: Not on file  Occupational History  . Not on file  Tobacco Use  . Smoking status: Never Smoker  . Smokeless tobacco: Never Used  Substance  and Sexual Activity  . Alcohol use: Yes    Comment: special occassion- very rarely  . Drug use: No  . Sexual activity: Yes    Partners: Male    Birth control/protection: None  Other Topics Concern  . Not on file  Social History Narrative  . Not on file    Outpatient Medications Prior to Visit  Medication Sig Dispense Refill  . atorvastatin (LIPITOR) 10 MG tablet Take 1/2 tablet by mouth daily. 15 tablet 5  . BYSTOLIC 5 MG tablet TAKE 1 TABLET(5 MG) BY MOUTH DAILY 90 tablet 0  . fish oil-omega-3 fatty acids 1000 MG capsule MegaRed 1 cap by mouth daily (by Schiff) (Patient taking differently: Take 1 capsule by mouth daily. MegaRed) 30 capsule 1  . levothyroxine (SYNTHROID,  LEVOTHROID) 25 MCG tablet TAKE 1 TABLET(25 MCG) BY MOUTH DAILY BEFORE BREAKFAST 90 tablet 1  . lisinopril (PRINIVIL,ZESTRIL) 10 MG tablet Take 1 tablet (10 mg total) by mouth 2 (two) times daily. 180 tablet 1  . LORazepam (ATIVAN) 0.5 MG tablet Take 1 tablet (0.5 mg total) by mouth every 8 (eight) hours. (Patient not taking: Reported on 10/07/2016) 30 tablet 0  . nebivolol (BYSTOLIC) 5 MG tablet Take 1 tablet (5 mg total) by mouth daily. 90 tablet 1  . omeprazole (PRILOSEC) 40 MG capsule TAKE 1 CAPSULE BY MOUTH EVERY DAY 90 capsule 0  . ondansetron (ZOFRAN) 4 MG tablet Take 1 tablet (4 mg total) by mouth every 8 (eight) hours as needed for nausea or vomiting. 20 tablet 1  . ranitidine (ZANTAC) 300 MG tablet Take 1 tablet (300 mg total) by mouth at bedtime. 90 tablet 1  . atorvastatin (LIPITOR) 10 MG tablet TAKE 1/2 TABLET BY MOUTH DAILY 15 tablet 0  . cyclobenzaprine (FLEXERIL) 5 MG tablet Take 1 tablet (5 mg total) by mouth 3 (three) times daily as needed for muscle spasms. 60 tablet 2  . gabapentin (NEURONTIN) 300 MG capsule TAKE ONE CAPSULE BY MOUTH TWICE DAILY, AND 2 CAPSULES AT BEDTIME 360 capsule 0  . Venlafaxine HCl 150 MG TB24 Take 1 tablet (150 mg total) by mouth daily. 30 each 3  . venlafaxine XR (EFFEXOR-XR) 75 MG 24 hr capsule TAKE 1 CAPSULE(75 MG) BY MOUTH DAILY WITH BREAKFAST 30 capsule 0   No facility-administered medications prior to visit.     Allergies  Allergen Reactions  . Promethazine Anaphylaxis    Review of Systems  Constitutional: Positive for malaise/fatigue. Negative for chills and fever.  HENT: Negative for congestion and hearing loss.   Eyes: Negative for discharge.  Respiratory: Negative for cough, sputum production and shortness of breath.   Cardiovascular: Negative for chest pain, palpitations and leg swelling.  Gastrointestinal: Negative for abdominal pain, blood in stool, constipation, diarrhea, heartburn, nausea and vomiting.  Genitourinary: Negative for  dysuria, frequency, hematuria and urgency.  Musculoskeletal: Positive for myalgias. Negative for back pain and falls.  Skin: Negative for rash.  Neurological: Negative for dizziness, sensory change, loss of consciousness, weakness and headaches.  Endo/Heme/Allergies: Negative for environmental allergies. Does not bruise/bleed easily.  Psychiatric/Behavioral: Positive for depression. Negative for suicidal ideas. The patient is nervous/anxious. The patient does not have insomnia.        Objective:    Physical Exam  Constitutional: She is oriented to person, place, and time. She appears well-developed and well-nourished. No distress.  HENT:  Head: Normocephalic and atraumatic.  Eyes: Conjunctivae are normal.  Neck: Neck supple. No thyromegaly present.  Cardiovascular: Normal rate, regular  rhythm and normal heart sounds.  No murmur heard. Pulmonary/Chest: Effort normal and breath sounds normal. No respiratory distress.  Abdominal: Soft. Bowel sounds are normal. She exhibits no distension and no mass. There is no tenderness.  Musculoskeletal: She exhibits no edema.  Lymphadenopathy:    She has no cervical adenopathy.  Neurological: She is alert and oriented to person, place, and time.  Skin: Skin is warm and dry.  Psychiatric: She has a normal mood and affect. Her behavior is normal.    BP 136/70 (BP Location: Left Arm, Patient Position: Sitting, Cuff Size: Normal)   Pulse 90   Temp 98.6 F (37 C) (Oral)   Resp 16   Ht 4' 11.06" (1.5 m)   Wt 151 lb 3.2 oz (68.6 kg)   LMP 09/02/2015 (Approximate) Comment: continous bleeding  SpO2 98%   BMI 30.48 kg/m  Wt Readings from Last 3 Encounters:  02/07/17 151 lb 3.2 oz (68.6 kg)  10/07/16 145 lb 12.8 oz (66.1 kg)  09/16/16 146 lb (66.2 kg)   BP Readings from Last 3 Encounters:  02/07/17 136/70  10/07/16 136/87  09/16/16 140/88     Immunization History  Administered Date(s) Administered  . Influenza Whole 10/07/2010  .  Influenza,inj,Quad PF,6+ Mos 11/06/2012, 09/24/2013, 12/08/2015  . Td 02/01/2001  . Tdap 05/20/2016    Health Maintenance  Topic Date Due  . HIV Screening  11/23/1983  . INFLUENZA VACCINE  09/01/2016  . PAP SMEAR  08/06/2018  . TETANUS/TDAP  05/21/2026    Lab Results  Component Value Date   WBC 5.8 12/08/2015   HGB 12.5 12/08/2015   HCT 37.2 12/08/2015   PLT 308.0 12/08/2015   GLUCOSE 81 12/08/2015   CHOL 257 (H) 12/08/2015   TRIG 174.0 (H) 12/08/2015   HDL 76.00 12/08/2015   LDLCALC 146 (H) 12/08/2015   ALT 14 12/08/2015   AST 16 12/08/2015   NA 140 12/08/2015   K 3.6 12/08/2015   CL 103 12/08/2015   CREATININE 0.80 12/08/2015   BUN 8 12/08/2015   CO2 31 12/08/2015   TSH 0.24 (L) 12/08/2015   HGBA1C 5.4 12/08/2015    Lab Results  Component Value Date   TSH 0.24 (L) 12/08/2015   Lab Results  Component Value Date   WBC 5.8 12/08/2015   HGB 12.5 12/08/2015   HCT 37.2 12/08/2015   MCV 88.1 12/08/2015   PLT 308.0 12/08/2015   Lab Results  Component Value Date   NA 140 12/08/2015   K 3.6 12/08/2015   CO2 31 12/08/2015   GLUCOSE 81 12/08/2015   BUN 8 12/08/2015   CREATININE 0.80 12/08/2015   BILITOT 0.3 12/08/2015   ALKPHOS 81 12/08/2015   AST 16 12/08/2015   ALT 14 12/08/2015   PROT 7.3 12/08/2015   ALBUMIN 4.5 12/08/2015   CALCIUM 9.7 12/08/2015   ANIONGAP 6 09/25/2015   GFR 98.86 12/08/2015   Lab Results  Component Value Date   CHOL 257 (H) 12/08/2015   Lab Results  Component Value Date   HDL 76.00 12/08/2015   Lab Results  Component Value Date   LDLCALC 146 (H) 12/08/2015   Lab Results  Component Value Date   TRIG 174.0 (H) 12/08/2015   Lab Results  Component Value Date   CHOLHDL 3 12/08/2015   Lab Results  Component Value Date   HGBA1C 5.4 12/08/2015         Assessment & Plan:   Problem List Items Addressed This Visit  Fibromyalgia    Encouraged clean diet, regular exercise and adequate sleep      Depression    Worse  with ill family members. Will increase velnafaxine to 150 mg in am and 75 mg in pm      Relevant Medications   venlafaxine XR (EFFEXOR-XR) 75 MG 24 hr capsule   Venlafaxine HCl 150 MG TB24   Hypertension    Well controlled, no changes to meds. Encouraged heart healthy diet such as the DASH diet and exercise as tolerated.       Relevant Orders   CBC   TSH   Comprehensive metabolic panel   Fatigue    Daily struggle most notable with her husband and daughter's recent health troubles.      Hypothyroidism    On Levothyroxine, continue to monitor      Tachycardia    Improved on recheck. Check labs      Sinusitis, acute    Recent likely viral. Encouraged increased rest and hydration, add probiotics, zinc such as Coldeze or Xicam. Treat fevers as needed. Vitamin c 500 to 1000 mg daily, Elderberry daily, aged or black garlic and Umcka for respiratory illnesses. Umcka daily, Mucinex twice daily      Relevant Medications   cephALEXin (KEFLEX) 500 MG capsule   Hyperlipidemia    Encouraged heart healthy diet, increase exercise, avoid trans fats, consider a krill oil cap daily      Relevant Orders   Lipid panel   Preventative health care    Patient encouraged to maintain heart healthy diet, regular exercise, adequate sleep. Consider daily probiotics. Take medications as prescribed      Hyperglycemia    hgba1c acceptable, minimize simple carbs. Increase exercise as tolerated. Flu shot given      Relevant Orders   Hemoglobin A1c   Hot flashes    Worse in past few months encouraged small frequent meals with lean proteins and minimal carbs. No caffeine, alcohol. Exercise and get adequate sleep. Consider medication if need be         I have changed Biridiana K. Victorino's venlafaxine XR. I am also having her start on cephALEXin. Additionally, I am having her maintain her fish oil-omega-3 fatty acids, ondansetron, ranitidine, omeprazole, lisinopril, atorvastatin, levothyroxine,  nebivolol, LORazepam, BYSTOLIC, cyclobenzaprine, gabapentin, and Venlafaxine HCl.  Meds ordered this encounter  Medications  . cyclobenzaprine (FLEXERIL) 5 MG tablet    Sig: Take 1 tablet (5 mg total) by mouth 3 (three) times daily as needed for muscle spasms.    Dispense:  60 tablet    Refill:  3  . gabapentin (NEURONTIN) 300 MG capsule    Sig: TAKE ONE CAPSULE BY MOUTH TWICE DAILY, AND 2 CAPSULES AT BEDTIME    Dispense:  360 capsule    Refill:  1  . venlafaxine XR (EFFEXOR-XR) 75 MG 24 hr capsule    Sig: Take 1 capsule (75 mg total) by mouth at bedtime.    Dispense:  90 capsule    Refill:  0  . Venlafaxine HCl 150 MG TB24    Sig: Take 1 tablet (150 mg total) by mouth daily.    Dispense:  90 each    Refill:  1  . cephALEXin (KEFLEX) 500 MG capsule    Sig: Take 1 capsule (500 mg total) by mouth 3 (three) times daily.    Dispense:  30 capsule    Refill:  0    [name] served as scribe during this visit. History, Physical and Plan  performed by medical provider. Documentation and orders reviewed and attested to.  Penni Homans, MD

## 2017-02-07 NOTE — Assessment & Plan Note (Signed)
On Levothyroxine, continue to monitor 

## 2017-02-07 NOTE — Assessment & Plan Note (Signed)
Encouraged heart healthy diet, increase exercise, avoid trans fats, consider a krill oil cap daily 

## 2017-02-07 NOTE — Assessment & Plan Note (Signed)
Well controlled, no changes to meds. Encouraged heart healthy diet such as the DASH diet and exercise as tolerated.  °

## 2017-02-07 NOTE — Assessment & Plan Note (Signed)
Patient encouraged to maintain heart healthy diet, regular exercise, adequate sleep. Consider daily probiotics. Take medications as prescribed 

## 2017-02-07 NOTE — Patient Instructions (Addendum)
For the eye apply hot compresses three times daily for roughly 10 minutes  Encouraged increased rest and hydration, add probiotics, zinc such as Coldeze or Xicam. Treat fevers as needed. Vitamin c 500 to 1000 mg daily, Elderberry daily, aged or black garlic and Umcka for respiratory illnesses. Umcka daily, Mucinex twice daily   Preventive Care 40-64 Years, Female Preventive care refers to lifestyle choices and visits with your health care provider that can promote health and wellness. What does preventive care include?  A yearly physical exam. This is also called an annual well check.  Dental exams once or twice a year.  Routine eye exams. Ask your health care provider how often you should have your eyes checked.  Personal lifestyle choices, including: ? Daily care of your teeth and gums. ? Regular physical activity. ? Eating a healthy diet. ? Avoiding tobacco and drug use. ? Limiting alcohol use. ? Practicing safe sex. ? Taking low-dose aspirin daily starting at age 44. ? Taking vitamin and mineral supplements as recommended by your health care provider. What happens during an annual well check? The services and screenings done by your health care provider during your annual well check will depend on your age, overall health, lifestyle risk factors, and family history of disease. Counseling Your health care provider may ask you questions about your:  Alcohol use.  Tobacco use.  Drug use.  Emotional well-being.  Home and relationship well-being.  Sexual activity.  Eating habits.  Work and work Statistician.  Method of birth control.  Menstrual cycle.  Pregnancy history.  Screening You may have the following tests or measurements:  Height, weight, and BMI.  Blood pressure.  Lipid and cholesterol levels. These may be checked every 5 years, or more frequently if you are over 53 years old.  Skin check.  Lung cancer screening. You may have this screening  every year starting at age 55 if you have a 30-pack-year history of smoking and currently smoke or have quit within the past 15 years.  Fecal occult blood test (FOBT) of the stool. You may have this test every year starting at age 56.  Flexible sigmoidoscopy or colonoscopy. You may have a sigmoidoscopy every 5 years or a colonoscopy every 10 years starting at age 26.  Hepatitis C blood test.  Hepatitis B blood test.  Sexually transmitted disease (STD) testing.  Diabetes screening. This is done by checking your blood sugar (glucose) after you have not eaten for a while (fasting). You may have this done every 1-3 years.  Mammogram. This may be done every 1-2 years. Talk to your health care provider about when you should start having regular mammograms. This may depend on whether you have a family history of breast cancer.  BRCA-related cancer screening. This may be done if you have a family history of breast, ovarian, tubal, or peritoneal cancers.  Pelvic exam and Pap test. This may be done every 3 years starting at age 17. Starting at age 56, this may be done every 5 years if you have a Pap test in combination with an HPV test.  Bone density scan. This is done to screen for osteoporosis. You may have this scan if you are at high risk for osteoporosis.  Discuss your test results, treatment options, and if necessary, the need for more tests with your health care provider. Vaccines Your health care provider may recommend certain vaccines, such as:  Influenza vaccine. This is recommended every year.  Tetanus, diphtheria, and  acellular pertussis (Tdap, Td) vaccine. You may need a Td booster every 10 years.  Varicella vaccine. You may need this if you have not been vaccinated.  Zoster vaccine. You may need this after age 72.  Measles, mumps, and rubella (MMR) vaccine. You may need at least one dose of MMR if you were born in 1957 or later. You may also need a second dose.  Pneumococcal  13-valent conjugate (PCV13) vaccine. You may need this if you have certain conditions and were not previously vaccinated.  Pneumococcal polysaccharide (PPSV23) vaccine. You may need one or two doses if you smoke cigarettes or if you have certain conditions.  Meningococcal vaccine. You may need this if you have certain conditions.  Hepatitis A vaccine. You may need this if you have certain conditions or if you travel or work in places where you may be exposed to hepatitis A.  Hepatitis B vaccine. You may need this if you have certain conditions or if you travel or work in places where you may be exposed to hepatitis B.  Haemophilus influenzae type b (Hib) vaccine. You may need this if you have certain conditions.  Talk to your health care provider about which screenings and vaccines you need and how often you need them. This information is not intended to replace advice given to you by your health care provider. Make sure you discuss any questions you have with your health care provider. Document Released: 02/14/2015 Document Revised: 10/08/2015 Document Reviewed: 11/19/2014 Elsevier Interactive Patient Education  2018 [employer]. Viral Conjunctivitis, Adult Viral conjunctivitis is an inflammation of the clear membrane that covers the white part of your eye and the inner surface of your eyelid (conjunctiva). The inflammation is caused by a viral infection. The blood vessels in the conjunctiva become inflamed, causing the eye to become red or pink, and often itchy. Viral conjunctivitis can be easily passed from one person to another (is contagious). This condition is often called pink eye. What are the causes? This condition is caused by a virus. A virus is a type of contagious germ. It can be spread by touching objects that have been contaminated with the virus, such as doorknobs or towels. It can also be passed through droplets, such as from coughing or sneezing. What are the signs or  symptoms? Symptoms of this condition include:  Eye redness.  Tearing or watery eyes.  Itchy and irritated eyes.  Burning feeling in the eyes.  Clear drainage from the eye.  Swollen eyelids.  A gritty feeling in the eye.  Light sensitivity.  This condition often occurs with other symptoms, such as a fever, nausea, or a rash. How is this diagnosed? This condition is diagnosed with a medical history and physical exam. If you have discharge from your eye, the discharge may be tested to rule out other causes of conjunctivitis. How is this treated? Viral conjunctivitis does not respond to medicines that kill bacteria (antibiotics). Treatment for viral conjunctivitis is directed at stopping a bacterial infection from developing in addition to the viral infection. Treatment also aims to relieve your symptoms, such as itching. This may be done with antihistamine drops or other eye medicines. Rarely, steroid eye drops or antiviral medicines may be prescribed. Follow these instructions at home: Medicines   Take or apply over-the-counter and prescription medicines only as told by your health care provider.  Be very careful to avoid touching the edge of the eyelid with the eye drop bottle or ointment tube when applying medicines  to the affected eye. Being careful this way will stop you from spreading the infection to the other eye or to other people. Eye care  Avoid touching or rubbing your eyes.  Apply a warm, wet, clean washcloth to your eye for 10-20 minutes, 3-4 times per day or as told by your health care provider.  If you wear contact lenses, do not wear them until the inflammation is gone and your health care provider says it is safe to wear them again. Ask your health care provider how to sterilize or replace your contact lenses before using them again. Wear glasses until you can resume wearing contacts.  Avoid wearing eye makeup until the inflammation is gone. Throw away any old  eye cosmetics that may be contaminated.  Gently wipe away any drainage from your eye with a warm, wet washcloth or a cotton ball. General instructions  Change or wash your pillowcase every day or as told by your health care provider.  Do not share towels, pillowcases, washcloths, eye makeup, makeup brushes, contact lenses, or glasses. This may spread the infection.  Wash your hands often with soap and water. Use paper towels to dry your hands. If soap and water are not available, use hand sanitizer.  Try to avoid contact with other people for one week or as told by your health care provider. Contact a health care provider if:  Your symptoms do not improve with treatment or they get worse.  You have increased pain.  Your vision becomes blurry.  You have a fever.  You have facial pain, redness, or swelling.  You have yellow or green drainage coming from your eye.  You have new symptoms. This information is not intended to replace advice given to you by your health care provider. Make sure you discuss any questions you have with your health care provider. Document Released: 04/10/2002 Document Revised: 08/16/2015 Document Reviewed: 08/05/2015 Elsevier Interactive Patient Education  2018 [employer]. Bacterial Conjunctivitis Bacterial conjunctivitis is an infection of your conjunctiva. This is the clear membrane that covers the white part of your eye and the inner surface of your eyelid. This condition can make your eye:  Red or pink.  Itchy.  This condition is caused by bacteria. This condition spreads very easily from person to person (is contagious) and from one eye to the other eye. Follow these instructions at home: Medicines  Take or apply your antibiotic medicine as told by your doctor. Do not stop taking or applying the antibiotic even if you start to feel better.  Take or apply over-the-counter and prescription medicines only as told by your doctor.  Do not touch  your eyelid with the eye drop bottle or the ointment tube. Managing discomfort  Wipe any fluid from your eye with a warm, wet washcloth or a cotton ball.  Place a cool, clean washcloth on your eye. Do this for 10-20 minutes, 3-4 times per day. General instructions  Do not wear contact lenses until the irritation is gone. Wear glasses until your doctor says it is okay to wear contacts.  Do not wear eye makeup until your symptoms are gone. Throw away any old makeup.  Change or wash your pillowcase every day.  Do not share towels or washcloths with anyone.  Wash your hands often with soap and water. Use paper towels to dry your hands.  Do not touch or rub your eyes.  Do not drive or use heavy machinery if your vision is blurry. Contact a doctor  if:  You have a fever.  Your symptoms do not get better after 10 days. Get help right away if:  You have a fever and your symptoms suddenly get worse.  You have very bad pain when you move your eye.  Your face: ? Hurts. ? Is red. ? Is swollen.  You have sudden loss of vision. This information is not intended to replace advice given to you by your health care provider. Make sure you discuss any questions you have with your health care provider. Document Released: 10/28/2007 Document Revised: 06/26/2015 Document Reviewed: 10/31/2014 Elsevier Interactive Patient Education  2018 [employer]. Allergic Conjunctivitis A clear membrane (conjunctiva) covers the white part of your eye and the inner surface of your eyelid. Allergic conjunctivitis happens when this membrane has inflammation. This is caused by allergies. Common causes of allergic reactions (allergens)include:  Outdoor allergens, such as: ? Pollen. ? Grass and weeds. ? Mold spores.  Indoor allergens, such as: ? Dust. ? Smoke. ? Mold. ? Pet dander. ? Animal hair.  This condition can make your eye red or pink. It can also make your eye feel itchy. This condition cannot  be spread from one person to another person (is not contagious). Follow these instructions at home:  Try not to be around things that you are allergic to.  Take or apply over-the-counter and prescription medicines only as told by your doctor. These include any eye drops.  Place a cool, clean washcloth on your eye for 10-20 minutes. Do this 3-4 times a day.  Do not touch or rub your eyes.  Do not wear contact lenses until the inflammation is gone. Wear glasses instead.  Do not wear eye makeup until the inflammation is gone.  Keep all follow-up visits as told by your doctor. This is important. Contact a doctor if:  Your symptoms get worse.  Your symptoms do not get better with treatment.  You have mild eye pain.  You are sensitive to light,  You have spots or blisters on your eyes.  You have pus coming from your eye.  You have a fever. Get help right away if:  You have redness, swelling, or other symptoms in only one eye.  Your vision is blurry.  You have vision changes.  You have very bad eye pain. Summary  Allergic conjunctivitis is caused by allergies. It can make your eye red or pink, and it can make your eye feel itchy.  This condition cannot be spread from one person to another person (is not contagious).  Try not to be around things that you are allergic to.  Take or apply over-the-counter and prescription medicines only as told by your doctor. These include any eye drops.  Contact your doctor if your symptoms get worse or they do not get better with treatment. This information is not intended to replace advice given to you by your health care provider. Make sure you discuss any questions you have with your health care provider. Document Released: 07/08/2009 Document Revised: 09/12/2015 Document Reviewed: 09/12/2015 Elsevier Interactive Patient Education  2017 [employer].

## 2017-02-08 LAB — COMPREHENSIVE METABOLIC PANEL
ALT: 56 U/L — ABNORMAL HIGH (ref 0–35)
AST: 34 U/L (ref 0–37)
Albumin: 4.2 g/dL (ref 3.5–5.2)
Alkaline Phosphatase: 103 U/L (ref 39–117)
BUN: 6 mg/dL (ref 6–23)
CO2: 31 mEq/L (ref 19–32)
Calcium: 9.3 mg/dL (ref 8.4–10.5)
Chloride: 102 mEq/L (ref 96–112)
Creatinine, Ser: 0.68 mg/dL (ref 0.40–1.20)
GFR: 118.66 mL/min (ref >60.00)
GLUCOSE: 98 mg/dL (ref 70–99)
POTASSIUM: 4.3 meq/L (ref 3.5–5.1)
SODIUM: 140 meq/L (ref 135–145)
TOTAL PROTEIN: 6.7 g/dL (ref 6.0–8.3)
Total Bilirubin: 0.3 mg/dL (ref 0.2–1.2)

## 2017-02-08 LAB — CBC
HEMATOCRIT: 38.5 % (ref 36.0–46.0)
Hemoglobin: 12.9 g/dL (ref 12.0–15.0)
MCHC: 33.5 g/dL (ref 30.0–36.0)
MCV: 94.2 fl (ref 78.0–100.0)
Platelets: 321 10*3/uL (ref 150.0–400.0)
RBC: 4.08 Mil/uL (ref 3.87–5.11)
RDW: 14 % (ref 11.5–15.5)
WBC: 6.2 10*3/uL (ref 4.0–10.5)

## 2017-02-08 LAB — LIPID PANEL
CHOL/HDL RATIO: 4
CHOLESTEROL: 241 mg/dL — AB (ref 0–200)
HDL: 65.6 mg/dL (ref >39.00)
LDL CALC: 143 mg/dL — AB (ref 0–99)
NonHDL: 175.77
Triglycerides: 163 mg/dL — ABNORMAL HIGH (ref 0.0–149.0)
VLDL: 32.6 mg/dL (ref 0.0–40.0)

## 2017-02-08 LAB — HEMOGLOBIN A1C: Hgb A1c MFr Bld: 5.8 % (ref 4.6–6.5)

## 2017-02-08 LAB — TSH: TSH: 1.09 u[IU]/mL (ref 0.35–4.50)

## 2017-02-22 ENCOUNTER — Telehealth: Payer: Self-pay | Admitting: Family Medicine

## 2017-02-22 NOTE — Telephone Encounter (Signed)
Patient RX -ATIVAN was never picked up at front office. Filled date was 07/2016- [facility] 02/22/2017.

## 2017-04-03 ENCOUNTER — Other Ambulatory Visit: Payer: Self-pay | Admitting: Family Medicine

## 2017-04-07 ENCOUNTER — Other Ambulatory Visit: Payer: Self-pay | Admitting: *Deleted

## 2017-04-07 MED ORDER — VENLAFAXINE HCL ER 150 MG PO CP24: 150.0000 mg | ORAL_CAPSULE | Freq: Every day | ORAL | 0 refills | Status: DC

## 2017-04-23 ENCOUNTER — Other Ambulatory Visit: Payer: Self-pay | Admitting: Family Medicine

## 2017-05-11 ENCOUNTER — Other Ambulatory Visit: Payer: Self-pay | Admitting: Family Medicine

## 2017-05-16 ENCOUNTER — Other Ambulatory Visit: Payer: Self-pay | Admitting: Family Medicine

## 2017-06-04 ENCOUNTER — Other Ambulatory Visit: Payer: Self-pay | Admitting: Family Medicine

## 2017-07-04 ENCOUNTER — Other Ambulatory Visit: Payer: Self-pay | Admitting: Family Medicine

## 2017-07-04 DIAGNOSIS — R000 Tachycardia, unspecified: Secondary | ICD-10-CM

## 2017-07-04 DIAGNOSIS — I10 Essential (primary) hypertension: Secondary | ICD-10-CM

## 2017-08-19 ENCOUNTER — Other Ambulatory Visit: Payer: Self-pay | Admitting: Family Medicine

## 2017-08-29 DIAGNOSIS — M9901 Segmental and somatic dysfunction of cervical region: Secondary | ICD-10-CM | POA: Diagnosis not present

## 2017-08-29 DIAGNOSIS — R202 Paresthesia of skin: Secondary | ICD-10-CM | POA: Diagnosis not present

## 2017-08-29 DIAGNOSIS — G540 Brachial plexus disorders: Secondary | ICD-10-CM | POA: Diagnosis not present

## 2017-08-31 DIAGNOSIS — R202 Paresthesia of skin: Secondary | ICD-10-CM | POA: Diagnosis not present

## 2017-08-31 DIAGNOSIS — G540 Brachial plexus disorders: Secondary | ICD-10-CM | POA: Diagnosis not present

## 2017-08-31 DIAGNOSIS — M9901 Segmental and somatic dysfunction of cervical region: Secondary | ICD-10-CM | POA: Diagnosis not present

## 2017-09-02 DIAGNOSIS — M9901 Segmental and somatic dysfunction of cervical region: Secondary | ICD-10-CM | POA: Diagnosis not present

## 2017-09-02 DIAGNOSIS — R202 Paresthesia of skin: Secondary | ICD-10-CM | POA: Diagnosis not present

## 2017-09-02 DIAGNOSIS — G540 Brachial plexus disorders: Secondary | ICD-10-CM | POA: Diagnosis not present

## 2017-09-05 DIAGNOSIS — M9901 Segmental and somatic dysfunction of cervical region: Secondary | ICD-10-CM | POA: Diagnosis not present

## 2017-09-05 DIAGNOSIS — G540 Brachial plexus disorders: Secondary | ICD-10-CM | POA: Diagnosis not present

## 2017-09-05 DIAGNOSIS — R202 Paresthesia of skin: Secondary | ICD-10-CM | POA: Diagnosis not present

## 2017-09-09 DIAGNOSIS — G540 Brachial plexus disorders: Secondary | ICD-10-CM | POA: Diagnosis not present

## 2017-09-09 DIAGNOSIS — R202 Paresthesia of skin: Secondary | ICD-10-CM | POA: Diagnosis not present

## 2017-09-09 DIAGNOSIS — M9901 Segmental and somatic dysfunction of cervical region: Secondary | ICD-10-CM | POA: Diagnosis not present

## 2017-09-13 DIAGNOSIS — R202 Paresthesia of skin: Secondary | ICD-10-CM | POA: Diagnosis not present

## 2017-09-13 DIAGNOSIS — G540 Brachial plexus disorders: Secondary | ICD-10-CM | POA: Diagnosis not present

## 2017-09-13 DIAGNOSIS — M9901 Segmental and somatic dysfunction of cervical region: Secondary | ICD-10-CM | POA: Diagnosis not present

## 2017-09-16 DIAGNOSIS — G540 Brachial plexus disorders: Secondary | ICD-10-CM | POA: Diagnosis not present

## 2017-09-16 DIAGNOSIS — R202 Paresthesia of skin: Secondary | ICD-10-CM | POA: Diagnosis not present

## 2017-09-16 DIAGNOSIS — M9901 Segmental and somatic dysfunction of cervical region: Secondary | ICD-10-CM | POA: Diagnosis not present

## 2017-09-19 DIAGNOSIS — R202 Paresthesia of skin: Secondary | ICD-10-CM | POA: Diagnosis not present

## 2017-09-19 DIAGNOSIS — M9901 Segmental and somatic dysfunction of cervical region: Secondary | ICD-10-CM | POA: Diagnosis not present

## 2017-09-19 DIAGNOSIS — G540 Brachial plexus disorders: Secondary | ICD-10-CM | POA: Diagnosis not present

## 2017-09-23 DIAGNOSIS — G540 Brachial plexus disorders: Secondary | ICD-10-CM | POA: Diagnosis not present

## 2017-09-23 DIAGNOSIS — R202 Paresthesia of skin: Secondary | ICD-10-CM | POA: Diagnosis not present

## 2017-09-23 DIAGNOSIS — M9901 Segmental and somatic dysfunction of cervical region: Secondary | ICD-10-CM | POA: Diagnosis not present

## 2017-09-28 DIAGNOSIS — M9901 Segmental and somatic dysfunction of cervical region: Secondary | ICD-10-CM | POA: Diagnosis not present

## 2017-09-28 DIAGNOSIS — G540 Brachial plexus disorders: Secondary | ICD-10-CM | POA: Diagnosis not present

## 2017-09-28 DIAGNOSIS — R202 Paresthesia of skin: Secondary | ICD-10-CM | POA: Diagnosis not present

## 2017-10-03 ENCOUNTER — Telehealth: Payer: Self-pay | Admitting: Family Medicine

## 2017-10-04 NOTE — Telephone Encounter (Signed)
Pt due for follow up please call and schedule appointment.  

## 2017-10-04 NOTE — Telephone Encounter (Signed)
Left pt a message to call us back to schedule follow up appt

## 2017-10-07 DIAGNOSIS — M9901 Segmental and somatic dysfunction of cervical region: Secondary | ICD-10-CM | POA: Diagnosis not present

## 2017-10-07 DIAGNOSIS — G540 Brachial plexus disorders: Secondary | ICD-10-CM | POA: Diagnosis not present

## 2017-10-07 DIAGNOSIS — R202 Paresthesia of skin: Secondary | ICD-10-CM | POA: Diagnosis not present

## 2017-10-11 DIAGNOSIS — R202 Paresthesia of skin: Secondary | ICD-10-CM | POA: Diagnosis not present

## 2017-10-11 DIAGNOSIS — M9901 Segmental and somatic dysfunction of cervical region: Secondary | ICD-10-CM | POA: Diagnosis not present

## 2017-10-11 DIAGNOSIS — G540 Brachial plexus disorders: Secondary | ICD-10-CM | POA: Diagnosis not present

## 2017-10-27 ENCOUNTER — Ambulatory Visit: Payer: 59 | Admitting: Family Medicine

## 2017-11-01 ENCOUNTER — Ambulatory Visit: Payer: 59 | Admitting: Family Medicine

## 2017-11-01 ENCOUNTER — Encounter: Payer: Self-pay | Admitting: Family Medicine

## 2017-11-01 VITALS — BP 152/88 | HR 84 | Temp 98.8°F | Resp 18 | Wt 154.4 lb

## 2017-11-01 DIAGNOSIS — M545 Low back pain: Secondary | ICD-10-CM | POA: Diagnosis not present

## 2017-11-01 DIAGNOSIS — F329 Major depressive disorder, single episode, unspecified: Secondary | ICD-10-CM

## 2017-11-01 DIAGNOSIS — I10 Essential (primary) hypertension: Secondary | ICD-10-CM

## 2017-11-01 DIAGNOSIS — E039 Hypothyroidism, unspecified: Secondary | ICD-10-CM | POA: Diagnosis not present

## 2017-11-01 DIAGNOSIS — F32A Depression, unspecified: Secondary | ICD-10-CM

## 2017-11-01 DIAGNOSIS — E782 Mixed hyperlipidemia: Secondary | ICD-10-CM

## 2017-11-01 DIAGNOSIS — G8929 Other chronic pain: Secondary | ICD-10-CM

## 2017-11-01 DIAGNOSIS — Z23 Encounter for immunization: Secondary | ICD-10-CM

## 2017-11-01 DIAGNOSIS — R739 Hyperglycemia, unspecified: Secondary | ICD-10-CM

## 2017-11-01 MED ORDER — VENLAFAXINE HCL ER 75 MG PO CP24: ORAL_CAPSULE | ORAL | 0 refills | Status: DC

## 2017-11-01 MED ORDER — VENLAFAXINE HCL ER 150 MG PO CP24: 150.0000 mg | ORAL_CAPSULE | Freq: Every day | ORAL | 0 refills | Status: DC

## 2017-11-01 MED ORDER — CYCLOBENZAPRINE HCL 5 MG PO TABS: 5.0000 mg | ORAL_TABLET | Freq: Three times a day (TID) | ORAL | 3 refills | Status: DC | PRN

## 2017-11-01 MED ORDER — LISINOPRIL 20 MG PO TABS: 20.0000 mg | ORAL_TABLET | Freq: Every day | ORAL | 3 refills | Status: DC

## 2017-11-01 NOTE — Assessment & Plan Note (Signed)
hgba1c acceptable, minimize simple carbs. Increase exercise as tolerated.  

## 2017-11-01 NOTE — Assessment & Plan Note (Signed)
Well controlled, no changes to meds. Encouraged heart healthy diet such as the DASH diet and exercise as tolerated.  °

## 2017-11-01 NOTE — Patient Instructions (Signed)

## 2017-11-01 NOTE — Assessment & Plan Note (Signed)
Tolerating statin, encouraged heart healthy diet, avoid trans fats, minimize simple carbs and saturated fats. Increase exercise as tolerated 

## 2017-11-01 NOTE — Assessment & Plan Note (Signed)
On Levothyroxine, continue to monitor 

## 2017-11-03 ENCOUNTER — Other Ambulatory Visit: Payer: 59

## 2017-11-06 NOTE — Progress Notes (Signed)
Subjective:    Patient ID: Lauren Hall, female    DOB: 1970/06/18, 47 y.o.   MRN: [id number]  No chief complaint on file.   HPI Patient is in today for follow up and she continues to deal with a great deal of daily stress. Her daughter is struggling and fighting with her family a great deal of the time. She also has a vey ill husband. She herself continues to struggle with chronic and daily pain, fatigue and anhedonia. No suicidal ideation. Denies CP/palp/SOB/HA/congestion/fevers/GI or GU c/o. Taking meds as prescribed  Past Medical History:  Diagnosis Date  . Allergy   . Anemia   . Anxiety   . Bronchitis 07/27/2011  . 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 Complication of anesthesia    patient woke up while they were sewing up her incision with breast lumpectomy  . Depression   . Dysrhythmia    Tachycardia  . Enlarged thyroid   . Fatigue 10/08/2010  . Fibromyalgia 2009  . GERD (gastroesophageal reflux disease) 09/26/2013  . Headache(784.0) 10/08/2010   Migraines  . Hip pain, bilateral 11/06/2010  . Hyperglycemia 08/11/2015   normal Hgb A 1C per patient  . Hypertension   . Low back pain radiating to right leg 03/11/2015  . Other and unspecified hyperlipidemia 07/29/2012  . Peripheral neuropathy 11/11/2012   burning sensation mainly right leg  . Poor concentration 01/11/2011  . Preventative health care 07/29/2012  . Sinusitis, acute 07/27/2011  . Tachycardia   . Thyroid disease 10/08/2010  . Tick bite of flank 11/09/2011  . Tinea corporis 11/11/2012  . Uterine leiomyoma     Past Surgical History:  Procedure Laterality Date  . BREAST LUMPECTOMY Right 1999   benign   . CYSTOSCOPY N/A 10/07/2015   Procedure: CYSTOSCOPY;  Surgeon: Sanjuana Kava, MD;  Location: [facility];  Service: Gynecology;  Laterality: N/A;    Family History  Problem Relation Age of Onset  . Fibromyalgia Mother   . Diabetes Mother        type 2  . Hypertension Mother   . Hyperlipidemia Mother   . Arthritis  Mother   . Alcohol abuse Father   . Colon cancer Father   . Hypertension Brother   . Alcohol abuse Brother   . Heart disease Maternal Grandmother   . Hyperlipidemia Maternal Grandmother   . Diabetes Maternal Grandmother        type 2  . Colon cancer Maternal Grandmother   . Stroke Maternal Grandfather   . Diabetes Maternal Grandfather        type 2  . Ovarian cancer Paternal Grandmother   . Alzheimer's disease Paternal Grandfather     Social History   Socioeconomic History  . Marital status: Married    Spouse name: Montine Circle  . Number of children: Not on file  . Years of education: Not on file  . Highest education level: Not on file  Occupational History  . Not on file  Social Needs  . Financial resource strain: Not on file  . Food insecurity:    Worry: Not on file    Inability: Not on file  . Transportation needs:    Medical: Not on file    Non-medical: Not on file  Tobacco Use  . Smoking status: Never Smoker  . Smokeless tobacco: Never Used  Substance and Sexual Activity  . Alcohol use: Yes    Comment: special occassion- very rarely  . Drug use: No  . Sexual activity: Yes  Partners: Male    Birth control/protection: None  Lifestyle  . Physical activity:    Days per week: Not on file    Minutes per session: Not on file  . Stress: Not on file  Relationships  . Social connections:    Talks on phone: Not on file    Gets together: Not on file    Attends religious service: Not on file    Active member of club or organization: Not on file    Attends meetings of clubs or organizations: Not on file    Relationship status: Not on file  . Intimate partner violence:    Fear of current or ex partner: Not on file    Emotionally abused: Not on file    Physically abused: Not on file    Forced sexual activity: Not on file  Other Topics Concern  . Not on file  Social History Narrative  . Not on file    Outpatient Medications Prior to Visit  Medication Sig  Dispense Refill  . atorvastatin (LIPITOR) 10 MG tablet TAKE 1/2 TABLET BY MOUTH DAILY 15 tablet 0  . BYSTOLIC 5 MG tablet TAKE 1 TABLET(5 MG) BY MOUTH DAILY 90 tablet 0  . fish oil-omega-3 fatty acids 1000 MG capsule MegaRed 1 cap by mouth daily (by Schiff) (Patient taking differently: Take 1 capsule by mouth daily. MegaRed) 30 capsule 1  . gabapentin (NEURONTIN) 300 MG capsule TAKE ONE CAPSULE BY MOUTH TWICE A DAY AND 2 CAPSULES AT BEDTIME 120 capsule 2  . levothyroxine (SYNTHROID, LEVOTHROID) 25 MCG tablet TAKE 1 TABLET(25 MCG) BY MOUTH DAILY BEFORE BREAKFAST 90 tablet 1  . LORazepam (ATIVAN) 0.5 MG tablet Take 1 tablet (0.5 mg total) by mouth every 8 (eight) hours. 30 tablet 0  . omeprazole (PRILOSEC) 40 MG capsule TAKE 1 CAPSULE BY MOUTH EVERY DAY 90 capsule 0  . ondansetron (ZOFRAN) 4 MG tablet Take 1 tablet (4 mg total) by mouth every 8 (eight) hours as needed for nausea or vomiting. 20 tablet 1  . ranitidine (ZANTAC) 300 MG tablet Take 1 tablet (300 mg total) by mouth at bedtime. 90 tablet 1  . cephALEXin (KEFLEX) 500 MG capsule Take 1 capsule (500 mg total) by mouth 3 (three) times daily. 30 capsule 0  . cyclobenzaprine (FLEXERIL) 5 MG tablet Take 1 tablet (5 mg total) by mouth 3 (three) times daily as needed for muscle spasms. 60 tablet 3  . lisinopril (PRINIVIL,ZESTRIL) 10 MG tablet TAKE 1 TABLET(10 MG) BY MOUTH TWICE DAILY 180 tablet 0  . lisinopril (PRINIVIL,ZESTRIL) 10 MG tablet TAKE 1 TABLET(10 MG) BY MOUTH TWICE DAILY 180 tablet 0  . nebivolol (BYSTOLIC) 5 MG tablet Take 1 tablet (5 mg total) by mouth daily. 90 tablet 1  . venlafaxine XR (EFFEXOR-XR) 150 MG 24 hr capsule Take 1 capsule (150 mg total) by mouth daily. 90 capsule 0  . venlafaxine XR (EFFEXOR-XR) 75 MG 24 hr capsule TAKE 1 CAPSULE(75 MG) BY MOUTH AT BEDTIME 90 capsule 0   No facility-administered medications prior to visit.     Allergies  Allergen Reactions  . Promethazine Anaphylaxis    Review of Systems    Constitutional: Positive for malaise/fatigue. Negative for fever.  HENT: Negative for congestion.   Eyes: Negative for blurred vision.  Respiratory: Negative for shortness of breath.   Cardiovascular: Negative for chest pain, palpitations and leg swelling.  Gastrointestinal: Negative for abdominal pain, blood in stool and nausea.  Genitourinary: Negative for dysuria and frequency.  Musculoskeletal: Positive for back pain, joint pain and myalgias. Negative for falls.  Skin: Negative for rash.  Neurological: Negative for dizziness, loss of consciousness and headaches.  Endo/Heme/Allergies: Negative for environmental allergies.  Psychiatric/Behavioral: Positive for depression. The patient is nervous/anxious.        Objective:    Physical Exam  Constitutional: She is oriented to person, place, and time. She appears well-developed and well-nourished. No distress.  HENT:  Head: Normocephalic and atraumatic.  Nose: Nose normal.  Eyes: Right eye exhibits no discharge. Left eye exhibits no discharge.  Neck: Normal range of motion. Neck supple.  Cardiovascular: Normal rate and regular rhythm.  No murmur heard. Pulmonary/Chest: Effort normal and breath sounds normal.  Abdominal: Soft. Bowel sounds are normal. There is no tenderness.  Musculoskeletal: She exhibits no edema.  Neurological: She is alert and oriented to person, place, and time.  Skin: Skin is warm and dry.  Psychiatric: She has a normal mood and affect.  Nursing note and vitals reviewed.   BP (!) 152/88   Pulse 84   Temp 98.8 F (37.1 C) (Oral)   Resp 18   Wt 154 lb 6.4 oz (70 kg)   LMP 09/02/2015 (Approximate) Comment: continous bleeding  SpO2 97%   BMI 31.13 kg/m  Wt Readings from Last 3 Encounters:  11/01/17 154 lb 6.4 oz (70 kg)  02/07/17 151 lb 3.2 oz (68.6 kg)  10/07/16 145 lb 12.8 oz (66.1 kg)     Lab Results  Component Value Date   WBC 6.2 02/07/2017   HGB 12.9 02/07/2017   HCT 38.5 02/07/2017    PLT 321.0 02/07/2017   GLUCOSE 98 02/07/2017   CHOL 241 (H) 02/07/2017   TRIG 163.0 (H) 02/07/2017   HDL 65.60 02/07/2017   LDLCALC 143 (H) 02/07/2017   ALT 56 (H) 02/07/2017   AST 34 02/07/2017   NA 140 02/07/2017   K 4.3 02/07/2017   CL 102 02/07/2017   CREATININE 0.68 02/07/2017   BUN 6 02/07/2017   CO2 31 02/07/2017   TSH 1.09 02/07/2017   HGBA1C 5.8 02/07/2017    Lab Results  Component Value Date   TSH 1.09 02/07/2017   Lab Results  Component Value Date   WBC 6.2 02/07/2017   HGB 12.9 02/07/2017   HCT 38.5 02/07/2017   MCV 94.2 02/07/2017   PLT 321.0 02/07/2017   Lab Results  Component Value Date   NA 140 02/07/2017   K 4.3 02/07/2017   CO2 31 02/07/2017   GLUCOSE 98 02/07/2017   BUN 6 02/07/2017   CREATININE 0.68 02/07/2017   BILITOT 0.3 02/07/2017   ALKPHOS 103 02/07/2017   AST 34 02/07/2017   ALT 56 (H) 02/07/2017   PROT 6.7 02/07/2017   ALBUMIN 4.2 02/07/2017   CALCIUM 9.3 02/07/2017   ANIONGAP 6 09/25/2015   GFR 118.66 02/07/2017   Lab Results  Component Value Date   CHOL 241 (H) 02/07/2017   Lab Results  Component Value Date   HDL 65.60 02/07/2017   Lab Results  Component Value Date   LDLCALC 143 (H) 02/07/2017   Lab Results  Component Value Date   TRIG 163.0 (H) 02/07/2017   Lab Results  Component Value Date   CHOLHDL 4 02/07/2017   Lab Results  Component Value Date   HGBA1C 5.8 02/07/2017       Assessment & Plan:   Problem List Items Addressed This Visit    Depression   Relevant Medications   venlafaxine XR (  EFFEXOR-XR) 150 MG 24 hr capsule   venlafaxine XR (EFFEXOR-XR) 75 MG 24 hr capsule   Other Relevant Orders   Ambulatory referral to [facility]   Hypertension    Well controlled, no changes to meds. Encouraged heart healthy diet such as the DASH diet and exercise as tolerated.       Relevant Medications   lisinopril (PRINIVIL,ZESTRIL) 20 MG tablet   Other Relevant Orders   Comprehensive metabolic panel    CBC   Hypothyroidism    On Levothyroxine, continue to monitor      Relevant Orders   TSH   Hyperlipidemia    Tolerating statin, encouraged heart healthy diet, avoid trans fats, minimize simple carbs and saturated fats. Increase exercise as tolerated      Relevant Medications   lisinopril (PRINIVIL,ZESTRIL) 20 MG tablet   Other Relevant Orders   Lipid Profile   Hyperglycemia    hgba1c acceptable, minimize simple carbs. Increase exercise as tolerated.      Relevant Orders   Hemoglobin A1c    Other Visit Diagnoses    Chronic right-sided low back pain, unspecified whether sciatica present    -  Primary   Relevant Medications   cyclobenzaprine (FLEXERIL) 5 MG tablet   Other Relevant Orders   Ambulatory referral to Orthopedic Surgery      I have discontinued Lauren Hall's nebivolol, cephALEXin, lisinopril, and lisinopril. I am also having her start on lisinopril. Additionally, I am having her maintain her fish oil-omega-3 fatty acids, ondansetron, ranitidine, omeprazole, levothyroxine, LORazepam, atorvastatin, BYSTOLIC, gabapentin, venlafaxine XR, venlafaxine XR, and cyclobenzaprine.  Meds ordered this encounter  Medications  . venlafaxine XR (EFFEXOR-XR) 150 MG 24 hr capsule    Sig: Take 1 capsule (150 mg total) by mouth daily.    Dispense:  90 capsule    Refill:  0  . venlafaxine XR (EFFEXOR-XR) 75 MG 24 hr capsule    Sig: TAKE 1 CAPSULE(75 MG) BY MOUTH AT BEDTIME    Dispense:  90 capsule    Refill:  0  . cyclobenzaprine (FLEXERIL) 5 MG tablet    Sig: Take 1 tablet (5 mg total) by mouth 3 (three) times daily as needed for muscle spasms.    Dispense:  60 tablet    Refill:  3  . lisinopril (PRINIVIL,ZESTRIL) 20 MG tablet    Sig: Take 1 tablet (20 mg total) by mouth daily.    Dispense:  90 tablet    Refill:  3     Penni Homans, MD

## 2017-11-24 ENCOUNTER — Other Ambulatory Visit: Payer: Self-pay | Admitting: Family Medicine

## 2017-11-29 ENCOUNTER — Ambulatory Visit: Payer: 59

## 2017-12-01 ENCOUNTER — Other Ambulatory Visit: Payer: Self-pay | Admitting: Family Medicine

## 2017-12-11 ENCOUNTER — Other Ambulatory Visit: Payer: Self-pay | Admitting: Family Medicine

## 2017-12-12 NOTE — Telephone Encounter (Signed)
See refill protocol failure and advise?

## 2018-01-01 ENCOUNTER — Other Ambulatory Visit: Payer: Self-pay | Admitting: Family Medicine

## 2018-01-17 ENCOUNTER — Encounter (HOSPITAL_COMMUNITY): Payer: Self-pay | Admitting: Emergency Medicine

## 2018-01-17 ENCOUNTER — Emergency Department (HOSPITAL_COMMUNITY)
Admission: EM | Admit: 2018-01-17 | Discharge: 2018-01-17 | Disposition: A | Discharge status: Home / Self Care | Payer: 59 | Attending: Emergency Medicine | Admitting: Emergency Medicine

## 2018-01-17 DIAGNOSIS — R109 Unspecified abdominal pain: Secondary | ICD-10-CM | POA: Insufficient documentation

## 2018-01-17 DIAGNOSIS — Z5321 Procedure and treatment not carried out due to patient leaving prior to being seen by health care provider: Secondary | ICD-10-CM | POA: Insufficient documentation

## 2018-01-17 LAB — CBC
HCT: 43.5 % (ref 36.0–46.0)
HEMOGLOBIN: 14.9 g/dL (ref 12.0–15.0)
MCH: 30.4 pg (ref 26.0–34.0)
MCHC: 34.3 g/dL (ref 30.0–36.0)
MCV: 88.8 fL (ref 80.0–100.0)
Platelets: 337 10*3/uL (ref 150–400)
RBC: 4.9 MIL/uL (ref 3.87–5.11)
RDW: 12.6 % (ref 11.5–15.5)
WBC: 7.4 10*3/uL (ref 4.0–10.5)
nRBC: 0 % (ref 0.0–0.2)

## 2018-01-17 LAB — URINALYSIS, ROUTINE W REFLEX MICROSCOPIC
Bacteria, UA: NONE SEEN
Bilirubin Urine: NEGATIVE
GLUCOSE, UA: NEGATIVE mg/dL
KETONES UR: 5 mg/dL — AB
Nitrite: NEGATIVE
PH: 5 (ref 5.0–8.0)
Protein, ur: 30 mg/dL — AB
Specific Gravity, Urine: 1.024 (ref 1.005–1.030)

## 2018-01-17 LAB — COMPREHENSIVE METABOLIC PANEL
ALBUMIN: 4.5 g/dL (ref 3.5–5.0)
ALT: 24 U/L (ref 0–44)
AST: 22 U/L (ref 15–41)
Alkaline Phosphatase: 100 U/L (ref 38–126)
Anion gap: 12 (ref 5–15)
CO2: 26 mmol/L (ref 22–32)
Calcium: 9.6 mg/dL (ref 8.9–10.3)
Chloride: 104 mmol/L (ref 98–111)
Creatinine, Ser: 0.83 mg/dL (ref 0.44–1.00)
GFR calc Af Amer: >60 mL/min (ref >60)
GFR calc non Af Amer: >60 mL/min (ref >60)
Glucose, Bld: 127 mg/dL — ABNORMAL HIGH (ref 70–99)
POTASSIUM: 3.6 mmol/L (ref 3.5–5.1)
Sodium: 142 mmol/L (ref 135–145)
Total Bilirubin: 0.5 mg/dL (ref 0.3–1.2)
Total Protein: 7.8 g/dL (ref 6.5–8.1)

## 2018-01-17 LAB — I-STAT BETA HCG BLOOD, ED (MC, WL, AP ONLY): I-stat hCG, quantitative: 6.2 m[IU]/mL — ABNORMAL HIGH (ref <5)

## 2018-01-17 LAB — LIPASE, BLOOD: Lipase: 26 U/L (ref 11–51)

## 2018-01-17 MED ORDER — ONDANSETRON 4 MG PO TBDP
4.0000 mg | ORAL_TABLET | Freq: Once | ORAL | Status: AC | PRN
  Administered 2018-01-17: 4 mg via ORAL
  Filled 2018-01-17: qty 1

## 2018-01-17 NOTE — ED Triage Notes (Signed)
Pt reports abdominal pain, emesis and loose bowels X1 day.  Pt reports an inability to keep anything down in the past 24 hours.

## 2018-01-17 NOTE — ED Notes (Signed)
Called Pt for vitals no answer. 

## 2018-03-04 ENCOUNTER — Other Ambulatory Visit: Payer: Self-pay | Admitting: Family Medicine

## 2018-03-14 ENCOUNTER — Encounter: Payer: 59 | Admitting: Family Medicine

## 2018-05-02 ENCOUNTER — Encounter: Payer: Self-pay | Admitting: Family Medicine

## 2018-06-24 ENCOUNTER — Other Ambulatory Visit: Payer: Self-pay | Admitting: Family Medicine

## 2018-07-01 ENCOUNTER — Other Ambulatory Visit: Payer: Self-pay | Admitting: Family Medicine

## 2018-07-10 ENCOUNTER — Other Ambulatory Visit: Payer: Self-pay | Admitting: Family Medicine

## 2018-07-28 ENCOUNTER — Encounter: Payer: Self-pay | Admitting: Family Medicine

## 2018-07-31 ENCOUNTER — Other Ambulatory Visit: Payer: Self-pay | Admitting: Family Medicine

## 2018-07-31 MED ORDER — FAMOTIDINE 40 MG PO TABS: 40.0000 mg | ORAL_TABLET | Freq: Every evening | ORAL | 3 refills | Status: DC | PRN

## 2018-07-31 MED ORDER — CARVEDILOL 3.125 MG PO TABS: 3.1250 mg | ORAL_TABLET | Freq: Two times a day (BID) | ORAL | 3 refills | Status: DC

## 2018-08-01 NOTE — Telephone Encounter (Signed)
Error

## 2018-08-01 NOTE — Telephone Encounter (Signed)
Left message for patient to call the office back to schedule lab appt or visit with pcp

## 2018-08-02 NOTE — Telephone Encounter (Signed)
I called patient left message for patient to give me a call back

## 2018-10-02 ENCOUNTER — Other Ambulatory Visit: Payer: Self-pay | Admitting: Family Medicine

## 2018-11-01 IMAGING — DX DG CERVICAL SPINE COMPLETE 4+V
5 series · 5 of 5 positions shown · non-contrast
Comparison: October 07, 2011

CLINICAL DATA: Right upper extremity pain and numbness

EXAM:
CERVICAL SPINE - COMPLETE 4+ VIEW

[c-spine lat]
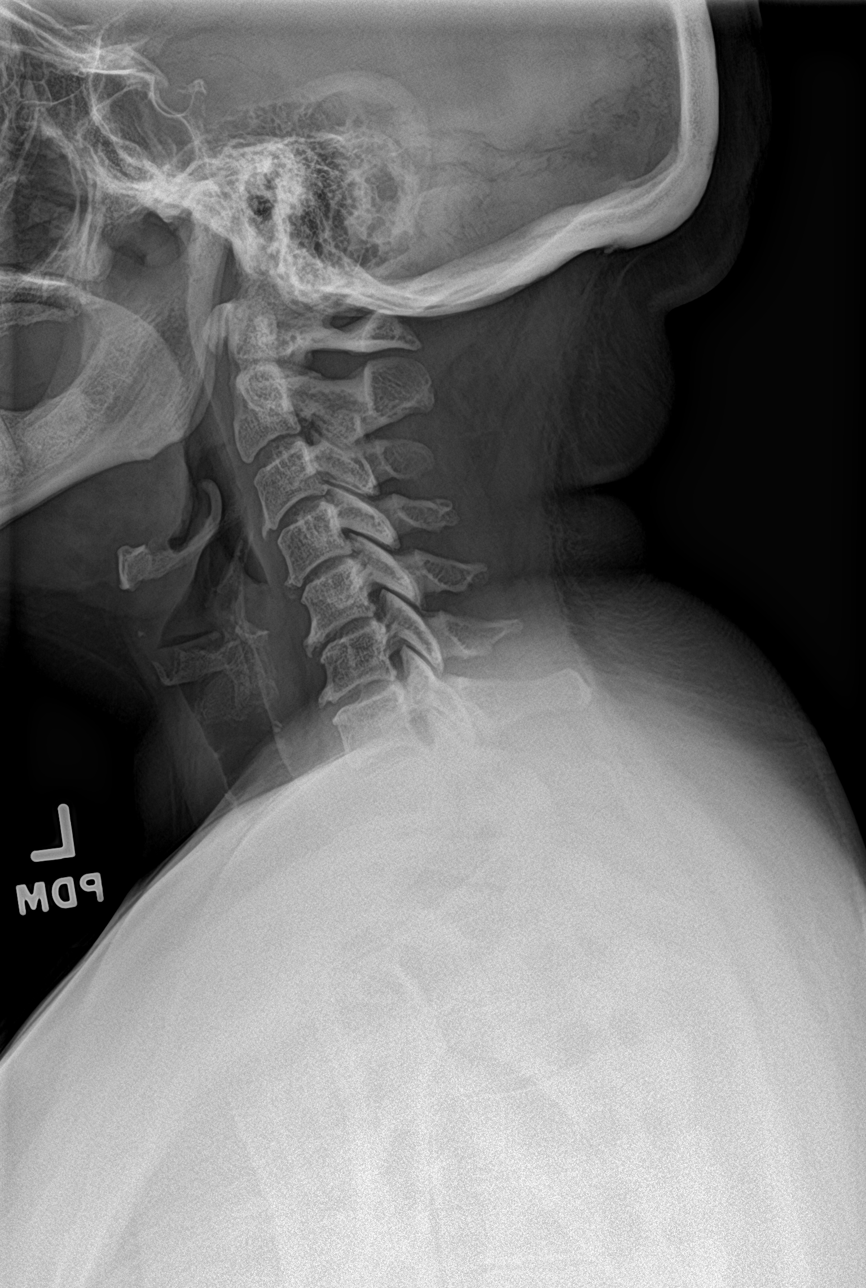

[c-spine obl (1 of 2)]
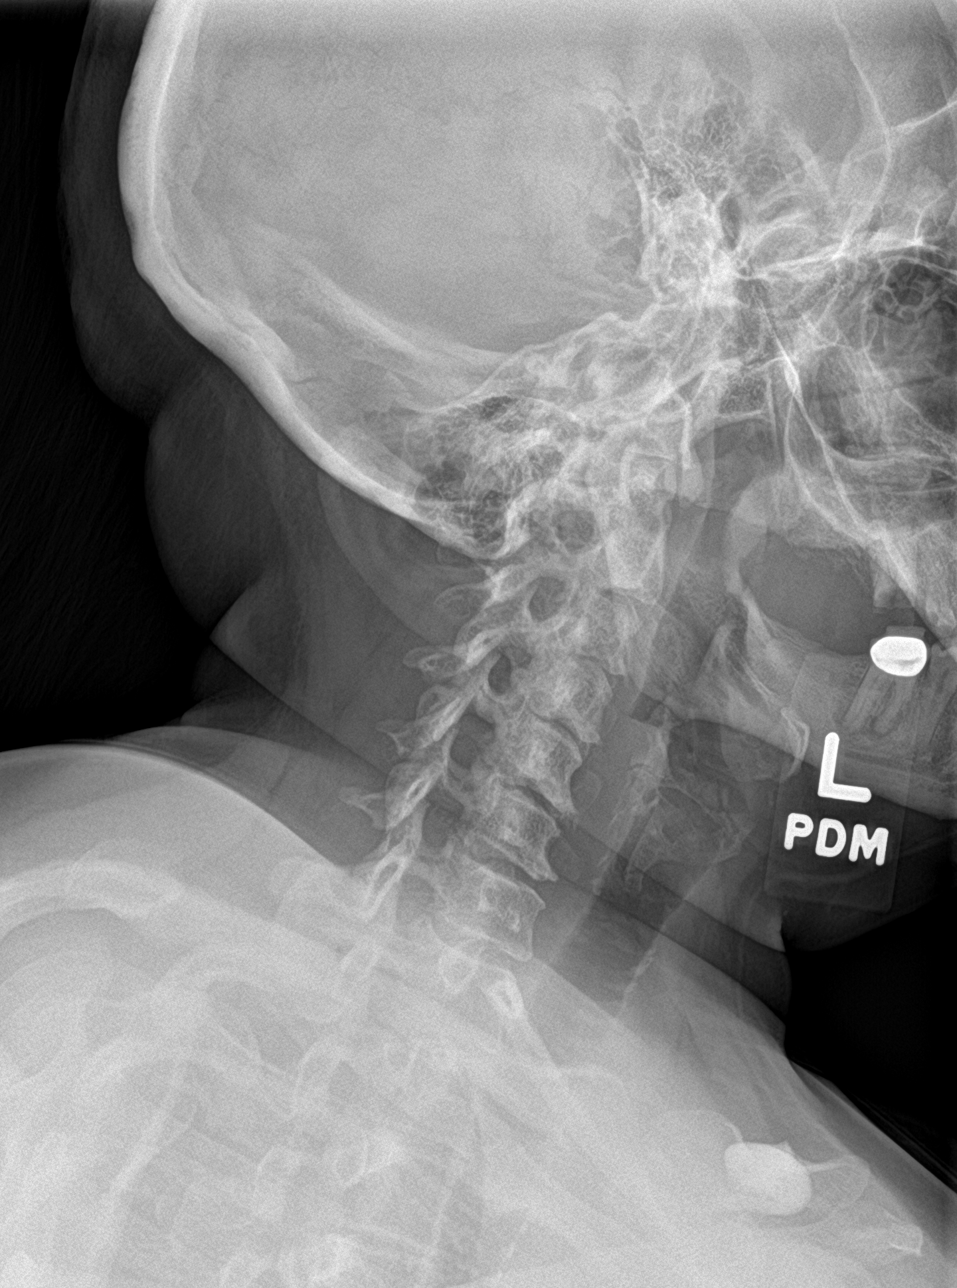

[c-spine obl (2 of 2)]
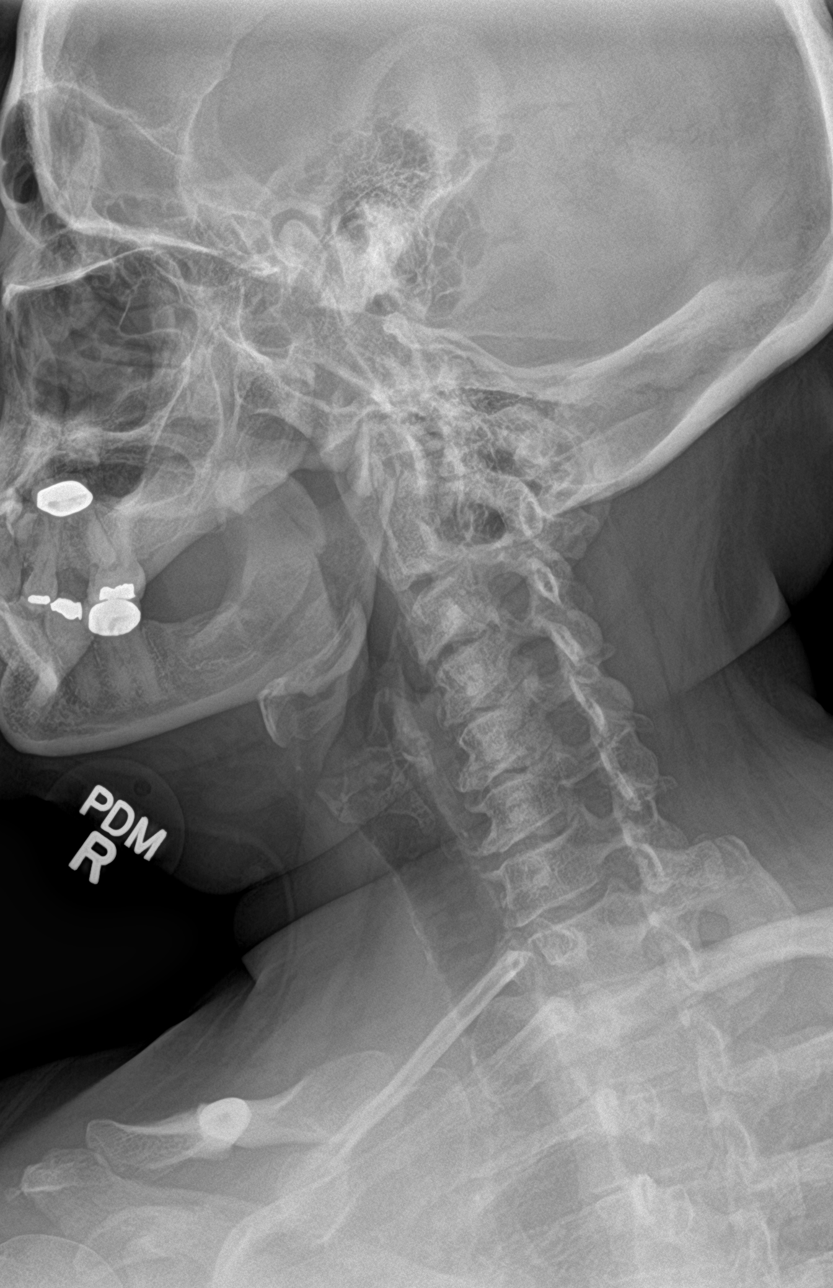

[c-spine ap]
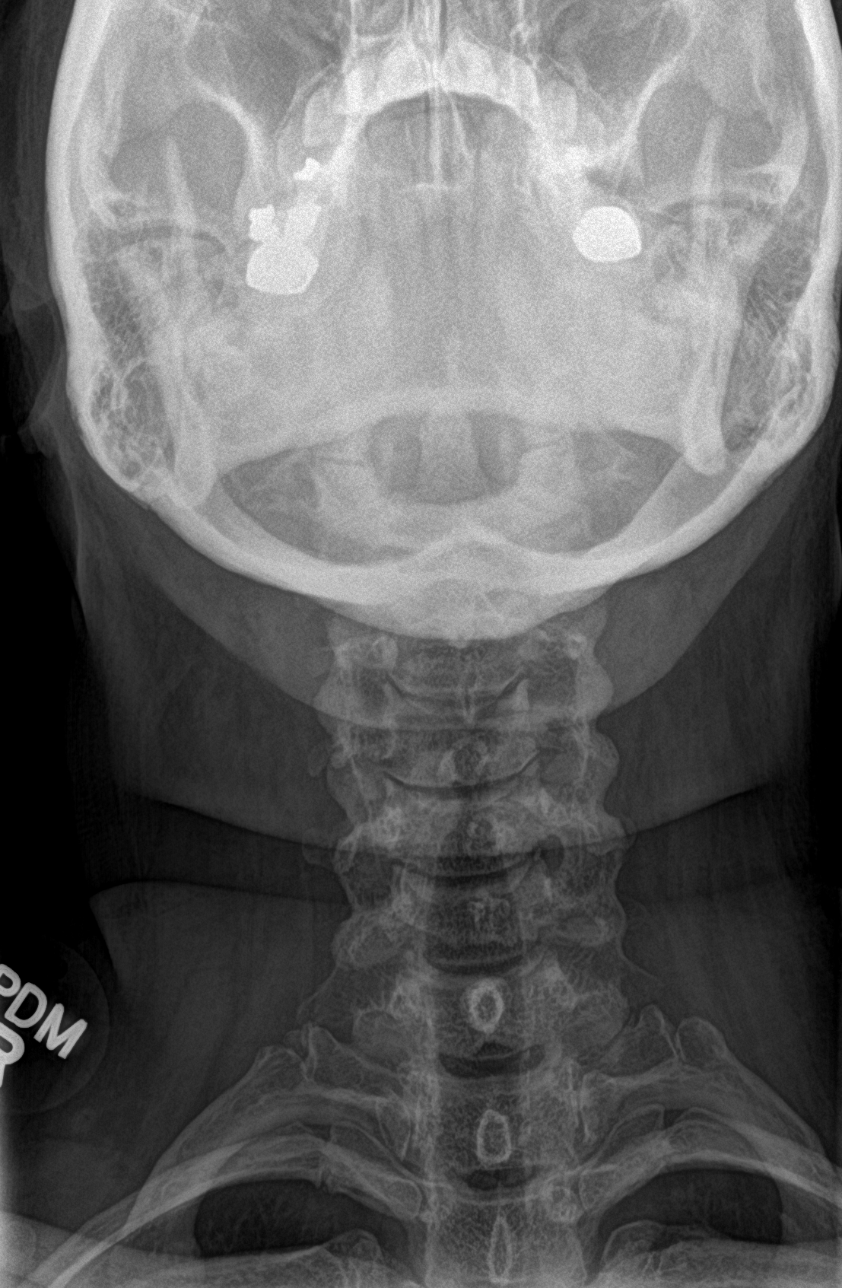

[c-spine open mouth]
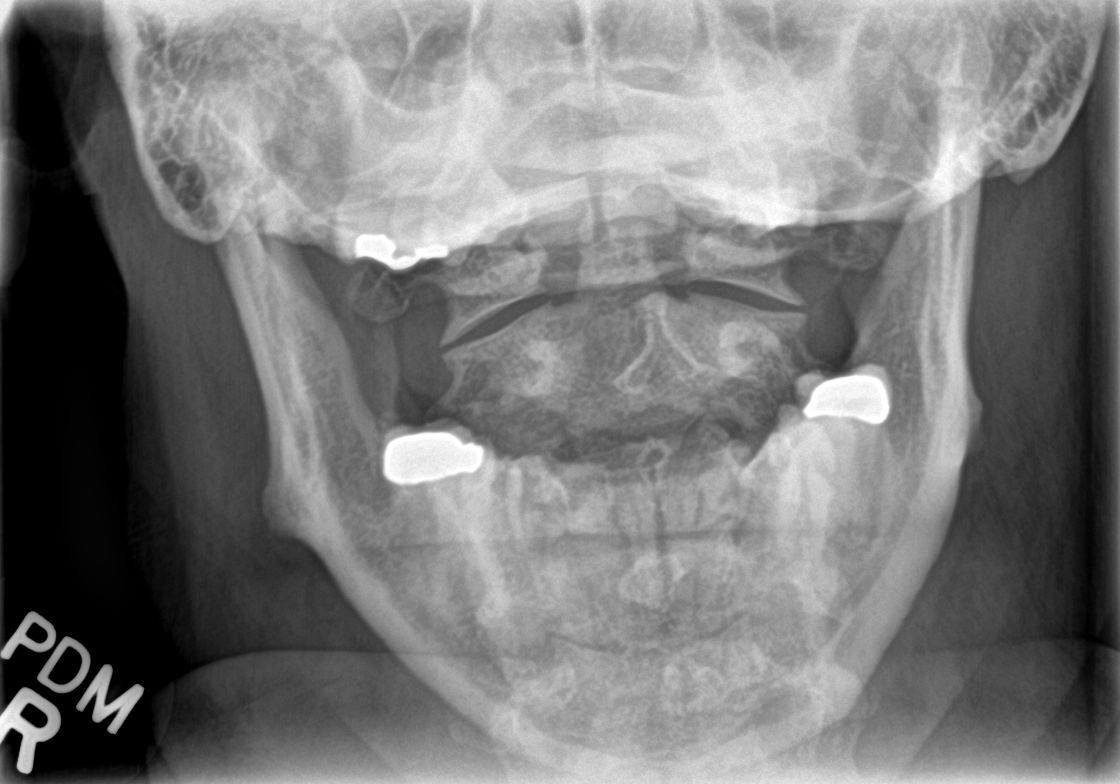

[5 of 5 positions shown; findings below may reference images not displayed]

FINDINGS: Frontal, lateral, open-mouth odontoid, and bilateral oblique views
were obtained. There is no fracture or spondylolisthesis.
Prevertebral soft tissues and predental space regions are normal.
There is moderate disc space narrowing at C4-5, C5-6, C6-7, and
C7-T1. There are anterior osteophytes at C3, C4, C5, and C6. There
is facet hypertrophy with exit foraminal narrowing at C4-5, C5-6,
and C6-7 bilaterally, most notably at C5-6 and C4-5 bilaterally.
Lung apices are clear.
IMPRESSION: Osteoarthritic change at several levels. No fracture or
spondylolisthesis.

## 2018-12-30 ENCOUNTER — Other Ambulatory Visit: Payer: Self-pay | Admitting: Family Medicine

## 2019-01-18 ENCOUNTER — Other Ambulatory Visit: Payer: Self-pay | Admitting: Family Medicine

## 2019-01-18 NOTE — Telephone Encounter (Signed)
I did a 90 day refill for her Effexor but she has not been seen in over a year so she needs at least a virtual visit before she runs out.

## 2019-01-18 NOTE — Telephone Encounter (Signed)
Last OV 11/01/17 Last refill 10/02/18 # 90/0 Next OV not scheduled  Forwarding to PCP as pt has not been seen in over 1 year

## 2019-01-22 NOTE — Telephone Encounter (Signed)
Please schedule patient a visit with pcp Please Advise

## 2019-01-23 NOTE — Telephone Encounter (Signed)
Pt is scheduled for VV on 03/27/2019.  Pt said that Carvedilol is not working well for her and asking to go back to bystolic. Please advise.  For this medication please use:  [facility] [place], [place] Phone:  [phone]  Fax:  [phone]

## 2019-01-24 ENCOUNTER — Other Ambulatory Visit: Payer: Self-pay | Admitting: Family Medicine

## 2019-01-24 DIAGNOSIS — I10 Essential (primary) hypertension: Secondary | ICD-10-CM

## 2019-01-24 DIAGNOSIS — R000 Tachycardia, unspecified: Secondary | ICD-10-CM

## 2019-01-24 MED ORDER — NEBIVOLOL HCL 5 MG PO TABS: ORAL_TABLET | ORAL | 0 refills | Status: DC

## 2019-01-24 NOTE — Telephone Encounter (Signed)
Patient wants to change back to bystolic and discontinue carvedilol

## 2019-03-27 ENCOUNTER — Ambulatory Visit (INDEPENDENT_AMBULATORY_CARE_PROVIDER_SITE_OTHER): Payer: 59 | Admitting: Family Medicine

## 2019-03-27 ENCOUNTER — Encounter: Payer: Self-pay | Admitting: Family Medicine

## 2019-03-27 ENCOUNTER — Other Ambulatory Visit: Payer: Self-pay

## 2019-03-27 DIAGNOSIS — K219 Gastro-esophageal reflux disease without esophagitis: Secondary | ICD-10-CM | POA: Diagnosis not present

## 2019-03-27 DIAGNOSIS — I10 Essential (primary) hypertension: Secondary | ICD-10-CM

## 2019-03-27 DIAGNOSIS — F32A Depression, unspecified: Secondary | ICD-10-CM

## 2019-03-27 DIAGNOSIS — R109 Unspecified abdominal pain: Secondary | ICD-10-CM

## 2019-03-27 DIAGNOSIS — R0600 Dyspnea, unspecified: Secondary | ICD-10-CM | POA: Diagnosis not present

## 2019-03-27 DIAGNOSIS — E039 Hypothyroidism, unspecified: Secondary | ICD-10-CM | POA: Diagnosis not present

## 2019-03-27 DIAGNOSIS — R000 Tachycardia, unspecified: Secondary | ICD-10-CM

## 2019-03-27 DIAGNOSIS — R739 Hyperglycemia, unspecified: Secondary | ICD-10-CM

## 2019-03-27 DIAGNOSIS — Z1211 Encounter for screening for malignant neoplasm of colon: Secondary | ICD-10-CM

## 2019-03-27 DIAGNOSIS — M797 Fibromyalgia: Secondary | ICD-10-CM

## 2019-03-27 DIAGNOSIS — E782 Mixed hyperlipidemia: Secondary | ICD-10-CM

## 2019-03-27 DIAGNOSIS — F329 Major depressive disorder, single episode, unspecified: Secondary | ICD-10-CM

## 2019-03-27 MED ORDER — GABAPENTIN 300 MG PO CAPS: 600.0000 mg | ORAL_CAPSULE | Freq: Three times a day (TID) | ORAL | 1 refills | Status: DC

## 2019-03-27 MED ORDER — NEBIVOLOL HCL 5 MG PO TABS: 5.0000 mg | ORAL_TABLET | Freq: Every day | ORAL | 1 refills | Status: DC

## 2019-03-27 MED ORDER — BUSPIRONE HCL 5 MG PO TABS: 5.0000 mg | ORAL_TABLET | Freq: Three times a day (TID) | ORAL | 0 refills | Status: DC

## 2019-03-27 MED ORDER — CYCLOBENZAPRINE HCL 10 MG PO TABS: 5.0000 mg | ORAL_TABLET | Freq: Three times a day (TID) | ORAL | 2 refills | Status: DC | PRN

## 2019-03-27 MED ORDER — OMEPRAZOLE 40 MG PO CPDR: 40.0000 mg | DELAYED_RELEASE_CAPSULE | Freq: Every day | ORAL | 1 refills | Status: DC

## 2019-03-27 NOTE — Patient Instructions (Signed)
[employer] Blood Pressure cuff, upper arm, want BP 100-140/60-90 Pulse oximeter, want oxygen in 90s  Weekly vitals  Take Multivitamin with minerals, selenium Vitamin D 1000-2000 IU daily Probiotic with lactobacillus and bifidophilus Asprin EC 81 mg daily  Melatonin 2-5 mg at bedtime  [email or web address] 

## 2019-03-28 ENCOUNTER — Telehealth: Payer: Self-pay

## 2019-03-28 NOTE — Telephone Encounter (Signed)
Bystolic no longer covered by [employer]- she must first try and fail all preferred alternatives:  Atenolol Bisoprolol Carvedilol (she has previously failed 3.125mg ) Metoprolol or metoprolol ER   Please advise.

## 2019-03-28 NOTE — Telephone Encounter (Signed)
Please let patient know and see if she wants to go back on the carvediolol or try all of the alternatives

## 2019-03-28 NOTE — Telephone Encounter (Signed)
Mychart message sent to Pt- awaiting response.

## 2019-03-30 NOTE — Assessment & Plan Note (Addendum)
Complaining of worsening reflux and abdominal pain. Referred to gastroenterology for further consideration and started on Famotidine 40 mg qhs and Omeprazole 40 mg daily. Avoid offending foods, start probiotics. Do not eat large meals in late evening and consider raising head of bed.

## 2019-03-30 NOTE — Assessment & Plan Note (Addendum)
She is very stressed with her husband on dialysis and her daughter very depressed. Feels very anxious will start Buspar 5 mg tid.and reassess. Spent over 50 minutes in counseling and alignment around next steps

## 2019-03-30 NOTE — Assessment & Plan Note (Signed)
On Levothyroxine, continue to monitor 

## 2019-03-30 NOTE — Progress Notes (Signed)
Virtual Visit via Video Note  I connected with Lauren Hall on 03/27/19 at  2:40 PM EST by a video enabled telemedicine application and verified that I am speaking with the correct person using two identifiers.  Location: Patient: home Provider: office   I discussed the limitations of evaluation and management by telemedicine and the availability of in person appointments. The patient expressed understanding and agreed to proceed. Robin Ewing, [name] was able to get the patient set up on a video visit   Subjective:    Patient ID: Lauren Hall, female    DOB: [birth year], 49 y.o.   MRN: [id number]  Chief Complaint  Patient presents with  . Follow-up    medication/fibromyalgia    HPI Patient is in today for follow up on chronic medical concerns. She is not doing well. No recent febrile illness or hospitalizations. She is noting significant anhedonia and significant anxiety. Her daughter has had some suicide attempts. Her husband has suffered kidney failure and is on dialysis and more. She is noting severe flare in her fibromyalgia due to pain. She notes fatigue and is trying to maintain quarantine. Denies CP/palp/SOB/HA/congestion/fevers/GI or GU c/o. Taking meds as prescribed  Past Medical History:  Diagnosis Date  . Allergy   . Anemia   . Anxiety   . Bronchitis 07/27/2011  . 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 Complication of anesthesia    patient woke up while they were sewing up her incision with breast lumpectomy  . Depression   . Dysrhythmia    Tachycardia  . Enlarged thyroid   . Fatigue 10/08/2010  . Fibromyalgia 2009  . GERD (gastroesophageal reflux disease) 09/26/2013  . Headache(784.0) 10/08/2010   Migraines  . Hip pain, bilateral 11/06/2010  . Hyperglycemia 08/11/2015   normal Hgb A 1C per patient  . Hypertension   . Low back pain radiating to right leg 03/11/2015  . Other and unspecified hyperlipidemia 07/29/2012  . Peripheral neuropathy 11/11/2012   burning sensation  mainly right leg  . Poor concentration 01/11/2011  . Preventative health care 07/29/2012  . Sinusitis, acute 07/27/2011  . Tachycardia   . Thyroid disease 10/08/2010  . Tick bite of flank 11/09/2011  . Tinea corporis 11/11/2012  . Uterine leiomyoma     Past Surgical History:  Procedure Laterality Date  . BREAST LUMPECTOMY Right 1999   benign   . CYSTOSCOPY N/A 10/07/2015   Procedure: CYSTOSCOPY;  Surgeon: Sanjuana Kava, MD;  Location: [facility];  Service: Gynecology;  Laterality: N/A;    Family History  Problem Relation Age of Onset  . Fibromyalgia Mother   . Diabetes Mother        type 2  . Hypertension Mother   . Hyperlipidemia Mother   . Arthritis Mother   . Alcohol abuse Father   . Colon cancer Father   . Hypertension Brother   . Alcohol abuse Brother   . Heart disease Maternal Grandmother   . Hyperlipidemia Maternal Grandmother   . Diabetes Maternal Grandmother        type 2  . Colon cancer Maternal Grandmother   . Stroke Maternal Grandfather   . Diabetes Maternal Grandfather        type 2  . Ovarian cancer Paternal Grandmother   . Alzheimer's disease Paternal Grandfather     Social History   Socioeconomic History  . Marital status: Married    Spouse name: Montine Circle  . Number of children: Not on file  . Years of education: Not on  file  . Highest education level: Not on file  Occupational History  . Not on file  Tobacco Use  . Smoking status: Never Smoker  . Smokeless tobacco: Never Used  Substance and Sexual Activity  . Alcohol use: Yes    Comment: special occassion- very rarely  . Drug use: No  . Sexual activity: Yes    Partners: Male    Birth control/protection: None  Other Topics Concern  . Not on file  Social History Narrative  . Not on file   Social Determinants of Health   Financial Resource Strain:   . Difficulty of Paying Living Expenses: Not on file  Food Insecurity:   . Worried About Charity fundraiser in the Last Year: Not on file  . Ran Out  of Food in the Last Year: Not on file  Transportation Needs:   . Lack of Transportation (Medical): Not on file  . Lack of Transportation (Non-Medical): Not on file  Physical Activity:   . Days of Exercise per Week: Not on file  . Minutes of Exercise per Session: Not on file  Stress:   . Feeling of Stress : Not on file  Social Connections:   . Frequency of Communication with Friends and Family: Not on file  . Frequency of Social Gatherings with Friends and Family: Not on file  . Attends Religious Services: Not on file  . Active Member of Clubs or Organizations: Not on file  . Attends Archivist Meetings: Not on file  . Marital Status: Not on file  Intimate Partner Violence:   . Fear of Current or Ex-Partner: Not on file  . Emotionally Abused: Not on file  . Physically Abused: Not on file  . Sexually Abused: Not on file    Outpatient Medications Prior to Visit  Medication Sig Dispense Refill  . famotidine (PEPCID) 40 MG tablet Take 1 tablet (40 mg total) by mouth at bedtime as needed for heartburn or indigestion. 30 tablet 3  . venlafaxine XR (EFFEXOR-XR) 75 MG 24 hr capsule TAKE 1 CAPSULE(75 MG) BY MOUTH AT BEDTIME 90 capsule 0  . gabapentin (NEURONTIN) 300 MG capsule TAKE 1 CAPSULE BY MOUTH TWICE A DAY AND 2 CAPSULES AT BEDTIME 360 capsule 1  . nebivolol (BYSTOLIC) 5 MG tablet TAKE 1 TABLET(5 MG) BY MOUTH DAILY 90 tablet 0  . omeprazole (PRILOSEC) 40 MG capsule TAKE 1 CAPSULE BY MOUTH EVERY DAY 90 capsule 0  . atorvastatin (LIPITOR) 10 MG tablet TAKE 1/2 TABLET BY MOUTH DAILY (Patient not taking: Reported on 03/27/2019) 15 tablet 0  . levothyroxine (SYNTHROID, LEVOTHROID) 25 MCG tablet TAKE 1 TABLET(25 MCG) BY MOUTH DAILY BEFORE BREAKFAST (Patient not taking: Reported on 03/27/2019) 90 tablet 1  . lisinopril (PRINIVIL,ZESTRIL) 10 MG tablet TAKE 1 TABLET(10 MG) BY MOUTH TWICE DAILY 180 tablet 0  . lisinopril (PRINIVIL,ZESTRIL) 20 MG tablet Take 1 tablet (20 mg total) by mouth  daily. 90 tablet 3  . ondansetron (ZOFRAN) 4 MG tablet Take 1 tablet (4 mg total) by mouth every 8 (eight) hours as needed for nausea or vomiting. (Patient not taking: Reported on 03/27/2019) 20 tablet 1  . cyclobenzaprine (FLEXERIL) 5 MG tablet Take 1 tablet (5 mg total) by mouth 3 (three) times daily as needed for muscle spasms. (Patient not taking: Reported on 03/27/2019) 60 tablet 3  . fish oil-omega-3 fatty acids 1000 MG capsule MegaRed 1 cap by mouth daily (by Schiff) (Patient not taking: Reported on 03/27/2019) 30 capsule 1  .  LORazepam (ATIVAN) 0.5 MG tablet Take 1 tablet (0.5 mg total) by mouth every 8 (eight) hours. (Patient not taking: Reported on 03/27/2019) 30 tablet 0  . venlafaxine XR (EFFEXOR-XR) 150 MG 24 hr capsule TAKE 1 CAPSULE(150 MG) BY MOUTH DAILY 90 capsule 0  . venlafaxine XR (EFFEXOR-XR) 75 MG 24 hr capsule TAKE 1 CAPSULE BY MOUTH EVERYDAY AT BEDTIME 90 capsule 0   No facility-administered medications prior to visit.    Allergies  Allergen Reactions  . Promethazine Anaphylaxis    Review of Systems  Constitutional: Positive for malaise/fatigue. Negative for fever.  HENT: Negative for congestion.   Eyes: Negative for blurred vision.  Respiratory: Negative for shortness of breath.   Cardiovascular: Positive for palpitations. Negative for chest pain and leg swelling.  Gastrointestinal: Positive for abdominal pain, heartburn and nausea. Negative for blood in stool.  Genitourinary: Negative for dysuria and frequency.  Musculoskeletal: Negative for falls.  Skin: Negative for rash.  Neurological: Negative for dizziness, loss of consciousness and headaches.  Endo/Heme/Allergies: Negative for environmental allergies.  Psychiatric/Behavioral: Positive for depression. The patient is nervous/anxious and has insomnia.        Objective:    Physical Exam  LMP 09/02/2015 (Approximate) Comment: continous bleeding Wt Readings from Last 3 Encounters:  11/01/17 154 lb 6.4 oz  (70 kg)  02/07/17 151 lb 3.2 oz (68.6 kg)  10/07/16 145 lb 12.8 oz (66.1 kg)    Diabetic Foot Exam - Simple   No data filed     Lab Results  Component Value Date   WBC 7.4 01/17/2018   HGB 14.9 01/17/2018   HCT 43.5 01/17/2018   PLT 337 01/17/2018   GLUCOSE 127 (H) 01/17/2018   CHOL 241 (H) 02/07/2017   TRIG 163.0 (H) 02/07/2017   HDL 65.60 02/07/2017   LDLCALC 143 (H) 02/07/2017   ALT 24 01/17/2018   AST 22 01/17/2018   NA 142 01/17/2018   K 3.6 01/17/2018   CL 104 01/17/2018   CREATININE 0.83 01/17/2018   BUN <5 (L) 01/17/2018   CO2 26 01/17/2018   TSH 1.09 02/07/2017   HGBA1C 5.8 02/07/2017    Lab Results  Component Value Date   TSH 1.09 02/07/2017   Lab Results  Component Value Date   WBC 7.4 01/17/2018   HGB 14.9 01/17/2018   HCT 43.5 01/17/2018   MCV 88.8 01/17/2018   PLT 337 01/17/2018   Lab Results  Component Value Date   NA 142 01/17/2018   K 3.6 01/17/2018   CO2 26 01/17/2018   GLUCOSE 127 (H) 01/17/2018   BUN <5 (L) 01/17/2018   CREATININE 0.83 01/17/2018   BILITOT 0.5 01/17/2018   ALKPHOS 100 01/17/2018   AST 22 01/17/2018   ALT 24 01/17/2018   PROT 7.8 01/17/2018   ALBUMIN 4.5 01/17/2018   CALCIUM 9.6 01/17/2018   ANIONGAP 12 01/17/2018   GFR 118.66 02/07/2017   Lab Results  Component Value Date   CHOL 241 (H) 02/07/2017   Lab Results  Component Value Date   HDL 65.60 02/07/2017   Lab Results  Component Value Date   LDLCALC 143 (H) 02/07/2017   Lab Results  Component Value Date   TRIG 163.0 (H) 02/07/2017   Lab Results  Component Value Date   CHOLHDL 4 02/07/2017   Lab Results  Component Value Date   HGBA1C 5.8 02/07/2017       Assessment & Plan:   Problem List Items Addressed This Visit    Fibromyalgia  Pain has been overwhelming. She has been out of Flexeril which helps some. Refilled Flexeril at 10 mg tid prn and increase dose of Gabapentin. She is also encouraged to hydrate, exercise and get plenty of  sleep      Relevant Medications   cyclobenzaprine (FLEXERIL) 10 MG tablet   gabapentin (NEURONTIN) 300 MG capsule   Depression    She is very stressed with her husband on dialysis and her daughter very depressed. Feels very anxious will start Buspar 5 mg tid.and reassess      Relevant Medications   busPIRone (BUSPAR) 5 MG tablet   Hypertension    Monitor and report any concerns. no changes to meds. Encouraged heart healthy diet such as the DASH diet and exercise as tolerated.       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 Will try and get it approved.       Hyperlipidemia   Relevant Medications   nebivolol (BYSTOLIC) 5 MG tablet   Other Relevant Orders   Lipid panel   GERD (gastroesophageal reflux disease)    Complaining of worsening reflux and abdominal pain. Referred to gastroenterology for further consideration and started on Famotidine 40 mg qhs and Omeprazole 40 mg daily. Avoid offending foods, start probiotics. Do not eat large meals in late evening and consider raising head of bed.       Relevant Medications   omeprazole (PRILOSEC) 40 MG capsule   Other Relevant Orders   Ambulatory referral to Gastroenterology   Hyperglycemia    hgba1c acceptable, minimize simple carbs. Increase exercise as tolerated.       Relevant Orders   Hemoglobin A1c    Other Visit Diagnoses    Dyspnea, unspecified type    -  Primary   Relevant Orders   ECHOCARDIOGRAM COMPLETE   Colon cancer screening       Relevant Orders   Ambulatory referral to Gastroenterology   Abdominal pain, unspecified abdominal location       Relevant Orders    Ambulatory referral to Gastroenterology      I have discontinued Retina K. Mccandlish's fish oil-omega-3 fatty acids, LORazepam, cyclobenzaprine, and nebivolol. I have also changed her omeprazole and gabapentin. Additionally, I am having her start on nebivolol, cyclobenzaprine, and busPIRone. Lastly, I am having her maintain her ondansetron, levothyroxine, atorvastatin, lisinopril, lisinopril, venlafaxine XR, and famotidine.  Meds ordered this encounter  Medications  . nebivolol (BYSTOLIC) 5 MG tablet    Sig: Take 1 tablet (5 mg total) by mouth daily.    Dispense:  90 tablet    Refill:  1  . cyclobenzaprine (FLEXERIL) 10 MG tablet    Sig: Take 0.5-1 tablets (5-10 mg total) by mouth 3 (three) times daily as needed for muscle spasms.    Dispense:  90 tablet    Refill:  2  . omeprazole (PRILOSEC) 40 MG capsule    Sig: Take 1 capsule (40 mg total) by mouth daily.    Dispense:  90 capsule    Refill:  1  . gabapentin (NEURONTIN) 300 MG capsule  Sig: Take 2 capsules (600 mg total) by mouth 3 (three) times daily.    Dispense:  180 capsule    Refill:  1  . busPIRone (BUSPAR) 5 MG tablet    Sig: Take 1 tablet (5 mg total) by mouth 3 (three) times daily.    Dispense:  90 tablet    Refill:  0      I discussed the assessment and treatment plan with the patient. The patient was provided an opportunity to ask questions and all were answered. The patient agreed with the plan and demonstrated an understanding of the instructions.   The patient was advised to call back or seek an in-person evaluation if the symptoms worsen or if the condition fails to improve as anticipated.  I provided 55 minutes of non-face-to-face time during this encounter.   Penni Homans, MD

## 2019-03-30 NOTE — Assessment & Plan Note (Signed)
Pain has been overwhelming. She has been out of Flexeril which helps some. Refilled Flexeril at 10 mg tid prn and increase dose of Gabapentin. She is also encouraged to hydrate, exercise and get plenty of sleep

## 2019-03-30 NOTE — Assessment & Plan Note (Signed)
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 Will try and get it approved.

## 2019-03-30 NOTE — Assessment & Plan Note (Signed)
hgba1c acceptable, minimize simple carbs. Increase exercise as tolerated.  

## 2019-03-30 NOTE — Assessment & Plan Note (Signed)
Monitor and report any concerns. no changes to meds. Encouraged heart healthy diet such as the DASH diet and exercise as tolerated.  

## 2019-04-04 ENCOUNTER — Other Ambulatory Visit: Payer: 59

## 2019-04-05 ENCOUNTER — Other Ambulatory Visit: Payer: Self-pay

## 2019-04-05 ENCOUNTER — Encounter: Payer: Self-pay | Admitting: Adult Health

## 2019-04-05 ENCOUNTER — Ambulatory Visit (INDEPENDENT_AMBULATORY_CARE_PROVIDER_SITE_OTHER): Payer: 59 | Admitting: Adult Health

## 2019-04-05 VITALS — BP 141/90 | HR 100 | Ht 59.5 in | Wt 150.0 lb

## 2019-04-05 DIAGNOSIS — F410 Panic disorder [episodic paroxysmal anxiety] without agoraphobia: Secondary | ICD-10-CM

## 2019-04-05 DIAGNOSIS — G4700 Insomnia, unspecified: Secondary | ICD-10-CM

## 2019-04-05 DIAGNOSIS — F331 Major depressive disorder, recurrent, moderate: Secondary | ICD-10-CM

## 2019-04-05 DIAGNOSIS — F411 Generalized anxiety disorder: Secondary | ICD-10-CM

## 2019-04-05 MED ORDER — LORAZEPAM 0.5 MG PO TABS: 0.5000 mg | ORAL_TABLET | Freq: Two times a day (BID) | ORAL | 0 refills | Status: DC

## 2019-04-05 MED ORDER — ARIPIPRAZOLE 2 MG PO TABS: 2.0000 mg | ORAL_TABLET | Freq: Every day | ORAL | 2 refills | Status: DC

## 2019-04-05 NOTE — Progress Notes (Signed)
[facility] MD/PA/NP Initial Note  04/05/2019 3:22 PM Lauren Hall  MRN:  [id number]  Chief Complaint:   HPI:   Describes mood today as "not the best". Pleasant. Flat. Mood symptoms - reports depression, anxiety, and irritability. More anxious overall - "can't get settled down". Stating "I feel anxious all the time". Husband on dialysis. Mother had a heart attack yesterday. Daughter - 38 with severe mental health issues. Has fibromyalgia - "feels bad all the time". Has a hard time getting out of bed. House is a mess. Feels "overwhelmed". Stating "I feel like I'm going to break". Also stating "I'm so tired of feeling like this". Was having some "ups and downs". Now just "feels a weight on her all the time". Feels like she has failed her daughter along the way. Decreased interest and motivation. Taking medications as prescribed.  Energy levels decreased. Active, does not have a regular exercise routine. 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 Enjoys some usual interests and activities. Married. Lives with husband of 20 years. Has one daughter - age 51. In-laws local. Her family is in [place]. Spending time with family. Appetite adequate. Weight gain.  Sleeps well most nights. Averages 6 to 7 hours. Focus and concentration stable. Completing tasks. Managing aspects of household. Unemployed - staying home with daughter.  Denies SI or HI. Denies AH or VH.  Previous medication trials: Pristiq, Wellbutrin, Prozac, Cymbalta, Lexapro, Zoloft,   Visit Diagnosis: No diagnosis found.  Past Psychiatric History:  Denies psychiatric hospitalization.  Past Medical History:  Past Medical History:  Diagnosis Date  . Allergy   . Anemia   . Anxiety   . Bronchitis 07/27/2011  . 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 Complication of anesthesia    patient woke up while they were sewing up her incision with breast lumpectomy  . Depression   . Dysrhythmia    Tachycardia  . Enlarged thyroid   . Fatigue 10/08/2010  .  Fibromyalgia 2009  . GERD (gastroesophageal reflux disease) 09/26/2013  . Headache(784.0) 10/08/2010   Migraines  . Hip pain, bilateral 11/06/2010  . Hyperglycemia 08/11/2015   normal Hgb A 1C per patient  . Hypertension   . Low back pain radiating to right leg 03/11/2015  . Other and unspecified hyperlipidemia 07/29/2012  . Peripheral neuropathy 11/11/2012   burning sensation mainly right leg  . Poor concentration 01/11/2011  . Preventative health care 07/29/2012  . Sinusitis, acute 07/27/2011  . Tachycardia   . Thyroid disease 10/08/2010  . Tick bite of flank 11/09/2011  . Tinea corporis 11/11/2012  . Uterine leiomyoma     Past Surgical History:  Procedure Laterality Date  . BREAST LUMPECTOMY Right 1999   benign   . CYSTOSCOPY N/A 10/07/2015   Procedure: CYSTOSCOPY;  Surgeon: Sanjuana Kava, MD;  Location: [facility];  Service: Gynecology;  Laterality: N/A;    Family Psychiatric History: Daughter 11 with depression and anxiety. Hospitalized x 2.   Family History:  Family History  Problem Relation Age of Onset  . Fibromyalgia Mother   . Diabetes Mother        type 2  . Hypertension Mother   . Hyperlipidemia Mother   . Arthritis Mother   . Alcohol abuse Father   . Colon cancer Father   . Hypertension Brother   . Alcohol abuse Brother   . Heart disease Maternal Grandmother   . Hyperlipidemia Maternal Grandmother   . Diabetes Maternal Grandmother        type 2  . Colon cancer  Maternal Grandmother   . Stroke Maternal Grandfather   . Diabetes Maternal Grandfather        type 2  . Ovarian cancer Paternal Grandmother   . Alzheimer's disease Paternal Grandfather     Social History:  Social History   Socioeconomic History  . Marital status: Married    Spouse name: Montine Circle  . Number of children: Not on file  . Years of education: Not on file  . Highest education level: Not on file  Occupational History  . Not on file  Tobacco Use  . Smoking status: Never Smoker  . Smokeless  tobacco: Never Used  Substance and Sexual Activity  . Alcohol use: Yes    Comment: special occassion- very rarely  . Drug use: No  . Sexual activity: Yes    Partners: Male    Birth control/protection: None  Other Topics Concern  . Not on file  Social History Narrative  . Not on file   Social Determinants of Health   Financial Resource Strain:   . Difficulty of Paying Living Expenses: Not on file  Food Insecurity:   . Worried About Charity fundraiser in the Last Year: Not on file  . Ran Out of Food in the Last Year: Not on file  Transportation Needs:   . Lack of Transportation (Medical): Not on file  . Lack of Transportation (Non-Medical): Not on file  Physical Activity:   . Days of Exercise per Week: Not on file  . Minutes of Exercise per Session: Not on file  Stress:   . Feeling of Stress : Not on file  Social Connections:   . Frequency of Communication with Friends and Family: Not on file  . Frequency of Social Gatherings with Friends and Family: Not on file  . Attends Religious Services: Not on file  . Active Member of Clubs or Organizations: Not on file  . Attends Archivist Meetings: Not on file  . Marital Status: Not on file    Allergies:  Allergies  Allergen Reactions  . Promethazine Anaphylaxis    Metabolic Disorder Labs: Lab Results  Component Value Date   HGBA1C 5.8 02/07/2017   No results found for: PROLACTIN Lab Results  Component Value Date   CHOL 241 (H) 02/07/2017   TRIG 163.0 (H) 02/07/2017   HDL 65.60 02/07/2017   CHOLHDL 4 02/07/2017   VLDL 32.6 02/07/2017   LDLCALC 143 (H) 02/07/2017   LDLCALC 146 (H) 12/08/2015   Lab Results  Component Value Date   TSH 1.09 02/07/2017   TSH 0.24 (L) 12/08/2015    Therapeutic Level Labs: No results found for: LITHIUM No results found for: VALPROATE No components found for:  CBMZ  Current Medications: Current Outpatient Medications  Medication Sig Dispense Refill  . atorvastatin  (LIPITOR) 10 MG tablet TAKE 1/2 TABLET BY MOUTH DAILY (Patient not taking: Reported on 03/27/2019) 15 tablet 0  . busPIRone (BUSPAR) 5 MG tablet Take 1 tablet (5 mg total) by mouth 3 (three) times daily. 90 tablet 0  . cyclobenzaprine (FLEXERIL) 10 MG tablet Take 0.5-1 tablets (5-10 mg total) by mouth 3 (three) times daily as needed for muscle spasms. 90 tablet 2  . famotidine (PEPCID) 40 MG tablet Take 1 tablet (40 mg total) by mouth at bedtime as needed for heartburn or indigestion. 30 tablet 3  . gabapentin (NEURONTIN) 300 MG capsule Take 2 capsules (600 mg total) by mouth 3 (three) times daily. 180 capsule 1  . levothyroxine (SYNTHROID,  LEVOTHROID) 25 MCG tablet TAKE 1 TABLET(25 MCG) BY MOUTH DAILY BEFORE BREAKFAST (Patient not taking: Reported on 03/27/2019) 90 tablet 1  . lisinopril (PRINIVIL,ZESTRIL) 10 MG tablet TAKE 1 TABLET(10 MG) BY MOUTH TWICE DAILY 180 tablet 0  . lisinopril (PRINIVIL,ZESTRIL) 20 MG tablet Take 1 tablet (20 mg total) by mouth daily. 90 tablet 3  . nebivolol (BYSTOLIC) 5 MG tablet Take 1 tablet (5 mg total) by mouth daily. 90 tablet 1  . omeprazole (PRILOSEC) 40 MG capsule Take 1 capsule (40 mg total) by mouth daily. 90 capsule 1  . ondansetron (ZOFRAN) 4 MG tablet Take 1 tablet (4 mg total) by mouth every 8 (eight) hours as needed for nausea or vomiting. (Patient not taking: Reported on 03/27/2019) 20 tablet 1  . venlafaxine XR (EFFEXOR-XR) 75 MG 24 hr capsule TAKE 1 CAPSULE(75 MG) BY MOUTH AT BEDTIME 90 capsule 0   No current facility-administered medications for this visit.    Medication Side Effects: none  Orders placed this visit:  No orders of the defined types were placed in this encounter.   Psychiatric Specialty Exam:  Review of Systems  Musculoskeletal: Negative for gait problem.  Neurological: Negative for tremors.  Psychiatric/Behavioral:       Please refer to HPI    Last menstrual period 09/02/2015.There is no height or weight on file to calculate  BMI.  General Appearance: Neat and Well Groomed  Eye Contact:  Good  Speech:  Clear and Coherent and Normal Rate  Volume:  Normal  Mood:  Anxious, Depressed and Irritable  Affect:  Appropriate and Congruent  Thought Process:  Coherent and Descriptions of Associations: Loose  Orientation:  Full (Time, Place, and Person)  Thought Content: Logical   Suicidal Thoughts:  No  Homicidal Thoughts:  No  Memory:  WNL  Judgement:  Good  Insight:  Good  Psychomotor Activity:  Normal  Concentration:  Concentration: Good  Recall:  Good  Fund of Knowledge: Good  Language: Good  Assets:  Communication Skills Desire for Improvement Financial Resources/Insurance Housing Intimacy Leisure Time Physical Health Resilience Social Support Talents/Skills Transportation Vocational/Educational  ADL's:  Intact  Cognition: WNL  Prognosis:  Good   Screenings: None  Receiving Psychotherapy: No Appointment scheduled for this month.  Treatment Plan/Recommendations:   Plan:  PDMP reviewed  1. Buspar 5mg  TID x 1 week ago 2. Effexor XR 75mg  daily x 2 years 3. Add Abilify 2 mg daily 4. Add Ativan 0.5mg  twice daily  Set up to see therapist later this month  RTC 4 weeks  Patient advised to contact office with any questions, adverse effects, or acute worsening in signs and symptoms.  Discussed potential benefits, risk, and side effects of benzodiazepines to include potential risk of tolerance and dependence, as well as possible drowsiness.  Advised patient not to drive if experiencing drowsiness and to take lowest possible effective dose to minimize risk of dependence and tolerance.  Discussed potential metabolic side effects associated with atypical antipsychotics, as well as potential risk for movement side effects. Advised pt to contact office if movement side effects occur.   Aloha Gell, NP

## 2019-04-10 ENCOUNTER — Ambulatory Visit (HOSPITAL_BASED_OUTPATIENT_CLINIC_OR_DEPARTMENT_OTHER): Admission: RE | Admit: 2019-04-10 | Payer: 59 | Source: Ambulatory Visit

## 2019-04-19 ENCOUNTER — Encounter: Payer: Self-pay | Admitting: Family Medicine

## 2019-04-20 ENCOUNTER — Ambulatory Visit: Payer: 59 | Admitting: Gastroenterology

## 2019-04-23 ENCOUNTER — Ambulatory Visit (INDEPENDENT_AMBULATORY_CARE_PROVIDER_SITE_OTHER): Payer: 59 | Admitting: Psychology

## 2019-04-23 ENCOUNTER — Other Ambulatory Visit: Payer: Self-pay | Admitting: Family Medicine

## 2019-04-23 DIAGNOSIS — F411 Generalized anxiety disorder: Secondary | ICD-10-CM

## 2019-04-24 ENCOUNTER — Encounter: Payer: Self-pay | Admitting: Family Medicine

## 2019-05-01 ENCOUNTER — Ambulatory Visit (INDEPENDENT_AMBULATORY_CARE_PROVIDER_SITE_OTHER): Payer: 59 | Admitting: Family Medicine

## 2019-05-01 ENCOUNTER — Other Ambulatory Visit: Payer: Self-pay

## 2019-05-01 VITALS — HR 87 | Wt 154.2 lb

## 2019-05-01 DIAGNOSIS — E039 Hypothyroidism, unspecified: Secondary | ICD-10-CM

## 2019-05-01 DIAGNOSIS — E782 Mixed hyperlipidemia: Secondary | ICD-10-CM | POA: Diagnosis not present

## 2019-05-01 DIAGNOSIS — I10 Essential (primary) hypertension: Secondary | ICD-10-CM | POA: Diagnosis not present

## 2019-05-01 DIAGNOSIS — R739 Hyperglycemia, unspecified: Secondary | ICD-10-CM

## 2019-05-01 DIAGNOSIS — R000 Tachycardia, unspecified: Secondary | ICD-10-CM

## 2019-05-01 DIAGNOSIS — F329 Major depressive disorder, single episode, unspecified: Secondary | ICD-10-CM

## 2019-05-01 DIAGNOSIS — F32A Depression, unspecified: Secondary | ICD-10-CM

## 2019-05-01 DIAGNOSIS — F4321 Adjustment disorder with depressed mood: Secondary | ICD-10-CM

## 2019-05-01 MED ORDER — LEVOTHYROXINE SODIUM 25 MCG PO TABS: ORAL_TABLET | ORAL | 1 refills | Status: DC

## 2019-05-01 MED ORDER — ATORVASTATIN CALCIUM 10 MG PO TABS: 5.0000 mg | ORAL_TABLET | Freq: Every day | ORAL | 1 refills | Status: DC

## 2019-05-02 NOTE — Progress Notes (Signed)
Virtual Visit via Video Note  I connected with Lauren Hall on 05/01/19 at  3:40 PM EDT by a video enabled telemedicine application and verified that I am speaking with the correct person using two identifiers.  Location: Patient: home Provider: office   I discussed the limitations of evaluation and management by telemedicine and the availability of in person appointments. The patient expressed understanding and agreed to proceed. Kem Boroughs, [name] was able to get the patient setup on a video visit.    Subjective:    Patient ID: Lauren Hall, female    DOB: 05-02-1970, 49 y.o.   MRN: [id number]  Chief Complaint  Patient presents with  . Anxiety  . Stress  . Medication Management    lisinopril 10 or 20?    HPI Patient is in today for follow up on chronic medical concerns. No recent febrile illness or hospitalizations. She is very tearful and shakey secondary to her mother just dying from complications after open heart surgery. she is following with psychiatry and behavioral health. Denies CP/palp/SOB/HA/congestion/fevers/GI or GU c/o. Taking meds as prescribed. She notes fatigue and chronic pain  Past Medical History:  Diagnosis Date  . Allergy   . Anemia   . Anxiety   . Bronchitis 07/27/2011  . 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 Complication of anesthesia    patient woke up while they were sewing up her incision with breast lumpectomy  . Depression   . Dysrhythmia    Tachycardia  . Enlarged thyroid   . Fatigue 10/08/2010  . Fibromyalgia 2009  . GERD (gastroesophageal reflux disease) 09/26/2013  . Headache(784.0) 10/08/2010   Migraines  . Hip pain, bilateral 11/06/2010  . Hyperglycemia 08/11/2015   normal Hgb A 1C per patient  . Hypertension   . Low back pain radiating to right leg 03/11/2015  . Other and unspecified hyperlipidemia 07/29/2012  . Peripheral neuropathy 11/11/2012   burning sensation mainly right leg  . Poor concentration 01/11/2011  . Preventative  health care 07/29/2012  . Sinusitis, acute 07/27/2011  . Tachycardia   . Thyroid disease 10/08/2010  . Tick bite of flank 11/09/2011  . Tinea corporis 11/11/2012  . Uterine leiomyoma     Past Surgical History:  Procedure Laterality Date  . BREAST LUMPECTOMY Right 1999   benign   . CYSTOSCOPY N/A 10/07/2015   Procedure: CYSTOSCOPY;  Surgeon: Sanjuana Kava, MD;  Location: [facility];  Service: Gynecology;  Laterality: N/A;    Family History  Problem Relation Age of Onset  . Fibromyalgia Mother   . Diabetes Mother        type 2  . Hypertension Mother   . Hyperlipidemia Mother   . Arthritis Mother   . Alcohol abuse Father   . Colon cancer Father   . Hypertension Brother   . Alcohol abuse Brother   . Heart disease Maternal Grandmother   . Hyperlipidemia Maternal Grandmother   . Diabetes Maternal Grandmother        type 2  . Colon cancer Maternal Grandmother   . Stroke Maternal Grandfather   . Diabetes Maternal Grandfather        type 2  . Ovarian cancer Paternal Grandmother   . Alzheimer's disease Paternal Grandfather     Social History   Socioeconomic History  . Marital status: Married    Spouse name: Montine Circle  . Number of children: Not on file  . Years of education: Not on file  . Highest education level: Not on file  Occupational History  . Not on file  Tobacco Use  . Smoking status: Never Smoker  . Smokeless tobacco: Never Used  Substance and Sexual Activity  . Alcohol use: Yes    Comment: special occassion- very rarely  . Drug use: No  . Sexual activity: Yes    Partners: Male    Birth control/protection: None  Other Topics Concern  . Not on file  Social History Narrative  . Not on file   Social Determinants of Health   Financial Resource Strain:   . Difficulty of Paying Living Expenses:   Food Insecurity:   . Worried About Charity fundraiser in the Last Year:   . Arboriculturist in the Last Year:   Transportation Needs:   . Film/video editor (Medical):    Marland Kitchen Lack of Transportation (Non-Medical):   Physical Activity:   . Days of Exercise per Week:   . Minutes of Exercise per Session:   Stress:   . Feeling of Stress :   Social Connections:   . Frequency of Communication with Friends and Family:   . Frequency of Social Gatherings with Friends and Family:   . Attends Religious Services:   . Active Member of Clubs or Organizations:   . Attends Archivist Meetings:   Marland Kitchen Marital Status:   Intimate Partner Violence:   . Fear of Current or Ex-Partner:   . Emotionally Abused:   Marland Kitchen Physically Abused:   . Sexually Abused:     Outpatient Medications Prior to Visit  Medication Sig Dispense Refill  . ARIPiprazole (ABILIFY) 2 MG tablet Take 1 tablet (2 mg total) by mouth daily. 30 tablet 2  . busPIRone (BUSPAR) 5 MG tablet Take 1 tablet (5 mg total) by mouth 3 (three) times daily. 90 tablet 0  . carvedilol (COREG) 25 MG tablet Take 25 mg by mouth 2 (two) times daily with a meal.    . cyclobenzaprine (FLEXERIL) 10 MG tablet Take 0.5-1 tablets (5-10 mg total) by mouth 3 (three) times daily as needed for muscle spasms. 90 tablet 2  . gabapentin (NEURONTIN) 300 MG capsule Take 2 capsules (600 mg total) by mouth 3 (three) times daily. 180 capsule 1  . LORazepam (ATIVAN) 0.5 MG tablet Take 1 tablet (0.5 mg total) by mouth 2 (two) times daily. 30 tablet 0  . omeprazole (PRILOSEC) 40 MG capsule Take 1 capsule (40 mg total) by mouth daily. 90 capsule 1  . venlafaxine XR (EFFEXOR-XR) 75 MG 24 hr capsule TAKE 1 CAPSULE BY MOUTH EVERYDAY AT BEDTIME 90 capsule 0  . atorvastatin (LIPITOR) 10 MG tablet TAKE 1/2 TABLET BY MOUTH DAILY 15 tablet 0  . levothyroxine (SYNTHROID, LEVOTHROID) 25 MCG tablet TAKE 1 TABLET(25 MCG) BY MOUTH DAILY BEFORE BREAKFAST 90 tablet 1  . lisinopril (PRINIVIL,ZESTRIL) 10 MG tablet TAKE 1 TABLET(10 MG) BY MOUTH TWICE DAILY 180 tablet 0  . lisinopril (PRINIVIL,ZESTRIL) 20 MG tablet Take 1 tablet (20 mg total) by mouth daily. 90  tablet 3  . ondansetron (ZOFRAN) 4 MG tablet Take 1 tablet (4 mg total) by mouth every 8 (eight) hours as needed for nausea or vomiting. (Patient not taking: Reported on 03/27/2019) 20 tablet 1  . famotidine (PEPCID) 40 MG tablet Take 1 tablet (40 mg total) by mouth at bedtime as needed for heartburn or indigestion. 30 tablet 3  . nebivolol (BYSTOLIC) 5 MG tablet Take 1 tablet (5 mg total) by mouth daily. 90 tablet 1   No facility-administered  medications prior to visit.    Allergies  Allergen Reactions  . Promethazine Anaphylaxis    Review of Systems  Constitutional: Positive for malaise/fatigue. Negative for fever.  HENT: Negative for congestion.   Eyes: Negative for blurred vision.  Respiratory: Negative for shortness of breath.   Cardiovascular: Negative for chest pain, palpitations and leg swelling.  Gastrointestinal: Negative for abdominal pain, blood in stool and nausea.  Genitourinary: Negative for dysuria and frequency.  Musculoskeletal: Positive for back pain, joint pain and myalgias. Negative for falls.  Skin: Negative for rash.  Neurological: Negative for dizziness, loss of consciousness and headaches.  Endo/Heme/Allergies: Negative for environmental allergies.  Psychiatric/Behavioral: Positive for depression. Negative for hallucinations and substance abuse. The patient is nervous/anxious and has insomnia.        Objective:    Physical Exam Constitutional:      Appearance: Normal appearance. She is not ill-appearing.  HENT:     Head: Normocephalic and atraumatic.     Right Ear: External ear normal.     Left Ear: External ear normal.  Eyes:     General:        Right eye: No discharge.        Left eye: No discharge.  Pulmonary:     Effort: Pulmonary effort is normal.  Neurological:     Mental Status: She is alert and oriented to person, place, and time.  Psychiatric:     Comments: Very tearful and anxious.     Pulse 87   Wt 154 lb 3.2 oz (69.9 kg)   LMP  09/02/2015 (Approximate) Comment: continous bleeding  SpO2 98%   BMI 30.62 kg/m  Wt Readings from Last 3 Encounters:  05/01/19 154 lb 3.2 oz (69.9 kg)  11/01/17 154 lb 6.4 oz (70 kg)  02/07/17 151 lb 3.2 oz (68.6 kg)    Diabetic Foot Exam - Simple   No data filed     Lab Results  Component Value Date   WBC 7.4 01/17/2018   HGB 14.9 01/17/2018   HCT 43.5 01/17/2018   PLT 337 01/17/2018   GLUCOSE 127 (H) 01/17/2018   CHOL 241 (H) 02/07/2017   TRIG 163.0 (H) 02/07/2017   HDL 65.60 02/07/2017   LDLCALC 143 (H) 02/07/2017   ALT 24 01/17/2018   AST 22 01/17/2018   NA 142 01/17/2018   K 3.6 01/17/2018   CL 104 01/17/2018   CREATININE 0.83 01/17/2018   BUN <5 (L) 01/17/2018   CO2 26 01/17/2018   TSH 1.09 02/07/2017   HGBA1C 5.8 02/07/2017    Lab Results  Component Value Date   TSH 1.09 02/07/2017   Lab Results  Component Value Date   WBC 7.4 01/17/2018   HGB 14.9 01/17/2018   HCT 43.5 01/17/2018   MCV 88.8 01/17/2018   PLT 337 01/17/2018   Lab Results  Component Value Date   NA 142 01/17/2018   K 3.6 01/17/2018   CO2 26 01/17/2018   GLUCOSE 127 (H) 01/17/2018   BUN <5 (L) 01/17/2018   CREATININE 0.83 01/17/2018   BILITOT 0.5 01/17/2018   ALKPHOS 100 01/17/2018   AST 22 01/17/2018   ALT 24 01/17/2018   PROT 7.8 01/17/2018   ALBUMIN 4.5 01/17/2018   CALCIUM 9.6 01/17/2018   ANIONGAP 12 01/17/2018   GFR 118.66 02/07/2017   Lab Results  Component Value Date   CHOL 241 (H) 02/07/2017   Lab Results  Component Value Date   HDL 65.60 02/07/2017   Lab  Results  Component Value Date   LDLCALC 143 (H) 02/07/2017   Lab Results  Component Value Date   TRIG 163.0 (H) 02/07/2017   Lab Results  Component Value Date   CHOLHDL 4 02/07/2017   Lab Results  Component Value Date   HGBA1C 5.8 02/07/2017       Assessment & Plan:   Problem List Items Addressed This Visit    Depression    Psychiatry has added Abilify and she is tolerating it.        Hypertension - Primary    Monitor and report any concerns, no changes to meds. Encouraged heart healthy diet such as the DASH diet and exercise as tolerated.       Relevant Medications   carvedilol (COREG) 25 MG tablet   atorvastatin (LIPITOR) 10 MG tablet   Other Relevant Orders   Ambulatory referral to Cardiology   Hypothyroidism    On Levothyroxine, continue to monitor      Relevant Medications   carvedilol (COREG) 25 MG tablet   levothyroxine (SYNTHROID) 25 MCG tablet   Other Relevant Orders   Ambulatory referral to Cardiology   Tachycardia   Relevant Orders   Ambulatory referral to Cardiology   Hyperlipidemia   Relevant Medications   carvedilol (COREG) 25 MG tablet   atorvastatin (LIPITOR) 10 MG tablet   Other Relevant Orders   Ambulatory referral to Cardiology   Hyperglycemia    hgba1c acceptable, minimize simple carbs. Increase exercise as tolerated.       Grief reaction    She is tearful and very sad after her mother died recently she is counseled for 30 minutes. She is following with behavioral health and psychiatry. She is advised that if she is in crisis she should present to [facility].          I have discontinued Modupe K. Tibbitts's famotidine and nebivolol. I have also changed her atorvastatin and levothyroxine. Additionally, I am having her maintain her ondansetron, lisinopril, lisinopril, cyclobenzaprine, omeprazole, gabapentin, busPIRone, ARIPiprazole, LORazepam, venlafaxine XR, and carvedilol.  Meds ordered this encounter  Medications  . atorvastatin (LIPITOR) 10 MG tablet    Sig: Take 0.5 tablets (5 mg total) by mouth daily.    Dispense:  45 tablet    Refill:  1  . levothyroxine (SYNTHROID) 25 MCG tablet    Sig: TAKE 1 TABLET(25 MCG) BY MOUTH DAILY BEFORE BREAKFAST    Dispense:  90 tablet    Refill:  1   I discussed the assessment and treatment plan with the patient. The patient was provided an opportunity to ask questions and all were  answered. The patient agreed with the plan and demonstrated an understanding of the instructions.   The patient was advised to call back or seek an in-person evaluation if the symptoms worsen or if the condition fails to improve as anticipated.  I provided 30 minutes of non-face-to-face time during this encounter.  Penni Homans, MD

## 2019-05-03 ENCOUNTER — Ambulatory Visit: Payer: 59 | Admitting: Family Medicine

## 2019-05-03 ENCOUNTER — Encounter: Payer: Self-pay | Admitting: Adult Health

## 2019-05-03 ENCOUNTER — Ambulatory Visit (INDEPENDENT_AMBULATORY_CARE_PROVIDER_SITE_OTHER): Payer: 59 | Admitting: Adult Health

## 2019-05-03 DIAGNOSIS — F410 Panic disorder [episodic paroxysmal anxiety] without agoraphobia: Secondary | ICD-10-CM

## 2019-05-03 DIAGNOSIS — F411 Generalized anxiety disorder: Secondary | ICD-10-CM

## 2019-05-03 DIAGNOSIS — F4321 Adjustment disorder with depressed mood: Secondary | ICD-10-CM | POA: Insufficient documentation

## 2019-05-03 DIAGNOSIS — F331 Major depressive disorder, recurrent, moderate: Secondary | ICD-10-CM | POA: Diagnosis not present

## 2019-05-03 MED ORDER — ARIPIPRAZOLE 5 MG PO TABS: 5.0000 mg | ORAL_TABLET | Freq: Every day | ORAL | 2 refills | Status: DC

## 2019-05-03 MED ORDER — LORAZEPAM 0.5 MG PO TABS: 0.5000 mg | ORAL_TABLET | Freq: Two times a day (BID) | ORAL | 2 refills | Status: DC

## 2019-05-03 NOTE — Assessment & Plan Note (Signed)
hgba1c acceptable, minimize simple carbs. Increase exercise as tolerated.  

## 2019-05-03 NOTE — Assessment & Plan Note (Signed)
She is tearful and very sad after her mother died recently she is counseled for 30 minutes. She is following with behavioral health and psychiatry. She is advised that if she is in crisis she should present to [facility].

## 2019-05-03 NOTE — Assessment & Plan Note (Signed)
On Levothyroxine, continue to monitor 

## 2019-05-03 NOTE — Assessment & Plan Note (Signed)
Psychiatry has added Abilify and she is tolerating it.

## 2019-05-03 NOTE — Assessment & Plan Note (Signed)
Monitor and report any concerns, no changes to meds. Encouraged heart healthy diet such as the DASH diet and exercise as tolerated.  ?

## 2019-05-03 NOTE — Progress Notes (Signed)
KITZIA Hall [id number] [birth year] 49 y.o.  Virtual Visit via Telephone Note  I connected with pt on 05/03/19 at  1:40 PM EDT by telephone and verified that I am speaking with the correct person using two identifiers.   I discussed the limitations, risks, security and privacy concerns of performing an evaluation and management service by telephone and the availability of in person appointments. I also discussed with the patient that there may be a patient responsible charge related to this service. The patient expressed understanding and agreed to proceed.   I discussed the assessment and treatment plan with the patient. The patient was provided an opportunity to ask questions and all were answered. The patient agreed with the plan and demonstrated an understanding of the instructions.   The patient was advised to call back or seek an in-person evaluation if the symptoms worsen or if the condition fails to improve as anticipated.  I provided 30 minutes of non-face-to-face time during this encounter.  The patient was located at home.  The provider was located at [place].   Aloha Gell, NP   Subjective:   Patient ID:  Lauren Hall is a 49 y.o. (DOB [birth year]) female.  Chief Complaint: No chief complaint on file.   HPI Lauren Hall presents for follow-up of MDD, GAD, and panic attacks.  HPI:   Describes mood today as "not good". Pleasant. Tearful throughout interview. Mood symptoms - reports depression, anxiety, and irritability. Felt like medications were "helping", but mother recently passed away from complications of a heart attack. Mother had a heart surgery - bypass went well. Was taken back to room and went into cardiac arrest and passed away. Stating "I've been so upset". Stepfather staying with her currently.  Husband on dialysis - "he's doing his best to be here for me". Daughter - 72 with severe mental health issues. Dealing with fibromyalgia  pain. Decreased interest and motivation. Taking medications as prescribed.  Energy levels decreased. Active, does not have a regular exercise routine. 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 Enjoys some usual interests and activities. Married. Lives with husband of 20 years. Has one daughter - age 59.  Appetite adequate. Weight gain.  Sleeps well most nights. Averages 6 to 7 hours. Focus and concentration stable. Completing tasks. Managing aspects of household. Unemployed - staying home with daughter.  Denies SI or HI. Denies AH or VH.  Previous medication trials: Pristiq, Wellbutrin, Prozac, Cymbalta, Lexapro, Zoloft,   Review of Systems:  Review of Systems  Musculoskeletal: Negative for gait problem.  Neurological: Negative for tremors.  Psychiatric/Behavioral:       Please refer to HPI    Medications: I have reviewed the patient's current medications.  Current Outpatient Medications  Medication Sig Dispense Refill  . ARIPiprazole (ABILIFY) 5 MG tablet Take 1 tablet (5 mg total) by mouth daily. 30 tablet 2  . atorvastatin (LIPITOR) 10 MG tablet Take 0.5 tablets (5 mg total) by mouth daily. 45 tablet 1  . busPIRone (BUSPAR) 5 MG tablet Take 1 tablet (5 mg total) by mouth 3 (three) times daily. 90 tablet 0  . carvedilol (COREG) 25 MG tablet Take 25 mg by mouth 2 (two) times daily with a meal.    . cyclobenzaprine (FLEXERIL) 10 MG tablet Take 0.5-1 tablets (5-10 mg total) by mouth 3 (three) times daily as needed for muscle spasms. 90 tablet 2  . gabapentin (NEURONTIN) 300 MG capsule Take 2 capsules (600 mg total) by mouth 3 (three) times  daily. 180 capsule 1  . levothyroxine (SYNTHROID) 25 MCG tablet TAKE 1 TABLET(25 MCG) BY MOUTH DAILY BEFORE BREAKFAST 90 tablet 1  . lisinopril (PRINIVIL,ZESTRIL) 10 MG tablet TAKE 1 TABLET(10 MG) BY MOUTH TWICE DAILY 180 tablet 0  . lisinopril (PRINIVIL,ZESTRIL) 20 MG tablet Take 1 tablet (20 mg total) by mouth daily. 90 tablet 3  . LORazepam (ATIVAN) 0.5 MG  tablet Take 1 tablet (0.5 mg total) by mouth 2 (two) times daily. 30 tablet 2  . omeprazole (PRILOSEC) 40 MG capsule Take 1 capsule (40 mg total) by mouth daily. 90 capsule 1  . ondansetron (ZOFRAN) 4 MG tablet Take 1 tablet (4 mg total) by mouth every 8 (eight) hours as needed for nausea or vomiting. (Patient not taking: Reported on 03/27/2019) 20 tablet 1  . venlafaxine XR (EFFEXOR-XR) 75 MG 24 hr capsule TAKE 1 CAPSULE BY MOUTH EVERYDAY AT BEDTIME 90 capsule 0   No current facility-administered medications for this visit.    Medication Side Effects: None  Allergies:  Allergies  Allergen Reactions  . Promethazine Anaphylaxis    Past Medical History:  Diagnosis Date  . Allergy   . Anemia   . Anxiety   . Bronchitis 07/27/2011  . 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 Complication of anesthesia    patient woke up while they were sewing up her incision with breast lumpectomy  . Depression   . Dysrhythmia    Tachycardia  . Enlarged thyroid   . Fatigue 10/08/2010  . Fibromyalgia 2009  . GERD (gastroesophageal reflux disease) 09/26/2013  . Headache(784.0) 10/08/2010   Migraines  . Hip pain, bilateral 11/06/2010  . Hyperglycemia 08/11/2015   normal Hgb A 1C per patient  . Hypertension   . Low back pain radiating to right leg 03/11/2015  . Other and unspecified hyperlipidemia 07/29/2012  . Peripheral neuropathy 11/11/2012   burning sensation mainly right leg  . Poor concentration 01/11/2011  . Preventative health care 07/29/2012  . Sinusitis, acute 07/27/2011  . Tachycardia   . Thyroid disease 10/08/2010  . Tick bite of flank 11/09/2011  . Tinea corporis 11/11/2012  . Uterine leiomyoma     Family History  Problem Relation Age of Onset  . Fibromyalgia Mother   . Diabetes Mother        type 2  . Hypertension Mother   . Hyperlipidemia Mother   . Arthritis Mother   . Alcohol abuse Father   . Colon cancer Father   . Hypertension Brother   . Alcohol abuse Brother   . Heart disease  Maternal Grandmother   . Hyperlipidemia Maternal Grandmother   . Diabetes Maternal Grandmother        type 2  . Colon cancer Maternal Grandmother   . Stroke Maternal Grandfather   . Diabetes Maternal Grandfather        type 2  . Ovarian cancer Paternal Grandmother   . Alzheimer's disease Paternal Grandfather     Social History   Socioeconomic History  . Marital status: Married    Spouse name: Montine Circle  . Number of children: Not on file  . Years of education: Not on file  . Highest education level: Not on file  Occupational History  . Not on file  Tobacco Use  . Smoking status: Never Smoker  . Smokeless tobacco: Never Used  Substance and Sexual Activity  . Alcohol use: Yes    Comment: special occassion- very rarely  . Drug use: No  . Sexual activity: Yes  Partners: Male    Birth control/protection: None  Other Topics Concern  . Not on file  Social History Narrative  . Not on file   Social Determinants of Health   Financial Resource Strain:   . Difficulty of Paying Living Expenses:   Food Insecurity:   . Worried About Charity fundraiser in the Last Year:   . Arboriculturist in the Last Year:   Transportation Needs:   . Film/video editor (Medical):   Marland Kitchen Lack of Transportation (Non-Medical):   Physical Activity:   . Days of Exercise per Week:   . Minutes of Exercise per Session:   Stress:   . Feeling of Stress :   Social Connections:   . Frequency of Communication with Friends and Family:   . Frequency of Social Gatherings with Friends and Family:   . Attends Religious Services:   . Active Member of Clubs or Organizations:   . Attends Archivist Meetings:   Marland Kitchen Marital Status:   Intimate Partner Violence:   . Fear of Current or Ex-Partner:   . Emotionally Abused:   Marland Kitchen Physically Abused:   . Sexually Abused:     Past Medical History, Surgical history, Social history, and Family history were reviewed and updated as appropriate.   Please see  review of systems for further details on the patient's review from today.   Objective:   Physical Exam:  LMP 09/02/2015 (Approximate) Comment: continous bleeding  Physical Exam Neurological:     Mental Status: She is alert and oriented to person, place, and time.     Cranial Nerves: No dysarthria.  Psychiatric:        Attention and Perception: Attention and perception normal.        Mood and Affect: Mood is anxious and depressed.        Speech: Speech normal.        Behavior: Behavior is cooperative.        Thought Content: Thought content normal. Thought content is not paranoid or delusional. Thought content does not include homicidal or suicidal ideation. Thought content does not include homicidal or suicidal plan.        Cognition and Memory: Cognition and memory normal.        Judgment: Judgment normal.     Comments: Insight intact     Lab Review:     Component Value Date/Time   NA 142 01/17/2018 0128   K 3.6 01/17/2018 0128   CL 104 01/17/2018 0128   CO2 26 01/17/2018 0128   GLUCOSE 127 (H) 01/17/2018 0128   BUN <5 (L) 01/17/2018 0128   CREATININE 0.83 01/17/2018 0128   CREATININE 0.79 09/24/2013 1544   CALCIUM 9.6 01/17/2018 0128   PROT 7.8 01/17/2018 0128   ALBUMIN 4.5 01/17/2018 0128   AST 22 01/17/2018 0128   ALT 24 01/17/2018 0128   ALKPHOS 100 01/17/2018 0128   BILITOT 0.5 01/17/2018 0128   GFRNONAA >60 01/17/2018 0128   GFRAA >60 01/17/2018 0128       Component Value Date/Time   WBC 7.4 01/17/2018 0128   RBC 4.90 01/17/2018 0128   HGB 14.9 01/17/2018 0128   HCT 43.5 01/17/2018 0128   PLT 337 01/17/2018 0128   MCV 88.8 01/17/2018 0128   MCH 30.4 01/17/2018 0128   MCHC 34.3 01/17/2018 0128   RDW 12.6 01/17/2018 0128   LYMPHSABS 2.7 03/07/2012 1556   MONOABS 0.6 03/07/2012 1556   EOSABS 0.1 03/07/2012 1556  BASOSABS 0.0 03/07/2012 1556    No results found for: POCLITH, LITHIUM   No results found for: PHENYTOIN, PHENOBARB, VALPROATE, CBMZ    .res Assessment: Plan:    Plan:  PDMP reviewed  1. Buspar 5mg  TID   2. Effexor XR 75mg  daily   3. Abilify 2 mg to 5 mg daily - will start at 2.5mg  daily and increase as needed 4. Ativan 0.5mg  twice daily  Therapist - Marya Fossa  RTC 4 weeks  Patient advised to contact office with any questions, adverse effects, or acute worsening in signs and symptoms.  Discussed potential benefits, risk, and side effects of benzodiazepines to include potential risk of tolerance and dependence, as well as possible drowsiness.  Advised patient not to drive if experiencing drowsiness and to take lowest possible effective dose to minimize risk of dependence and tolerance.  Discussed potential metabolic side effects associated with atypical antipsychotics, as well as potential risk for movement side effects. Advised pt to contact office if movement side effects occur.    Diagnoses and all orders for this visit:  Generalized anxiety disorder -     LORazepam (ATIVAN) 0.5 MG tablet; Take 1 tablet (0.5 mg total) by mouth 2 (two) times daily. -     ARIPiprazole (ABILIFY) 5 MG tablet; Take 1 tablet (5 mg total) by mouth daily.  Panic attacks -     LORazepam (ATIVAN) 0.5 MG tablet; Take 1 tablet (0.5 mg total) by mouth 2 (two) times daily.  Major depressive disorder, recurrent episode, moderate (HCC) -     ARIPiprazole (ABILIFY) 5 MG tablet; Take 1 tablet (5 mg total) by mouth daily.    Please see After Visit Summary for patient specific instructions.  Future Appointments  Date Time Provider [place]  05/07/2019 11:00 AM Gerri Lins [facility] None  05/08/2019  3:15 PM [facility]  05/09/2019  2:15 PM [facility] 1 [facility]  05/14/2019  2:00 PM Gerri Lins [facility] None  05/21/2019  2:00 PM Gerri Lins [facility] None  05/24/2019  2:35 PM Tobb, Kardie, DO [facility] None  06/01/2019 10:40 AM Mosie Lukes, MD [facility]    No orders of the defined  types were placed in this encounter.     -------------------------------

## 2019-05-07 ENCOUNTER — Ambulatory Visit (INDEPENDENT_AMBULATORY_CARE_PROVIDER_SITE_OTHER): Payer: 59 | Admitting: Psychology

## 2019-05-07 DIAGNOSIS — F411 Generalized anxiety disorder: Secondary | ICD-10-CM | POA: Diagnosis not present

## 2019-05-08 ENCOUNTER — Other Ambulatory Visit (INDEPENDENT_AMBULATORY_CARE_PROVIDER_SITE_OTHER): Payer: 59

## 2019-05-08 ENCOUNTER — Other Ambulatory Visit: Payer: Self-pay

## 2019-05-08 DIAGNOSIS — R739 Hyperglycemia, unspecified: Secondary | ICD-10-CM | POA: Diagnosis not present

## 2019-05-08 DIAGNOSIS — E782 Mixed hyperlipidemia: Secondary | ICD-10-CM

## 2019-05-08 DIAGNOSIS — I10 Essential (primary) hypertension: Secondary | ICD-10-CM

## 2019-05-09 ENCOUNTER — Ambulatory Visit (HOSPITAL_BASED_OUTPATIENT_CLINIC_OR_DEPARTMENT_OTHER)
Admission: RE | Admit: 2019-05-09 | Discharge: 2019-05-09 | Disposition: A | Discharge status: Home / Self Care | Payer: 59 | Source: Ambulatory Visit | Attending: Family Medicine | Admitting: Family Medicine

## 2019-05-09 DIAGNOSIS — R0600 Dyspnea, unspecified: Secondary | ICD-10-CM | POA: Insufficient documentation

## 2019-05-09 LAB — COMPREHENSIVE METABOLIC PANEL WITH GFR
ALT: 27 U/L (ref 0–35)
AST: 23 U/L (ref 0–37)
Albumin: 4.4 g/dL (ref 3.5–5.2)
Alkaline Phosphatase: 122 U/L — ABNORMAL HIGH (ref 39–117)
BUN: 5 mg/dL — ABNORMAL LOW (ref 6–23)
CO2: 30 meq/L (ref 19–32)
Calcium: 9.4 mg/dL (ref 8.4–10.5)
Chloride: 101 meq/L (ref 96–112)
Creatinine, Ser: 0.78 mg/dL (ref 0.40–1.20)
GFR: 94.42 mL/min
Glucose, Bld: 85 mg/dL (ref 70–99)
Potassium: 4.4 meq/L (ref 3.5–5.1)
Sodium: 139 meq/L (ref 135–145)
Total Bilirubin: 0.3 mg/dL (ref 0.2–1.2)
Total Protein: 7.1 g/dL (ref 6.0–8.3)

## 2019-05-09 LAB — CBC
HCT: 39 % (ref 36.0–46.0)
Hemoglobin: 13.1 g/dL (ref 12.0–15.0)
MCHC: 33.7 g/dL (ref 30.0–36.0)
MCV: 92.5 fl (ref 78.0–100.0)
Platelets: 351 10*3/uL (ref 150.0–400.0)
RBC: 4.21 Mil/uL (ref 3.87–5.11)
RDW: 13.8 % (ref 11.5–15.5)
WBC: 4.9 10*3/uL (ref 4.0–10.5)

## 2019-05-09 LAB — LIPID PANEL
Cholesterol: 206 mg/dL — ABNORMAL HIGH (ref 0–200)
HDL: 67.6 mg/dL (ref >39.00)
LDL Cholesterol: 103 mg/dL — ABNORMAL HIGH (ref 0–99)
NonHDL: 138.49
Total CHOL/HDL Ratio: 3
Triglycerides: 177 mg/dL — ABNORMAL HIGH (ref 0.0–149.0)
VLDL: 35.4 mg/dL (ref 0.0–40.0)

## 2019-05-09 LAB — TSH: TSH: 0.46 u[IU]/mL (ref 0.35–4.50)

## 2019-05-09 LAB — HEMOGLOBIN A1C: Hgb A1c MFr Bld: 5.7 % (ref 4.6–6.5)

## 2019-05-09 NOTE — Progress Notes (Signed)
  Echocardiogram 2D Echocardiogram has been performed.  Lauren Hall 05/09/2019, 3:18 PM

## 2019-05-14 ENCOUNTER — Ambulatory Visit (INDEPENDENT_AMBULATORY_CARE_PROVIDER_SITE_OTHER): Payer: 59 | Admitting: Psychology

## 2019-05-14 DIAGNOSIS — F411 Generalized anxiety disorder: Secondary | ICD-10-CM

## 2019-05-17 ENCOUNTER — Other Ambulatory Visit: Payer: Self-pay | Admitting: *Deleted

## 2019-05-17 MED ORDER — LISINOPRIL 10 MG PO TABS: ORAL_TABLET | ORAL | 1 refills | Status: DC

## 2019-05-21 ENCOUNTER — Ambulatory Visit: Payer: 59 | Admitting: Psychology

## 2019-05-24 ENCOUNTER — Encounter: Payer: Self-pay | Admitting: Cardiology

## 2019-05-24 ENCOUNTER — Other Ambulatory Visit: Payer: Self-pay

## 2019-05-24 ENCOUNTER — Encounter: Payer: Self-pay | Admitting: *Deleted

## 2019-05-24 ENCOUNTER — Ambulatory Visit (INDEPENDENT_AMBULATORY_CARE_PROVIDER_SITE_OTHER): Payer: 59 | Admitting: Cardiology

## 2019-05-24 ENCOUNTER — Ambulatory Visit (INDEPENDENT_AMBULATORY_CARE_PROVIDER_SITE_OTHER): Payer: 59

## 2019-05-24 VITALS — BP 160/100 | HR 107 | Ht 59.5 in | Wt 157.0 lb

## 2019-05-24 DIAGNOSIS — R001 Bradycardia, unspecified: Secondary | ICD-10-CM

## 2019-05-24 DIAGNOSIS — I10 Essential (primary) hypertension: Secondary | ICD-10-CM

## 2019-05-24 DIAGNOSIS — R000 Tachycardia, unspecified: Secondary | ICD-10-CM | POA: Diagnosis not present

## 2019-05-24 DIAGNOSIS — R9431 Abnormal electrocardiogram [ECG] [EKG]: Secondary | ICD-10-CM | POA: Diagnosis not present

## 2019-05-24 DIAGNOSIS — E782 Mixed hyperlipidemia: Secondary | ICD-10-CM

## 2019-05-24 DIAGNOSIS — R0602 Shortness of breath: Secondary | ICD-10-CM

## 2019-05-24 MED ORDER — DILTIAZEM HCL ER COATED BEADS 180 MG PO CP24: 180.0000 mg | ORAL_CAPSULE | Freq: Every day | ORAL | 1 refills | Status: DC

## 2019-05-24 NOTE — Progress Notes (Signed)
Cardiology Office Note:    Date:  05/24/2019   ID:  Lauren Hall, DOB 1970/07/05, MRN [id number]  PCP:  Mosie Lukes, MD  Cardiologist:  Berniece Salines, DO  Electrophysiologist:  None   Referring MD: Mosie Lukes, MD   Chief Complaint  Patient presents with  . New Patient (Initial Visit)   The patient is here today for a follow up visit.    History of Present Illness:    Lauren Hall is a 49 y.o. female with a hx of hyperlipidemia and a family history of coronary artery disease, with her mother recently dying from an MI with total occlusion on her autopsy.  She tells me now for several months she has been experiencing shortness of breath on exertion.  In addition she has been also experiencing significant baseline tachycardia.  She said even if she sitting down heart rate goes so fast that she cannot do anything because she is so fatigued.  She notes that in the past she was on Bystolic which helped but due to insurance purposes this was not covered and she was switched to carvedilol.  However what is most bothersome is the fact that she is experiencing these shortness of breath.  Past Medical History:  Diagnosis Date  . Allergy   . Anemia   . Anxiety   . Bronchitis 07/27/2011  . 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 Complication of anesthesia    patient woke up while they were sewing up her incision with breast lumpectomy  . Depression   . Dysrhythmia    Tachycardia  . Enlarged thyroid   . Fatigue 10/08/2010  . Fibromyalgia 2009  . GERD (gastroesophageal reflux disease) 09/26/2013  . Headache(784.0) 10/08/2010   Migraines  . Hip pain, bilateral 11/06/2010  . Hyperglycemia 08/11/2015   normal Hgb A 1C per patient  . Hypertension   . Low back pain radiating to right leg 03/11/2015  . Other and unspecified hyperlipidemia 07/29/2012  . Peripheral neuropathy 11/11/2012   burning sensation mainly right leg  . Poor concentration 01/11/2011  . Preventative health care 07/29/2012   . Sinusitis, acute 07/27/2011  . Tachycardia   . Thyroid disease 10/08/2010  . Tick bite of flank 11/09/2011  . Tinea corporis 11/11/2012  . Uterine leiomyoma     Past Surgical History:  Procedure Laterality Date  . BREAST LUMPECTOMY Right 1999   benign   . CYSTOSCOPY N/A 10/07/2015   Procedure: CYSTOSCOPY;  Surgeon: Sanjuana Kava, MD;  Location: [facility];  Service: Gynecology;  Laterality: N/A;    Current Medications: Current Meds  Medication Sig  . ARIPiprazole (ABILIFY) 5 MG tablet Take 1 tablet (5 mg total) by mouth daily.  Marland Kitchen atorvastatin (LIPITOR) 10 MG tablet Take 0.5 tablets (5 mg total) by mouth daily.  . busPIRone (BUSPAR) 5 MG tablet Take 1 tablet (5 mg total) by mouth 3 (three) times daily.  . cyclobenzaprine (FLEXERIL) 10 MG tablet Take 0.5-1 tablets (5-10 mg total) by mouth 3 (three) times daily as needed for muscle spasms.  Marland Kitchen gabapentin (NEURONTIN) 300 MG capsule Take 2 capsules (600 mg total) by mouth 3 (three) times daily.  Marland Kitchen levothyroxine (SYNTHROID) 25 MCG tablet TAKE 1 TABLET(25 MCG) BY MOUTH DAILY BEFORE BREAKFAST  . lisinopril (ZESTRIL) 10 MG tablet TAKE 1 TABLET(10 MG) BY MOUTH TWICE DAILY  . LORazepam (ATIVAN) 0.5 MG tablet Take 1 tablet (0.5 mg total) by mouth 2 (two) times daily.  Marland Kitchen omeprazole (PRILOSEC) 40 MG capsule Take 1  capsule (40 mg total) by mouth daily.  . ondansetron (ZOFRAN) 4 MG tablet Take 1 tablet (4 mg total) by mouth every 8 (eight) hours as needed for nausea or vomiting.  . venlafaxine XR (EFFEXOR-XR) 75 MG 24 hr capsule TAKE 1 CAPSULE BY MOUTH EVERYDAY AT BEDTIME  . [DISCONTINUED] carvedilol (COREG) 25 MG tablet Take 25 mg by mouth 2 (two) times daily with a meal.     Allergies:   Promethazine   Social History   Socioeconomic History  . Marital status: Married    Spouse name: Montine Circle  . Number of children: Not on file  . Years of education: Not on file  . Highest education level: Not on file  Occupational History  . Not on file  Tobacco  Use  . Smoking status: Never Smoker  . Smokeless tobacco: Never Used  Substance and Sexual Activity  . Alcohol use: Yes    Comment: special occassion- very rarely  . Drug use: No  . Sexual activity: Yes    Partners: Male    Birth control/protection: None  Other Topics Concern  . Not on file  Social History Narrative  . Not on file   Social Determinants of Health   Financial Resource Strain:   . Difficulty of Paying Living Expenses:   Food Insecurity:   . Worried About Charity fundraiser in the Last Year:   . Arboriculturist in the Last Year:   Transportation Needs:   . Film/video editor (Medical):   Marland Kitchen Lack of Transportation (Non-Medical):   Physical Activity:   . Days of Exercise per Week:   . Minutes of Exercise per Session:   Stress:   . Feeling of Stress :   Social Connections:   . Frequency of Communication with Friends and Family:   . Frequency of Social Gatherings with Friends and Family:   . Attends Religious Services:   . Active Member of Clubs or Organizations:   . Attends Archivist Meetings:   Marland Kitchen Marital Status:      Family History: The patient's family history includes Alcohol abuse in her brother and father; Alzheimer's disease in her paternal grandfather; Arthritis in her mother; Colon cancer in her father and maternal grandmother; Diabetes in her maternal grandfather, maternal grandmother, and mother; Fibromyalgia in her mother; Heart disease in her maternal grandmother; Hyperlipidemia in her maternal grandmother and mother; Hypertension in her brother and mother; Ovarian cancer in her paternal grandmother; Stroke in her maternal grandfather.  ROS:   Review of Systems  Constitution: Negative for decreased appetite, fever and weight gain.  HENT: Negative for congestion, ear discharge, hoarse voice and sore throat.   Eyes: Negative for discharge, redness, vision loss in right eye and visual halos.  Cardiovascular: Report dyspnea on exertion  and palpitations.  Negative for chest pain, leg swelling, orthopnea.  Respiratory: Reports shortness of breath.  Negative for cough, hemoptysis, and snoring.   Endocrine: Negative for heat intolerance and polyphagia.  Hematologic/Lymphatic: Negative for bleeding problem. Does not bruise/bleed easily.  Skin: Negative for flushing, nail changes, rash and suspicious lesions.  Musculoskeletal: Negative for arthritis, joint pain, muscle cramps, myalgias, neck pain and stiffness.  Gastrointestinal: Negative for abdominal pain, bowel incontinence, diarrhea and excessive appetite.  Genitourinary: Negative for decreased libido, genital sores and incomplete emptying.  Neurological: Negative for brief paralysis, focal weakness, headaches and loss of balance.  Psychiatric/Behavioral: Negative for altered mental status, depression and suicidal ideas.  Allergic/Immunologic: Negative for HIV  exposure and persistent infections.    EKGs/Labs/Other Studies Reviewed:    The following studies were reviewed today:   EKG:  The ekg ordered today demonstrates sinus tachycardia, heart rate 107 bpm poor R wave progression and T wave inversions in the high lateral leads which could be suggestive of lateral wall ischemia.   TTE IMPRESSIONS  1. Left ventricular ejection fraction, by estimation, is 55 to 60%. The  left ventricle has normal function. The left ventricle has no regional wall motion abnormalities. There is mild concentric left ventricular hypertrophy. Left ventricular diastolic parameters are consistent with Grade I diastolic dysfunction (impaired relaxation).  2. Right ventricular systolic function is normal. The right ventricular size is normal.  3. The mitral valve is normal in structure. No evidence of mitral valve regurgitation. No evidence of mitral stenosis.  4. The aortic valve is tricuspid. Aortic valve regurgitation is not visualized. No aortic stenosis is present.  5. The inferior vena cava  is normal in size with greater than 50%  respiratory variability, suggesting right atrial pressure of 3 mmHg.   Recent Labs: 05/08/2019: ALT 27; BUN 5; Creatinine, Ser 0.78; Hemoglobin 13.1; Platelets 351.0; Potassium 4.4; Sodium 139; TSH 0.46  Recent Lipid Panel    Component Value Date/Time   CHOL 206 (H) 05/08/2019 1517   TRIG 177.0 (H) 05/08/2019 1517   HDL 67.60 05/08/2019 1517   CHOLHDL 3 05/08/2019 1517   VLDL 35.4 05/08/2019 1517   LDLCALC 103 (H) 05/08/2019 1517    Physical Exam:    VS:  BP (!) 160/100   Pulse (!) 107   Ht 4' 11.5" (1.511 m)   Wt 157 lb (71.2 kg)   LMP 09/02/2015 (Approximate) Comment: continous bleeding  SpO2 97%   BMI 31.18 kg/m     Wt Readings from Last 3 Encounters:  05/24/19 157 lb (71.2 kg)  05/01/19 154 lb 3.2 oz (69.9 kg)  11/01/17 154 lb 6.4 oz (70 kg)     GEN: Well nourished, well developed in no acute distress HEENT: Normal NECK: No JVD; No carotid bruits LYMPHATICS: No lymphadenopathy CARDIAC: S1S2 noted,RRR, no murmurs, rubs, gallops RESPIRATORY:  Clear to auscultation without rales, wheezing or rhonchi  ABDOMEN: Soft, non-tender, non-distended, +bowel sounds, no guarding. EXTREMITIES: No edema, No cyanosis, no clubbing MUSCULOSKELETAL:  No deformity  SKIN: Warm and dry NEUROLOGIC:  Alert and oriented x 3, non-focal PSYCHIATRIC:  Normal affect, good insight  ASSESSMENT:    1. Shortness of breath   2. Abnormal EKG   3. Palpiations   4. Hypertension, unspecified type   5. Mixed hyperlipidemia   6. Tachycardia    PLAN:     1.  Shortness of breath on exertion-I am concerned about this given her abnormal EKG as well as her family history.  I do believe the patient proceed with further testing.  An exercise nuclear stress test will be appropriate in this patient at this time.  She does need imaging given her intermediate risk factor.  2.  In terms of her palpitations-she will wear her ZIO monitor for 7 days to rule out any  arrhythmias.  I am suspecting that inappropriate tachycardia may be an issue here but we will review to monitor and make further decisions.  It appears that her carvedilol is not helping we will stop this medicine and started patient on Cardizem 180 mg and titrate up to keep heart rate less than 100 as long as blood pressure can tolerate.  3.  Is hypertensive in  the office today.  So hopefully with the Cardizem we can be able to help control her blood pressure.  She is on lisinopril 10 mg twice a day I have asked the patient to take her blood pressure at home and report to my office with these results and will be able to make adjustments in the meantime until her next follow-up visit which will be in 1 month.  4.  I do understand the patient is undergoing grief as she has just recently lost her mother -she may need grief counseling however I will defer to associate her primary care doctor to discuss.  5.  Hyperlipidemia continue patient on Lipitor 10 mg daily.  6.  Obesity-the patient understands the need to lose weight with diet and exercise. We have discussed specific strategies for this.  the patient is in agreement with the above plan. The patient left the office in stable condition.  The patient will follow up in 1 month or sooner if needed.   Medication Adjustments/Labs and Tests Ordered: Current medicines are reviewed at length with the patient today.  Concerns regarding medicines are outlined above.  Orders Placed This Encounter  Procedures  . LONG TERM MONITOR (3-14 DAYS)  . MYOCARDIAL PERFUSION IMAGING  . EKG 12-Lead   Meds ordered this encounter  Medications  . diltiazem (CARDIZEM CD) 180 MG 24 hr capsule    Sig: Take 1 capsule (180 mg total) by mouth daily.    Dispense:  30 capsule    Refill:  1    Patient Instructions  Medication Instructions:  Your physician has recommended you make the following change in your medication:  1.  STOP Carvedilol 2.  START Cardizem 180 mg  taking 1 daily    *If you need a refill on your cardiac medications before your next appointment, please call your pharmacy*   Lab Work: None ordered  If you have labs (blood work) drawn today and your tests are completely normal, you will receive your results only by: Marland Kitchen MyChart Message (if you have MyChart) OR . A paper copy in the mail If you have any lab test that is abnormal or we need to change your treatment, we will call you to review the results.   Testing/Procedures: Bryn Gulling- Long Term Monitor Instructions   Your physician has requested you wear your ZIO patch monitor 7 days.   This is a single patch monitor.  [employer] supplies one patch monitor per enrollment.  Additional stickers are not available.    Once you have received you monitor, please review enclosed instructions.  Your monitor has already been registered assigning a specific monitor serial # to you.   Applying the monitor   Shave hair from upper left chest.   Hold abrader disc by orange tab.  Rub abrader in 40 strokes over left upper chest as indicated in your monitor instructions.   Clean area with 4 enclosed alcohol pads .  Use all pads to assure are is cleaned thoroughly.  Let dry.   Apply patch as indicated in monitor instructions.  Patch will be place under collarbone on left side of chest with arrow pointing upward.   Rub patch adhesive wings for 2 minutes.Remove white label marked "1".  Remove white label marked "2".  Rub patch adhesive wings for 2 additional minutes.   While looking in a mirror, press and release button in center of patch.  A small green light will flash 3-4 times .  This will be your  only indicator the monitor has been turned on.     Do not shower for the first 24 hours.  You may shower after the first 24 hours.   Press button if you feel a symptom. You will hear a small click.  Record Date, Time and Symptom in the Patient Log Book.   When you are ready to remove patch, follow  instructions on last 2 pages of Patient Log Book.  Stick patch monitor onto last page of Patient Log Book.   Place Patient Log Book in [place] box.  Use locking tab on box and tape box closed securely.  The [employer] and [employer] has [employer] on it.  Please place in mailbox as soon as possible.  Your physician should have your test results approximately 7 days after the monitor has been mailed back to [facility].   Call [place] at [phone] if you have questions regarding your ZIO XT patch monitor.  Call them immediately if you see an orange light blinking on your monitor.   If your monitor falls off in less than 4 days contact our Monitor department at [phone].  If your monitor becomes loose or falls off after 4 days call [employer] at [phone] for suggestions on securing your monitor.   Your physician has requested that you have en exercise stress myoview. For further information please visit [email or web address]. Please follow instruction sheet, as given.     Follow-Up: At [facility], you and your health needs are our priority.  As part of our continuing mission to provide you with exceptional heart care, we have created designated Provider Care Teams.  These Care Teams include your primary Cardiologist (physician) and Advanced Practice Providers (APPs -  Physician Assistants and Nurse Practitioners) who all work together to provide you with the care you need, when you need it.  We recommend signing up for the patient portal called "MyChart".  Sign up information is provided on this After Visit Summary.  MyChart is used to connect with patients for Virtual Visits (Telemedicine).  Patients are able to view lab/test results, encounter notes, upcoming appointments, etc.  Non-urgent messages can be sent to your provider as well.   To learn more about what you can do with MyChart, go to [email or web address].    Your next appointment:   1 month(s)  The  format for your next appointment:   In Person  Provider:   Berniece Salines, DO   Other Instructions  Please monitor your blood pressure daily, at least 2-3 hours after you take your blood pressure medication.  Keep a log and send me a mychart message in 1 week with these readings.     Adopting a Healthy Lifestyle.  Know what a healthy weight is for you (roughly BMI <25) and aim to maintain this   Aim for 7+ servings of fruits and vegetables daily   65-80+ fluid ounces of water or unsweet tea for healthy kidneys   Limit to max 1 drink of alcohol per day; avoid smoking/tobacco   Limit animal fats in diet for cholesterol and heart health - choose grass fed whenever available   Avoid highly processed foods, and foods high in saturated/trans fats   Aim for low stress - take time to unwind and care for your mental health   Aim for 150 min of moderate intensity exercise weekly for heart health, and weights twice weekly for bone health   Aim for 7-9 hours of sleep daily   When  it comes to diets, agreement about the perfect plan isnt easy to find, even among the experts. Experts at the [place] [place] developed an idea known as the Healthy Eating Plate. Just imagine a plate divided into logical, healthy portions.   The emphasis is on diet quality:   Load up on vegetables and fruits - one-half of your plate: Aim for color and variety, and remember that potatoes dont count.   Go for whole grains - one-quarter of your plate: Whole wheat, barley, wheat berries, quinoa, oats, brown rice, and foods made with them. If you want pasta, go with whole wheat pasta.   Protein power - one-quarter of your plate: Fish, chicken, beans, and nuts are all healthy, versatile protein sources. Limit red meat.   The diet, however, does go beyond the plate, offering a few other suggestions.   Use healthy plant oils, such as olive, canola, soy, corn, sunflower and peanut. Check the labels,  and avoid partially hydrogenated oil, which have unhealthy trans fats.   If youre thirsty, drink water. Coffee and tea are good in moderation, but skip sugary drinks and limit milk and dairy products to one or two daily servings.   The type of carbohydrate in the diet is more important than the amount. Some sources of carbohydrates, such as vegetables, fruits, whole grains, and beans-are healthier than others.   Finally, stay active  Signed, Berniece Salines, DO  05/24/2019 3:55 PM    [place]

## 2019-05-24 NOTE — Patient Instructions (Addendum)
Medication Instructions:  Your physician has recommended you make the following change in your medication:  1.  STOP Carvedilol 2.  START Cardizem 180 mg taking 1 daily    *If you need a refill on your cardiac medications before your next appointment, please call your pharmacy*   Lab Work: None ordered  If you have labs (blood work) drawn today and your tests are completely normal, you will receive your results only by: Marland Kitchen MyChart Message (if you have MyChart) OR . A paper copy in the mail If you have any lab test that is abnormal or we need to change your treatment, we will call you to review the results.   Testing/Procedures: Bryn Gulling- Long Term Monitor Instructions   Your physician has requested you wear your ZIO patch monitor 7 days.   This is a single patch monitor.  Ir[employer] supplies one patch monitor per enrollment.  Additional stickers are not available.    Once you have received you monitor, please review enclosed instructions.  Your monitor has already been registered assigning a specific monitor serial # to you.   Applying the monitor   Shave hair from upper left chest.   Hold abrader disc by orange tab.  Rub abrader in 40 strokes over left upper chest as indicated in your monitor instructions.   Clean area with 4 enclosed alcohol pads .  Use all pads to assure are is cleaned thoroughly.  Let dry.   Apply patch as indicated in monitor instructions.  Patch will be place under collarbone on left side of chest with arrow pointing upward.   Rub patch adhesive wings for 2 minutes.Remove white label marked "1".  Remove white label marked "2".  Rub patch adhesive wings for 2 additional minutes.   While looking in a mirror, press and release button in center of patch.  A small green light will flash 3-4 times .  This will be your only indicator the monitor has been turned on.     Do not shower for the first 24 hours.  You may shower after the first 24 hours.   Press button  if you feel a symptom. You will hear a small click.  Record Date, Time and Symptom in the Patient Log Book.   When you are ready to remove patch, follow instructions on last 2 pages of Patient Log Book.  Stick patch monitor onto last page of Patient Log Book.   Place Patient Log Book in [place] box.  Use locking tab on box and tape box closed securely.  The [employer] has [employer] on it.  Please place in mailbox as soon as possible.  Your physician should have your test results approximately 7 days after the monitor has been mailed back to [facility].   Call [place] at [phone] if you have questions regarding your ZIO XT patch monitor.  Call them immediately if you see an orange light blinking on your monitor.   If your monitor falls off in less than 4 days contact our Monitor department at [phone].  If your monitor becomes loose or falls off after 4 days call [employer] at [phone] for suggestions on securing your monitor.   Your physician has requested that you have en exercise stress myoview. For further information please visit [email or web address]. Please follow instruction sheet, as given.     Follow-Up: At [facility], you and your health needs are our priority.  As part of our continuing mission to  provide you with exceptional heart care, we have created designated Provider Care Teams.  These Care Teams include your primary Cardiologist (physician) and Advanced Practice Providers (APPs -  Physician Assistants and Nurse Practitioners) who all work together to provide you with the care you need, when you need it.  We recommend signing up for the patient portal called "MyChart".  Sign up information is provided on this After Visit Summary.  MyChart is used to connect with patients for Virtual Visits (Telemedicine).  Patients are able to view lab/test results, encounter notes, upcoming appointments, etc.  Non-urgent messages can be sent to  your provider as well.   To learn more about what you can do with MyChart, go to [email or web address].    Your next appointment:   1 month(s)  The format for your next appointment:   In Person  Provider:   Berniece Salines, DO   Other Instructions  Please monitor your blood pressure daily, at least 2-3 hours after you take your blood pressure medication.  Keep a log and send me a mychart message in 1 week with these readings.

## 2019-05-29 ENCOUNTER — Ambulatory Visit (INDEPENDENT_AMBULATORY_CARE_PROVIDER_SITE_OTHER): Payer: 59 | Admitting: Psychology

## 2019-05-29 DIAGNOSIS — F411 Generalized anxiety disorder: Secondary | ICD-10-CM | POA: Diagnosis not present

## 2019-05-31 ENCOUNTER — Encounter (HOSPITAL_COMMUNITY): Payer: Self-pay | Admitting: *Deleted

## 2019-05-31 ENCOUNTER — Other Ambulatory Visit (HOSPITAL_COMMUNITY)
Admission: RE | Admit: 2019-05-31 | Discharge: 2019-05-31 | Disposition: A | Discharge status: Home / Self Care | Payer: 59 | Source: Ambulatory Visit | Attending: Cardiology | Admitting: Cardiology

## 2019-05-31 ENCOUNTER — Telehealth (HOSPITAL_COMMUNITY): Payer: Self-pay | Admitting: *Deleted

## 2019-05-31 ENCOUNTER — Telehealth: Payer: Self-pay | Admitting: Cardiology

## 2019-05-31 DIAGNOSIS — Z01812 Encounter for preprocedural laboratory examination: Secondary | ICD-10-CM | POA: Insufficient documentation

## 2019-05-31 DIAGNOSIS — Z20822 Contact with and (suspected) exposure to covid-19: Secondary | ICD-10-CM | POA: Diagnosis not present

## 2019-05-31 LAB — SARS CORONAVIRUS 2 (TAT 6-24 HRS): SARS Coronavirus 2: NEGATIVE

## 2019-05-31 NOTE — Telephone Encounter (Signed)
Spoke with the patient and let her know that the COVID vaccine should not affect her COVID test that she is scheduled to have done today.

## 2019-05-31 NOTE — Telephone Encounter (Signed)
My Chart letter sent with detailed instructions for stress test scheduled for 06/04/19 at 8:00. Lauren Hall

## 2019-05-31 NOTE — Telephone Encounter (Signed)
New Message     Pt is calling and says she had her second covid vaccine on Sat. 05/26/19 She says she had aching bones and muscles and didn't feel good. She is feeling better now but is wondering if the vaccine will affect her covid test she is suppose to have before her stress test     Please call back

## 2019-06-01 ENCOUNTER — Telehealth: Payer: 59 | Admitting: Family Medicine

## 2019-06-04 ENCOUNTER — Other Ambulatory Visit: Payer: Self-pay

## 2019-06-04 ENCOUNTER — Ambulatory Visit (HOSPITAL_COMMUNITY): Payer: 59 | Attending: Cardiology

## 2019-06-04 DIAGNOSIS — R0602 Shortness of breath: Secondary | ICD-10-CM | POA: Diagnosis not present

## 2019-06-04 LAB — MYOCARDIAL PERFUSION IMAGING
LV dias vol: 38 mL (ref 46–106)
LV sys vol: 11 mL
Peak HR: 122 {beats}/min
Rest HR: 108 {beats}/min
SDS: 3
SRS: 0
SSS: 3
TID: 0.98

## 2019-06-04 MED ORDER — TECHNETIUM TC 99M TETROFOSMIN IV KIT
31.4000 | PACK | Freq: Once | INTRAVENOUS | Status: AC | PRN
  Administered 2019-06-04: 31.4 via INTRAVENOUS
  Filled 2019-06-04: qty 32

## 2019-06-04 MED ORDER — REGADENOSON 0.4 MG/5ML IV SOLN
0.4000 mg | Freq: Once | INTRAVENOUS | Status: AC
  Administered 2019-06-04: 0.4 mg via INTRAVENOUS

## 2019-06-04 MED ORDER — AMINOPHYLLINE 25 MG/ML IV SOLN
75.0000 mg | Freq: Once | INTRAVENOUS | Status: AC
  Administered 2019-06-04: 75 mg via INTRAVENOUS

## 2019-06-04 MED ORDER — TECHNETIUM TC 99M TETROFOSMIN IV KIT
10.1000 | PACK | Freq: Once | INTRAVENOUS | Status: AC | PRN
  Administered 2019-06-04: 10.1 via INTRAVENOUS
  Filled 2019-06-04: qty 11

## 2019-06-05 ENCOUNTER — Other Ambulatory Visit: Payer: Self-pay | Admitting: Family Medicine

## 2019-06-05 ENCOUNTER — Encounter: Payer: Self-pay | Admitting: Family Medicine

## 2019-06-05 ENCOUNTER — Telehealth: Payer: Self-pay

## 2019-06-05 NOTE — Telephone Encounter (Signed)
-----   Message from Berniece Salines, DO sent at 06/05/2019 12:42 PM EDT ----- Normal study

## 2019-06-05 NOTE — Telephone Encounter (Signed)
Spoke with patient regarding results.  Patient verbalizes understanding and is agreeable to plan of care. Advised patient to call back with any issues or concerns.  

## 2019-06-07 ENCOUNTER — Ambulatory Visit: Payer: 59 | Admitting: Psychology

## 2019-06-08 ENCOUNTER — Other Ambulatory Visit: Payer: Self-pay

## 2019-06-08 ENCOUNTER — Telehealth (INDEPENDENT_AMBULATORY_CARE_PROVIDER_SITE_OTHER): Payer: 59 | Admitting: Family Medicine

## 2019-06-08 DIAGNOSIS — R739 Hyperglycemia, unspecified: Secondary | ICD-10-CM | POA: Diagnosis not present

## 2019-06-08 DIAGNOSIS — F4321 Adjustment disorder with depressed mood: Secondary | ICD-10-CM | POA: Diagnosis not present

## 2019-06-08 DIAGNOSIS — I10 Essential (primary) hypertension: Secondary | ICD-10-CM

## 2019-06-08 DIAGNOSIS — E039 Hypothyroidism, unspecified: Secondary | ICD-10-CM

## 2019-06-08 DIAGNOSIS — R000 Tachycardia, unspecified: Secondary | ICD-10-CM

## 2019-06-08 MED ORDER — VENLAFAXINE HCL ER 75 MG PO CP24: ORAL_CAPSULE | ORAL | 1 refills | Status: DC

## 2019-06-08 NOTE — Assessment & Plan Note (Signed)
On Levothyroxine, continue to monitor 

## 2019-06-08 NOTE — Assessment & Plan Note (Signed)
She is still struggling with the grief of loosing her mother but is managing some better. She is currently at her mother's house cleaning things up presently. Is less labile than last visit. No changes

## 2019-06-08 NOTE — Assessment & Plan Note (Signed)
hgba1c acceptable, minimize simple carbs. Increase exercise as tolerated.  

## 2019-06-08 NOTE — Assessment & Plan Note (Signed)
She is following with cardiology and has an appt later this month, mildly elevated, no changes to meds. Encouraged heart healthy diet such as the DASH diet and exercise as tolerated.

## 2019-06-11 NOTE — Progress Notes (Signed)
Virtual Visit via Video Note  I connected with Lauren Hall on 06/08/19 at  9:40 AM EDT by a video enabled telemedicine application and verified that I am speaking with the correct person using two identifiers.  Location: Patient: home Provider: home   I discussed the limitations of evaluation and management by telemedicine and the availability of in person appointments. The patient expressed understanding and agreed to proceed. Kem Boroughs, [name] was able to set the patient up on visit, video    Subjective:    Patient ID: Lauren Hall, female    DOB: August 07, 1970, 49 y.o.   MRN: [id number]  Chief Complaint  Patient presents with  . Follow-up    HPI Patient is in today for follow up on chronic medical concerns. No recent febrile illness or hospitalizations. She is still grieving the recent loss of her mother but is doing some better. She is established with psychiatry and she is starting to feel better. She is noting less palpitations and is established with cardiology and wearing a heart monitor now. Denies CP/SOB/HA/congestion/fevers/GI or GU c/o. Taking meds as prescribed  Past Medical History:  Diagnosis Date  . Allergy   . Anemia   . Anxiety   . Bronchitis 07/27/2011  . 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 Complication of anesthesia    patient woke up while they were sewing up her incision with breast lumpectomy  . Depression   . Dysrhythmia    Tachycardia  . Enlarged thyroid   . Fatigue 10/08/2010  . Fibromyalgia 2009  . GERD (gastroesophageal reflux disease) 09/26/2013  . Headache(784.0) 10/08/2010   Migraines  . Hip pain, bilateral 11/06/2010  . Hyperglycemia 08/11/2015   normal Hgb A 1C per patient  . Hypertension   . Low back pain radiating to right leg 03/11/2015  . Other and unspecified hyperlipidemia 07/29/2012  . Peripheral neuropathy 11/11/2012   burning sensation mainly right leg  . Poor concentration 01/11/2011  . Preventative health care 07/29/2012  .  Sinusitis, acute 07/27/2011  . Tachycardia   . Thyroid disease 10/08/2010  . Tick bite of flank 11/09/2011  . Tinea corporis 11/11/2012  . Uterine leiomyoma     Past Surgical History:  Procedure Laterality Date  . BREAST LUMPECTOMY Right 1999   benign   . CYSTOSCOPY N/A 10/07/2015   Procedure: CYSTOSCOPY;  Surgeon: Sanjuana Kava, MD;  Location: [facility];  Service: Gynecology;  Laterality: N/A;    Family History  Problem Relation Age of Onset  . Fibromyalgia Mother   . Diabetes Mother        type 2  . Hypertension Mother   . Hyperlipidemia Mother   . Arthritis Mother   . Alcohol abuse Father   . Colon cancer Father   . Hypertension Brother   . Alcohol abuse Brother   . Heart disease Maternal Grandmother   . Hyperlipidemia Maternal Grandmother   . Diabetes Maternal Grandmother        type 2  . Colon cancer Maternal Grandmother   . Stroke Maternal Grandfather   . Diabetes Maternal Grandfather        type 2  . Ovarian cancer Paternal Grandmother   . Alzheimer's disease Paternal Grandfather     Social History   Socioeconomic History  . Marital status: Married    Spouse name: Montine Circle  . Number of children: Not on file  . Years of education: Not on file  . Highest education level: Not on file  Occupational History  .  Not on file  Tobacco Use  . Smoking status: Never Smoker  . Smokeless tobacco: Never Used  Substance and Sexual Activity  . Alcohol use: Yes    Comment: special occassion- very rarely  . Drug use: No  . Sexual activity: Yes    Partners: Male    Birth control/protection: None  Other Topics Concern  . Not on file  Social History Narrative  . Not on file   Social Determinants of Health   Financial Resource Strain:   . Difficulty of Paying Living Expenses:   Food Insecurity:   . Worried About Charity fundraiser in the Last Year:   . Arboriculturist in the Last Year:   Transportation Needs:   . Film/video editor (Medical):   Marland Kitchen Lack of  Transportation (Non-Medical):   Physical Activity:   . Days of Exercise per Week:   . Minutes of Exercise per Session:   Stress:   . Feeling of Stress :   Social Connections:   . Frequency of Communication with Friends and Family:   . Frequency of Social Gatherings with Friends and Family:   . Attends Religious Services:   . Active Member of Clubs or Organizations:   . Attends Archivist Meetings:   Marland Kitchen Marital Status:   Intimate Partner Violence:   . Fear of Current or Ex-Partner:   . Emotionally Abused:   Marland Kitchen Physically Abused:   . Sexually Abused:     Outpatient Medications Prior to Visit  Medication Sig Dispense Refill  . ARIPiprazole (ABILIFY) 5 MG tablet Take 1 tablet (5 mg total) by mouth daily. 30 tablet 2  . atorvastatin (LIPITOR) 10 MG tablet Take 0.5 tablets (5 mg total) by mouth daily. 45 tablet 1  . busPIRone (BUSPAR) 5 MG tablet Take 1 tablet (5 mg total) by mouth 3 (three) times daily. 90 tablet 0  . cyclobenzaprine (FLEXERIL) 10 MG tablet Take 0.5-1 tablets (5-10 mg total) by mouth 3 (three) times daily as needed for muscle spasms. 90 tablet 2  . diltiazem (CARDIZEM CD) 180 MG 24 hr capsule Take 1 capsule (180 mg total) by mouth daily. 30 capsule 1  . gabapentin (NEURONTIN) 300 MG capsule TAKE 1 CAPSULE BY MOUTH TWICE A DAY AND 2 CAPSULES AT BEDTIME 360 capsule 1  . levothyroxine (SYNTHROID) 25 MCG tablet TAKE 1 TABLET(25 MCG) BY MOUTH DAILY BEFORE BREAKFAST 90 tablet 1  . lisinopril (ZESTRIL) 10 MG tablet TAKE 1 TABLET(10 MG) BY MOUTH TWICE DAILY 180 tablet 1  . LORazepam (ATIVAN) 0.5 MG tablet Take 1 tablet (0.5 mg total) by mouth 2 (two) times daily. 30 tablet 2  . omeprazole (PRILOSEC) 40 MG capsule Take 1 capsule (40 mg total) by mouth daily. 90 capsule 1  . ondansetron (ZOFRAN) 4 MG tablet Take 1 tablet (4 mg total) by mouth every 8 (eight) hours as needed for nausea or vomiting. 20 tablet 1  . venlafaxine XR (EFFEXOR-XR) 75 MG 24 hr capsule TAKE 1 CAPSULE  BY MOUTH EVERYDAY AT BEDTIME 90 capsule 0   No facility-administered medications prior to visit.    Allergies  Allergen Reactions  . Promethazine Anaphylaxis    Review of Systems  Constitutional: Negative for fever and malaise/fatigue.  HENT: Negative for congestion.   Eyes: Negative for blurred vision.  Respiratory: Negative for shortness of breath.   Cardiovascular: Positive for palpitations. Negative for chest pain and leg swelling.  Gastrointestinal: Negative for abdominal pain, blood in stool and  nausea.  Genitourinary: Negative for dysuria and frequency.  Musculoskeletal: Negative for falls.  Skin: Negative for rash.  Neurological: Negative for dizziness, loss of consciousness and headaches.  Endo/Heme/Allergies: Negative for environmental allergies.  Psychiatric/Behavioral: Positive for depression. The patient is nervous/anxious.        Objective:    Physical Exam Constitutional:      Appearance: Normal appearance. She is not ill-appearing.  HENT:     Head: Normocephalic and atraumatic.     Right Ear: External ear normal.     Left Ear: External ear normal.     Nose: Nose normal.  Cardiovascular:     Pulses: Normal pulses.  Pulmonary:     Effort: Pulmonary effort is normal.  Musculoskeletal:        General: Normal range of motion.  Neurological:     Mental Status: She is alert and oriented to person, place, and time.  Psychiatric:        Behavior: Behavior normal.     BP (!) 147/93   Pulse 96   Temp 98.5 F (36.9 C)   Wt 157 lb (71.2 kg)   LMP 09/02/2015 (Approximate) Comment: continous bleeding  SpO2 98%   BMI 31.18 kg/m  Wt Readings from Last 3 Encounters:  06/08/19 157 lb (71.2 kg)  06/04/19 157 lb (71.2 kg)  05/24/19 157 lb (71.2 kg)    Diabetic Foot Exam - Simple   No data filed     Lab Results  Component Value Date   WBC 4.9 05/08/2019   HGB 13.1 05/08/2019   HCT 39.0 05/08/2019   PLT 351.0 05/08/2019   GLUCOSE 85 05/08/2019    CHOL 206 (H) 05/08/2019   TRIG 177.0 (H) 05/08/2019   HDL 67.60 05/08/2019   LDLCALC 103 (H) 05/08/2019   ALT 27 05/08/2019   AST 23 05/08/2019   NA 139 05/08/2019   K 4.4 05/08/2019   CL 101 05/08/2019   CREATININE 0.78 05/08/2019   BUN 5 (L) 05/08/2019   CO2 30 05/08/2019   TSH 0.46 05/08/2019   HGBA1C 5.7 05/08/2019    Lab Results  Component Value Date   TSH 0.46 05/08/2019   Lab Results  Component Value Date   WBC 4.9 05/08/2019   HGB 13.1 05/08/2019   HCT 39.0 05/08/2019   MCV 92.5 05/08/2019   PLT 351.0 05/08/2019   Lab Results  Component Value Date   NA 139 05/08/2019   K 4.4 05/08/2019   CO2 30 05/08/2019   GLUCOSE 85 05/08/2019   BUN 5 (L) 05/08/2019   CREATININE 0.78 05/08/2019   BILITOT 0.3 05/08/2019   ALKPHOS 122 (H) 05/08/2019   AST 23 05/08/2019   ALT 27 05/08/2019   PROT 7.1 05/08/2019   ALBUMIN 4.4 05/08/2019   CALCIUM 9.4 05/08/2019   ANIONGAP 12 01/17/2018   GFR 94.42 05/08/2019   Lab Results  Component Value Date   CHOL 206 (H) 05/08/2019   Lab Results  Component Value Date   HDL 67.60 05/08/2019   Lab Results  Component Value Date   LDLCALC 103 (H) 05/08/2019   Lab Results  Component Value Date   TRIG 177.0 (H) 05/08/2019   Lab Results  Component Value Date   CHOLHDL 3 05/08/2019   Lab Results  Component Value Date   HGBA1C 5.7 05/08/2019       Assessment & Plan:   Problem List Items Addressed This Visit    Hypertension    She is following with cardiology and  has an appt later this month, mildly elevated, no changes to meds. Encouraged heart healthy diet such as the DASH diet and exercise as tolerated.       Hypothyroidism    On Levothyroxine, continue to monitor      Tachycardia    Is currently following wieht Dr Harriet Masson of cardiology and is wearing a heart monitor to evaluate      Hyperglycemia    hgba1c acceptable, minimize simple carbs. Increase exercise as tolerated.       Grief reaction    She is still  struggling with the grief of loosing her mother but is managing some better. She is currently at her mother's house cleaning things up presently. Is less labile than last visit. No changes         I am having Lauren Hall maintain her ondansetron, cyclobenzaprine, omeprazole, busPIRone, atorvastatin, levothyroxine, LORazepam, ARIPiprazole, lisinopril, diltiazem, gabapentin, and venlafaxine XR.  Meds ordered this encounter  Medications  . venlafaxine XR (EFFEXOR-XR) 75 MG 24 hr capsule    Sig: TAKE 1 CAPSULE BY MOUTH EVERYDAY AT BEDTIME    Dispense:  90 capsule    Refill:  1     I discussed the assessment and treatment plan with the patient. The patient was provided an opportunity to ask questions and all were answered. The patient agreed with the plan and demonstrated an understanding of the instructions.   The patient was advised to call back or seek an in-person evaluation if the symptoms worsen or if the condition fails to improve as anticipated.  I provided 20 minutes of non-face-to-face time during this encounter.   Penni Homans, MD

## 2019-06-11 NOTE — Assessment & Plan Note (Signed)
Is currently following wieht Dr Harriet Masson of cardiology and is wearing a heart monitor to evaluate

## 2019-06-14 ENCOUNTER — Ambulatory Visit (INDEPENDENT_AMBULATORY_CARE_PROVIDER_SITE_OTHER): Payer: 59 | Admitting: Psychology

## 2019-06-14 DIAGNOSIS — F411 Generalized anxiety disorder: Secondary | ICD-10-CM

## 2019-06-21 ENCOUNTER — Ambulatory Visit (INDEPENDENT_AMBULATORY_CARE_PROVIDER_SITE_OTHER): Payer: 59 | Admitting: Psychology

## 2019-06-21 DIAGNOSIS — F411 Generalized anxiety disorder: Secondary | ICD-10-CM | POA: Diagnosis not present

## 2019-06-27 ENCOUNTER — Encounter: Payer: Self-pay | Admitting: Cardiology

## 2019-06-27 ENCOUNTER — Ambulatory Visit (INDEPENDENT_AMBULATORY_CARE_PROVIDER_SITE_OTHER): Payer: 59 | Admitting: Cardiology

## 2019-06-27 ENCOUNTER — Other Ambulatory Visit: Payer: Self-pay

## 2019-06-27 VITALS — BP 152/88 | HR 108 | Ht 59.5 in | Wt 163.0 lb

## 2019-06-27 DIAGNOSIS — E782 Mixed hyperlipidemia: Secondary | ICD-10-CM | POA: Diagnosis not present

## 2019-06-27 DIAGNOSIS — F4321 Adjustment disorder with depressed mood: Secondary | ICD-10-CM

## 2019-06-27 DIAGNOSIS — I10 Essential (primary) hypertension: Secondary | ICD-10-CM | POA: Diagnosis not present

## 2019-06-27 MED ORDER — DILTIAZEM HCL ER COATED BEADS 240 MG PO CP24: 240.0000 mg | ORAL_CAPSULE | Freq: Every day | ORAL | 3 refills | Status: DC

## 2019-06-27 NOTE — Patient Instructions (Signed)
Medication Instructions:  Your physician has recommended you make the following change in your medication:  1.  INCREASE Cardizem to 240 mg taking 1 daily  *If you need a refill on your cardiac medications before your next appointment, please call your pharmacy*   Lab Work: None ordered  If you have labs (blood work) drawn today and your tests are completely normal, you will receive your results only by: Marland Kitchen MyChart Message (if you have MyChart) OR . A paper copy in the mail If you have any lab test that is abnormal or we need to change your treatment, we will call you to review the results.   Testing/Procedures: None ordered   Follow-Up: At [facility], you and your health needs are our priority.  As part of our continuing mission to provide you with exceptional heart care, we have created designated Provider Care Teams.  These Care Teams include your primary Cardiologist (physician) and Advanced Practice Providers (APPs -  Physician Assistants and Nurse Practitioners) who all work together to provide you with the care you need, when you need it.  We recommend signing up for the patient portal called "MyChart".  Sign up information is provided on this After Visit Summary.  MyChart is used to connect with patients for Virtual Visits (Telemedicine).  Patients are able to view lab/test results, encounter notes, upcoming appointments, etc.  Non-urgent messages can be sent to your provider as well.   To learn more about what you can do with MyChart, go to [email or web address].    Your next appointment:   2 month(s)  The format for your next appointment:   In Person  Provider:   Berniece Salines, DO   Other Instructions

## 2019-06-27 NOTE — Progress Notes (Signed)
Cardiology Office Note:    Date:  06/27/2019   ID:  Lauren Hall, DOB [birth year], MRN [id number]  PCP:  Mosie Lukes, MD  Cardiologist:  Berniece Salines, DO  Electrophysiologist:  None   Referring MD: Mosie Lukes, MD   Chief Complaint  Patient presents with  . Follow-up   " My blood pressure is improving, heart rate seems to be still high"  History of Present Illness:    Lauren Hall is a 49 y.o. female with a hx of hyperlipidemia, hypertension presented initially May 24, 2019 to be evaluated for palpitations and shortness of breath.  During her visit she shared with me that he had just recently lost her mother to an acute myocardial infarction. At the conclusion of the visit, I started patient on Cardizem 180 mg daily Started on lisinopril please start 7-day monitor as well as ordered nuclear stress test.  In the interim the patient has been able to get her testing done, she does tell me she sent back her monitor but this had not been received by [place] yet.  Her nuclear test this was normal.  Today  Today she notes that she has her challenges as things have been tough at home taking care of her stepfather but she is hanging in there.  She also has been doing some therapy to help her with her grief process.  Past Medical History:  Diagnosis Date  . Allergy   . Anemia   . Anxiety   . Bronchitis 07/27/2011  . 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 Complication of anesthesia    patient woke up while they were sewing up her incision with breast lumpectomy  . Depression   . Dysrhythmia    Tachycardia  . Enlarged thyroid   . Fatigue 10/08/2010  . Fibromyalgia 2009  . GERD (gastroesophageal reflux disease) 09/26/2013  . Headache(784.0) 10/08/2010   Migraines  . Hip pain, bilateral 11/06/2010  . Hyperglycemia 08/11/2015   normal Hgb A 1C per patient  . Hypertension   . Low back pain radiating to right leg 03/11/2015  . Other and unspecified hyperlipidemia 07/29/2012  . Peripheral  neuropathy 11/11/2012   burning sensation mainly right leg  . Poor concentration 01/11/2011  . Preventative health care 07/29/2012  . Sinusitis, acute 07/27/2011  . Tachycardia   . Thyroid disease 10/08/2010  . Tick bite of flank 11/09/2011  . Tinea corporis 11/11/2012  . Uterine leiomyoma     Past Surgical History:  Procedure Laterality Date  . BREAST LUMPECTOMY Right 1999   benign   . CYSTOSCOPY N/A 10/07/2015   Procedure: CYSTOSCOPY;  Surgeon: Sanjuana Kava, MD;  Location: [facility];  Service: Gynecology;  Laterality: N/A;    Current Medications: Current Meds  Medication Sig  . ARIPiprazole (ABILIFY) 5 MG tablet Take 1 tablet (5 mg total) by mouth daily.  Marland Kitchen atorvastatin (LIPITOR) 10 MG tablet Take 0.5 tablets (5 mg total) by mouth daily.  . busPIRone (BUSPAR) 5 MG tablet Take 1 tablet (5 mg total) by mouth 3 (three) times daily.  . cyclobenzaprine (FLEXERIL) 10 MG tablet Take 0.5-1 tablets (5-10 mg total) by mouth 3 (three) times daily as needed for muscle spasms.  Marland Kitchen gabapentin (NEURONTIN) 300 MG capsule TAKE 1 CAPSULE BY MOUTH TWICE A DAY AND 2 CAPSULES AT BEDTIME  . levothyroxine (SYNTHROID) 25 MCG tablet TAKE 1 TABLET(25 MCG) BY MOUTH DAILY BEFORE BREAKFAST  . lisinopril (ZESTRIL) 10 MG tablet TAKE 1 TABLET(10 MG) BY MOUTH TWICE DAILY  .  LORazepam (ATIVAN) 0.5 MG tablet Take 1 tablet (0.5 mg total) by mouth 2 (two) times daily.  . LOW-DOSE ASPIRIN PO   . omeprazole (PRILOSEC) 40 MG capsule Take 1 capsule (40 mg total) by mouth daily.  . ondansetron (ZOFRAN) 4 MG tablet Take 1 tablet (4 mg total) by mouth every 8 (eight) hours as needed for nausea or vomiting.  . venlafaxine XR (EFFEXOR-XR) 75 MG 24 hr capsule TAKE 1 CAPSULE BY MOUTH EVERYDAY AT BEDTIME  . [DISCONTINUED] diltiazem (CARDIZEM CD) 180 MG 24 hr capsule Take 1 capsule (180 mg total) by mouth daily.     Allergies:   Promethazine   Social History   Socioeconomic History  . Marital status: Married    Spouse name: Montine Circle    . Number of children: Not on file  . Years of education: Not on file  . Highest education level: Not on file  Occupational History  . Not on file  Tobacco Use  . Smoking status: Never Smoker  . Smokeless tobacco: Never Used  Substance and Sexual Activity  . Alcohol use: Yes    Comment: special occassion- very rarely  . Drug use: No  . Sexual activity: Yes    Partners: Male    Birth control/protection: None  Other Topics Concern  . Not on file  Social History Narrative  . Not on file   Social Determinants of Health   Financial Resource Strain:   . Difficulty of Paying Living Expenses:   Food Insecurity:   . Worried About Charity fundraiser in the Last Year:   . Arboriculturist in the Last Year:   Transportation Needs:   . Film/video editor (Medical):   Marland Kitchen Lack of Transportation (Non-Medical):   Physical Activity:   . Days of Exercise per Week:   . Minutes of Exercise per Session:   Stress:   . Feeling of Stress :   Social Connections:   . Frequency of Communication with Friends and Family:   . Frequency of Social Gatherings with Friends and Family:   . Attends Religious Services:   . Active Member of Clubs or Organizations:   . Attends Archivist Meetings:   Marland Kitchen Marital Status:      Family History: The patient's family history includes Alcohol abuse in her brother and father; Alzheimer's disease in her paternal grandfather; Arthritis in her mother; Colon cancer in her father and maternal grandmother; Diabetes in her maternal grandfather, maternal grandmother, and mother; Fibromyalgia in her mother; Heart disease in her maternal grandmother; Hyperlipidemia in her maternal grandmother and mother; Hypertension in her brother and mother; Ovarian cancer in her paternal grandmother; Stroke in her maternal grandfather.  ROS:   Review of Systems  Constitution: Negative for decreased appetite, fever and weight gain.  HENT: Negative for congestion, ear discharge,  hoarse voice and sore throat.   Eyes: Negative for discharge, redness, vision loss in right eye and visual halos.  Cardiovascular: Negative for chest pain, dyspnea on exertion, leg swelling, orthopnea and palpitations.  Respiratory: Negative for cough, hemoptysis, shortness of breath and snoring.   Endocrine: Negative for heat intolerance and polyphagia.  Hematologic/Lymphatic: Negative for bleeding problem. Does not bruise/bleed easily.  Skin: Negative for flushing, nail changes, rash and suspicious lesions.  Musculoskeletal: Negative for arthritis, joint pain, muscle cramps, myalgias, neck pain and stiffness.  Gastrointestinal: Negative for abdominal pain, bowel incontinence, diarrhea and excessive appetite.  Genitourinary: Negative for decreased libido, genital sores and incomplete emptying.  Neurological: Negative for brief paralysis, focal weakness, headaches and loss of balance.  Psychiatric/Behavioral: Negative for altered mental status, depression and suicidal ideas.  Allergic/Immunologic: Negative for HIV exposure and persistent infections.    EKGs/Labs/Other Studies Reviewed:    The following studies were reviewed today:   EKG: None today  06/04/2019  Lexiscan: No significant ST changes to suggest ischemia  Myovue scan with normal perfusion  LVEF 70%  Low risk study   TTE IMPRESSIONS May 09, 2019 1. Left ventricular ejection fraction, by estimation, is 55 to 60%. The  left ventricle has normal function. The left ventricle has no regional  wall motion abnormalities. There is mild concentric left ventricular hypertrophy. Left ventricular diastolic parameters are consistent with Grade I diastolic dysfunction (impaired  relaxation).  2. Right ventricular systolic function is normal. The right ventricular  size is normal.  3. The mitral valve is normal in structure. No evidence of mitral valve  regurgitation. No evidence of mitral stenosis.  4. The aortic valve is  tricuspid. Aortic valve regurgitation is not  visualized. No aortic stenosis is present.  5. The inferior vena cava is normal in size with greater than 50%  respiratory variability, suggesting right atrial pressure of 3 mmHg.   Recent Labs: 05/08/2019: ALT 27; BUN 5; Creatinine, Ser 0.78; Hemoglobin 13.1; Platelets 351.0; Potassium 4.4; Sodium 139; TSH 0.46  Recent Lipid Panel    Component Value Date/Time   CHOL 206 (H) 05/08/2019 1517   TRIG 177.0 (H) 05/08/2019 1517   HDL 67.60 05/08/2019 1517   CHOLHDL 3 05/08/2019 1517   VLDL 35.4 05/08/2019 1517   LDLCALC 103 (H) 05/08/2019 1517    Physical Exam:    VS:  BP (!) 152/88   Pulse (!) 108   Ht 4' 11.5" (1.511 m)   Wt 163 lb (73.9 kg)   LMP 09/02/2015 (Approximate) Comment: continous bleeding  SpO2 98%   BMI 32.37 kg/m     Wt Readings from Last 3 Encounters:  06/27/19 163 lb (73.9 kg)  06/08/19 157 lb (71.2 kg)  06/04/19 157 lb (71.2 kg)     GEN: Well nourished, well developed in no acute distress HEENT: Normal NECK: No JVD; No carotid bruits LYMPHATICS: No lymphadenopathy CARDIAC: S1S2 noted,RRR, no murmurs, rubs, gallops RESPIRATORY:  Clear to auscultation without rales, wheezing or rhonchi  ABDOMEN: Soft, non-tender, non-distended, +bowel sounds, no guarding. EXTREMITIES: No edema, No cyanosis, no clubbing MUSCULOSKELETAL:  No deformity  SKIN: Warm and dry NEUROLOGIC:  Alert and oriented x 3, non-focal PSYCHIATRIC:  Normal affect, good insight  ASSESSMENT:    1. Essential hypertension   2. Mixed hyperlipidemia   3. Grief reaction    PLAN:     Her blood pressure is improving but she still is hypertensive.  And notes that the palpitations are still increasing.  I am going to increase her Cardizem from 180 mg daily to 240 mg daily.  She will remain on her lisinopril 10 mg daily as well We have to investigate about her ZIO monitor as we have not received this monitor yet and the patient tells me that she did  send it back.  Hyperlipidemia-Continue patient on her Lipitor 10 mg daily.  She is undergoing therapy for her recent loss and I think this is probably helping her.  The patient is in agreement with the above plan. The patient left the office in stable condition.  The patient will follow up in 3 months or sooner if needed.   Medication  Adjustments/Labs and Tests Ordered: Current medicines are reviewed at length with the patient today.  Concerns regarding medicines are outlined above.  No orders of the defined types were placed in this encounter.  Meds ordered this encounter  Medications  . diltiazem (CARDIZEM CD) 240 MG 24 hr capsule    Sig: Take 1 capsule (240 mg total) by mouth daily.    Dispense:  90 capsule    Refill:  3    Patient Instructions  Medication Instructions:  Your physician has recommended you make the following change in your medication:  1.  INCREASE Cardizem to 240 mg taking 1 daily  *If you need a refill on your cardiac medications before your next appointment, please call your pharmacy*   Lab Work: None ordered  If you have labs (blood work) drawn today and your tests are completely normal, you will receive your results only by: Marland Kitchen MyChart Message (if you have MyChart) OR . A paper copy in the mail If you have any lab test that is abnormal or we need to change your treatment, we will call you to review the results.   Testing/Procedures: None ordered   Follow-Up: At [facility], you and your health needs are our priority.  As part of our continuing mission to provide you with exceptional heart care, we have created designated Provider Care Teams.  These Care Teams include your primary Cardiologist (physician) and Advanced Practice Providers (APPs -  Physician Assistants and Nurse Practitioners) who all work together to provide you with the care you need, when you need it.  We recommend signing up for the patient portal called "MyChart".  Sign up  information is provided on this After Visit Summary.  MyChart is used to connect with patients for Virtual Visits (Telemedicine).  Patients are able to view lab/test results, encounter notes, upcoming appointments, etc.  Non-urgent messages can be sent to your provider as well.   To learn more about what you can do with MyChart, go to [email or web address].    Your next appointment:   2 month(s)  The format for your next appointment:   In Person  Provider:   Berniece Salines, DO   Other Instructions      Adopting a Healthy Lifestyle.  Know what a healthy weight is for you (roughly BMI <25) and aim to maintain this   Aim for 7+ servings of fruits and vegetables daily   65-80+ fluid ounces of water or unsweet tea for healthy kidneys   Limit to max 1 drink of alcohol per day; avoid smoking/tobacco   Limit animal fats in diet for cholesterol and heart health - choose grass fed whenever available   Avoid highly processed foods, and foods high in saturated/trans fats   Aim for low stress - take time to unwind and care for your mental health   Aim for 150 min of moderate intensity exercise weekly for heart health, and weights twice weekly for bone health   Aim for 7-9 hours of sleep daily   When it comes to diets, agreement about the perfect plan isnt easy to find, even among the experts. Experts at the [employer] developed an idea known as the Healthy Eating Plate. Just imagine a plate divided into logical, healthy portions.   The emphasis is on diet quality:   Load up on vegetables and fruits - one-half of your plate: Aim for color and variety, and remember that potatoes dont count.   Go for whole grains -  one-quarter of your plate: Whole wheat, barley, wheat berries, quinoa, oats, brown rice, and foods made with them. If you want pasta, go with whole wheat pasta.   Protein power - one-quarter of your plate: Fish, chicken, beans, and nuts are all healthy,  versatile protein sources. Limit red meat.   The diet, however, does go beyond the plate, offering a few other suggestions.   Use healthy plant oils, such as olive, canola, soy, corn, sunflower and peanut. Check the labels, and avoid partially hydrogenated oil, which have unhealthy trans fats.   If youre thirsty, drink water. Coffee and tea are good in moderation, but skip sugary drinks and limit milk and dairy products to one or two daily servings.   The type of carbohydrate in the diet is more important than the amount. Some sources of carbohydrates, such as vegetables, fruits, whole grains, and beans-are healthier than others.   Finally, stay active  Signed, Berniece Salines, DO  06/27/2019 3:54 PM    [facility]

## 2019-06-28 ENCOUNTER — Ambulatory Visit (INDEPENDENT_AMBULATORY_CARE_PROVIDER_SITE_OTHER): Payer: 59 | Admitting: Psychology

## 2019-06-28 DIAGNOSIS — F411 Generalized anxiety disorder: Secondary | ICD-10-CM | POA: Diagnosis not present

## 2019-07-02 ENCOUNTER — Other Ambulatory Visit: Payer: Self-pay | Admitting: Family Medicine

## 2019-07-05 ENCOUNTER — Ambulatory Visit (INDEPENDENT_AMBULATORY_CARE_PROVIDER_SITE_OTHER): Payer: 59 | Admitting: Psychology

## 2019-07-05 DIAGNOSIS — F411 Generalized anxiety disorder: Secondary | ICD-10-CM | POA: Diagnosis not present

## 2019-07-12 ENCOUNTER — Ambulatory Visit (INDEPENDENT_AMBULATORY_CARE_PROVIDER_SITE_OTHER): Payer: 59 | Admitting: Psychology

## 2019-07-12 DIAGNOSIS — F411 Generalized anxiety disorder: Secondary | ICD-10-CM | POA: Diagnosis not present

## 2019-07-18 ENCOUNTER — Ambulatory Visit: Payer: 59 | Admitting: Psychology

## 2019-07-19 ENCOUNTER — Ambulatory Visit (INDEPENDENT_AMBULATORY_CARE_PROVIDER_SITE_OTHER): Payer: 59 | Admitting: Psychology

## 2019-07-19 DIAGNOSIS — F411 Generalized anxiety disorder: Secondary | ICD-10-CM | POA: Diagnosis not present

## 2019-07-23 ENCOUNTER — Ambulatory Visit: Payer: 59 | Admitting: Psychology

## 2019-07-26 ENCOUNTER — Ambulatory Visit: Payer: 59 | Admitting: Psychology

## 2019-07-30 ENCOUNTER — Encounter: Payer: Self-pay | Admitting: Adult Health

## 2019-07-30 ENCOUNTER — Telehealth (INDEPENDENT_AMBULATORY_CARE_PROVIDER_SITE_OTHER): Payer: 59 | Admitting: Adult Health

## 2019-07-30 DIAGNOSIS — F410 Panic disorder [episodic paroxysmal anxiety] without agoraphobia: Secondary | ICD-10-CM | POA: Diagnosis not present

## 2019-07-30 DIAGNOSIS — F411 Generalized anxiety disorder: Secondary | ICD-10-CM

## 2019-07-30 DIAGNOSIS — F331 Major depressive disorder, recurrent, moderate: Secondary | ICD-10-CM | POA: Diagnosis not present

## 2019-07-30 DIAGNOSIS — G4700 Insomnia, unspecified: Secondary | ICD-10-CM

## 2019-07-30 MED ORDER — LORAZEPAM 0.5 MG PO TABS: 0.5000 mg | ORAL_TABLET | Freq: Two times a day (BID) | ORAL | 2 refills | Status: DC

## 2019-07-30 MED ORDER — BUSPIRONE HCL 5 MG PO TABS: 5.0000 mg | ORAL_TABLET | Freq: Three times a day (TID) | ORAL | 2 refills | Status: DC

## 2019-07-30 MED ORDER — ARIPIPRAZOLE 5 MG PO TABS: 5.0000 mg | ORAL_TABLET | Freq: Every day | ORAL | 2 refills | Status: DC

## 2019-07-30 NOTE — Progress Notes (Signed)
Lauren Hall [id number] [birth year] 49 y.o.  Virtual Visit via Video Note  I connected with pt @ on 07/30/19 at  2:40 PM EDT by a video enabled telemedicine application and verified that I am speaking with the correct person using two identifiers.   I discussed the limitations of evaluation and management by telemedicine and the availability of in person appointments. The patient expressed understanding and agreed to proceed.  I discussed the assessment and treatment plan with the patient. The patient was provided an opportunity to ask questions and all were answered. The patient agreed with the plan and demonstrated an understanding of the instructions.   The patient was advised to call back or seek an in-person evaluation if the symptoms worsen or if the condition fails to improve as anticipated.  I provided 30 minutes of non-face-to-face time during this encounter.  The patient was located at home.  The provider was located at [place].   Aloha Gell, NP   Subjective:   Patient ID:  Lauren Hall is a 49 y.o. (DOB [birth year]) female.  Chief Complaint: No chief complaint on file.   HPI Lauren Hall presents for follow-up of MDD, GAD, insomnia, and panic attacks.  Describes mood today as "ok". Pleasant. Decreased tearfulness. Mood symptoms - reports decreased depression, anxiety, and irritability. Continues to grieve loss of mother - passed away recently. Feels like increase in Abilify from 2mg  to 5mg  has been helpful. Has been able to help stepfather and daughter with grief issues. Has a "stomach bug" currently and not feeling good overall. Has not been able to take her medications or keep things down. Decreased interest and motivation. Taking medications as prescribed.  Energy levels decreased. Active, does not have a regular exercise routine. 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 Enjoys some usual interests and activities. Married. Lives with husband of 20  years. Has one daughter - age 51.  Appetite decreased. Not able to eat anything currently.  Sleeps well most nights. Averages 6 hours. Focus and concentration generally "ok". Completing tasks. Managing some aspects of household.  Denies SI or HI. Denies AH or VH.  Previous medication trials: Pristiq, Wellbutrin, Prozac, Cymbalta, Lexapro, Zoloft,   Review of Systems:  Review of Systems  Musculoskeletal: Negative for gait problem.  Neurological: Negative for tremors.  Psychiatric/Behavioral:       Please refer to HPI    Medications: I have reviewed the patient's current medications.  Current Outpatient Medications  Medication Sig Dispense Refill  . ARIPiprazole (ABILIFY) 5 MG tablet Take 1 tablet (5 mg total) by mouth daily. 30 tablet 2  . atorvastatin (LIPITOR) 10 MG tablet Take 0.5 tablets (5 mg total) by mouth daily. 45 tablet 1  . busPIRone (BUSPAR) 5 MG tablet Take 1 tablet (5 mg total) by mouth 3 (three) times daily. 90 tablet 2  . cyclobenzaprine (FLEXERIL) 10 MG tablet TAKE 1/2 TO 1 TABLET(5 TO 10 MG) BY MOUTH THREE TIMES DAILY AS NEEDED FOR MUSCLE SPASMS 90 tablet 2  . diltiazem (CARDIZEM CD) 240 MG 24 hr capsule Take 1 capsule (240 mg total) by mouth daily. 90 capsule 3  . gabapentin (NEURONTIN) 300 MG capsule TAKE 1 CAPSULE BY MOUTH TWICE A DAY AND 2 CAPSULES AT BEDTIME 360 capsule 1  . levothyroxine (SYNTHROID) 25 MCG tablet TAKE 1 TABLET(25 MCG) BY MOUTH DAILY BEFORE BREAKFAST 90 tablet 1  . lisinopril (ZESTRIL) 10 MG tablet TAKE 1 TABLET(10 MG) BY MOUTH TWICE DAILY 180 tablet 1  .  LORazepam (ATIVAN) 0.5 MG tablet Take 1 tablet (0.5 mg total) by mouth 2 (two) times daily. 30 tablet 2  . LOW-DOSE ASPIRIN PO     . omeprazole (PRILOSEC) 40 MG capsule Take 1 capsule (40 mg total) by mouth daily. 90 capsule 1  . ondansetron (ZOFRAN) 4 MG tablet Take 1 tablet (4 mg total) by mouth every 8 (eight) hours as needed for nausea or vomiting. 20 tablet 1  . venlafaxine XR (EFFEXOR-XR)  75 MG 24 hr capsule TAKE 1 CAPSULE BY MOUTH EVERYDAY AT BEDTIME 90 capsule 1   No current facility-administered medications for this visit.    Medication Side Effects: None  Allergies:  Allergies  Allergen Reactions  . Promethazine Anaphylaxis    Past Medical History:  Diagnosis Date  . Allergy   . Anemia   . Anxiety   . Bronchitis 07/27/2011  . 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 Complication of anesthesia    patient woke up while they were sewing up her incision with breast lumpectomy  . Depression   . Dysrhythmia    Tachycardia  . Enlarged thyroid   . Fatigue 10/08/2010  . Fibromyalgia 2009  . GERD (gastroesophageal reflux disease) 09/26/2013  . Headache(784.0) 10/08/2010   Migraines  . Hip pain, bilateral 11/06/2010  . Hyperglycemia 08/11/2015   normal Hgb A 1C per patient  . Hypertension   . Low back pain radiating to right leg 03/11/2015  . Other and unspecified hyperlipidemia 07/29/2012  . Peripheral neuropathy 11/11/2012   burning sensation mainly right leg  . Poor concentration 01/11/2011  . Preventative health care 07/29/2012  . Sinusitis, acute 07/27/2011  . Tachycardia   . Thyroid disease 10/08/2010  . Tick bite of flank 11/09/2011  . Tinea corporis 11/11/2012  . Uterine leiomyoma     Family History  Problem Relation Age of Onset  . Fibromyalgia Mother   . Diabetes Mother        type 2  . Hypertension Mother   . Hyperlipidemia Mother   . Arthritis Mother   . Alcohol abuse Father   . Colon cancer Father   . Hypertension Brother   . Alcohol abuse Brother   . Heart disease Maternal Grandmother   . Hyperlipidemia Maternal Grandmother   . Diabetes Maternal Grandmother        type 2  . Colon cancer Maternal Grandmother   . Stroke Maternal Grandfather   . Diabetes Maternal Grandfather        type 2  . Ovarian cancer Paternal Grandmother   . Alzheimer's disease Paternal Grandfather     Social History   Socioeconomic History  . Marital status: Married     Spouse name: Montine Circle  . Number of children: Not on file  . Years of education: Not on file  . Highest education level: Not on file  Occupational History  . Not on file  Tobacco Use  . Smoking status: Never Smoker  . Smokeless tobacco: Never Used  Substance and Sexual Activity  . Alcohol use: Yes    Comment: special occassion- very rarely  . Drug use: No  . Sexual activity: Yes    Partners: Male    Birth control/protection: None  Other Topics Concern  . Not on file  Social History Narrative  . Not on file   Social Determinants of Health   Financial Resource Strain:   . Difficulty of Paying Living Expenses:   Food Insecurity:   . Worried About Charity fundraiser in the  Last Year:   . [place] in the Last Year:   Transportation Needs:   . Film/video editor (Medical):   Marland Kitchen Lack of Transportation (Non-Medical):   Physical Activity:   . Days of Exercise per Week:   . Minutes of Exercise per Session:   Stress:   . Feeling of Stress :   Social Connections:   . Frequency of Communication with Friends and Family:   . Frequency of Social Gatherings with Friends and Family:   . Attends Religious Services:   . Active Member of Clubs or Organizations:   . Attends Archivist Meetings:   Marland Kitchen Marital Status:   Intimate Partner Violence:   . Fear of Current or Ex-Partner:   . Emotionally Abused:   Marland Kitchen Physically Abused:   . Sexually Abused:     Past Medical History, Surgical history, Social history, and Family history were reviewed and updated as appropriate.   Please see review of systems for further details on the patient's review from today.   Objective:   Physical Exam:  LMP 09/02/2015 (Approximate) Comment: continous bleeding  Physical Exam Constitutional:      General: She is not in acute distress. Musculoskeletal:        General: No deformity.  Neurological:     Mental Status: She is alert and oriented to person, place, and time.      Coordination: Coordination normal.  Psychiatric:        Attention and Perception: Attention and perception normal. She does not perceive auditory or visual hallucinations.        Mood and Affect: Mood normal. Mood is not anxious or depressed. Affect is not labile, blunt, angry or inappropriate.        Speech: Speech normal.        Behavior: Behavior normal.        Thought Content: Thought content normal. Thought content is not paranoid or delusional. Thought content does not include homicidal or suicidal ideation. Thought content does not include homicidal or suicidal plan.        Cognition and Memory: Cognition and memory normal.        Judgment: Judgment normal.     Comments: Insight intact     Lab Review:     Component Value Date/Time   NA 139 05/08/2019 1517   K 4.4 05/08/2019 1517   CL 101 05/08/2019 1517   CO2 30 05/08/2019 1517   GLUCOSE 85 05/08/2019 1517   BUN 5 (L) 05/08/2019 1517   CREATININE 0.78 05/08/2019 1517   CREATININE 0.79 09/24/2013 1544   CALCIUM 9.4 05/08/2019 1517   PROT 7.1 05/08/2019 1517   ALBUMIN 4.4 05/08/2019 1517   AST 23 05/08/2019 1517   ALT 27 05/08/2019 1517   ALKPHOS 122 (H) 05/08/2019 1517   BILITOT 0.3 05/08/2019 1517   GFRNONAA >60 01/17/2018 0128   GFRAA >60 01/17/2018 0128       Component Value Date/Time   WBC 4.9 05/08/2019 1517   RBC 4.21 05/08/2019 1517   HGB 13.1 05/08/2019 1517   HCT 39.0 05/08/2019 1517   PLT 351.0 05/08/2019 1517   MCV 92.5 05/08/2019 1517   MCH 30.4 01/17/2018 0128   MCHC 33.7 05/08/2019 1517   RDW 13.8 05/08/2019 1517   LYMPHSABS 2.7 03/07/2012 1556   MONOABS 0.6 03/07/2012 1556   EOSABS 0.1 03/07/2012 1556   BASOSABS 0.0 03/07/2012 1556    No results found for: POCLITH, LITHIUM   No results  found for: PHENYTOIN, PHENOBARB, VALPROATE, CBMZ   .res Assessment: Plan:    Plan:  PDMP reviewed  1. Buspar 5mg  TID   2. Effexor XR 75mg  daily   3. Abilify 5 mg daily  4. Ativan 0.5mg  twice  daily  Therapist - Marya Fossa  RTC 4 weeks  Patient advised to contact office with any questions, adverse effects, or acute worsening in signs and symptoms.  Discussed potential benefits, risk, and side effects of benzodiazepines to include potential risk of tolerance and dependence, as well as possible drowsiness.  Advised patient not to drive if experiencing drowsiness and to take lowest possible effective dose to minimize risk of dependence and tolerance.  Discussed potential metabolic side effects associated with atypical antipsychotics, as well as potential risk for movement side effects. Advised pt to contact office if movement side effects occur.   Diagnoses and all orders for this visit:  Generalized anxiety disorder -     ARIPiprazole (ABILIFY) 5 MG tablet; Take 1 tablet (5 mg total) by mouth daily. -     LORazepam (ATIVAN) 0.5 MG tablet; Take 1 tablet (0.5 mg total) by mouth 2 (two) times daily.  Panic attacks -     LORazepam (ATIVAN) 0.5 MG tablet; Take 1 tablet (0.5 mg total) by mouth 2 (two) times daily.  Major depressive disorder, recurrent episode, moderate (HCC) -     ARIPiprazole (ABILIFY) 5 MG tablet; Take 1 tablet (5 mg total) by mouth daily.  Insomnia, unspecified type  Other orders -     busPIRone (BUSPAR) 5 MG tablet; Take 1 tablet (5 mg total) by mouth 3 (three) times daily.     Please see After Visit Summary for patient specific instructions.  Future Appointments  Date Time Provider [place]  08/09/2019 11:00 AM Gerri Lins [facility] None  08/09/2019 11:20 AM Mosie Lukes, MD [facility]  08/16/2019 11:00 AM Gerri Lins, [facility] None  08/23/2019 11:00 AM Gerri Lins [facility] None  08/30/2019 11:00 AM Gerri Lins, [facility] None  09/11/2019  1:20 PM Tobb, Kardie, DO [facility] None    No orders of the defined types were placed in this encounter.     -------------------------------

## 2019-08-02 ENCOUNTER — Ambulatory Visit: Payer: 59 | Admitting: Psychology

## 2019-08-09 ENCOUNTER — Ambulatory Visit (INDEPENDENT_AMBULATORY_CARE_PROVIDER_SITE_OTHER): Payer: 59 | Admitting: Psychology

## 2019-08-09 ENCOUNTER — Ambulatory Visit: Payer: 59 | Admitting: Family Medicine

## 2019-08-09 DIAGNOSIS — F411 Generalized anxiety disorder: Secondary | ICD-10-CM | POA: Diagnosis not present

## 2019-08-16 ENCOUNTER — Ambulatory Visit (INDEPENDENT_AMBULATORY_CARE_PROVIDER_SITE_OTHER): Payer: 59 | Admitting: Psychology

## 2019-08-16 DIAGNOSIS — F411 Generalized anxiety disorder: Secondary | ICD-10-CM

## 2019-08-23 ENCOUNTER — Ambulatory Visit: Payer: 59 | Admitting: Psychology

## 2019-08-30 ENCOUNTER — Ambulatory Visit (INDEPENDENT_AMBULATORY_CARE_PROVIDER_SITE_OTHER): Payer: 59 | Admitting: Psychology

## 2019-08-30 ENCOUNTER — Telehealth (INDEPENDENT_AMBULATORY_CARE_PROVIDER_SITE_OTHER): Payer: 59 | Admitting: Family Medicine

## 2019-08-30 ENCOUNTER — Other Ambulatory Visit: Payer: Self-pay

## 2019-08-30 DIAGNOSIS — F32A Depression, unspecified: Secondary | ICD-10-CM

## 2019-08-30 DIAGNOSIS — E039 Hypothyroidism, unspecified: Secondary | ICD-10-CM

## 2019-08-30 DIAGNOSIS — E782 Mixed hyperlipidemia: Secondary | ICD-10-CM | POA: Diagnosis not present

## 2019-08-30 DIAGNOSIS — F411 Generalized anxiety disorder: Secondary | ICD-10-CM | POA: Diagnosis not present

## 2019-08-30 DIAGNOSIS — R000 Tachycardia, unspecified: Secondary | ICD-10-CM | POA: Diagnosis not present

## 2019-08-30 DIAGNOSIS — F329 Major depressive disorder, single episode, unspecified: Secondary | ICD-10-CM

## 2019-08-30 DIAGNOSIS — I10 Essential (primary) hypertension: Secondary | ICD-10-CM | POA: Diagnosis not present

## 2019-08-30 DIAGNOSIS — R739 Hyperglycemia, unspecified: Secondary | ICD-10-CM

## 2019-08-30 MED ORDER — METOPROLOL SUCCINATE ER 25 MG PO TB24: 25.0000 mg | ORAL_TABLET | Freq: Every day | ORAL | 2 refills | Status: DC

## 2019-08-30 NOTE — Assessment & Plan Note (Signed)
hgba1c acceptable, minimize simple carbs. Increase exercise as tolerated.  

## 2019-08-30 NOTE — Progress Notes (Signed)
Virtual Visit via Video Note  I connected with Lauren Hall on 08/30/19 at  2:20 PM EDT by a video enabled telemedicine application and verified that I am speaking with the correct person using two identifiers.  Location: Patient: home, patient and provider are in visit Provider: office   I discussed the limitations of evaluation and management by telemedicine and the availability of in person appointments. The patient expressed understanding and agreed to proceed. Kem Boroughs, [name] was able to get the patient set up on a video visit.     Subjective:    Patient ID: Lauren Hall, female    DOB: 1970-04-10, 49 y.o.   MRN: [id number]  No chief complaint on file.   HPI Patient is in today for follow up on chronic medical concerns. No recent febrile illness or hospitalizations. She is doing much better, she is seeing a counselor regularly and her medications for her depression are helping. The addition of Abilify has been a good one. She is still processing her grief but it is more manageable. No other new or acute concern. Denies CP/palp/SOB/HA/congestion/fevers/GI or GU c/o. Taking meds as prescribed  Past Medical History:  Diagnosis Date  . Allergy   . Anemia   . Anxiety   . Bronchitis 07/27/2011  . 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 Complication of anesthesia    patient woke up while they were sewing up her incision with breast lumpectomy  . Depression   . Dysrhythmia    Tachycardia  . Enlarged thyroid   . Fatigue 10/08/2010  . Fibromyalgia 2009  . GERD (gastroesophageal reflux disease) 09/26/2013  . Headache(784.0) 10/08/2010   Migraines  . Hip pain, bilateral 11/06/2010  . Hyperglycemia 08/11/2015   normal Hgb A 1C per patient  . Hypertension   . Low back pain radiating to right leg 03/11/2015  . Other and unspecified hyperlipidemia 07/29/2012  . Peripheral neuropathy 11/11/2012   burning sensation mainly right leg  . Poor concentration 01/11/2011  . Preventative  health care 07/29/2012  . Sinusitis, acute 07/27/2011  . Tachycardia   . Thyroid disease 10/08/2010  . Tick bite of flank 11/09/2011  . Tinea corporis 11/11/2012  . Uterine leiomyoma     Past Surgical History:  Procedure Laterality Date  . BREAST LUMPECTOMY Right 1999   benign   . CYSTOSCOPY N/A 10/07/2015   Procedure: CYSTOSCOPY;  Surgeon: Sanjuana Kava, MD;  Location: [facility];  Service: Gynecology;  Laterality: N/A;    Family History  Problem Relation Age of Onset  . Fibromyalgia Mother   . Diabetes Mother        type 2  . Hypertension Mother   . Hyperlipidemia Mother   . Arthritis Mother   . Alcohol abuse Father   . Colon cancer Father   . Hypertension Brother   . Alcohol abuse Brother   . Heart disease Maternal Grandmother   . Hyperlipidemia Maternal Grandmother   . Diabetes Maternal Grandmother        type 2  . Colon cancer Maternal Grandmother   . Stroke Maternal Grandfather   . Diabetes Maternal Grandfather        type 2  . Ovarian cancer Paternal Grandmother   . Alzheimer's disease Paternal Grandfather     Social History   Socioeconomic History  . Marital status: Married    Spouse name: Montine Circle  . Number of children: Not on file  . Years of education: Not on file  . Highest education level: Not on  file  Occupational History  . Not on file  Tobacco Use  . Smoking status: Never Smoker  . Smokeless tobacco: Never Used  Substance and Sexual Activity  . Alcohol use: Yes    Comment: special occassion- very rarely  . Drug use: No  . Sexual activity: Yes    Partners: Male    Birth control/protection: None  Other Topics Concern  . Not on file  Social History Narrative  . Not on file   Social Determinants of Health   Financial Resource Strain:   . Difficulty of Paying Living Expenses:   Food Insecurity:   . Worried About Charity fundraiser in the Last Year:   . Arboriculturist in the Last Year:   Transportation Needs:   . Film/video editor (Medical):    Marland Kitchen Lack of Transportation (Non-Medical):   Physical Activity:   . Days of Exercise per Week:   . Minutes of Exercise per Session:   Stress:   . Feeling of Stress :   Social Connections:   . Frequency of Communication with Friends and Family:   . Frequency of Social Gatherings with Friends and Family:   . Attends Religious Services:   . Active Member of Clubs or Organizations:   . Attends Archivist Meetings:   Marland Kitchen Marital Status:   Intimate Partner Violence:   . Fear of Current or Ex-Partner:   . Emotionally Abused:   Marland Kitchen Physically Abused:   . Sexually Abused:     Outpatient Medications Prior to Visit  Medication Sig Dispense Refill  . ARIPiprazole (ABILIFY) 5 MG tablet Take 1 tablet (5 mg total) by mouth daily. 30 tablet 2  . atorvastatin (LIPITOR) 10 MG tablet Take 0.5 tablets (5 mg total) by mouth daily. 45 tablet 1  . busPIRone (BUSPAR) 5 MG tablet Take 1 tablet (5 mg total) by mouth 3 (three) times daily. 90 tablet 2  . cyclobenzaprine (FLEXERIL) 10 MG tablet TAKE 1/2 TO 1 TABLET(5 TO 10 MG) BY MOUTH THREE TIMES DAILY AS NEEDED FOR MUSCLE SPASMS 90 tablet 2  . diltiazem (CARDIZEM CD) 240 MG 24 hr capsule Take 1 capsule (240 mg total) by mouth daily. 90 capsule 3  . gabapentin (NEURONTIN) 300 MG capsule TAKE 1 CAPSULE BY MOUTH TWICE A DAY AND 2 CAPSULES AT BEDTIME 360 capsule 1  . levothyroxine (SYNTHROID) 25 MCG tablet TAKE 1 TABLET(25 MCG) BY MOUTH DAILY BEFORE BREAKFAST 90 tablet 1  . lisinopril (ZESTRIL) 10 MG tablet TAKE 1 TABLET(10 MG) BY MOUTH TWICE DAILY 180 tablet 1  . LORazepam (ATIVAN) 0.5 MG tablet Take 1 tablet (0.5 mg total) by mouth 2 (two) times daily. 30 tablet 2  . LOW-DOSE ASPIRIN PO     . omeprazole (PRILOSEC) 40 MG capsule Take 1 capsule (40 mg total) by mouth daily. 90 capsule 1  . ondansetron (ZOFRAN) 4 MG tablet Take 1 tablet (4 mg total) by mouth every 8 (eight) hours as needed for nausea or vomiting. 20 tablet 1  . venlafaxine XR (EFFEXOR-XR)  75 MG 24 hr capsule TAKE 1 CAPSULE BY MOUTH EVERYDAY AT BEDTIME 90 capsule 1   No facility-administered medications prior to visit.    Allergies  Allergen Reactions  . Promethazine Anaphylaxis    Review of Systems  Constitutional: Positive for malaise/fatigue. Negative for fever.  HENT: Negative for congestion.   Eyes: Negative for blurred vision.  Respiratory: Negative for shortness of breath.   Cardiovascular: Negative for chest pain,  palpitations and leg swelling.  Gastrointestinal: Negative for abdominal pain, blood in stool and nausea.  Genitourinary: Negative for dysuria and frequency.  Musculoskeletal: Negative for falls.  Skin: Negative for rash.  Neurological: Negative for dizziness, loss of consciousness and headaches.  Endo/Heme/Allergies: Negative for environmental allergies.  Psychiatric/Behavioral: Positive for depression. The patient is nervous/anxious.        Objective:    Physical Exam  LMP 09/02/2015 (Approximate) Comment: continous bleeding Wt Readings from Last 3 Encounters:  06/27/19 163 lb (73.9 kg)  06/08/19 157 lb (71.2 kg)  06/04/19 157 lb (71.2 kg)    Diabetic Foot Exam - Simple   No data filed     Lab Results  Component Value Date   WBC 4.9 05/08/2019   HGB 13.1 05/08/2019   HCT 39.0 05/08/2019   PLT 351.0 05/08/2019   GLUCOSE 85 05/08/2019   CHOL 206 (H) 05/08/2019   TRIG 177.0 (H) 05/08/2019   HDL 67.60 05/08/2019   LDLCALC 103 (H) 05/08/2019   ALT 27 05/08/2019   AST 23 05/08/2019   NA 139 05/08/2019   K 4.4 05/08/2019   CL 101 05/08/2019   CREATININE 0.78 05/08/2019   BUN 5 (L) 05/08/2019   CO2 30 05/08/2019   TSH 0.46 05/08/2019   HGBA1C 5.7 05/08/2019    Lab Results  Component Value Date   TSH 0.46 05/08/2019   Lab Results  Component Value Date   WBC 4.9 05/08/2019   HGB 13.1 05/08/2019   HCT 39.0 05/08/2019   MCV 92.5 05/08/2019   PLT 351.0 05/08/2019   Lab Results  Component Value Date   NA 139 05/08/2019    K 4.4 05/08/2019   CO2 30 05/08/2019   GLUCOSE 85 05/08/2019   BUN 5 (L) 05/08/2019   CREATININE 0.78 05/08/2019   BILITOT 0.3 05/08/2019   ALKPHOS 122 (H) 05/08/2019   AST 23 05/08/2019   ALT 27 05/08/2019   PROT 7.1 05/08/2019   ALBUMIN 4.4 05/08/2019   CALCIUM 9.4 05/08/2019   ANIONGAP 12 01/17/2018   GFR 94.42 05/08/2019   Lab Results  Component Value Date   CHOL 206 (H) 05/08/2019   Lab Results  Component Value Date   HDL 67.60 05/08/2019   Lab Results  Component Value Date   LDLCALC 103 (H) 05/08/2019   Lab Results  Component Value Date   TRIG 177.0 (H) 05/08/2019   Lab Results  Component Value Date   CHOLHDL 3 05/08/2019   Lab Results  Component Value Date   HGBA1C 5.7 05/08/2019       Assessment & Plan:   Problem List Items Addressed This Visit    Depression    She is doing much better on her current medications and she continues to engage in counseling regularly. Her grief is less all consuming.       Hypertension - Primary    Well controlled, no changes to meds. Encouraged heart healthy diet such as the DASH diet and exercise as tolerated.       Relevant Medications   metoprolol succinate (TOPROL-XL) 25 MG 24 hr tablet   Other Relevant Orders   CBC   Hypothyroidism    On Levothyroxine, continue to monitor      Relevant Medications   metoprolol succinate (TOPROL-XL) 25 MG 24 hr tablet   Tachycardia   Relevant Orders   TSH   Hyperlipidemia    Encouraged heart healthy diet, increase exercise, avoid trans fats, consider a krill oil cap daily.  Tolerating Atorvastatin      Relevant Medications   metoprolol succinate (TOPROL-XL) 25 MG 24 hr tablet   Other Relevant Orders   Lipid panel   Hyperglycemia    hgba1c acceptable, minimize simple carbs. Increase exercise as tolerated.       Relevant Orders   Hemoglobin A1c   Comprehensive metabolic panel      I am having Marlin K. Weider start on metoprolol succinate. I am also having  her maintain her ondansetron, omeprazole, atorvastatin, levothyroxine, lisinopril, gabapentin, venlafaxine XR, LOW-DOSE ASPIRIN PO, diltiazem, cyclobenzaprine, ARIPiprazole, busPIRone, and LORazepam.  Meds ordered this encounter  Medications  . metoprolol succinate (TOPROL-XL) 25 MG 24 hr tablet    Sig: Take 1 tablet (25 mg total) by mouth daily.    Dispense:  30 tablet    Refill:  2     Penni Homans, MD   I discussed the assessment and treatment plan with the patient. The patient was provided an opportunity to ask questions and all were answered. The patient agreed with the plan and demonstrated an understanding of the instructions.   The patient was advised to call back or seek an in-person evaluation if the symptoms worsen or if the condition fails to improve as anticipated.  I provided 20 minutes of non-face-to-face time during this encounter.   Penni Homans, MD

## 2019-08-30 NOTE — Assessment & Plan Note (Signed)
Well controlled, no changes to meds. Encouraged heart healthy diet such as the DASH diet and exercise as tolerated.  °

## 2019-08-30 NOTE — Assessment & Plan Note (Signed)
Encouraged heart healthy diet, increase exercise, avoid trans fats, consider a krill oil cap daily. Tolerating Atorvastatin 

## 2019-08-30 NOTE — Assessment & Plan Note (Signed)
On Levothyroxine, continue to monitor 

## 2019-08-30 NOTE — Assessment & Plan Note (Signed)
She is doing much better on her current medications and she continues to engage in counseling regularly. Her grief is less all consuming.

## 2019-08-31 ENCOUNTER — Telehealth: Payer: Self-pay | Admitting: *Deleted

## 2019-08-31 NOTE — Telephone Encounter (Signed)
Needs lab appointment for next week and 3 month follow up htn

## 2019-09-06 ENCOUNTER — Ambulatory Visit (INDEPENDENT_AMBULATORY_CARE_PROVIDER_SITE_OTHER): Payer: 59 | Admitting: Psychology

## 2019-09-06 DIAGNOSIS — F411 Generalized anxiety disorder: Secondary | ICD-10-CM

## 2019-09-11 ENCOUNTER — Ambulatory Visit: Payer: 59 | Admitting: Cardiology

## 2019-09-13 ENCOUNTER — Ambulatory Visit: Payer: 59 | Admitting: Psychology

## 2019-09-14 ENCOUNTER — Ambulatory Visit (INDEPENDENT_AMBULATORY_CARE_PROVIDER_SITE_OTHER): Payer: 59 | Admitting: Psychology

## 2019-09-14 DIAGNOSIS — F411 Generalized anxiety disorder: Secondary | ICD-10-CM | POA: Diagnosis not present

## 2019-09-18 ENCOUNTER — Ambulatory Visit (INDEPENDENT_AMBULATORY_CARE_PROVIDER_SITE_OTHER): Payer: 59 | Admitting: Psychology

## 2019-09-18 DIAGNOSIS — T4145XA Adverse effect of unspecified anesthetic, initial encounter: Secondary | ICD-10-CM | POA: Insufficient documentation

## 2019-09-18 DIAGNOSIS — F411 Generalized anxiety disorder: Secondary | ICD-10-CM

## 2019-09-18 DIAGNOSIS — F419 Anxiety disorder, unspecified: Secondary | ICD-10-CM | POA: Insufficient documentation

## 2019-09-18 DIAGNOSIS — T7840XA Allergy, unspecified, initial encounter: Secondary | ICD-10-CM | POA: Insufficient documentation

## 2019-09-18 DIAGNOSIS — I499 Cardiac arrhythmia, unspecified: Secondary | ICD-10-CM | POA: Insufficient documentation

## 2019-09-18 DIAGNOSIS — E049 Nontoxic goiter, unspecified: Secondary | ICD-10-CM | POA: Insufficient documentation

## 2019-09-18 DIAGNOSIS — D259 Leiomyoma of uterus, unspecified: Secondary | ICD-10-CM | POA: Insufficient documentation

## 2019-09-18 DIAGNOSIS — T8859XA Other complications of anesthesia, initial encounter: Secondary | ICD-10-CM | POA: Insufficient documentation

## 2019-09-19 ENCOUNTER — Ambulatory Visit: Payer: 59 | Admitting: Cardiology

## 2019-10-01 ENCOUNTER — Other Ambulatory Visit: Payer: Self-pay

## 2019-10-01 DIAGNOSIS — F410 Panic disorder [episodic paroxysmal anxiety] without agoraphobia: Secondary | ICD-10-CM

## 2019-10-01 DIAGNOSIS — F411 Generalized anxiety disorder: Secondary | ICD-10-CM

## 2019-10-01 MED ORDER — LORAZEPAM 0.5 MG PO TABS: 0.5000 mg | ORAL_TABLET | Freq: Two times a day (BID) | ORAL | 2 refills | Status: DC

## 2019-10-04 ENCOUNTER — Ambulatory Visit (INDEPENDENT_AMBULATORY_CARE_PROVIDER_SITE_OTHER): Payer: 59 | Admitting: Psychology

## 2019-10-04 DIAGNOSIS — F411 Generalized anxiety disorder: Secondary | ICD-10-CM | POA: Diagnosis not present

## 2019-10-05 NOTE — Telephone Encounter (Signed)
Called Left message for patient to call in for appt

## 2019-10-18 ENCOUNTER — Ambulatory Visit (INDEPENDENT_AMBULATORY_CARE_PROVIDER_SITE_OTHER): Payer: 59 | Admitting: Psychology

## 2019-10-18 DIAGNOSIS — F411 Generalized anxiety disorder: Secondary | ICD-10-CM | POA: Diagnosis not present

## 2019-10-23 ENCOUNTER — Other Ambulatory Visit: Payer: Self-pay | Admitting: Adult Health

## 2019-10-29 ENCOUNTER — Ambulatory Visit (INDEPENDENT_AMBULATORY_CARE_PROVIDER_SITE_OTHER): Payer: 59 | Admitting: Psychology

## 2019-10-29 DIAGNOSIS — F411 Generalized anxiety disorder: Secondary | ICD-10-CM | POA: Diagnosis not present

## 2019-11-01 ENCOUNTER — Other Ambulatory Visit: Payer: Self-pay | Admitting: Family Medicine

## 2019-11-06 ENCOUNTER — Ambulatory Visit: Payer: 59 | Admitting: Cardiology

## 2019-11-09 ENCOUNTER — Other Ambulatory Visit: Payer: Self-pay | Admitting: Family Medicine

## 2019-11-14 ENCOUNTER — Ambulatory Visit (INDEPENDENT_AMBULATORY_CARE_PROVIDER_SITE_OTHER): Payer: 59 | Admitting: Psychology

## 2019-11-14 DIAGNOSIS — F411 Generalized anxiety disorder: Secondary | ICD-10-CM | POA: Diagnosis not present

## 2019-11-16 ENCOUNTER — Other Ambulatory Visit: Payer: Self-pay | Admitting: Family Medicine

## 2019-11-18 ENCOUNTER — Other Ambulatory Visit: Payer: Self-pay | Admitting: Adult Health

## 2019-11-18 DIAGNOSIS — F411 Generalized anxiety disorder: Secondary | ICD-10-CM

## 2019-11-18 DIAGNOSIS — F410 Panic disorder [episodic paroxysmal anxiety] without agoraphobia: Secondary | ICD-10-CM

## 2019-11-29 ENCOUNTER — Ambulatory Visit: Payer: 59 | Admitting: Psychology

## 2019-12-04 ENCOUNTER — Ambulatory Visit: Payer: 59 | Admitting: Cardiology

## 2019-12-06 ENCOUNTER — Ambulatory Visit (INDEPENDENT_AMBULATORY_CARE_PROVIDER_SITE_OTHER): Payer: 59 | Admitting: Psychology

## 2019-12-06 DIAGNOSIS — F411 Generalized anxiety disorder: Secondary | ICD-10-CM

## 2019-12-07 ENCOUNTER — Other Ambulatory Visit: Payer: Self-pay | Admitting: Family Medicine

## 2019-12-21 ENCOUNTER — Ambulatory Visit (INDEPENDENT_AMBULATORY_CARE_PROVIDER_SITE_OTHER): Payer: 59 | Admitting: Psychology

## 2019-12-21 DIAGNOSIS — F411 Generalized anxiety disorder: Secondary | ICD-10-CM

## 2019-12-22 ENCOUNTER — Other Ambulatory Visit: Payer: Self-pay | Admitting: Family Medicine

## 2019-12-23 ENCOUNTER — Other Ambulatory Visit: Payer: Self-pay | Admitting: Family Medicine

## 2020-01-02 ENCOUNTER — Ambulatory Visit: Payer: 59 | Admitting: Psychology

## 2020-01-06 ENCOUNTER — Other Ambulatory Visit: Payer: Self-pay | Admitting: Adult Health

## 2020-01-06 DIAGNOSIS — F410 Panic disorder [episodic paroxysmal anxiety] without agoraphobia: Secondary | ICD-10-CM

## 2020-01-06 DIAGNOSIS — F411 Generalized anxiety disorder: Secondary | ICD-10-CM

## 2020-01-08 ENCOUNTER — Other Ambulatory Visit: Payer: Self-pay | Admitting: Family Medicine

## 2020-01-18 ENCOUNTER — Other Ambulatory Visit: Payer: Self-pay | Admitting: Family Medicine

## 2020-01-25 ENCOUNTER — Other Ambulatory Visit: Payer: Self-pay | Admitting: Adult Health

## 2020-01-25 DIAGNOSIS — F411 Generalized anxiety disorder: Secondary | ICD-10-CM

## 2020-01-25 DIAGNOSIS — F410 Panic disorder [episodic paroxysmal anxiety] without agoraphobia: Secondary | ICD-10-CM

## 2020-02-03 ENCOUNTER — Other Ambulatory Visit: Payer: Self-pay | Admitting: Adult Health

## 2020-02-15 ENCOUNTER — Ambulatory Visit (INDEPENDENT_AMBULATORY_CARE_PROVIDER_SITE_OTHER): Payer: 59 | Admitting: Psychology

## 2020-02-15 DIAGNOSIS — F411 Generalized anxiety disorder: Secondary | ICD-10-CM | POA: Diagnosis not present

## 2020-02-19 ENCOUNTER — Other Ambulatory Visit: Payer: Self-pay | Admitting: Adult Health

## 2020-02-19 DIAGNOSIS — F410 Panic disorder [episodic paroxysmal anxiety] without agoraphobia: Secondary | ICD-10-CM

## 2020-02-19 DIAGNOSIS — F411 Generalized anxiety disorder: Secondary | ICD-10-CM

## 2020-02-20 ENCOUNTER — Ambulatory Visit (INDEPENDENT_AMBULATORY_CARE_PROVIDER_SITE_OTHER): Payer: 59 | Admitting: Psychology

## 2020-02-20 DIAGNOSIS — F411 Generalized anxiety disorder: Secondary | ICD-10-CM

## 2020-02-27 ENCOUNTER — Ambulatory Visit: Payer: 59 | Admitting: Psychology

## 2020-02-28 ENCOUNTER — Ambulatory Visit (INDEPENDENT_AMBULATORY_CARE_PROVIDER_SITE_OTHER): Payer: 59 | Admitting: Adult Health

## 2020-02-28 ENCOUNTER — Encounter: Payer: Self-pay | Admitting: Adult Health

## 2020-02-28 ENCOUNTER — Other Ambulatory Visit: Payer: Self-pay

## 2020-02-28 ENCOUNTER — Other Ambulatory Visit: Payer: Self-pay | Admitting: Family Medicine

## 2020-02-28 DIAGNOSIS — F331 Major depressive disorder, recurrent, moderate: Secondary | ICD-10-CM

## 2020-02-28 DIAGNOSIS — F411 Generalized anxiety disorder: Secondary | ICD-10-CM | POA: Diagnosis not present

## 2020-02-28 DIAGNOSIS — F410 Panic disorder [episodic paroxysmal anxiety] without agoraphobia: Secondary | ICD-10-CM | POA: Diagnosis not present

## 2020-02-28 DIAGNOSIS — G4700 Insomnia, unspecified: Secondary | ICD-10-CM

## 2020-02-28 MED ORDER — VENLAFAXINE HCL ER 75 MG PO CP24: 75.0000 mg | ORAL_CAPSULE | Freq: Every day | ORAL | 5 refills | Status: DC

## 2020-02-28 MED ORDER — LORAZEPAM 1 MG PO TABS: 1.0000 mg | ORAL_TABLET | Freq: Two times a day (BID) | ORAL | 2 refills | Status: DC

## 2020-02-28 MED ORDER — BUSPIRONE HCL 5 MG PO TABS: ORAL_TABLET | ORAL | 5 refills | Status: DC

## 2020-02-28 MED ORDER — ARIPIPRAZOLE 5 MG PO TABS: 5.0000 mg | ORAL_TABLET | Freq: Every day | ORAL | 5 refills | Status: DC

## 2020-02-28 NOTE — Progress Notes (Signed)
Lauren Hall [id number] 27-Nov-1970 50 y.o.  Subjective:   Patient ID:  Lauren Hall is a 50 y.o. (DOB 07-12-1970) female.  Chief Complaint: No chief complaint on file.   HPI Lauren Hall presents to the office today for follow-up of MDD, GAD, insomnia, and panic attacks.  Describes mood today as "ok". Pleasant.Tearful at times. Mood symptoms - reports increased depression, anxiety, and irritability. Has not been taking the Abilify - plans to restart. Continues to grieve loss of mother. Worried about her daughter. Husband in dialysis. Decreased interest and motivation. Taking medications as prescribed.  Energy levels decreased. Active, does not have a regular exercise routine.  Enjoys some usual interests and activities. Married. Lives with husband of 20 years. Has one daughter - age 67.  Appetite adequate. Weight gain.  Sleeps well most nights. Averages 8 hours. Focus and concentration mostly stable. Completing tasks. Managing some aspects of household. 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 Denies SI or HI.  Denies AH or VH.  Previous medication trials: Pristiq, Wellbutrin, Prozac, Cymbalta, Lexapro, Zoloft,      Review of Systems:  Review of Systems  Musculoskeletal: Negative for gait problem.  Neurological: Negative for tremors.  Psychiatric/Behavioral:       Please refer to HPI    Medications: I have reviewed the patient's current medications.  Current Outpatient Medications  Medication Sig Dispense Refill  . LORazepam (ATIVAN) 1 MG tablet Take 1 tablet (1 mg total) by mouth 2 (two) times daily. 60 tablet 2  . ARIPiprazole (ABILIFY) 5 MG tablet Take 1 tablet (5 mg total) by mouth daily. 30 tablet 5  . atorvastatin (LIPITOR) 10 MG tablet Take 0.5 tablets (5 mg total) by mouth daily. 45 tablet 1  . busPIRone (BUSPAR) 5 MG tablet TAKE 1 TABLET(5 MG) BY MOUTH THREE TIMES DAILY 90 tablet 5  . cyclobenzaprine (FLEXERIL) 10 MG tablet TAKE 1/2 TO 1 TABLET(5 TO 10 MG) BY  MOUTH THREE TIMES DAILY AS NEEDED FOR MUSCLE SPASMS 90 tablet 0  . diltiazem (CARDIZEM CD) 240 MG 24 hr capsule Take 1 capsule (240 mg total) by mouth daily. 90 capsule 3  . gabapentin (NEURONTIN) 300 MG capsule TAKE 1 CAPSULE BY MOUTH TWICE A DAY AND 2 CAPSULES AT BEDTIME 360 capsule 1  . levothyroxine (SYNTHROID) 25 MCG tablet Take 1 tablet (25 mcg total) by mouth daily before breakfast. 90 tablet 1  . lisinopril (ZESTRIL) 10 MG tablet TAKE 1 TABLET(10 MG) BY MOUTH TWICE DAILY 180 tablet 1  . LOW-DOSE ASPIRIN PO     . metoprolol succinate (TOPROL-XL) 25 MG 24 hr tablet Take 1 tablet (25 mg total) by mouth daily. 90 tablet 0  . omeprazole (PRILOSEC) 40 MG capsule Take 1 capsule (40 mg total) by mouth daily. 90 capsule 0  . ondansetron (ZOFRAN) 4 MG tablet Take 1 tablet (4 mg total) by mouth every 8 (eight) hours as needed for nausea or vomiting. 20 tablet 1  . venlafaxine XR (EFFEXOR-XR) 75 MG 24 hr capsule Take 1 capsule (75 mg total) by mouth at bedtime. 30 capsule 5   No current facility-administered medications for this visit.    Medication Side Effects: None  Allergies:  Allergies  Allergen Reactions  . Promethazine Anaphylaxis    Past Medical History:  Diagnosis Date  . Allergy   . Anemia   . Anxiety   . 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 Bronchitis 07/27/2011  . 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 Complication of anesthesia  patient woke up while they were sewing up her incision with breast lumpectomy  . Depression   . Dysrhythmia    Tachycardia  . Enlarged thyroid   . Fatigue 10/08/2010  . Fibromyalgia 2009  . GERD (gastroesophageal reflux disease) 09/26/2013  . Headache(784.0) 10/08/2010   Migraines  . Hip pain, bilateral 11/06/2010  . Hyperglycemia 08/11/2015   normal Hgb A 1C per patient  . Hypertension   . Hypothyroidism 10/08/2010  . Low back pain radiating to right leg 03/11/2015  . Other and unspecified hyperlipidemia 07/29/2012  . Peripheral neuropathy  11/11/2012   burning sensation mainly right leg  . Poor concentration 01/11/2011  . Preventative health care 07/29/2012  . S/P laparoscopic hysterectomy 10/07/2015  . Sinusitis, acute 07/27/2011  . Tachycardia   . Thyroid disease 10/08/2010  . Tick bite of flank 11/09/2011  . Tinea corporis 11/11/2012  . Uterine leiomyoma     Family History  Problem Relation Age of Onset  . Fibromyalgia Mother   . Diabetes Mother        type 2  . Hypertension Mother   . Hyperlipidemia Mother   . Arthritis Mother   . Alcohol abuse Father   . Colon cancer Father   . Hypertension Brother   . Alcohol abuse Brother   . Heart disease Maternal Grandmother   . Hyperlipidemia Maternal Grandmother   . Diabetes Maternal Grandmother        type 2  . Colon cancer Maternal Grandmother   . Stroke Maternal Grandfather   . Diabetes Maternal Grandfather        type 2  . Ovarian cancer Paternal Grandmother   . Alzheimer's disease Paternal Grandfather     Social History   Socioeconomic History  . Marital status: Married    Spouse name: Montine Circle  . Number of children: Not on file  . Years of education: Not on file  . Highest education level: Not on file  Occupational History  . Not on file  Tobacco Use  . Smoking status: Never Smoker  . Smokeless tobacco: Never Used  Substance and Sexual Activity  . Alcohol use: Yes    Comment: special occassion- very rarely  . Drug use: No  . Sexual activity: Yes    Partners: Male    Birth control/protection: None  Other Topics Concern  . Not on file  Social History Narrative  . Not on file   Social Determinants of Health   Financial Resource Strain: Not on file  Food Insecurity: Not on file  Transportation Needs: Not on file  Physical Activity: Not on file  Stress: Not on file  Social Connections: Not on file  Intimate Partner Violence: Not on file    Past Medical History, Surgical history, Social history, and Family history were reviewed and updated as  appropriate.   Please see review of systems for further details on the patient's review from today.   Objective:   Physical Exam:  LMP 09/02/2015 (Approximate) Comment: continous bleeding  Physical Exam Constitutional:      General: She is not in acute distress. Musculoskeletal:        General: No deformity.  Neurological:     Mental Status: She is alert and oriented to person, place, and time.     Coordination: Coordination normal.  Psychiatric:        Attention and Perception: Attention and perception normal. She does not perceive auditory or visual hallucinations.        Mood and Affect:  Mood normal. Mood is not anxious or depressed. Affect is not labile, blunt, angry or inappropriate.        Speech: Speech normal.        Behavior: Behavior normal.        Thought Content: Thought content normal. Thought content is not paranoid or delusional. Thought content does not include homicidal or suicidal ideation. Thought content does not include homicidal or suicidal plan.        Cognition and Memory: Cognition and memory normal.        Judgment: Judgment normal.     Comments: Insight intact     Lab Review:     Component Value Date/Time   NA 139 05/08/2019 1517   K 4.4 05/08/2019 1517   CL 101 05/08/2019 1517   CO2 30 05/08/2019 1517   GLUCOSE 85 05/08/2019 1517   BUN 5 (L) 05/08/2019 1517   CREATININE 0.78 05/08/2019 1517   CREATININE 0.79 09/24/2013 1544   CALCIUM 9.4 05/08/2019 1517   PROT 7.1 05/08/2019 1517   ALBUMIN 4.4 05/08/2019 1517   AST 23 05/08/2019 1517   ALT 27 05/08/2019 1517   ALKPHOS 122 (H) 05/08/2019 1517   BILITOT 0.3 05/08/2019 1517   GFRNONAA >60 01/17/2018 0128   GFRAA >60 01/17/2018 0128       Component Value Date/Time   WBC 4.9 05/08/2019 1517   RBC 4.21 05/08/2019 1517   HGB 13.1 05/08/2019 1517   HCT 39.0 05/08/2019 1517   PLT 351.0 05/08/2019 1517   MCV 92.5 05/08/2019 1517   MCH 30.4 01/17/2018 0128   MCHC 33.7 05/08/2019 1517   RDW  13.8 05/08/2019 1517   LYMPHSABS 2.7 03/07/2012 1556   MONOABS 0.6 03/07/2012 1556   EOSABS 0.1 03/07/2012 1556   BASOSABS 0.0 03/07/2012 1556    No results found for: POCLITH, LITHIUM   No results found for: PHENYTOIN, PHENOBARB, VALPROATE, CBMZ   .res Assessment: Plan:    Plan:  PDMP reviewed  1. Buspar 5mg  TID   2. Effexor XR 75mg  daily   3. Abilify 5 mg daily  4. Ativan 0.5mg  twice daily  Therapist - Marya Fossa  RTC 4 weeks  Patient advised to contact office with any questions, adverse effects, or acute worsening in signs and symptoms.  Discussed potential benefits, risk, and side effects of benzodiazepines to include potential risk of tolerance and dependence, as well as possible drowsiness.  Advised patient not to drive if experiencing drowsiness and to take lowest possible effective dose to minimize risk of dependence and tolerance.  Discussed potential metabolic side effects associated with atypical antipsychotics, as well as potential risk for movement side effects. Advised pt to contact office if movement side effects occur.    Diagnoses and all orders for this visit:  Insomnia, unspecified type  Generalized anxiety disorder -     LORazepam (ATIVAN) 1 MG tablet; Take 1 tablet (1 mg total) by mouth 2 (two) times daily. -     busPIRone (BUSPAR) 5 MG tablet; TAKE 1 TABLET(5 MG) BY MOUTH THREE TIMES DAILY -     ARIPiprazole (ABILIFY) 5 MG tablet; Take 1 tablet (5 mg total) by mouth daily. -     venlafaxine XR (EFFEXOR-XR) 75 MG 24 hr capsule; Take 1 capsule (75 mg total) by mouth at bedtime.  Panic attacks -     LORazepam (ATIVAN) 1 MG tablet; Take 1 tablet (1 mg total) by mouth 2 (two) times daily.  Major depressive disorder, recurrent episode, moderate (  [place]) -     ARIPiprazole (ABILIFY) 5 MG tablet; Take 1 tablet (5 mg total) by mouth daily.     Please see After Visit Summary for patient specific instructions.  No future appointments.  No orders of the  defined types were placed in this encounter.   -------------------------------

## 2020-04-24 ENCOUNTER — Other Ambulatory Visit: Payer: Self-pay | Admitting: Family Medicine

## 2020-05-31 ENCOUNTER — Other Ambulatory Visit: Payer: Self-pay | Admitting: Family Medicine

## 2020-06-09 ENCOUNTER — Telehealth: Payer: Self-pay | Admitting: Adult Health

## 2020-06-09 ENCOUNTER — Other Ambulatory Visit: Payer: Self-pay

## 2020-06-09 DIAGNOSIS — F411 Generalized anxiety disorder: Secondary | ICD-10-CM

## 2020-06-09 DIAGNOSIS — F410 Panic disorder [episodic paroxysmal anxiety] without agoraphobia: Secondary | ICD-10-CM

## 2020-06-09 MED ORDER — LORAZEPAM 1 MG PO TABS: 1.0000 mg | ORAL_TABLET | Freq: Two times a day (BID) | ORAL | 0 refills | Status: DC

## 2020-06-09 NOTE — Telephone Encounter (Signed)
pended

## 2020-06-09 NOTE — Telephone Encounter (Signed)
Script sent  

## 2020-06-09 NOTE — Telephone Encounter (Signed)
Gao called in with refill for Lorazepam 1mg . Appt 7/28. Ph: [phone]. Pharmacy [facility] [place]

## 2020-06-17 ENCOUNTER — Other Ambulatory Visit: Payer: Self-pay | Admitting: Family Medicine

## 2020-06-24 ENCOUNTER — Ambulatory Visit (INDEPENDENT_AMBULATORY_CARE_PROVIDER_SITE_OTHER): Payer: 59 | Admitting: Psychology

## 2020-06-24 DIAGNOSIS — F411 Generalized anxiety disorder: Secondary | ICD-10-CM | POA: Diagnosis not present

## 2020-06-26 ENCOUNTER — Other Ambulatory Visit: Payer: Self-pay | Admitting: Family Medicine

## 2020-06-26 DIAGNOSIS — F411 Generalized anxiety disorder: Secondary | ICD-10-CM

## 2020-07-02 ENCOUNTER — Ambulatory Visit (INDEPENDENT_AMBULATORY_CARE_PROVIDER_SITE_OTHER): Payer: 59 | Admitting: Psychology

## 2020-07-02 DIAGNOSIS — F411 Generalized anxiety disorder: Secondary | ICD-10-CM | POA: Diagnosis not present

## 2020-07-10 ENCOUNTER — Ambulatory Visit (INDEPENDENT_AMBULATORY_CARE_PROVIDER_SITE_OTHER): Payer: 59 | Admitting: Adult Health

## 2020-07-10 ENCOUNTER — Other Ambulatory Visit: Payer: Self-pay

## 2020-07-10 ENCOUNTER — Encounter: Payer: Self-pay | Admitting: Adult Health

## 2020-07-10 DIAGNOSIS — F411 Generalized anxiety disorder: Secondary | ICD-10-CM

## 2020-07-10 DIAGNOSIS — G4700 Insomnia, unspecified: Secondary | ICD-10-CM

## 2020-07-10 DIAGNOSIS — F331 Major depressive disorder, recurrent, moderate: Secondary | ICD-10-CM | POA: Diagnosis not present

## 2020-07-10 DIAGNOSIS — F410 Panic disorder [episodic paroxysmal anxiety] without agoraphobia: Secondary | ICD-10-CM | POA: Diagnosis not present

## 2020-07-10 MED ORDER — LORAZEPAM 1 MG PO TABS: 1.0000 mg | ORAL_TABLET | Freq: Two times a day (BID) | ORAL | 2 refills | Status: DC

## 2020-07-10 MED ORDER — BUSPIRONE HCL 5 MG PO TABS: ORAL_TABLET | ORAL | 5 refills | Status: DC

## 2020-07-10 MED ORDER — ARIPIPRAZOLE 5 MG PO TABS: 5.0000 mg | ORAL_TABLET | Freq: Every day | ORAL | 5 refills | Status: DC

## 2020-07-10 NOTE — Progress Notes (Signed)
Lauren Hall [id number] 18-May-1970 50 y.o.  Subjective:   Patient ID:  Lauren Hall is a 50 y.o. (DOB Mar 04, 1970) female.  Chief Complaint: No chief complaint on file.   HPI Lauren Hall presents to the office today for follow-up of MDD, GAD, insomnia, and panic attacks.  Describes mood today as "not the best". Pleasant.Tearful at times. Mood symptoms - reports depression, anxiety, and irritability. Reports increased situational stressors since last visit. Stepfather recently passed away. Was unable to see him in the hospital due to family conflict. Conflict with 2 step sisters has continued. Continues to grieve loss of mother. Husband in dialysis. Stating "I've been dealing with a lot of things". Ran out of Abilify and has been unable to get a refill. Plans to restart medication. Varying interest and motivation. Taking medications as prescribed.  Energy levels decreased. Active, does not have a regular exercise routine.  Enjoys some usual interests and activities. Married. Lives with husband of 20 years. Has one daughter - age 38.  Appetite adequate. Weight stable.  Sleeps well most nights. Averages 8 hours. Focus and concentration mostly stable. Completing tasks. Managing some aspects of household. 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 Denies SI or HI.  Denies AH or VH.  Previous medication trials: Pristiq, Wellbutrin, Prozac, Cymbalta, Lexapro, Zoloft,    Review of Systems:  Review of Systems  Musculoskeletal:  Negative for gait problem.  Neurological:  Negative for tremors.  Psychiatric/Behavioral:         Please refer to HPI   Medications: I have reviewed the patient's current medications.  Current Outpatient Medications  Medication Sig Dispense Refill   ARIPiprazole (ABILIFY) 5 MG tablet Take 1 tablet (5 mg total) by mouth daily. 30 tablet 5   atorvastatin (LIPITOR) 10 MG tablet Take 0.5 tablets (5 mg total) by mouth daily. 45 tablet 1   busPIRone (BUSPAR) 5 MG  tablet TAKE 1 TABLET(5 MG) BY MOUTH THREE TIMES DAILY 90 tablet 5   cyclobenzaprine (FLEXERIL) 10 MG tablet TAKE 1/2 TO 1 TABLET(5 TO 10 MG) BY MOUTH THREE TIMES DAILY AS NEEDED FOR MUSCLE SPASMS 90 tablet 0   diltiazem (CARDIZEM CD) 240 MG 24 hr capsule Take 1 capsule (240 mg total) by mouth daily. 90 capsule 3   gabapentin (NEURONTIN) 300 MG capsule TAKE 1 CAPSULE BY MOUTH TWICE A DAY AND 2 CAPSULES AT BEDTIME 360 capsule 1   levothyroxine (SYNTHROID) 25 MCG tablet Take 1 tablet (25 mcg total) by mouth daily before breakfast. 90 tablet 1   lisinopril (ZESTRIL) 10 MG tablet Take 1 tablet (10 mg total) by mouth in the morning and at bedtime. 60 tablet 0   LORazepam (ATIVAN) 1 MG tablet Take 1 tablet (1 mg total) by mouth 2 (two) times daily. 60 tablet 2   LOW-DOSE ASPIRIN PO      metoprolol succinate (TOPROL-XL) 25 MG 24 hr tablet Take 1 tablet (25 mg total) by mouth daily. 90 tablet 0   omeprazole (PRILOSEC) 40 MG capsule Take 1 capsule (40 mg total) by mouth daily. 90 capsule 0   ondansetron (ZOFRAN) 4 MG tablet Take 1 tablet (4 mg total) by mouth every 8 (eight) hours as needed for nausea or vomiting. 20 tablet 1   venlafaxine XR (EFFEXOR-XR) 75 MG 24 hr capsule TAKE 1 CAPSULE(75 MG) BY MOUTH AT BEDTIME 90 capsule 0   No current facility-administered medications for this visit.    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   Bronchitis 07/27/2011   Chiari malformation 4/0/7680   Complication of anesthesia    patient woke up while they were sewing up her incision with breast lumpectomy   Depression    Dysrhythmia    Tachycardia   Enlarged thyroid    Fatigue 10/08/2010   Fibromyalgia 2009   GERD (gastroesophageal reflux disease) 09/26/2013   Headache(784.0) 10/08/2010   Migraines   Hip pain, bilateral 11/06/2010    Hyperglycemia 08/11/2015   normal Hgb A 1C per patient   Hypertension    Hypothyroidism 10/08/2010   Low back pain radiating to right leg 03/11/2015   Other and unspecified hyperlipidemia 07/29/2012   Peripheral neuropathy 11/11/2012   burning sensation mainly right leg   Poor concentration 01/11/2011   Preventative health care 07/29/2012   S/P laparoscopic hysterectomy 10/07/2015   Sinusitis, acute 07/27/2011   Tachycardia    Thyroid disease 10/08/2010   Tick bite of flank 11/09/2011   Tinea corporis 11/11/2012   Uterine leiomyoma     Past Medical History, Surgical history, Social history, and Family history were reviewed and updated as appropriate.   Please see review of systems for further details on the patient's review from today.   Objective:   Physical Exam:  LMP 09/02/2015 (Approximate) Comment: continous bleeding  Physical Exam Constitutional:      General: She is not in acute distress. Musculoskeletal:        General: No deformity.  Neurological:     Mental Status: She is alert and oriented to person, place, and time.     Coordination: Coordination normal.  Psychiatric:        Attention and Perception: Attention and perception normal. She does not perceive auditory or visual hallucinations.        Mood and Affect: Mood normal. Mood is not anxious or depressed. Affect is not labile, blunt, angry or inappropriate.        Speech: Speech normal.        Behavior: Behavior normal.        Thought Content: Thought content normal. Thought content is not paranoid or delusional. Thought content does not include homicidal or suicidal ideation. Thought content does not include homicidal or suicidal plan.        Cognition and Memory: Cognition and memory normal.        Judgment: Judgment normal.     Comments: Insight intact    Lab Review:     Component Value Date/Time   NA 139 05/08/2019 1517   K 4.4 05/08/2019 1517   CL 101 05/08/2019 1517   CO2 30 05/08/2019 1517   GLUCOSE  85 05/08/2019 1517   BUN 5 (L) 05/08/2019 1517   CREATININE 0.78 05/08/2019 1517   CREATININE 0.79 09/24/2013 1544   CALCIUM 9.4 05/08/2019 1517   PROT 7.1 05/08/2019 1517   ALBUMIN 4.4 05/08/2019 1517   AST 23 05/08/2019 1517   ALT 27 05/08/2019 1517   ALKPHOS 122 (H) 05/08/2019 1517   BILITOT 0.3 05/08/2019 1517   GFRNONAA >60 01/17/2018 0128   GFRAA >60 01/17/2018 0128       Component Value Date/Time   WBC 4.9 05/08/2019 1517   RBC 4.21 05/08/2019 1517   HGB 13.1 05/08/2019 1517   HCT 39.0 05/08/2019 1517   PLT 351.0 05/08/2019 1517   MCV 92.5  05/08/2019 1517   MCH 30.4 01/17/2018 0128   MCHC 33.7 05/08/2019 1517   RDW 13.8 05/08/2019 1517   LYMPHSABS 2.7 03/07/2012 1556   MONOABS 0.6 03/07/2012 1556   EOSABS 0.1 03/07/2012 1556   BASOSABS 0.0 03/07/2012 1556    No results found for: POCLITH, LITHIUM   No results found for: PHENYTOIN, PHENOBARB, VALPROATE, CBMZ   .res Assessment: Plan:    Plan:  PDMP reviewed  1. Buspar 5mg  TID   2. Effexor XR 75mg  daily   3. Restart Abilify 5 mg daily  4. Ativan 0.5mg  twice daily  Therapist - Marya Fossa  RTC 4 weeks  Patient advised to contact office with any questions, adverse effects, or acute worsening in signs and symptoms.  Discussed potential benefits, risk, and side effects of benzodiazepines to include potential risk of tolerance and dependence, as well as possible drowsiness.  Advised patient not to drive if experiencing drowsiness and to take lowest possible effective dose to minimize risk of dependence and tolerance.  Discussed potential metabolic side effects associated with atypical antipsychotics, as well as potential risk for movement side effects. Advised pt to contact office if movement side effects occur.   Diagnoses and all orders for this visit:  Insomnia, unspecified type  Generalized anxiety disorder -     ARIPiprazole (ABILIFY) 5 MG tablet; Take 1 tablet (5 mg total) by mouth daily. -      busPIRone (BUSPAR) 5 MG tablet; TAKE 1 TABLET(5 MG) BY MOUTH THREE TIMES DAILY -     LORazepam (ATIVAN) 1 MG tablet; Take 1 tablet (1 mg total) by mouth 2 (two) times daily.  Major depressive disorder, recurrent episode, moderate ([facility]) -     ARIPiprazole (ABILIFY) 5 MG tablet; Take 1 tablet (5 mg total) by mouth daily.  Panic attacks -     LORazepam (ATIVAN) 1 MG tablet; Take 1 tablet (1 mg total) by mouth 2 (two) times daily.    Please see After Visit Summary for patient specific instructions.  Future Appointments  Date Time Provider [place]  07/11/2020  9:00 AM Gerri Lins [facility] None  08/07/2020  3:00 PM Jonalyn Sedlak, Berdie Ogren, NP [facility] None    No orders of the defined types were placed in this encounter.   -------------------------------

## 2020-07-11 ENCOUNTER — Ambulatory Visit: Payer: 59 | Admitting: Psychology

## 2020-07-30 ENCOUNTER — Ambulatory Visit (INDEPENDENT_AMBULATORY_CARE_PROVIDER_SITE_OTHER): Payer: 59 | Admitting: Psychology

## 2020-07-30 DIAGNOSIS — F411 Generalized anxiety disorder: Secondary | ICD-10-CM | POA: Diagnosis not present

## 2020-08-07 ENCOUNTER — Ambulatory Visit (INDEPENDENT_AMBULATORY_CARE_PROVIDER_SITE_OTHER): Payer: 59 | Admitting: Adult Health

## 2020-08-07 ENCOUNTER — Encounter: Payer: Self-pay | Admitting: Adult Health

## 2020-08-07 ENCOUNTER — Other Ambulatory Visit: Payer: Self-pay

## 2020-08-07 DIAGNOSIS — F410 Panic disorder [episodic paroxysmal anxiety] without agoraphobia: Secondary | ICD-10-CM | POA: Diagnosis not present

## 2020-08-07 DIAGNOSIS — F331 Major depressive disorder, recurrent, moderate: Secondary | ICD-10-CM | POA: Diagnosis not present

## 2020-08-07 DIAGNOSIS — F411 Generalized anxiety disorder: Secondary | ICD-10-CM

## 2020-08-07 DIAGNOSIS — G4700 Insomnia, unspecified: Secondary | ICD-10-CM

## 2020-08-07 MED ORDER — BUSPIRONE HCL 5 MG PO TABS: ORAL_TABLET | ORAL | 5 refills | Status: DC

## 2020-08-07 MED ORDER — CLONAZEPAM 0.5 MG PO TABS
0.5000 mg | ORAL_TABLET | Freq: Two times a day (BID) | ORAL | 2 refills | Status: DC | PRN
Start: 2020-08-07 — End: 2020-09-04

## 2020-08-07 NOTE — Progress Notes (Signed)
Lauren Hall [id number] 12-05-1970 50 y.o.  Subjective:   Patient ID:  Lauren Hall is a 50 y.o. (DOB 03/29/1970) female.  Chief Complaint: No chief complaint on file.   HPI Lauren Hall presents to the office today for follow-up of MDD, GAD, insomnia, and panic attacks.  Describes mood today as "ok". Pleasant.Tearful at times. Mood symptoms - reports depression and anxiety. More anxious overall. Denies irritability. Ongoing situational stressors. Conflict with step sisters over father's estate. Working on grief/loss issues. Husband in dialysis - depressed. Stating "I worry about him so much". Daughter having mental health issues.Stating "I'm still really stressed". Varying interest and motivation. Taking medications as prescribed.  Energy levels decreased. Active, does not have a regular exercise routine.  Enjoys some usual interests and activities. Married. Lives with husband of 20 years. Has one daughter - age 58.  Appetite adequate. Weight stable.  Sleeps well most nights. Averages 6 to 7 hours - 5 hours last night. Focus and concentration mostly stable. Stating "it takes a lot when I'm driving". Completing tasks. Managing some aspects of household. 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 Denies SI or HI.  Denies AH or VH.  Previous medication trials: Pristiq, Wellbutrin, Prozac, Cymbalta, Lexapro, Zoloft,   Review of Systems:  Review of Systems  Musculoskeletal:  Negative for gait problem.  Neurological:  Negative for tremors.  Psychiatric/Behavioral:         Please refer to HPI   Medications: I have reviewed the patient's current medications.  Current Outpatient Medications  Medication Sig Dispense Refill   clonazePAM (KLONOPIN) 0.5 MG tablet Take 1 tablet (0.5 mg total) by mouth 2 (two) times daily as needed for anxiety. 60 tablet 2   ARIPiprazole (ABILIFY) 5 MG tablet Take 1 tablet (5 mg total) by mouth daily. 30 tablet 5   atorvastatin (LIPITOR) 10 MG tablet Take 0.5  tablets (5 mg total) by mouth daily. 45 tablet 1   busPIRone (BUSPAR) 5 MG tablet TAKE 1 TABLET(5 MG) BY MOUTH THREE TIMES DAILY 90 tablet 5   cyclobenzaprine (FLEXERIL) 10 MG tablet TAKE 1/2 TO 1 TABLET(5 TO 10 MG) BY MOUTH THREE TIMES DAILY AS NEEDED FOR MUSCLE SPASMS 90 tablet 0   diltiazem (CARDIZEM CD) 240 MG 24 hr capsule Take 1 capsule (240 mg total) by mouth daily. 90 capsule 3   gabapentin (NEURONTIN) 300 MG capsule TAKE 1 CAPSULE BY MOUTH TWICE A DAY AND 2 CAPSULES AT BEDTIME 360 capsule 1   levothyroxine (SYNTHROID) 25 MCG tablet Take 1 tablet (25 mcg total) by mouth daily before breakfast. 90 tablet 1   lisinopril (ZESTRIL) 10 MG tablet Take 1 tablet (10 mg total) by mouth in the morning and at bedtime. 60 tablet 0   LORazepam (ATIVAN) 1 MG tablet Take 1 tablet (1 mg total) by mouth 2 (two) times daily. 60 tablet 2   LOW-DOSE ASPIRIN PO      metoprolol succinate (TOPROL-XL) 25 MG 24 hr tablet Take 1 tablet (25 mg total) by mouth daily. 90 tablet 0   omeprazole (PRILOSEC) 40 MG capsule Take 1 capsule (40 mg total) by mouth daily. 90 capsule 0   ondansetron (ZOFRAN) 4 MG tablet Take 1 tablet (4 mg total) by mouth every 8 (eight) hours as needed for nausea or vomiting. 20 tablet 1   venlafaxine XR (EFFEXOR-XR) 75 MG 24 hr capsule TAKE 1 CAPSULE(75 MG) BY MOUTH AT BEDTIME 90 capsule 0   No current facility-administered medications for this visit.  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   Bronchitis 07/27/2011   Chiari malformation 10/08/2227   Complication of anesthesia    patient woke up while they were sewing up her incision with breast lumpectomy   Depression    Dysrhythmia    Tachycardia   Enlarged thyroid    Fatigue 10/08/2010   Fibromyalgia 2009   GERD (gastroesophageal reflux disease)  09/26/2013   Headache(784.0) 10/08/2010   Migraines   Hip pain, bilateral 11/06/2010   Hyperglycemia 08/11/2015   normal Hgb A 1C per patient   Hypertension    Hypothyroidism 10/08/2010   Low back pain radiating to right leg 03/11/2015   Other and unspecified hyperlipidemia 07/29/2012   Peripheral neuropathy 11/11/2012   burning sensation mainly right leg   Poor concentration 01/11/2011   Preventative health care 07/29/2012   S/P laparoscopic hysterectomy 10/07/2015   Sinusitis, acute 07/27/2011   Tachycardia    Thyroid disease 10/08/2010   Tick bite of flank 11/09/2011   Tinea corporis 11/11/2012   Uterine leiomyoma     Past Medical History, Surgical history, Social history, and Family history were reviewed and updated as appropriate.   Please see review of systems for further details on the patient's review from today.   Objective:   Physical Exam:  LMP 09/02/2015 (Approximate) Comment: continous bleeding  Physical Exam Constitutional:      General: She is not in acute distress. Musculoskeletal:        General: No deformity.  Neurological:     Mental Status: She is alert and oriented to person, place, and time.     Coordination: Coordination normal.  Psychiatric:        Attention and Perception: Attention and perception normal. She does not perceive auditory or visual hallucinations.        Mood and Affect: Mood normal. Mood is not anxious or depressed. Affect is not labile, blunt, angry or inappropriate.        Speech: Speech normal.        Behavior: Behavior normal.        Thought Content: Thought content normal. Thought content is not paranoid or delusional. Thought content does not include homicidal or suicidal ideation. Thought content does not include homicidal or suicidal plan.        Cognition and Memory: Cognition and memory normal.        Judgment: Judgment normal.     Comments: Insight intact    Lab Review:     Component Value Date/Time   NA 139 05/08/2019 1517   K  4.4 05/08/2019 1517   CL 101 05/08/2019 1517   CO2 30 05/08/2019 1517   GLUCOSE 85 05/08/2019 1517   BUN 5 (L) 05/08/2019 1517   CREATININE 0.78 05/08/2019 1517   CREATININE 0.79 09/24/2013 1544   CALCIUM 9.4 05/08/2019 1517   PROT 7.1 05/08/2019 1517   ALBUMIN 4.4 05/08/2019 1517   AST 23 05/08/2019 1517   ALT 27 05/08/2019 1517   ALKPHOS 122 (H) 05/08/2019 1517   BILITOT 0.3 05/08/2019 1517   GFRNONAA >60 01/17/2018 0128   GFRAA >60 01/17/2018 0128       Component Value Date/Time   WBC 4.9 05/08/2019 1517   RBC 4.21 05/08/2019 1517   HGB 13.1 05/08/2019 1517   HCT  39.0 05/08/2019 1517   PLT 351.0 05/08/2019 1517   MCV 92.5 05/08/2019 1517   MCH 30.4 01/17/2018 0128   MCHC 33.7 05/08/2019 1517   RDW 13.8 05/08/2019 1517   LYMPHSABS 2.7 03/07/2012 1556   MONOABS 0.6 03/07/2012 1556   EOSABS 0.1 03/07/2012 1556   BASOSABS 0.0 03/07/2012 1556    No results found for: POCLITH, LITHIUM   No results found for: PHENYTOIN, PHENOBARB, VALPROATE, CBMZ   .res Assessment: Plan:     Plan:  PDMP reviewed  1. Increased Buspar 5mg  TID to 10mg   2. Effexor XR 75mg  daily   3. Abilify 5 mg daily  4. Discontinue Ativan 1mg  twice daily 5. Add Clonazepam 0.5mg  BID   Therapist - Marya Fossa  RTC 4 weeks  Patient advised to contact office with any questions, adverse effects, or acute worsening in signs and symptoms.  Discussed potential benefits, risk, and side effects of benzodiazepines to include potential risk of tolerance and dependence, as well as possible drowsiness.  Advised patient not to drive if experiencing drowsiness and to take lowest possible effective dose to minimize risk of dependence and tolerance.  Discussed potential metabolic side effects associated with atypical antipsychotics, as well as potential risk for movement side effects. Advised pt to contact office if movement side effects occur.   Diagnoses and all orders for this visit:  Insomnia,  unspecified type  Generalized anxiety disorder -     busPIRone (BUSPAR) 5 MG tablet; TAKE 1 TABLET(5 MG) BY MOUTH THREE TIMES DAILY -     clonazePAM (KLONOPIN) 0.5 MG tablet; Take 1 tablet (0.5 mg total) by mouth 2 (two) times daily as needed for anxiety.  Panic attacks  Major depressive disorder, recurrent episode, moderate ([place])    Please see After Visit Summary for patient specific instructions.  Future Appointments  Date Time Provider [place]  08/19/2020  1:00 PM Gerri Lins [facility] None  09/11/2020  3:40 PM Mosie Lukes, MD [facility]    No orders of the defined types were placed in this encounter.   -------------------------------

## 2020-08-12 ENCOUNTER — Other Ambulatory Visit: Payer: Self-pay | Admitting: Family Medicine

## 2020-08-15 ENCOUNTER — Telehealth: Payer: Self-pay | Admitting: Adult Health

## 2020-08-15 NOTE — Telephone Encounter (Signed)
Pt called reporting the Clonazepam is not working for her. Very anxious, shaky, can't sleep. Tried cutting in half and same effects. Very tired and can't sleep. Can she try something else?  Pt # [phone]

## 2020-08-15 NOTE — Telephone Encounter (Signed)
Last visit you discontinued the Ativan 1 mg, changed to Clonazepam but reporting she's having side effects and wants another change?

## 2020-08-19 ENCOUNTER — Ambulatory Visit (INDEPENDENT_AMBULATORY_CARE_PROVIDER_SITE_OTHER): Payer: 59 | Admitting: Psychology

## 2020-08-19 DIAGNOSIS — F411 Generalized anxiety disorder: Secondary | ICD-10-CM | POA: Diagnosis not present

## 2020-08-27 ENCOUNTER — Other Ambulatory Visit: Payer: Self-pay | Admitting: Family Medicine

## 2020-08-27 DIAGNOSIS — Z1231 Encounter for screening mammogram for malignant neoplasm of breast: Secondary | ICD-10-CM

## 2020-08-28 ENCOUNTER — Ambulatory Visit: Payer: 59 | Admitting: Adult Health

## 2020-08-29 ENCOUNTER — Other Ambulatory Visit: Payer: Self-pay

## 2020-08-29 ENCOUNTER — Telehealth: Payer: Self-pay | Admitting: Adult Health

## 2020-08-29 ENCOUNTER — Ambulatory Visit (INDEPENDENT_AMBULATORY_CARE_PROVIDER_SITE_OTHER): Payer: 59 | Admitting: Psychology

## 2020-08-29 ENCOUNTER — Ambulatory Visit
Admission: RE | Admit: 2020-08-29 | Discharge: 2020-08-29 | Disposition: A | Discharge status: Home / Self Care | Payer: 59 | Source: Ambulatory Visit | Attending: Family Medicine | Admitting: Family Medicine

## 2020-08-29 DIAGNOSIS — F411 Generalized anxiety disorder: Secondary | ICD-10-CM

## 2020-08-29 DIAGNOSIS — Z1231 Encounter for screening mammogram for malignant neoplasm of breast: Secondary | ICD-10-CM

## 2020-08-29 MED ORDER — ALPRAZOLAM 0.5 MG PO TABS: 1.0000 mg | ORAL_TABLET | Freq: Two times a day (BID) | ORAL | 0 refills | Status: DC | PRN

## 2020-08-29 NOTE — Telephone Encounter (Signed)
Please see message from 7/15

## 2020-08-29 NOTE — Telephone Encounter (Signed)
We can try Xanax 0.'5mg'$  BID. Ok to pend if agreeable.

## 2020-08-29 NOTE — Telephone Encounter (Signed)
Script sent  

## 2020-08-29 NOTE — Telephone Encounter (Signed)
Pended.

## 2020-08-29 NOTE — Telephone Encounter (Signed)
Pt LVM stating that she hasn't heard anything back regarding her medication change, and that she has been waiting about a week to hear something back. Pls RTC [phone].

## 2020-09-03 ENCOUNTER — Ambulatory Visit (INDEPENDENT_AMBULATORY_CARE_PROVIDER_SITE_OTHER): Payer: 59 | Admitting: Psychology

## 2020-09-03 ENCOUNTER — Other Ambulatory Visit: Payer: Self-pay | Admitting: Family Medicine

## 2020-09-03 DIAGNOSIS — R928 Other abnormal and inconclusive findings on diagnostic imaging of breast: Secondary | ICD-10-CM

## 2020-09-03 DIAGNOSIS — F411 Generalized anxiety disorder: Secondary | ICD-10-CM

## 2020-09-04 ENCOUNTER — Ambulatory Visit (INDEPENDENT_AMBULATORY_CARE_PROVIDER_SITE_OTHER): Payer: 59 | Admitting: Adult Health

## 2020-09-04 ENCOUNTER — Other Ambulatory Visit: Payer: Self-pay

## 2020-09-04 ENCOUNTER — Encounter: Payer: Self-pay | Admitting: Adult Health

## 2020-09-04 DIAGNOSIS — F410 Panic disorder [episodic paroxysmal anxiety] without agoraphobia: Secondary | ICD-10-CM | POA: Diagnosis not present

## 2020-09-04 DIAGNOSIS — F411 Generalized anxiety disorder: Secondary | ICD-10-CM

## 2020-09-04 DIAGNOSIS — G4700 Insomnia, unspecified: Secondary | ICD-10-CM | POA: Diagnosis not present

## 2020-09-04 DIAGNOSIS — F331 Major depressive disorder, recurrent, moderate: Secondary | ICD-10-CM | POA: Diagnosis not present

## 2020-09-04 MED ORDER — ALPRAZOLAM 0.5 MG PO TABS: 1.0000 mg | ORAL_TABLET | Freq: Two times a day (BID) | ORAL | 2 refills | Status: DC

## 2020-09-04 MED ORDER — VENLAFAXINE HCL ER 75 MG PO CP24: ORAL_CAPSULE | ORAL | 1 refills | Status: DC

## 2020-09-04 NOTE — Progress Notes (Signed)
NORENA GOODINE [id number] 07-17-1970 50 y.o.  Subjective:   Patient ID:  Lauren Hall is a 50 y.o. (DOB 1970/08/27) female.  Chief Complaint: No chief complaint on file.   HPI Lauren Hall presents to the office today for follow-up of MDD, GAD, insomnia, and panic attacks.  Describes mood today as "ok". Pleasant. Deceased tearfulness. Mood symptoms - reports decreased depression, anxiety and irritability. Denies panic attacks since starting Xanax. Feels like addition of Xanax has been helpful. Did not tolerate Ativan or Clonazepam. Stating "I feel more even keeled". Ongoing situational stressors. Working to settle [employer]. Conflict with step sisters. Has decided to set up some boundaries. Husband in dialysis - recently lost his job. Daughter having mental health issues.Stating "I'm still really stressed". Varying interest and motivation. Taking medications as prescribed.  Energy levels improved. Active, does not have a regular exercise routine.  Enjoys some usual interests and activities. Married. Lives with husband of 20 years. Has one daughter - age 28.  Appetite adequate. Weight stable.  Sleeps well most nights. Averages 8 to 9 hours. Focus and concentration has been better. Completing tasks. Managing some aspects of household. 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  Denies AH or VH.  Previous medication trials: Pristiq, Wellbutrin, Prozac, Cymbalta, Lexapro, Zoloft,    Review of Systems:  Review of Systems  Musculoskeletal:  Negative for gait problem.  Neurological:  Negative for tremors.  Psychiatric/Behavioral:         Please refer to HPI   Medications: I have reviewed the patient's current medications.  Current Outpatient Medications  Medication Sig Dispense Refill   ALPRAZolam (XANAX) 0.5 MG tablet Take 2 tablets (1 mg total) by mouth 2 (two) times daily. 60 tablet 2   ARIPiprazole (ABILIFY) 5 MG tablet Take 1 tablet (5 mg total) by mouth daily. 30  tablet 5   atorvastatin (LIPITOR) 10 MG tablet Take 0.5 tablets (5 mg total) by mouth daily. 45 tablet 1   busPIRone (BUSPAR) 5 MG tablet TAKE 1 TABLET(5 MG) BY MOUTH THREE TIMES DAILY 90 tablet 5   cyclobenzaprine (FLEXERIL) 10 MG tablet TAKE 1/2 TO 1 TABLET(5 TO 10 MG) BY MOUTH THREE TIMES DAILY AS NEEDED FOR MUSCLE SPASMS 90 tablet 0   diltiazem (CARDIZEM CD) 240 MG 24 hr capsule Take 1 capsule (240 mg total) by mouth daily. 90 capsule 3   gabapentin (NEURONTIN) 300 MG capsule TAKE 1 CAPSULE BY MOUTH TWICE A DAY AND 2 CAPSULES AT BEDTIME 360 capsule 1   levothyroxine (SYNTHROID) 25 MCG tablet Take 1 tablet (25 mcg total) by mouth daily before breakfast. 90 tablet 1   lisinopril (ZESTRIL) 10 MG tablet Take 1 tablet (10 mg total) by mouth in the morning and at bedtime. 60 tablet 0   LOW-DOSE ASPIRIN PO      metoprolol succinate (TOPROL-XL) 25 MG 24 hr tablet Take 1 tablet (25 mg total) by mouth daily. 90 tablet 0   omeprazole (PRILOSEC) 40 MG capsule Take 1 capsule (40 mg total) by mouth daily. 90 capsule 0   ondansetron (ZOFRAN) 4 MG tablet Take 1 tablet (4 mg total) by mouth every 8 (eight) hours as needed for nausea or vomiting. 20 tablet 1   venlafaxine XR (EFFEXOR-XR) 75 MG 24 hr capsule TAKE 1 CAPSULE(75 MG) BY MOUTH AT BEDTIME 90 capsule 1   No current facility-administered medications for this visit.    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   Bronchitis 07/27/2011   Chiari malformation [id number]   Complication of anesthesia    patient woke up while they were sewing up her incision with breast lumpectomy   Depression    Dysrhythmia    Tachycardia   Enlarged thyroid    Fatigue 10/08/2010   Fibromyalgia 2009   GERD (gastroesophageal reflux disease) 09/26/2013   Headache(784.0) 10/08/2010   Migraines    Hip pain, bilateral 11/06/2010   Hyperglycemia 08/11/2015   normal Hgb A 1C per patient   Hypertension    Hypothyroidism 10/08/2010   Low back pain radiating to right leg 03/11/2015   Other and unspecified hyperlipidemia 07/29/2012   Peripheral neuropathy 11/11/2012   burning sensation mainly right leg   Poor concentration 01/11/2011   Preventative health care 07/29/2012   S/P laparoscopic hysterectomy 10/07/2015   Sinusitis, acute 07/27/2011   Tachycardia    Thyroid disease 10/08/2010   Tick bite of flank 11/09/2011   Tinea corporis 11/11/2012   Uterine leiomyoma     Past Medical History, Surgical history, Social history, and Family history were reviewed and updated as appropriate.   Please see review of systems for further details on the patient's review from today.   Objective:   Physical Exam:  LMP 09/02/2015 (Approximate) Comment: continous bleeding  Physical Exam Constitutional:      General: She is not in acute distress. Musculoskeletal:        General: No deformity.  Neurological:     Mental Status: She is alert and oriented to person, place, and time.     Coordination: Coordination normal.  Psychiatric:        Attention and Perception: Attention and perception normal. She does not perceive auditory or visual hallucinations.        Mood and Affect: Mood normal. Mood is not anxious or depressed. Affect is not labile, blunt, angry or inappropriate.        Speech: Speech normal.        Behavior: Behavior normal.        Thought Content: Thought content normal. Thought content is not paranoid or delusional. Thought content does not include homicidal or suicidal ideation. Thought content does not include homicidal or suicidal plan.        Cognition and Memory: Cognition and memory normal.        Judgment: Judgment normal.     Comments: Insight intact    Lab Review:     Component Value Date/Time   NA 139 05/08/2019 1517   K 4.4 05/08/2019 1517   CL 101 05/08/2019 1517   CO2  30 05/08/2019 1517   GLUCOSE 85 05/08/2019 1517   BUN 5 (L) 05/08/2019 1517   CREATININE 0.78 05/08/2019 1517   CREATININE 0.79 09/24/2013 1544   CALCIUM 9.4 05/08/2019 1517   PROT 7.1 05/08/2019 1517   ALBUMIN 4.4 05/08/2019 1517   AST 23 05/08/2019 1517   ALT 27 05/08/2019 1517   ALKPHOS 122 (H) 05/08/2019 1517   BILITOT 0.3 05/08/2019 1517   GFRNONAA >60 01/17/2018 0128   GFRAA >60 01/17/2018 0128       Component Value Date/Time   WBC 4.9 05/08/2019 1517   RBC 4.21 05/08/2019 1517   HGB 13.1 05/08/2019 1517   HCT 39.0 05/08/2019 1517   PLT 351.0 05/08/2019 1517   MCV 92.5  05/08/2019 1517   MCH 30.4 01/17/2018 0128   MCHC 33.7 05/08/2019 1517   RDW 13.8 05/08/2019 1517   LYMPHSABS 2.7 03/07/2012 1556   MONOABS 0.6 03/07/2012 1556   EOSABS 0.1 03/07/2012 1556   BASOSABS 0.0 03/07/2012 1556    No results found for: POCLITH, LITHIUM   No results found for: PHENYTOIN, PHENOBARB, VALPROATE, CBMZ   .res Assessment: Plan:    Plan:  PDMP reviewed  1. Buspar '10mg'$  TID 2. Effexor XR '75mg'$  daily   3. Abilify 5 mg daily  4. Xanax 0.'5mg'$  BID  Therapist - Marya Fossa  RTC 4 weeks  Patient advised to contact office with any questions, adverse effects, or acute worsening in signs and symptoms.  Discussed potential benefits, risk, and side effects of benzodiazepines to include potential risk of tolerance and dependence, as well as possible drowsiness.  Advised patient not to drive if experiencing drowsiness and to take lowest possible effective dose to minimize risk of dependence and tolerance.  Discussed potential metabolic side effects associated with atypical antipsychotics, as well as potential risk for movement side effects. Advised pt to contact office if movement side effects occur.    Diagnoses and all orders for this visit:  Major depressive disorder, recurrent episode, moderate ([facility])  Generalized anxiety disorder -     ALPRAZolam (XANAX) 0.5 MG tablet; Take 2  tablets (1 mg total) by mouth 2 (two) times daily. -     venlafaxine XR (EFFEXOR-XR) 75 MG 24 hr capsule; TAKE 1 CAPSULE(75 MG) BY MOUTH AT BEDTIME  Panic attacks  Insomnia, unspecified type    Please see After Visit Summary for patient specific instructions.  Future Appointments  Date Time Provider [place]  09/11/2020  3:40 PM Mosie Lukes, MD [facility]  09/12/2020  9:10 AM [facility] [place] [facility]  09/12/2020  9:20 AM [facility] [place] [facility]    No orders of the defined types were placed in this encounter.   -------------------------------

## 2020-09-10 ENCOUNTER — Ambulatory Visit (INDEPENDENT_AMBULATORY_CARE_PROVIDER_SITE_OTHER): Payer: 59 | Admitting: Psychology

## 2020-09-10 DIAGNOSIS — F411 Generalized anxiety disorder: Secondary | ICD-10-CM

## 2020-09-11 ENCOUNTER — Ambulatory Visit: Payer: 59 | Admitting: Psychology

## 2020-09-11 ENCOUNTER — Telehealth (INDEPENDENT_AMBULATORY_CARE_PROVIDER_SITE_OTHER): Payer: 59 | Admitting: Family Medicine

## 2020-09-11 ENCOUNTER — Other Ambulatory Visit: Payer: Self-pay

## 2020-09-11 DIAGNOSIS — E782 Mixed hyperlipidemia: Secondary | ICD-10-CM

## 2020-09-11 DIAGNOSIS — E039 Hypothyroidism, unspecified: Secondary | ICD-10-CM

## 2020-09-11 DIAGNOSIS — K219 Gastro-esophageal reflux disease without esophagitis: Secondary | ICD-10-CM

## 2020-09-11 DIAGNOSIS — D649 Anemia, unspecified: Secondary | ICD-10-CM | POA: Diagnosis not present

## 2020-09-11 DIAGNOSIS — R000 Tachycardia, unspecified: Secondary | ICD-10-CM | POA: Diagnosis not present

## 2020-09-11 DIAGNOSIS — I10 Essential (primary) hypertension: Secondary | ICD-10-CM | POA: Diagnosis not present

## 2020-09-11 DIAGNOSIS — F32A Depression, unspecified: Secondary | ICD-10-CM

## 2020-09-11 DIAGNOSIS — R739 Hyperglycemia, unspecified: Secondary | ICD-10-CM

## 2020-09-11 NOTE — Assessment & Plan Note (Signed)
hgba1c acceptable, minimize simple carbs. Increase exercise as tolerated.  

## 2020-09-11 NOTE — Assessment & Plan Note (Signed)
Avoid offending foods, start probiotics. Do not eat large meals in late evening and consider raising head of bed.  

## 2020-09-11 NOTE — Assessment & Plan Note (Signed)
On Levothyroxine, continue to monitor 

## 2020-09-11 NOTE — Assessment & Plan Note (Signed)
Very tearful during the interview but following with psychiatry no recent change in meds.  She has lost her mother and her stepfather in the last year has a strained relationship with her stepsister and continues to manage her own and her daughter struggles with mental health as well.

## 2020-09-11 NOTE — Assessment & Plan Note (Signed)
Encourage heart healthy diet such as MIND or DASH diet, increase exercise, avoid trans fats, simple carbohydrates and processed foods, consider a krill or fish or flaxseed oil cap daily.  °

## 2020-09-11 NOTE — Assessment & Plan Note (Signed)
Monitor and report any concerns. no changes to meds. Encouraged heart healthy diet such as the DASH diet and exercise as tolerated.  

## 2020-09-11 NOTE — Progress Notes (Signed)
MyChart Video Visit    Virtual Visit via Video Note   This visit type was conducted due to national recommendations for restrictions regarding the COVID-19 Pandemic (e.g. social distancing) in an effort to limit this patient's exposure and mitigate transmission in our community. This patient is at least at moderate risk for complications without adequate follow up. This format is felt to be most appropriate for this patient at this time. Physical exam was limited by quality of the video and audio technology used for the visit. S Chism, [name] was able to get the patient set up on a video visit.  Patient location: home Patient and provider in visit Provider location: Office  I discussed the limitations of evaluation and management by telemedicine and the availability of in person appointments. The patient expressed understanding and agreed to proceed.  Visit Date: 09/11/2020  Today's healthcare provider: Penni Homans, MD     Subjective:    Patient ID: Lauren Hall, female    DOB: May 31, 1970, 50 y.o.   MRN: [id number]  No chief complaint on file.   HPI Patient is in today for follow up on chronic medical concerns. No recent febrile illness or hospitalizations. Very tearful during the interview but following with psychiatry no recent change in meds.  She has lost her mother and her stepfather in the last year has a strained relationship with her stepsister and continues to manage her own and her daughter struggles with mental health as well. Denies CP/palp/SOB/HA/congestion/fevers/GI or GU c/o. 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   Bronchitis 07/27/2011   Chiari malformation [id number]   Complication of anesthesia    patient woke up while they were sewing up her incision with breast lumpectomy   Depression    Dysrhythmia    Tachycardia   Enlarged thyroid    Fatigue 10/08/2010    Fibromyalgia 2009   GERD (gastroesophageal reflux disease) 09/26/2013   Headache(784.0) 10/08/2010   Migraines   Hip pain, bilateral 11/06/2010   Hyperglycemia 08/11/2015   normal Hgb A 1C per patient   Hypertension    Hypothyroidism 10/08/2010   Low back pain radiating to right leg 03/11/2015   Other and unspecified hyperlipidemia 07/29/2012   Peripheral neuropathy 11/11/2012   burning sensation mainly right leg   Poor concentration 01/11/2011   Preventative health care 07/29/2012   S/P laparoscopic hysterectomy 10/07/2015   Sinusitis, acute 07/27/2011   Tachycardia    Thyroid disease 10/08/2010   Tick bite of flank 11/09/2011   Tinea corporis 11/11/2012   Uterine leiomyoma     Past Surgical History:  Procedure Laterality Date   BREAST LUMPECTOMY Right 1999   benign    CYSTOSCOPY N/A 10/07/2015   Procedure: CYSTOSCOPY;  Surgeon: Sanjuana Kava, MD;  Location: [facility];  Service: Gynecology;  Laterality: N/A;    Family History  Problem Relation Age of Onset   Fibromyalgia Mother    Diabetes Mother        type 2   Hypertension Mother    Hyperlipidemia Mother    Arthritis Mother    Alcohol abuse Father    Colon cancer Father    Hypertension Brother    Alcohol abuse Brother    Heart disease Maternal Grandmother    Hyperlipidemia Maternal Grandmother    Diabetes Maternal Grandmother        type 2  Colon cancer Maternal Grandmother    Stroke Maternal Grandfather    Diabetes Maternal Grandfather        type 2   Ovarian cancer Paternal Grandmother    Alzheimer's disease Paternal Grandfather     Social History   Socioeconomic History   Marital status: Married    Spouse name: Derrick   Number of children: Not on file   Years of education: Not on file   Highest education level: Not on file  Occupational History   Not on file  Tobacco Use   Smoking status: Never   Smokeless tobacco: Never  Substance and Sexual Activity   Alcohol use: Yes    Comment: special occassion- very  rarely   Drug use: No   Sexual activity: Yes    Partners: Male    Birth control/protection: None  Other Topics Concern   Not on file  Social History Narrative   Not on file   Social Determinants of Health   Financial Resource Strain: Not on file  Food Insecurity: Not on file  Transportation Needs: Not on file  Physical Activity: Not on file  Stress: Not on file  Social Connections: Not on file  Intimate Partner Violence: Not on file    Outpatient Medications Prior to Visit  Medication Sig Dispense Refill   ALPRAZolam (XANAX) 0.5 MG tablet Take 2 tablets (1 mg total) by mouth 2 (two) times daily. 60 tablet 2   ARIPiprazole (ABILIFY) 5 MG tablet Take 1 tablet (5 mg total) by mouth daily. 30 tablet 5   atorvastatin (LIPITOR) 10 MG tablet Take 0.5 tablets (5 mg total) by mouth daily. 45 tablet 1   busPIRone (BUSPAR) 5 MG tablet TAKE 1 TABLET(5 MG) BY MOUTH THREE TIMES DAILY 90 tablet 5   cyclobenzaprine (FLEXERIL) 10 MG tablet TAKE 1/2 TO 1 TABLET(5 TO 10 MG) BY MOUTH THREE TIMES DAILY AS NEEDED FOR MUSCLE SPASMS 90 tablet 0   gabapentin (NEURONTIN) 300 MG capsule TAKE 1 CAPSULE BY MOUTH TWICE A DAY AND 2 CAPSULES AT BEDTIME 360 capsule 1   levothyroxine (SYNTHROID) 25 MCG tablet Take 1 tablet (25 mcg total) by mouth daily before breakfast. 90 tablet 1   lisinopril (ZESTRIL) 10 MG tablet Take 1 tablet (10 mg total) by mouth in the morning and at bedtime. 60 tablet 0   LOW-DOSE ASPIRIN PO      metoprolol succinate (TOPROL-XL) 25 MG 24 hr tablet Take 1 tablet (25 mg total) by mouth daily. 90 tablet 0   omeprazole (PRILOSEC) 40 MG capsule Take 1 capsule (40 mg total) by mouth daily. 90 capsule 0   ondansetron (ZOFRAN) 4 MG tablet Take 1 tablet (4 mg total) by mouth every 8 (eight) hours as needed for nausea or vomiting. 20 tablet 1   venlafaxine XR (EFFEXOR-XR) 75 MG 24 hr capsule TAKE 1 CAPSULE(75 MG) BY MOUTH AT BEDTIME 90 capsule 1   diltiazem (CARDIZEM CD) 240 MG 24 hr capsule Take 1  capsule (240 mg total) by mouth daily. 90 capsule 3   No facility-administered medications prior to visit.    Allergies  Allergen Reactions   Promethazine Anaphylaxis    Review of Systems  Constitutional:  Positive for malaise/fatigue. Negative for fever.  HENT:  Negative for congestion.   Eyes:  Negative for blurred vision.  Respiratory:  Negative for shortness of breath.   Cardiovascular:  Negative for chest pain, palpitations and leg swelling.  Gastrointestinal:  Negative for abdominal pain, blood in stool and  nausea.  Genitourinary:  Negative for dysuria and frequency.  Musculoskeletal:  Positive for back pain and myalgias. Negative for falls.  Skin:  Negative for rash.  Neurological:  Negative for dizziness, loss of consciousness and headaches.  Endo/Heme/Allergies:  Negative for environmental allergies.  Psychiatric/Behavioral:  Positive for depression. The patient is nervous/anxious and has insomnia.       Objective:    Physical Exam Constitutional:      General: She is not in acute distress.    Appearance: Normal appearance. She is not ill-appearing or toxic-appearing.  HENT:     Head: Normocephalic and atraumatic.     Right Ear: External ear normal.     Left Ear: External ear normal.     Nose: Nose normal.  Eyes:     General:        Right eye: No discharge.        Left eye: No discharge.  Pulmonary:     Effort: Pulmonary effort is normal.  Skin:    Findings: No rash.  Neurological:     Mental Status: She is alert and oriented to person, place, and time.  Psychiatric:        Behavior: Behavior normal.    LMP 09/02/2015 (Approximate) Comment: continous bleeding Wt Readings from Last 3 Encounters:  06/27/19 163 lb (73.9 kg)  06/08/19 157 lb (71.2 kg)  06/04/19 157 lb (71.2 kg)    Diabetic Foot Exam - Simple   No data filed    Lab Results  Component Value Date   WBC 4.9 05/08/2019   HGB 13.1 05/08/2019   HCT 39.0 05/08/2019   PLT 351.0 05/08/2019    GLUCOSE 85 05/08/2019   CHOL 206 (H) 05/08/2019   TRIG 177.0 (H) 05/08/2019   HDL 67.60 05/08/2019   LDLCALC 103 (H) 05/08/2019   ALT 27 05/08/2019   AST 23 05/08/2019   NA 139 05/08/2019   K 4.4 05/08/2019   CL 101 05/08/2019   CREATININE 0.78 05/08/2019   BUN 5 (L) 05/08/2019   CO2 30 05/08/2019   TSH 0.46 05/08/2019   HGBA1C 5.7 05/08/2019    Lab Results  Component Value Date   TSH 0.46 05/08/2019   Lab Results  Component Value Date   WBC 4.9 05/08/2019   HGB 13.1 05/08/2019   HCT 39.0 05/08/2019   MCV 92.5 05/08/2019   PLT 351.0 05/08/2019   Lab Results  Component Value Date   NA 139 05/08/2019   K 4.4 05/08/2019   CO2 30 05/08/2019   GLUCOSE 85 05/08/2019   BUN 5 (L) 05/08/2019   CREATININE 0.78 05/08/2019   BILITOT 0.3 05/08/2019   ALKPHOS 122 (H) 05/08/2019   AST 23 05/08/2019   ALT 27 05/08/2019   PROT 7.1 05/08/2019   ALBUMIN 4.4 05/08/2019   CALCIUM 9.4 05/08/2019   ANIONGAP 12 01/17/2018   GFR 94.42 05/08/2019   Lab Results  Component Value Date   CHOL 206 (H) 05/08/2019   Lab Results  Component Value Date   HDL 67.60 05/08/2019   Lab Results  Component Value Date   LDLCALC 103 (H) 05/08/2019   Lab Results  Component Value Date   TRIG 177.0 (H) 05/08/2019   Lab Results  Component Value Date   CHOLHDL 3 05/08/2019   Lab Results  Component Value Date   HGBA1C 5.7 05/08/2019       Assessment & Plan:   Problem List Items Addressed This Visit     Anemia - Primary  Depression    Very tearful during the interview but following with psychiatry no recent change in meds.  She has lost her mother and her stepfather in the last year has a strained relationship with her stepsister and continues to manage her own and her daughter struggles with mental health as well.      Hypertension    Monitor and report any concerns no changes to meds. Encouraged heart healthy diet such as the DASH diet and exercise as tolerated.       Relevant  Orders   CBC   Comprehensive metabolic panel   TSH   Hypothyroidism    On Levothyroxine, continue to monitor      Tachycardia   Hyperlipidemia    Encourage heart healthy diet such as MIND or DASH diet, increase exercise, avoid trans fats, simple carbohydrates and processed foods, consider a krill or fish or flaxseed oil cap daily.       Relevant Orders   Lipid panel   GERD (gastroesophageal reflux disease)    Avoid offending foods, start probiotics. Do not eat large meals in late evening and consider raising head of bed.       Hyperglycemia    hgba1c acceptable, minimize simple carbs. Increase exercise as tolerated.      Relevant Orders   Hemoglobin A1c    I am having Deissy K. [place] maintain her ondansetron, LOW-DOSE ASPIRIN PO, diltiazem, atorvastatin, levothyroxine, omeprazole, metoprolol succinate, gabapentin, lisinopril, ARIPiprazole, busPIRone, cyclobenzaprine, ALPRAZolam, and venlafaxine XR.  No orders of the defined types were placed in this encounter.   I discussed the assessment and treatment plan with the patient. The patient was provided an opportunity to ask questions and all were answered. The patient agreed with the plan and demonstrated an understanding of the instructions.   The patient was advised to call back or seek an in-person evaluation if the symptoms worsen or if the condition fails to improve as anticipated.  I provided 22 minutes of face-to-face time during this encounter.   Penni Homans, MD [facility] [phone] (phone) [phone] (fax)  [place]

## 2020-09-12 ENCOUNTER — Ambulatory Visit
Admission: RE | Admit: 2020-09-12 | Discharge: 2020-09-12 | Disposition: A | Discharge status: Home / Self Care | Payer: 59 | Source: Ambulatory Visit | Attending: Family Medicine | Admitting: Family Medicine

## 2020-09-12 ENCOUNTER — Other Ambulatory Visit: Payer: Self-pay | Admitting: Family Medicine

## 2020-09-12 ENCOUNTER — Other Ambulatory Visit: Payer: Self-pay

## 2020-09-12 DIAGNOSIS — R928 Other abnormal and inconclusive findings on diagnostic imaging of breast: Secondary | ICD-10-CM

## 2020-09-19 ENCOUNTER — Other Ambulatory Visit (INDEPENDENT_AMBULATORY_CARE_PROVIDER_SITE_OTHER): Payer: 59

## 2020-09-19 DIAGNOSIS — E782 Mixed hyperlipidemia: Secondary | ICD-10-CM | POA: Diagnosis not present

## 2020-09-19 DIAGNOSIS — I10 Essential (primary) hypertension: Secondary | ICD-10-CM

## 2020-09-19 DIAGNOSIS — R739 Hyperglycemia, unspecified: Secondary | ICD-10-CM

## 2020-09-19 LAB — COMPREHENSIVE METABOLIC PANEL
ALT: 64 U/L — ABNORMAL HIGH (ref 0–35)
AST: 39 U/L — ABNORMAL HIGH (ref 0–37)
Albumin: 4.2 g/dL (ref 3.5–5.2)
Alkaline Phosphatase: 164 U/L — ABNORMAL HIGH (ref 39–117)
BUN: 6 mg/dL (ref 6–23)
CO2: 28 mEq/L (ref 19–32)
Calcium: 9.5 mg/dL (ref 8.4–10.5)
Chloride: 102 mEq/L (ref 96–112)
Creatinine, Ser: 0.8 mg/dL (ref 0.40–1.20)
GFR: 85.07 mL/min (ref >60.00)
Glucose, Bld: 150 mg/dL — ABNORMAL HIGH (ref 70–99)
Potassium: 4.2 mEq/L (ref 3.5–5.1)
Sodium: 138 mEq/L (ref 135–145)
Total Bilirubin: 0.3 mg/dL (ref 0.2–1.2)
Total Protein: 7.3 g/dL (ref 6.0–8.3)

## 2020-09-19 LAB — TSH: TSH: 0.76 u[IU]/mL (ref 0.35–5.50)

## 2020-09-19 LAB — LIPID PANEL
Cholesterol: 222 mg/dL — ABNORMAL HIGH (ref 0–200)
HDL: 50 mg/dL (ref >39.00)
NonHDL: 172.26
Total CHOL/HDL Ratio: 4
Triglycerides: 247 mg/dL — ABNORMAL HIGH (ref 0.0–149.0)
VLDL: 49.4 mg/dL — ABNORMAL HIGH (ref 0.0–40.0)

## 2020-09-19 LAB — HEMOGLOBIN A1C: Hgb A1c MFr Bld: 6.2 % (ref 4.6–6.5)

## 2020-09-19 LAB — CBC
HCT: 40 % (ref 36.0–46.0)
Hemoglobin: 13.4 g/dL (ref 12.0–15.0)
MCHC: 33.5 g/dL (ref 30.0–36.0)
MCV: 90.5 fl (ref 78.0–100.0)
Platelets: 330 10*3/uL (ref 150.0–400.0)
RBC: 4.42 Mil/uL (ref 3.87–5.11)
RDW: 14.9 % (ref 11.5–15.5)
WBC: 6.5 10*3/uL (ref 4.0–10.5)

## 2020-09-19 LAB — LDL CHOLESTEROL, DIRECT: Direct LDL: 146 mg/dL

## 2020-09-22 ENCOUNTER — Other Ambulatory Visit: Payer: Self-pay | Admitting: Family Medicine

## 2020-09-24 ENCOUNTER — Other Ambulatory Visit: Payer: Self-pay

## 2020-09-24 DIAGNOSIS — R748 Abnormal levels of other serum enzymes: Secondary | ICD-10-CM

## 2020-09-25 NOTE — Telephone Encounter (Signed)
Requesting: cyclobenzaprine '10mg'$  Last Visit: 09/11/2020 Next Visit: 11/13/2020 Last Refill: 08/13/2020 #90, 0RF  Please Advise

## 2020-10-08 ENCOUNTER — Other Ambulatory Visit: Payer: Self-pay

## 2020-11-04 ENCOUNTER — Other Ambulatory Visit: Payer: Self-pay | Admitting: Family Medicine

## 2020-11-04 ENCOUNTER — Ambulatory Visit: Payer: 59 | Admitting: Adult Health

## 2020-11-09 ENCOUNTER — Other Ambulatory Visit: Payer: Self-pay | Admitting: Family Medicine

## 2020-11-13 ENCOUNTER — Encounter: Payer: Self-pay | Admitting: Family Medicine

## 2020-11-13 ENCOUNTER — Other Ambulatory Visit: Payer: Self-pay

## 2020-11-13 ENCOUNTER — Ambulatory Visit (INDEPENDENT_AMBULATORY_CARE_PROVIDER_SITE_OTHER): Payer: Self-pay | Admitting: Family Medicine

## 2020-11-13 VITALS — BP 124/72 | HR 118 | Temp 99.2°F | Resp 16 | Wt 183.0 lb

## 2020-11-13 DIAGNOSIS — R140 Abdominal distension (gaseous): Secondary | ICD-10-CM

## 2020-11-13 DIAGNOSIS — R110 Nausea: Secondary | ICD-10-CM

## 2020-11-13 DIAGNOSIS — R739 Hyperglycemia, unspecified: Secondary | ICD-10-CM

## 2020-11-13 DIAGNOSIS — E039 Hypothyroidism, unspecified: Secondary | ICD-10-CM

## 2020-11-13 DIAGNOSIS — F419 Anxiety disorder, unspecified: Secondary | ICD-10-CM

## 2020-11-13 DIAGNOSIS — R1011 Right upper quadrant pain: Secondary | ICD-10-CM

## 2020-11-13 DIAGNOSIS — R12 Heartburn: Secondary | ICD-10-CM

## 2020-11-13 DIAGNOSIS — E782 Mixed hyperlipidemia: Secondary | ICD-10-CM

## 2020-11-13 DIAGNOSIS — I10 Essential (primary) hypertension: Secondary | ICD-10-CM

## 2020-11-13 MED ORDER — LEVOTHYROXINE SODIUM 25 MCG PO TABS: ORAL_TABLET | ORAL | 5 refills | Status: DC

## 2020-11-13 MED ORDER — ATORVASTATIN CALCIUM 10 MG PO TABS: 5.0000 mg | ORAL_TABLET | Freq: Every day | ORAL | 5 refills | Status: AC

## 2020-11-13 MED ORDER — LISINOPRIL 10 MG PO TABS: 10.0000 mg | ORAL_TABLET | Freq: Two times a day (BID) | ORAL | 5 refills | Status: DC

## 2020-11-13 NOTE — Patient Instructions (Signed)
Bland Diet  A bland diet consists of foods that are often soft and do not have a lot of fat, fiber, or extra seasonings. Foods without fat, fiber, or seasoning are easier for the body to digest. They are also less likely to irritate your mouth, throat, stomach, and other parts of your digestive system. A bland diet is sometimes called a BRAT diet. What is my plan? Your health care provider or food and nutrition specialist (dietitian) may recommend specific changes to your diet to prevent symptoms or to treat your symptoms. These changes may include: Eating small meals often. Cooking food until it is soft enough to chew easily. Chewing your food well. Drinking fluids slowly. Not eating foods that are very spicy, sour, or fatty. Not eating citrus fruits, such as oranges and grapefruit. What do I need to know about this diet? Eat a variety of foods from the bland diet food list. Do not follow a bland diet longer than needed. Ask your health care provider whether you should take vitamins or supplements. What foods can I eat? Grains Hot cereals, such as cream of wheat. Rice. Bread, crackers, or tortillas made from refined white flour. Vegetables Canned or cooked vegetables. Mashed or boiled potatoes. Fruits Bananas. Applesauce. Other types of cooked or canned fruit with the skin and seeds removed, such as canned peaches or pears. Meats and other proteins Scrambled eggs. Creamy peanut butter or other nut butters. Lean, well-cooked meats, such as chicken or fish. Tofu. Soups or broths. Dairy Low-fat dairy products, such as milk, cottage cheese, or yogurt. Beverages Water. Herbal tea. Apple juice. Fats and oils Mild salad dressings. Canola or olive oil. Sweets and desserts Pudding. Custard. Fruit gelatin. Ice cream. The items listed above may not be a complete list of recommended foods and beverages. Contact a dietitian for more options. What foods are not recommended? Grains Whole grain  breads and cereals. Vegetables Raw vegetables. Fruits Raw fruits, especially citrus, berries, or dried fruits. Dairy Whole fat dairy foods. Beverages Caffeinated drinks. Alcohol. Seasonings and condiments Strongly flavored seasonings or condiments. Hot sauce. Salsa. Other foods Spicy foods. Fried foods. Sour foods, such as pickled or fermented foods. Foods with high sugar content. Foods high in fiber. The items listed above may not be a complete list of foods and beverages to avoid. Contact a dietitian for more information. Summary A bland diet consists of foods that are often soft and do not have a lot of fat, fiber, or extra seasonings. Foods without fat, fiber, or seasoning are easier for the body to digest. Check with your health care provider to see how long you should follow this diet plan. It is not meant to be followed for long periods. This information is not intended to replace advice given to you by your health care provider. Make sure you discuss any questions you have with your health care provider. Document Revised: 02/16/2017 Document Reviewed: 02/16/2017 Elsevier Patient Education  2022 [employer].

## 2020-11-13 NOTE — Progress Notes (Signed)
Patient ID: Lauren Hall, female    DOB: May 23, 1970  Age: 50 y.o. MRN: [id number]    Subjective:   Chief Complaint  Patient presents with   Follow-up   Subjective   HPI Lauren Hall presents for office visit today for follow up on HTN and GERD. She has c/o RUQ pain accompanied by nausea, diarrhea, constipation with having BM's, bloating, heartburn, and gassy. Her symptoms improve when she avoids eating. Episodes last for a couple of days now when normally they would only last for a day. Denies CP/palp/SOB/HA/congestion/fevers or GU c/o. Taking meds as prescribed.  Not tolerant of clonazepam, experienced extra stress.  Review of Systems  Constitutional:  Negative for chills, fatigue and fever.  HENT:  Negative for congestion, rhinorrhea, sinus pressure, sinus pain and sore throat.   Eyes:  Negative for pain.  Respiratory:  Negative for cough and shortness of breath.   Cardiovascular:  Negative for chest pain, palpitations and leg swelling.  Gastrointestinal:  Positive for constipation, diarrhea and nausea. Negative for abdominal pain, blood in stool and vomiting.  Genitourinary:  Negative for decreased urine volume, flank pain, frequency, vaginal bleeding and vaginal discharge.  Musculoskeletal:  Negative for back pain.  Neurological:  Negative for headaches.   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   Bronchitis 07/27/2011   Chiari malformation 4/0/9811   Complication of anesthesia    patient woke up while they were sewing up her incision with breast lumpectomy   Depression    Dysrhythmia    Tachycardia   Enlarged thyroid    Fatigue 10/08/2010   Fibromyalgia 2009   GERD (gastroesophageal reflux disease) 09/26/2013   Headache(784.0) 10/08/2010   Migraines   Hip pain, bilateral 11/06/2010   Hyperglycemia 08/11/2015   normal Hgb A 1C per patient   Hypertension    Hypothyroidism 10/08/2010    Low back pain radiating to right leg 03/11/2015   Other and unspecified hyperlipidemia 07/29/2012   Peripheral neuropathy 11/11/2012   burning sensation mainly right leg   Poor concentration 01/11/2011   Preventative health care 07/29/2012   S/P laparoscopic hysterectomy 10/07/2015   Sinusitis, acute 07/27/2011   Tachycardia    Thyroid disease 10/08/2010   Tick bite of flank 11/09/2011   Tinea corporis 11/11/2012   Uterine leiomyoma     She has a past surgical history that includes Cystoscopy (N/A, 10/07/2015) and Breast lumpectomy (Right, 1999).   Her family history includes Alcohol abuse in her brother and father; Alzheimer's disease in her paternal grandfather; Arthritis in her mother; Colon cancer in her father and maternal grandmother; Diabetes in her maternal grandfather, maternal grandmother, and mother; Fibromyalgia in her mother; Heart disease in her maternal grandmother; Hyperlipidemia in her maternal grandmother and mother; Hypertension in her brother and mother; Ovarian cancer in her paternal grandmother; Stroke in her maternal grandfather.She reports that she has never smoked. She has never used smokeless tobacco. She reports current alcohol use. She reports that she does not use drugs.  Current Outpatient Medications on File Prior to Visit  Medication Sig Dispense Refill   ALPRAZolam (XANAX) 0.5 MG tablet Take 2 tablets (1 mg total) by mouth 2 (two) times daily. 60 tablet 2   ARIPiprazole (ABILIFY) 5 MG tablet Take 1 tablet (5 mg total) by mouth daily. 30 tablet 5   busPIRone (BUSPAR) 5 MG tablet TAKE 1 TABLET(5 MG) BY MOUTH THREE  TIMES DAILY 90 tablet 5   cyclobenzaprine (FLEXERIL) 10 MG tablet TAKE 1/2 TO 1 TABLET(5 TO 10 MG) BY MOUTH THREE TIMES DAILY AS NEEDED FOR MUSCLE SPASMS 90 tablet 0   gabapentin (NEURONTIN) 300 MG capsule TAKE 1 CAPSULE BY MOUTH TWICE A DAY AND 2 CAPSULES AT BEDTIME 360 capsule 1   LOW-DOSE ASPIRIN PO      metoprolol succinate (TOPROL-XL) 25 MG 24 hr tablet  Take 1 tablet (25 mg total) by mouth daily. 90 tablet 0   omeprazole (PRILOSEC) 40 MG capsule Take 1 capsule (40 mg total) by mouth daily. 90 capsule 0   ondansetron (ZOFRAN) 4 MG tablet Take 1 tablet (4 mg total) by mouth every 8 (eight) hours as needed for nausea or vomiting. 20 tablet 1   venlafaxine XR (EFFEXOR-XR) 75 MG 24 hr capsule TAKE 1 CAPSULE(75 MG) BY MOUTH AT BEDTIME 90 capsule 1   diltiazem (CARDIZEM CD) 240 MG 24 hr capsule Take 1 capsule (240 mg total) by mouth daily. 90 capsule 3   No current facility-administered medications on file prior to visit.     Objective:  Objective  Physical Exam Constitutional:      General: She is not in acute distress.    Appearance: Normal appearance. She is not ill-appearing or toxic-appearing.  HENT:     Head: Normocephalic and atraumatic.     Right Ear: Tympanic membrane, ear canal and external ear normal.     Left Ear: Tympanic membrane, ear canal and external ear normal.     Nose: No congestion or rhinorrhea.  Eyes:     Extraocular Movements: Extraocular movements intact.     Pupils: Pupils are equal, round, and reactive to light.  Cardiovascular:     Rate and Rhythm: Normal rate and regular rhythm.     Pulses: Normal pulses.     Heart sounds: Normal heart sounds. No murmur heard. Pulmonary:     Effort: Pulmonary effort is normal. No respiratory distress.     Breath sounds: Normal breath sounds. No wheezing, rhonchi or rales.  Abdominal:     General: Bowel sounds are normal.     Palpations: Abdomen is soft. There is no mass.     Tenderness: There is abdominal tenderness (slight in RUQ). There is no guarding.     Hernia: No hernia is present.  Musculoskeletal:        General: Normal range of motion.     Cervical back: Normal range of motion and neck supple.  Skin:    General: Skin is warm and dry.  Neurological:     Mental Status: She is alert and oriented to person, place, and time.  Psychiatric:        Behavior: Behavior  normal.   BP 124/72   Pulse (!) 118   Temp 99.2 F (37.3 C)   Resp 16   Wt 183 lb (83 kg)   LMP 09/02/2015 (Approximate) Comment: continous bleeding  SpO2 94%   BMI 36.34 kg/m  Wt Readings from Last 3 Encounters:  11/13/20 183 lb (83 kg)  06/27/19 163 lb (73.9 kg)  06/08/19 157 lb (71.2 kg)     Lab Results  Component Value Date   WBC 6.5 09/19/2020   HGB 13.4 09/19/2020   HCT 40.0 09/19/2020   PLT 330.0 09/19/2020   GLUCOSE 150 (H) 09/19/2020   CHOL 222 (H) 09/19/2020   TRIG 247.0 (H) 09/19/2020   HDL 50.00 09/19/2020   LDLDIRECT 146.0 09/19/2020   LDLCALC  103 (H) 05/08/2019   ALT 64 (H) 09/19/2020   AST 39 (H) 09/19/2020   NA 138 09/19/2020   K 4.2 09/19/2020   CL 102 09/19/2020   CREATININE 0.80 09/19/2020   BUN 6 09/19/2020   CO2 28 09/19/2020   TSH 0.76 09/19/2020   HGBA1C 6.2 09/19/2020    US BREAST LTD UNI LEFT INC AXILLA  Result Date: 09/12/2020 CLINICAL DATA:  Screening recall for possible left breast and left axillary mass. EXAM: DIGITAL DIAGNOSTIC UNILATERAL LEFT MAMMOGRAM WITH TOMOSYNTHESIS AND CAD; ULTRASOUND LEFT BREAST LIMITED TECHNIQUE: Left digital diagnostic mammography and breast tomosynthesis was performed. The images were evaluated with computer-aided detection.; Targeted ultrasound examination of the left breast was performed. COMPARISON:  Previous exam(s). ACR Breast Density Category c: The breast tissue is heterogeneously dense, which may obscure small masses. FINDINGS: Spot compression tomosynthesis images reveal a persistent low-density bilobed mass in the upper outer far posterior left breast. Ultrasound targeted to the left breast at 1 o'clock, 5 cm from the nipple demonstrates a near anechoic oval mass measuring 1.3 x 0.5 x 1.2 cm. Ultrasound of the left axilla demonstrates multiple normal-appearing lymph nodes. IMPRESSION: 1. The left breast mass at 1 o'clock most likely represents a benign cyst. 2.  No evidence of left axillary  lymphadenopathy. RECOMMENDATION: Six-month follow-up left breast ultrasound. The option of follow-up versus aspiration was discussed with the patient. At this time she is comfortable with a short-term follow-up. If she changes her mind and would prefer an aspiration sooner, we can schedule her for that procedure. I have discussed the findings and recommendations with the patient. If applicable, a reminder letter will be sent to the patient regarding the next appointment. BI-RADS CATEGORY  3: Probably benign. Electronically Signed   By: Ammie Ferrier M.D.   On: 09/12/2020 09:39  MM DIAG BREAST TOMO UNI LEFT  Result Date: 09/12/2020 CLINICAL DATA:  Screening recall for possible left breast and left axillary mass. EXAM: DIGITAL DIAGNOSTIC UNILATERAL LEFT MAMMOGRAM WITH TOMOSYNTHESIS AND CAD; ULTRASOUND LEFT BREAST LIMITED TECHNIQUE: Left digital diagnostic mammography and breast tomosynthesis was performed. The images were evaluated with computer-aided detection.; Targeted ultrasound examination of the left breast was performed. COMPARISON:  Previous exam(s). ACR Breast Density Category c: The breast tissue is heterogeneously dense, which may obscure small masses. FINDINGS: Spot compression tomosynthesis images reveal a persistent low-density bilobed mass in the upper outer far posterior left breast. Ultrasound targeted to the left breast at 1 o'clock, 5 cm from the nipple demonstrates a near anechoic oval mass measuring 1.3 x 0.5 x 1.2 cm. Ultrasound of the left axilla demonstrates multiple normal-appearing lymph nodes. IMPRESSION: 1. The left breast mass at 1 o'clock most likely represents a benign cyst. 2.  No evidence of left axillary lymphadenopathy. RECOMMENDATION: Six-month follow-up left breast ultrasound. The option of follow-up versus aspiration was discussed with the patient. At this time she is comfortable with a short-term follow-up. If she changes her mind and would prefer an aspiration sooner, we  can schedule her for that procedure. I have discussed the findings and recommendations with the patient. If applicable, a reminder letter will be sent to the patient regarding the next appointment. BI-RADS CATEGORY  3: Probably benign. Electronically Signed   By: Ammie Ferrier M.D.   On: 09/12/2020 09:39    Assessment & Plan:  Plan    Meds ordered this encounter  Medications   atorvastatin (LIPITOR) 10 MG tablet    Sig: Take 0.5 tablets (5 mg total)  by mouth daily.    Dispense:  15 tablet    Refill:  5   levothyroxine (SYNTHROID) 25 MCG tablet    Sig: TAKE 1 TABLET(25 MCG) BY MOUTH DAILY BEFORE BREAKFAST    Dispense:  30 tablet    Refill:  5   lisinopril (ZESTRIL) 10 MG tablet    Sig: Take 1 tablet (10 mg total) by mouth in the morning and at bedtime.    Dispense:  60 tablet    Refill:  5    Requested drug refills are authorized, however, the patient needs further evaluation and/or laboratory testing before further refills are given. Ask her to make an appointment for this.     Problem List Items Addressed This Visit     Hypertension    Well controlled, no changes to meds. Encouraged heart healthy diet such as the DASH diet and exercise as tolerated.       Relevant Medications   atorvastatin (LIPITOR) 10 MG tablet   lisinopril (ZESTRIL) 10 MG tablet   Hypothyroidism    On Levothyroxine, continue to monitor      Relevant Medications   levothyroxine (SYNTHROID) 25 MCG tablet   Hyperlipidemia    Encourage heart healthy diet such as MIND or DASH diet, increase exercise, avoid trans fats, simple carbohydrates and processed foods, consider a krill or fish or flaxseed oil cap daily. Tolerating Atorvastatin      Relevant Medications   atorvastatin (LIPITOR) 10 MG tablet   lisinopril (ZESTRIL) 10 MG tablet   Hyperglycemia    hgba1c acceptable, minimize simple carbs. Increase exercise as tolerated.       Anxiety    Is still under a great deal of stress due to family  illness, financial stressors and more. She feels she is managing fairly well on current meds.      RUQ pain - Primary    She has intermittent RUQ pain, Nausea and abdominal bloating noted. Suspicious for gallbladder disease. Patient without insurance so she will try and minimize fatty, processed and spicy foods to try and minimize symptoms. She will proceed with ultrasound if symptoms persist.       Relevant Orders   US Abdomen Complete   Heartburn    Avoid offending foods, start probiotics. Do not eat large meals in late evening and consider raising head of bed.       Relevant Orders   US Abdomen Complete   Other Visit Diagnoses     Nausea       Relevant Orders   US Abdomen Complete   Abdominal bloating       Relevant Orders   US Abdomen Complete       Follow-up: Return in about 3 months (around 02/23/2021) for f/u around late Jan.  Irish Elders, acting as a scribe for Penni Homans, MD, have documented all relevent documentation on behalf of Penni Homans, MD, as directed by Penni Homans, MD while in the presence of Penni Homans, MD. DO:11/14/20.  I, Mosie Lukes, MD personally performed the services described in this documentation. All medical record entries made by the scribe were at my direction and in my presence. I have reviewed the chart and agree that the record reflects my personal performance and is accurate and complete

## 2020-11-14 ENCOUNTER — Telehealth (HOSPITAL_BASED_OUTPATIENT_CLINIC_OR_DEPARTMENT_OTHER): Payer: Self-pay

## 2020-11-14 DIAGNOSIS — R12 Heartburn: Secondary | ICD-10-CM | POA: Insufficient documentation

## 2020-11-14 DIAGNOSIS — R1011 Right upper quadrant pain: Secondary | ICD-10-CM | POA: Insufficient documentation

## 2020-11-14 NOTE — Assessment & Plan Note (Signed)
Is still under a great deal of stress due to family illness, financial stressors and more. She feels she is managing fairly well on current meds.

## 2020-11-14 NOTE — Assessment & Plan Note (Signed)
Avoid offending foods, start probiotics. Do not eat large meals in late evening and consider raising head of bed.  

## 2020-11-14 NOTE — Assessment & Plan Note (Signed)
Encourage heart healthy diet such as MIND or DASH diet, increase exercise, avoid trans fats, simple carbohydrates and processed foods, consider a krill or fish or flaxseed oil cap daily. Tolerating Atorvastatin 

## 2020-11-14 NOTE — Assessment & Plan Note (Signed)
On Levothyroxine, continue to monitor 

## 2020-11-14 NOTE — Assessment & Plan Note (Signed)
Well controlled, no changes to meds. Encouraged heart healthy diet such as the DASH diet and exercise as tolerated.  °

## 2020-11-14 NOTE — Assessment & Plan Note (Signed)
She has intermittent RUQ pain, Nausea and abdominal bloating noted. Suspicious for gallbladder disease. Patient without insurance so she will try and minimize fatty, processed and spicy foods to try and minimize symptoms. She will proceed with ultrasound if symptoms persist.

## 2020-11-14 NOTE — Assessment & Plan Note (Signed)
hgba1c acceptable, minimize simple carbs. Increase exercise as tolerated.  

## 2020-12-12 ENCOUNTER — Other Ambulatory Visit: Payer: Self-pay | Admitting: Adult Health

## 2020-12-12 DIAGNOSIS — F411 Generalized anxiety disorder: Secondary | ICD-10-CM

## 2020-12-23 ENCOUNTER — Telehealth: Payer: Self-pay | Admitting: Adult Health

## 2020-12-23 NOTE — Telephone Encounter (Signed)
Rx was sent on 11/11.Can you please call her she is requesting a nurse and there is no other message from her

## 2020-12-23 NOTE — Telephone Encounter (Signed)
Left pt message to call back

## 2020-12-23 NOTE — Telephone Encounter (Signed)
Lauren Hall called in for refill on her Xanax 0.5mg . She also asked if nurse could call her back as she lvm earlier. Ph: [phone]. Pharmacy [facility] [address] [place]

## 2020-12-31 ENCOUNTER — Other Ambulatory Visit: Payer: Self-pay | Admitting: Adult Health

## 2020-12-31 DIAGNOSIS — F411 Generalized anxiety disorder: Secondary | ICD-10-CM

## 2020-12-31 MED ORDER — ALPRAZOLAM 0.5 MG PO TABS: ORAL_TABLET | ORAL | 0 refills | Status: DC

## 2020-12-31 NOTE — Telephone Encounter (Signed)
Patient called concerning her Xanax 0.5mg . States that she called pharmacy and was told they didn't receive prescription and to call CR. She would like a rtc to discuss  [phone]

## 2021-01-20 ENCOUNTER — Other Ambulatory Visit: Payer: Self-pay | Admitting: Family Medicine

## 2021-01-20 ENCOUNTER — Telehealth: Payer: Self-pay | Admitting: Family Medicine

## 2021-01-20 NOTE — Telephone Encounter (Signed)
Medication: gabapentin (NEURONTIN) 300 MG capsule  Has the patient contacted their pharmacy? Yes.     Preferred Pharmacy: [facility] [place], [address]  [place], [place] [address]  Phone:  [phone]  Fax:  [phone]

## 2021-01-21 NOTE — Telephone Encounter (Signed)
Medication sent yesterday 

## 2021-01-31 ENCOUNTER — Other Ambulatory Visit: Payer: Self-pay | Admitting: Family Medicine

## 2021-01-31 ENCOUNTER — Other Ambulatory Visit: Payer: Self-pay | Admitting: Adult Health

## 2021-01-31 DIAGNOSIS — F411 Generalized anxiety disorder: Secondary | ICD-10-CM

## 2021-02-03 NOTE — Telephone Encounter (Signed)
Needs and appointment. Last refill without one.

## 2021-03-11 ENCOUNTER — Other Ambulatory Visit: Payer: Self-pay | Admitting: Family Medicine

## 2021-03-16 ENCOUNTER — Other Ambulatory Visit: Payer: Self-pay

## 2021-03-16 ENCOUNTER — Inpatient Hospital Stay: Admission: RE | Admit: 2021-03-16 | Payer: 59 | Source: Ambulatory Visit

## 2021-03-16 DIAGNOSIS — F411 Generalized anxiety disorder: Secondary | ICD-10-CM

## 2021-03-16 NOTE — Telephone Encounter (Signed)
Left message for pt to call and schedule

## 2021-03-16 NOTE — Telephone Encounter (Signed)
Approve enough until an appt is made.

## 2021-03-16 NOTE — Telephone Encounter (Signed)
Please call to schedule an appt. Last seen 8/4 with RTC in 2 mo. Canceled 10/4 appt.

## 2021-04-02 ENCOUNTER — Ambulatory Visit: Payer: Self-pay | Admitting: Family Medicine

## 2021-05-16 ENCOUNTER — Other Ambulatory Visit: Payer: Self-pay | Admitting: Adult Health

## 2021-05-16 DIAGNOSIS — F411 Generalized anxiety disorder: Secondary | ICD-10-CM

## 2021-05-18 NOTE — Telephone Encounter (Signed)
Send enough medications to next appt only.

## 2021-05-19 ENCOUNTER — Telehealth: Payer: Self-pay | Admitting: Adult Health

## 2021-05-19 NOTE — Telephone Encounter (Signed)
Patient called in for refill on Xanax 0.'5mg'$  and Buspar '5mg'$ . PH: [place] ?

## 2021-05-19 NOTE — Telephone Encounter (Signed)
No refills until an appointment is made.

## 2021-05-20 NOTE — Telephone Encounter (Signed)
Patient has appt 5/2 ?

## 2021-05-20 NOTE — Telephone Encounter (Signed)
LVM to schedule follow up appt 

## 2021-06-02 ENCOUNTER — Ambulatory Visit (INDEPENDENT_AMBULATORY_CARE_PROVIDER_SITE_OTHER): Payer: Self-pay | Admitting: Adult Health

## 2021-06-02 ENCOUNTER — Encounter: Payer: Self-pay | Admitting: Adult Health

## 2021-06-02 DIAGNOSIS — F410 Panic disorder [episodic paroxysmal anxiety] without agoraphobia: Secondary | ICD-10-CM

## 2021-06-02 DIAGNOSIS — G4700 Insomnia, unspecified: Secondary | ICD-10-CM

## 2021-06-02 DIAGNOSIS — F411 Generalized anxiety disorder: Secondary | ICD-10-CM

## 2021-06-02 DIAGNOSIS — F331 Major depressive disorder, recurrent, moderate: Secondary | ICD-10-CM

## 2021-06-02 MED ORDER — VENLAFAXINE HCL ER 75 MG PO CP24: ORAL_CAPSULE | ORAL | 1 refills | Status: DC

## 2021-06-02 MED ORDER — BUSPIRONE HCL 5 MG PO TABS: ORAL_TABLET | ORAL | 5 refills | Status: DC

## 2021-06-02 MED ORDER — ALPRAZOLAM 1 MG PO TABS: ORAL_TABLET | ORAL | 2 refills | Status: DC

## 2021-06-02 NOTE — Progress Notes (Signed)
Lauren Hall ?[id number] ?11-Jul-1970 ?51 y.o. ? ?Subjective:  ? ?Patient ID:  Lauren Hall is a 51 y.o. (DOB 03/22/1970) female. ? ?Chief Complaint: No chief complaint on file. ? ? ?HPI ?Lauren Hall presents to the office today for follow-up of MDD, GAD, insomnia and panic attacks. ? ?Describes mood today as "ok". Pleasant. Deceased tearfulness. Mood symptoms - reports depression, anxiety and irritability. Denies panic attacks since starting Xanax. Stating "I'm not doing to good". Reports increased situational stressors - with step siblings - father's estate. Worried about her daughter - having mental health issues. Husband disabled - on dialysis. Not getting the help she needs around the house. Stating "I feel like I'm drowning at times". Varying interest and motivation. Taking medications as prescribed.  ?Energy levels improved. Active, does not have a regular exercise routine.  ?Enjoys some usual interests and activities. Married. Lives with husband of 20 years. Has one daughter - age 57.  ?Appetite adequate. Weight stable.  ?Sleeping better some nights than others. Averages 6 hours - staying up late. ?Focus and concentration difficulties. Completing tasks. Managing some aspects of household. Stay at home mother. ?Denies SI or HI.  ?Denies AH or VH. ? ?Previous medication trials: Pristiq, Wellbutrin, Prozac, Cymbalta, Lexapro, Zoloft,  ? ?Review of Systems:  ?Review of Systems  ?Musculoskeletal:  Negative for gait problem.  ?Neurological:  Negative for tremors.  ?Psychiatric/Behavioral:    ?     Please refer to HPI  ? ?Medications: I have reviewed the patient's current medications. ? ?Current Outpatient Medications  ?Medication Sig Dispense Refill  ? ALPRAZolam (XANAX) 0.5 MG tablet TAKE 2 TABLETS(1 MG) BY MOUTH TWICE DAILY 120 tablet 0  ? ARIPiprazole (ABILIFY) 5 MG tablet Take 1 tablet (5 mg total) by mouth daily. 30 tablet 5  ? atorvastatin (LIPITOR) 10 MG tablet Take 0.5 tablets (5 mg total) by  mouth daily. 15 tablet 5  ? busPIRone (BUSPAR) 5 MG tablet TAKE 1 TABLET(5 MG) BY MOUTH THREE TIMES DAILY 90 tablet 5  ? cyclobenzaprine (FLEXERIL) 10 MG tablet TAKE 1/2 TO 1 TABLET(5 TO 10 MG) BY MOUTH THREE TIMES DAILY AS NEEDED FOR MUSCLE SPASMS 90 tablet 0  ? diltiazem (CARDIZEM CD) 240 MG 24 hr capsule Take 1 capsule (240 mg total) by mouth daily. 90 capsule 3  ? gabapentin (NEURONTIN) 300 MG capsule TAKE 1 CAPSULE BY MOUTH TWICE A DAY AND 2 CAPSULES AT BEDTIME 360 capsule 1  ? levothyroxine (SYNTHROID) 25 MCG tablet TAKE 1 TABLET(25 MCG) BY MOUTH DAILY BEFORE BREAKFAST 30 tablet 5  ? lisinopril (ZESTRIL) 10 MG tablet Take 1 tablet (10 mg total) by mouth in the morning and at bedtime. 60 tablet 5  ? LOW-DOSE ASPIRIN PO     ? metoprolol succinate (TOPROL-XL) 25 MG 24 hr tablet Take 1 tablet (25 mg total) by mouth daily. 90 tablet 0  ? omeprazole (PRILOSEC) 40 MG capsule Take 1 capsule (40 mg total) by mouth daily. 90 capsule 0  ? ondansetron (ZOFRAN) 4 MG tablet Take 1 tablet (4 mg total) by mouth every 8 (eight) hours as needed for nausea or vomiting. 20 tablet 1  ? venlafaxine XR (EFFEXOR-XR) 75 MG 24 hr capsule TAKE 1 CAPSULE(75 MG) BY MOUTH AT BEDTIME 90 capsule 1  ? ?No current facility-administered medications for this visit.  ? ? ?Medication Side Effects: None ? ?Allergies:  ?Allergies  ?Allergen Reactions  ? Promethazine Anaphylaxis  ? ? ?Past Medical History:  ?Diagnosis Date  ? Allergy   ?  Anemia   ? Anxiety   ? 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 Bronchitis 07/27/2011  ? 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 Complication of anesthesia   ? patient woke up while they were sewing up her incision with breast lumpectomy  ? Depression   ? Dysrhythmia   ? Tachycardia  ? Enlarged thyroid   ? Fatigue 10/08/2010  ? Fibromyalgia 2009  ? GERD (gastroesophageal reflux disease) 09/26/2013  ? Headache(784.0) 10/08/2010  ? Migraines  ? Hip pain, bilateral 11/06/2010  ? Hyperglycemia 08/11/2015  ? normal Hgb A 1C per patient   ? Hypertension   ? Hypothyroidism 10/08/2010  ? Low back pain radiating to right leg 03/11/2015  ? Other and unspecified hyperlipidemia 07/29/2012  ? Peripheral neuropathy 11/11/2012  ? burning sensation mainly right leg  ? Poor concentration 01/11/2011  ? Preventative health care 07/29/2012  ? S/P laparoscopic hysterectomy 10/07/2015  ? Sinusitis, acute 07/27/2011  ? Tachycardia   ? Thyroid disease 10/08/2010  ? Tick bite of flank 11/09/2011  ? Tinea corporis 11/11/2012  ? Uterine leiomyoma   ? ? ?Past Medical History, Surgical history, Social history, and Family history were reviewed and updated as appropriate.  ? ?Please see review of systems for further details on the patient's review from today.  ? ?Objective:  ? ?Physical Exam:  ?LMP 09/02/2015 (Approximate) Comment: continous bleeding ? ?Physical Exam ?Constitutional:   ?   General: She is not in acute distress. ?Musculoskeletal:     ?   General: No deformity.  ?Neurological:  ?   Mental Status: She is alert and oriented to person, place, and time.  ?   Coordination: Coordination normal.  ?Psychiatric:     ?   Attention and Perception: Attention and perception normal. She does not perceive auditory or visual hallucinations.     ?   Mood and Affect: Mood normal. Mood is not anxious or depressed. Affect is not labile, blunt, angry or inappropriate.     ?   Speech: Speech normal.     ?   Behavior: Behavior normal.     ?   Thought Content: Thought content normal. Thought content is not paranoid or delusional. Thought content does not include homicidal or suicidal ideation. Thought content does not include homicidal or suicidal plan.     ?   Cognition and Memory: Cognition and memory normal.     ?   Judgment: Judgment normal.  ?   Comments: Insight intact  ? ? ?Lab Review:  ?   ?Component Value Date/Time  ? NA 138 09/19/2020 1632  ? K 4.2 09/19/2020 1632  ? CL 102 09/19/2020 1632  ? CO2 28 09/19/2020 1632  ? GLUCOSE 150 (H) 09/19/2020 1632  ? BUN 6 09/19/2020 1632  ?  CREATININE 0.80 09/19/2020 1632  ? CREATININE 0.79 09/24/2013 1544  ? CALCIUM 9.5 09/19/2020 1632  ? PROT 7.3 09/19/2020 1632  ? ALBUMIN 4.2 09/19/2020 1632  ? AST 39 (H) 09/19/2020 1632  ? ALT 64 (H) 09/19/2020 1632  ? ALKPHOS 164 (H) 09/19/2020 1632  ? BILITOT 0.3 09/19/2020 1632  ? GFRNONAA >60 01/17/2018 0128  ? GFRAA >60 01/17/2018 0128  ? ? ?   ?Component Value Date/Time  ? WBC 6.5 09/19/2020 1632  ? RBC 4.42 09/19/2020 1632  ? HGB 13.4 09/19/2020 1632  ? HCT 40.0 09/19/2020 1632  ? PLT 330.0 09/19/2020 1632  ? MCV 90.5 09/19/2020 1632  ? MCH 30.4 01/17/2018 0128  ? MCHC 33.5 09/19/2020 1632  ?  RDW 14.9 09/19/2020 1632  ? LYMPHSABS 2.7 03/07/2012 1556  ? MONOABS 0.6 03/07/2012 1556  ? EOSABS 0.1 03/07/2012 1556  ? BASOSABS 0.0 03/07/2012 1556  ? ? ?No results found for: POCLITH, LITHIUM  ? ?No results found for: PHENYTOIN, PHENOBARB, VALPROATE, CBMZ  ? ?.res ?Assessment: Plan:   ? ?Plan: ? ?PDMP reviewed ? ?1. Buspar '10mg'$  TID ?2. Effexor XR '75mg'$  daily   ?3. Increase Xanax 0.'5mg'$  to '1mg'$  BID ? ?Therapist - Marya Fossa ? ?RTC 4 weeks ? ?Patient advised to contact office with any questions, adverse effects, or acute worsening in signs and symptoms. ? ?Discussed potential benefits, risk, and side effects of benzodiazepines to include potential risk of tolerance and dependence, as well as possible drowsiness.  Advised patient not to drive if experiencing drowsiness and to take lowest possible effective dose to minimize risk of dependence and tolerance. ? ?Discussed potential metabolic side effects associated with atypical antipsychotics, as well as potential risk for movement side effects. Advised pt to contact office if movement side effects occur.  ? ?There are no diagnoses linked to this encounter.  ? ?Please see After Visit Summary for patient specific instructions. ? ?Future Appointments  ?Date Time Provider [place]  ?07/14/2021  2:00 PM Mosie Lukes, MD [id number]  ? ? ?No orders of the defined  types were placed in this encounter. ? ? ?------------------------------- ?

## 2021-06-19 ENCOUNTER — Other Ambulatory Visit: Payer: Self-pay

## 2021-06-19 MED ORDER — LISINOPRIL 10 MG PO TABS: 10.0000 mg | ORAL_TABLET | Freq: Two times a day (BID) | ORAL | 3 refills | Status: DC

## 2021-07-09 ENCOUNTER — Ambulatory Visit: Payer: Self-pay | Admitting: Adult Health

## 2021-07-13 NOTE — Progress Notes (Unsigned)
Subjective:    Patient ID: Lauren Hall, female    DOB: October 02, 1970, 51 y.o.   MRN: [id number]  No chief complaint on file.   HPI Patient is in today for a follow up.  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   Bronchitis 07/27/2011   Chiari malformation 04/06/7015   Complication of anesthesia    patient woke up while they were sewing up her incision with breast lumpectomy   Depression    Dysrhythmia    Tachycardia   Enlarged thyroid    Fatigue 10/08/2010   Fibromyalgia 2009   GERD (gastroesophageal reflux disease) 09/26/2013   Headache(784.0) 10/08/2010   Migraines   Hip pain, bilateral 11/06/2010   Hyperglycemia 08/11/2015   normal Hgb A 1C per patient   Hypertension    Hypothyroidism 10/08/2010   Low back pain radiating to right leg 03/11/2015   Other and unspecified hyperlipidemia 07/29/2012   Peripheral neuropathy 11/11/2012   burning sensation mainly right leg   Poor concentration 01/11/2011   Preventative health care 07/29/2012   S/P laparoscopic hysterectomy 10/07/2015   Sinusitis, acute 07/27/2011   Tachycardia    Thyroid disease 10/08/2010   Tick bite of flank 11/09/2011   Tinea corporis 11/11/2012   Uterine leiomyoma     Past Surgical History:  Procedure Laterality Date   BREAST LUMPECTOMY Right 1999   benign    CYSTOSCOPY N/A 10/07/2015   Procedure: CYSTOSCOPY;  Surgeon: Sanjuana Kava, MD;  Location: [facility];  Service: Gynecology;  Laterality: N/A;    Family History  Problem Relation Age of Onset   Fibromyalgia Mother    Diabetes Mother        type 2   Hypertension Mother    Hyperlipidemia Mother    Arthritis Mother    Alcohol abuse Father    Colon cancer Father    Hypertension Brother    Alcohol abuse Brother    Heart disease Maternal Grandmother    Hyperlipidemia Maternal Grandmother    Diabetes Maternal Grandmother        type 2   Colon cancer Maternal Grandmother    Stroke  Maternal Grandfather    Diabetes Maternal Grandfather        type 2   Ovarian cancer Paternal Grandmother    Alzheimer's disease Paternal Grandfather     Social History   Socioeconomic History   Marital status: Married    Spouse name: Derrick   Number of children: Not on file   Years of education: Not on file   Highest education level: Not on file  Occupational History   Not on file  Tobacco Use   Smoking status: Never   Smokeless tobacco: Never  Substance and Sexual Activity   Alcohol use: Yes    Comment: special occassion- very rarely   Drug use: No   Sexual activity: Yes    Partners: Male    Birth control/protection: None  Other Topics Concern   Not on file  Social History Narrative   Not on file   Social Determinants of Health   Financial Resource Strain: Not on file  Food Insecurity: Not on file  Transportation Needs: Not on file  Physical Activity: Not on file  Stress: Not on file  Social Connections: Not on file  Intimate Partner Violence: Not on file    Outpatient Medications Prior to Visit  Medication Sig Dispense Refill  ALPRAZolam (XANAX) 1 MG tablet Take one tablet twice daily. 60 tablet 2   atorvastatin (LIPITOR) 10 MG tablet Take 0.5 tablets (5 mg total) by mouth daily. 15 tablet 5   busPIRone (BUSPAR) 5 MG tablet TAKE 1 TABLET(5 MG) BY MOUTH THREE TIMES DAILY 90 tablet 5   cyclobenzaprine (FLEXERIL) 10 MG tablet TAKE 1/2 TO 1 TABLET(5 TO 10 MG) BY MOUTH THREE TIMES DAILY AS NEEDED FOR MUSCLE SPASMS 90 tablet 0   diltiazem (CARDIZEM CD) 240 MG 24 hr capsule Take 1 capsule (240 mg total) by mouth daily. 90 capsule 3   gabapentin (NEURONTIN) 300 MG capsule TAKE 1 CAPSULE BY MOUTH TWICE A DAY AND 2 CAPSULES AT BEDTIME 360 capsule 1   levothyroxine (SYNTHROID) 25 MCG tablet TAKE 1 TABLET(25 MCG) BY MOUTH DAILY BEFORE BREAKFAST 30 tablet 5   lisinopril (ZESTRIL) 10 MG tablet Take 1 tablet (10 mg total) by mouth in the morning and at bedtime. 60 tablet 3    LOW-DOSE ASPIRIN PO      metoprolol succinate (TOPROL-XL) 25 MG 24 hr tablet Take 1 tablet (25 mg total) by mouth daily. 90 tablet 0   omeprazole (PRILOSEC) 40 MG capsule Take 1 capsule (40 mg total) by mouth daily. 90 capsule 0   ondansetron (ZOFRAN) 4 MG tablet Take 1 tablet (4 mg total) by mouth every 8 (eight) hours as needed for nausea or vomiting. 20 tablet 1   venlafaxine XR (EFFEXOR-XR) 75 MG 24 hr capsule TAKE 1 CAPSULE(75 MG) BY MOUTH AT BEDTIME 90 capsule 1   No facility-administered medications prior to visit.    Allergies  Allergen Reactions   Promethazine Anaphylaxis   Promethazine Hcl Anaphylaxis    ROS     Objective:    Physical Exam  LMP 09/02/2015 (Approximate) Comment: continous bleeding Wt Readings from Last 3 Encounters:  11/13/20 183 lb (83 kg)  06/27/19 163 lb (73.9 kg)  06/08/19 157 lb (71.2 kg)    Diabetic Foot Exam - Simple   No data filed    Lab Results  Component Value Date   WBC 6.5 09/19/2020   HGB 13.4 09/19/2020   HCT 40.0 09/19/2020   PLT 330.0 09/19/2020   GLUCOSE 150 (H) 09/19/2020   CHOL 222 (H) 09/19/2020   TRIG 247.0 (H) 09/19/2020   HDL 50.00 09/19/2020   LDLDIRECT 146.0 09/19/2020   LDLCALC 103 (H) 05/08/2019   ALT 64 (H) 09/19/2020   AST 39 (H) 09/19/2020   NA 138 09/19/2020   K 4.2 09/19/2020   CL 102 09/19/2020   CREATININE 0.80 09/19/2020   BUN 6 09/19/2020   CO2 28 09/19/2020   TSH 0.76 09/19/2020   HGBA1C 6.2 09/19/2020    Lab Results  Component Value Date   TSH 0.76 09/19/2020   Lab Results  Component Value Date   WBC 6.5 09/19/2020   HGB 13.4 09/19/2020   HCT 40.0 09/19/2020   MCV 90.5 09/19/2020   PLT 330.0 09/19/2020   Lab Results  Component Value Date   NA 138 09/19/2020   K 4.2 09/19/2020   CO2 28 09/19/2020   GLUCOSE 150 (H) 09/19/2020   BUN 6 09/19/2020   CREATININE 0.80 09/19/2020   BILITOT 0.3 09/19/2020   ALKPHOS 164 (H) 09/19/2020   AST 39 (H) 09/19/2020   ALT 64 (H) 09/19/2020    PROT 7.3 09/19/2020   ALBUMIN 4.2 09/19/2020   CALCIUM 9.5 09/19/2020   ANIONGAP 12 01/17/2018   GFR 85.07 09/19/2020  Lab Results  Component Value Date   CHOL 222 (H) 09/19/2020   Lab Results  Component Value Date   HDL 50.00 09/19/2020   Lab Results  Component Value Date   LDLCALC 103 (H) 05/08/2019   Lab Results  Component Value Date   TRIG 247.0 (H) 09/19/2020   Lab Results  Component Value Date   CHOLHDL 4 09/19/2020   Lab Results  Component Value Date   HGBA1C 6.2 09/19/2020       Assessment & Plan:      Problem List Items Addressed This Visit   None   I am having Lauren Hall maintain her ondansetron, LOW-DOSE ASPIRIN PO, diltiazem, omeprazole, metoprolol succinate, atorvastatin, levothyroxine, gabapentin, cyclobenzaprine, busPIRone, ALPRAZolam, venlafaxine XR, and lisinopril.  No orders of the defined types were placed in this encounter.

## 2021-07-14 ENCOUNTER — Encounter: Payer: Self-pay | Admitting: Family Medicine

## 2021-07-14 ENCOUNTER — Other Ambulatory Visit: Payer: Self-pay | Admitting: Family Medicine

## 2021-07-14 ENCOUNTER — Ambulatory Visit (INDEPENDENT_AMBULATORY_CARE_PROVIDER_SITE_OTHER): Payer: Self-pay | Admitting: Family Medicine

## 2021-07-14 VITALS — BP 138/88 | HR 83 | Resp 20 | Ht 59.5 in | Wt 169.2 lb

## 2021-07-14 DIAGNOSIS — R748 Abnormal levels of other serum enzymes: Secondary | ICD-10-CM

## 2021-07-14 DIAGNOSIS — F32A Depression, unspecified: Secondary | ICD-10-CM

## 2021-07-14 DIAGNOSIS — I10 Essential (primary) hypertension: Secondary | ICD-10-CM

## 2021-07-14 DIAGNOSIS — E079 Disorder of thyroid, unspecified: Secondary | ICD-10-CM

## 2021-07-14 DIAGNOSIS — E039 Hypothyroidism, unspecified: Secondary | ICD-10-CM

## 2021-07-14 DIAGNOSIS — E782 Mixed hyperlipidemia: Secondary | ICD-10-CM

## 2021-07-14 DIAGNOSIS — R739 Hyperglycemia, unspecified: Secondary | ICD-10-CM

## 2021-07-14 MED ORDER — AMOXICILLIN-POT CLAVULANATE 875-125 MG PO TABS: 1.0000 | ORAL_TABLET | Freq: Two times a day (BID) | ORAL | 0 refills | Status: DC

## 2021-07-14 MED ORDER — GABAPENTIN 300 MG PO CAPS: ORAL_CAPSULE | ORAL | 1 refills | Status: DC

## 2021-07-14 NOTE — Patient Instructions (Signed)
Consider comparing prices of Abilify vs Zyprexa vs Seroquel to manage depressionSinus Pain  Sinus pain may occur when your sinuses become clogged or swollen. Sinuses are air-filled spaces in your skull that are behind the bones of your face and forehead. Sinus pain can range from mild to severe. What are the causes? Sinus pain can result from various conditions that affect the sinuses. Common causes include: Colds. Sinus infections. Allergies. What are the signs or symptoms? The main symptom of this condition is pain or pressure in your face, forehead, ears, or upper teeth. People who have sinus pain often have other symptoms, such as: Congested or runny nose. Fever. Inability to smell. Headache. Weather changes can make symptoms worse. How is this diagnosed? Your health care provider will diagnose this condition based on your symptoms and a physical exam. If you have pain that keeps coming back or does not go away, your health care provider may recommend more testing. This may include: Imaging tests, such as a CT scan or MRI, to check for problems with your sinuses. Examination of your sinuses using a thin tool with a camera that is inserted through your nose (endoscopy). How is this treated? Treatment for this condition depends on the cause. Sinus pain that is caused by a sinus infection may be treated with antibiotic medicine. Sinus pain that is caused by congestion may be helped by rinsing out (flushing) the nose and sinuses with saline solution. Sinus pain that is caused by allergies may be helped by allergy medicines (antihistamines) and medicated nasal sprays. Sinus surgery may be needed in some cases if other treatments do not help. Follow these instructions at home: General instructions If directed: Apply a warm, moist washcloth to your face to help relieve pain. Use a nasal saline wash. Follow the directions on the bottle or box. Hydrate and humidify Drink enough water to  keep your urine clear or pale yellow. Staying hydrated will help to thin your mucus. Use a humidifier if your home is dry. Inhale steam for 10-15 minutes, 3-4 times a day or as told by your health care provider. You can do this in the bathroom while a hot shower is running. Limit your exposure to cool or dry air. Medicines  Take over-the-counter and prescription medicines only as told by your health care provider. If you were prescribed an antibiotic medicine, take it as told by your health care provider. Do not stop taking the antibiotic even if you start to feel better. If you have congestion, use a nasal spray to help lessen pressure. Contact a health care provider if: You have sinus pain more than one time a week. You have sensitivity to light or sound. You develop a fever. You feel nauseous or you vomit. Your sinus pain or headache does not get better with treatment. Get help right away if: You have vision problems. You have sudden, severe pain in your face or head. You have a seizure. You are confused. You have a stiff neck. Summary Sinus pain occurs when your sinuses become clogged or swollen. Sinus pain can result from various conditions that affect the sinuses, such as a cold, a sinus infection, or an allergy. Treatment for this condition depends on the cause. It may include medicine, such as antibiotics or antihistamines. This information is not intended to replace advice given to you by your health care provider. Make sure you discuss any questions you have with your health care provider. Document Revised: 12/21/2020 Document Reviewed: 12/21/2020 Elsevier Patient  Education  Lauren Hall.

## 2021-07-15 DIAGNOSIS — R748 Abnormal levels of other serum enzymes: Secondary | ICD-10-CM | POA: Insufficient documentation

## 2021-07-15 LAB — COMPREHENSIVE METABOLIC PANEL
ALT: 34 U/L (ref 0–35)
AST: 23 U/L (ref 0–37)
Albumin: 4.6 g/dL (ref 3.5–5.2)
Alkaline Phosphatase: 144 U/L — ABNORMAL HIGH (ref 39–117)
BUN: 6 mg/dL (ref 6–23)
CO2: 31 mEq/L (ref 19–32)
Calcium: 9.8 mg/dL (ref 8.4–10.5)
Chloride: 101 mEq/L (ref 96–112)
Creatinine, Ser: 0.76 mg/dL (ref 0.40–1.20)
GFR: 89.95 mL/min (ref >60.00)
Glucose, Bld: 111 mg/dL — ABNORMAL HIGH (ref 70–99)
Potassium: 4.3 mEq/L (ref 3.5–5.1)
Sodium: 139 mEq/L (ref 135–145)
Total Bilirubin: 0.4 mg/dL (ref 0.2–1.2)
Total Protein: 7.6 g/dL (ref 6.0–8.3)

## 2021-07-15 LAB — HEMOGLOBIN A1C: Hgb A1c MFr Bld: 6.1 % (ref 4.6–6.5)

## 2021-07-15 NOTE — Assessment & Plan Note (Signed)
hgba1c acceptable, minimize simple carbs. Increase exercise as tolerated.  

## 2021-07-15 NOTE — Assessment & Plan Note (Signed)
Well controlled, no changes to meds. Encouraged heart healthy diet such as the DASH diet and exercise as tolerated.  °

## 2021-07-15 NOTE — Assessment & Plan Note (Signed)
Continues to struggle with daily pain and stress. She is having trouble with extended family, her husband and daughter's health conditions. She is managing but it can be a struggle. No changes today

## 2021-07-15 NOTE — Assessment & Plan Note (Signed)
Resolved with recheck, continue to avoid simple carbs and attempt modest weight. Will monitor

## 2021-07-15 NOTE — Assessment & Plan Note (Signed)
On Levothyroxine, continue to monitor 

## 2021-07-28 ENCOUNTER — Ambulatory Visit: Payer: Self-pay | Admitting: Adult Health

## 2021-08-25 ENCOUNTER — Encounter: Payer: Self-pay | Admitting: Adult Health

## 2021-08-25 ENCOUNTER — Telehealth (INDEPENDENT_AMBULATORY_CARE_PROVIDER_SITE_OTHER): Payer: Self-pay | Admitting: Adult Health

## 2021-08-25 DIAGNOSIS — F411 Generalized anxiety disorder: Secondary | ICD-10-CM

## 2021-08-25 DIAGNOSIS — G4700 Insomnia, unspecified: Secondary | ICD-10-CM

## 2021-08-25 DIAGNOSIS — F410 Panic disorder [episodic paroxysmal anxiety] without agoraphobia: Secondary | ICD-10-CM

## 2021-08-25 DIAGNOSIS — F331 Major depressive disorder, recurrent, moderate: Secondary | ICD-10-CM

## 2021-08-25 MED ORDER — BUSPIRONE HCL 5 MG PO TABS: ORAL_TABLET | ORAL | 5 refills | Status: DC

## 2021-08-25 MED ORDER — ALPRAZOLAM 1 MG PO TABS: ORAL_TABLET | ORAL | 2 refills | Status: DC

## 2021-08-25 MED ORDER — VENLAFAXINE HCL ER 150 MG PO CP24: ORAL_CAPSULE | ORAL | 5 refills | Status: DC

## 2021-08-25 NOTE — Progress Notes (Signed)
Lauren Hall [id number] 12/24/1970 51 y.o.  Virtual Visit via Video Note  I connected with pt @ on 08/25/21 at  2:40 PM EDT by a video enabled telemedicine application and verified that I am speaking with the correct person using two identifiers.   I discussed the limitations of evaluation and management by telemedicine and the availability of in person appointments. The patient expressed understanding and agreed to proceed.  I discussed the assessment and treatment plan with the patient. The patient was provided an opportunity to ask questions and all were answered. The patient agreed with the plan and demonstrated an understanding of the instructions.   The patient was advised to call back or seek an in-person evaluation if the symptoms worsen or if the condition fails to improve as anticipated.  I provided 25 minutes of non-face-to-face time during this encounter.  The patient was located at home.  The provider was located at [place].   Aloha Gell, NP   Subjective:   Patient ID:  Lauren Hall is a 51 y.o. (DOB [birth year]) female.  Chief Complaint: No chief complaint on file.   HPI Lauren Hall presents for follow-up of MDD, GAD and insomnia and panic attacks.  Describes mood today as "ok". Pleasant.Tearful at times. Mood symptoms - reports depression, anxiety and irritability. More depressed overall. Decreased panic attacks. Stating "I'm trying to hang in there". Feels like she may need a medication adjustment. Willing to increase Effexor for '75mg'$  to '150mg'$  daily. Reports ongoing situational stressors - daughter with mental health issues, husband disabled, and issues with step siblings settling father's estate. Has been able to reconnect with therapist and had a recent session with her. Varying interest and motivation. Taking medications as prescribed.  Energy levels vary. Active, does not have a regular exercise routine.  Enjoys some usual interests  and activities. Married. Lives with husband of 20 years. Has one daughter - age 46.  Appetite adequate. Weight stable.  Sleeping better some nights than others. Averages 6 hours. Focus and concentration difficulties. Completing tasks. Managing some aspects of household. 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  Denies AH or VH.  Previous medication trials: Pristiq, Wellbutrin, Prozac, Cymbalta, Lexapro, Zoloft,    Review of Systems:  Review of Systems  Musculoskeletal:  Negative for gait problem.  Neurological:  Negative for tremors.  Psychiatric/Behavioral:         Please refer to HPI    Medications: I have reviewed the patient's current medications.  Current Outpatient Medications  Medication Sig Dispense Refill   ALPRAZolam (XANAX) 1 MG tablet Take one tablet twice daily. 60 tablet 2   amoxicillin-clavulanate (AUGMENTIN) 875-125 MG tablet Take 1 tablet by mouth 2 (two) times daily. 20 tablet 0   atorvastatin (LIPITOR) 10 MG tablet Take 0.5 tablets (5 mg total) by mouth daily. 15 tablet 5   busPIRone (BUSPAR) 5 MG tablet TAKE 1 TABLET(5 MG) BY MOUTH THREE TIMES DAILY 90 tablet 5   cyclobenzaprine (FLEXERIL) 10 MG tablet TAKE 1/2 TO 1 TABLET(5 TO 10 MG) BY MOUTH THREE TIMES DAILY AS NEEDED FOR MUSCLE SPASMS 90 tablet 0   gabapentin (NEURONTIN) 300 MG capsule TAKE 1 CAPSULE BY MOUTH TWICE A DAY AND 2 CAPSULES AT BEDTIME 360 capsule 1   levothyroxine (SYNTHROID) 25 MCG tablet TAKE 1 TABLET(25 MCG) BY MOUTH DAILY BEFORE BREAKFAST 30 tablet 5   lisinopril (ZESTRIL) 10 MG tablet Take 1 tablet (10 mg total) by mouth in the morning and  at bedtime. 60 tablet 3   LOW-DOSE ASPIRIN PO      omeprazole (PRILOSEC) 40 MG capsule Take 1 capsule (40 mg total) by mouth daily. 90 capsule 0   venlafaxine XR (EFFEXOR-XR) 75 MG 24 hr capsule TAKE 1 CAPSULE(75 MG) BY MOUTH AT BEDTIME 90 capsule 1   No current facility-administered medications for this visit.    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   Bronchitis 07/27/2011   Chiari malformation 02/04/863   Complication of anesthesia    patient woke up while they were sewing up her incision with breast lumpectomy   Depression    Dysrhythmia    Tachycardia   Enlarged thyroid    Fatigue 10/08/2010   Fibromyalgia 2009   GERD (gastroesophageal reflux disease) 09/26/2013   Headache(784.0) 10/08/2010   Migraines   Hip pain, bilateral 11/06/2010   Hyperglycemia 08/11/2015   normal Hgb A 1C per patient   Hypertension    Hypothyroidism 10/08/2010   Low back pain radiating to right leg 03/11/2015   Other and unspecified hyperlipidemia 07/29/2012   Peripheral neuropathy 11/11/2012   burning sensation mainly right leg   Poor concentration 01/11/2011   Preventative health care 07/29/2012   S/P laparoscopic hysterectomy 10/07/2015   Sinusitis, acute 07/27/2011   Tachycardia    Thyroid disease 10/08/2010   Tick bite of flank 11/09/2011   Tinea corporis 11/11/2012   Uterine leiomyoma     Family History  Problem Relation Age of Onset   Fibromyalgia Mother    Diabetes Mother        type 2   Hypertension Mother    Hyperlipidemia Mother    Arthritis Mother    Alcohol abuse Father    Colon cancer Father    Hypertension Brother    Alcohol abuse Brother    Heart disease Maternal Grandmother    Hyperlipidemia Maternal Grandmother    Diabetes Maternal Grandmother        type 2   Colon cancer Maternal Grandmother    Stroke Maternal Grandfather    Diabetes Maternal Grandfather        type 2   Ovarian cancer Paternal Grandmother    Alzheimer's disease Paternal Grandfather     Social History   Socioeconomic History   Marital status: Married    Spouse name: Derrick   Number of children: Not on file   Years of education: Not on file    Highest education level: Not on file  Occupational History   Not on file  Tobacco Use   Smoking status: Never   Smokeless tobacco: Never  Substance and Sexual Activity   Alcohol use: Yes    Comment: special occassion- very rarely   Drug use: No   Sexual activity: Yes    Partners: Male    Birth control/protection: None  Other Topics Concern   Not on file  Social History Narrative   Not on file   Social Determinants of Health   Financial Resource Strain: Not on file  Food Insecurity: Not on file  Transportation Needs: Not on file  Physical Activity: Not on file  Stress: Not on file  Social Connections: Not on file  Intimate Partner Violence: Not on file    Past Medical History, Surgical history,  Social history, and Family history were reviewed and updated as appropriate.   Please see review of systems for further details on the patient's review from today.   Objective:   Physical Exam:  LMP 09/02/2015 (Approximate) Comment: continous bleeding  Physical Exam Constitutional:      General: She is not in acute distress. Musculoskeletal:        General: No deformity.  Neurological:     Mental Status: She is alert and oriented to person, place, and time.     Coordination: Coordination normal.  Psychiatric:        Attention and Perception: Attention and perception normal. She does not perceive auditory or visual hallucinations.        Mood and Affect: Mood normal. Mood is not anxious or depressed. Affect is not labile, blunt, angry or inappropriate.        Speech: Speech normal.        Behavior: Behavior normal.        Thought Content: Thought content normal. Thought content is not paranoid or delusional. Thought content does not include homicidal or suicidal ideation. Thought content does not include homicidal or suicidal plan.        Cognition and Memory: Cognition and memory normal.        Judgment: Judgment normal.     Comments: Insight intact     Lab Review:      Component Value Date/Time   NA 139 07/14/2021 1503   K 4.3 07/14/2021 1503   CL 101 07/14/2021 1503   CO2 31 07/14/2021 1503   GLUCOSE 111 (H) 07/14/2021 1503   BUN 6 07/14/2021 1503   CREATININE 0.76 07/14/2021 1503   CREATININE 0.79 09/24/2013 1544   CALCIUM 9.8 07/14/2021 1503   PROT 7.6 07/14/2021 1503   ALBUMIN 4.6 07/14/2021 1503   AST 23 07/14/2021 1503   ALT 34 07/14/2021 1503   ALKPHOS 144 (H) 07/14/2021 1503   BILITOT 0.4 07/14/2021 1503   GFRNONAA >60 01/17/2018 0128   GFRAA >60 01/17/2018 0128       Component Value Date/Time   WBC 6.5 09/19/2020 1632   RBC 4.42 09/19/2020 1632   HGB 13.4 09/19/2020 1632   HCT 40.0 09/19/2020 1632   PLT 330.0 09/19/2020 1632   MCV 90.5 09/19/2020 1632   MCH 30.4 01/17/2018 0128   MCHC 33.5 09/19/2020 1632   RDW 14.9 09/19/2020 1632   LYMPHSABS 2.7 03/07/2012 1556   MONOABS 0.6 03/07/2012 1556   EOSABS 0.1 03/07/2012 1556   BASOSABS 0.0 03/07/2012 1556    No results found for: "POCLITH", "LITHIUM"   No results found for: "PHENYTOIN", "PHENOBARB", "VALPROATE", "CBMZ"   .res Assessment: Plan:    Plan:  PDMP reviewed  1. Buspar '5mg'$  TID 2. Effexor XR '75mg'$  to '150mg'$  daily   3. Xanax '1mg'$  BID  Therapist - Marya Fossa  RTC 3 months  Patient advised to contact office with any questions, adverse effects, or acute worsening in signs and symptoms.  Discussed potential benefits, risk, and side effects of benzodiazepines to include potential risk of tolerance and dependence, as well as possible drowsiness.  Advised patient not to drive if experiencing drowsiness and to take lowest possible effective dose to minimize risk of dependence and tolerance.  Discussed potential metabolic side effects associated with atypical antipsychotics, as well as potential risk for movement side effects. Advised pt to contact office if movement side effects occur.   There are no diagnoses linked to this encounter.   Please  see After Visit  Summary for patient specific instructions.  Future Appointments  Date Time Provider [place]  01/21/2022  9:40 AM Mosie Lukes, MD [id number]    No orders of the defined types were placed in this encounter.     -------------------------------

## 2021-10-05 ENCOUNTER — Encounter: Payer: Self-pay | Admitting: Family Medicine

## 2021-10-06 ENCOUNTER — Other Ambulatory Visit: Payer: Self-pay | Admitting: Family Medicine

## 2021-10-06 MED ORDER — ONDANSETRON HCL 4 MG PO TABS: 4.0000 mg | ORAL_TABLET | Freq: Three times a day (TID) | ORAL | 1 refills | Status: DC | PRN

## 2021-10-14 ENCOUNTER — Other Ambulatory Visit: Payer: Self-pay

## 2021-10-14 MED ORDER — LISINOPRIL 10 MG PO TABS: 10.0000 mg | ORAL_TABLET | Freq: Two times a day (BID) | ORAL | 3 refills | Status: DC

## 2021-11-24 ENCOUNTER — Encounter: Payer: Self-pay | Admitting: Adult Health

## 2021-11-24 ENCOUNTER — Ambulatory Visit (INDEPENDENT_AMBULATORY_CARE_PROVIDER_SITE_OTHER): Payer: Self-pay | Admitting: Adult Health

## 2021-11-24 DIAGNOSIS — F331 Major depressive disorder, recurrent, moderate: Secondary | ICD-10-CM

## 2021-11-24 DIAGNOSIS — F411 Generalized anxiety disorder: Secondary | ICD-10-CM

## 2021-11-24 DIAGNOSIS — F410 Panic disorder [episodic paroxysmal anxiety] without agoraphobia: Secondary | ICD-10-CM

## 2021-11-24 DIAGNOSIS — G4700 Insomnia, unspecified: Secondary | ICD-10-CM

## 2021-11-24 MED ORDER — BUSPIRONE HCL 5 MG PO TABS: ORAL_TABLET | ORAL | 5 refills | Status: DC

## 2021-11-24 MED ORDER — VENLAFAXINE HCL ER 150 MG PO CP24: ORAL_CAPSULE | ORAL | 3 refills | Status: DC

## 2021-11-24 MED ORDER — ALPRAZOLAM 1 MG PO TABS: ORAL_TABLET | ORAL | 2 refills | Status: DC

## 2021-11-24 NOTE — Progress Notes (Signed)
Lauren Hall [id number] January 25, 1971 51 y.o.  Subjective:   Patient ID:  Lauren Hall is a 51 y.o. (DOB 1971-02-01) female.  Chief Complaint: No chief complaint on file.   HPI Lauren Hall presents to the office today for follow-up of MDD, GAD and insomnia and panic attacks.  Describes mood today as "ok". Pleasant.Tearful at times. Mood symptoms - reports some depression, anxiety and irritability. Decreased panic attacks. Mood is variable. Stating "I think I'm doing ok - I have my days and moments".   Reports ongoing situational stressors - grieving loss of mother, daughter with mental health issues, husband disabled, and issues with step siblings settling father's estate.  Working with a Transport planner. Varying interest and motivation. Taking medications as prescribed.  Energy levels very low. Active, does not have a regular exercise routine with physical disabilities.  Enjoys some usual interests and activities. Married. Lives with husband of 20 years (dialysis). Has one daughter - age 17.  Appetite adequate. Weight gain - 169 pounds. Sleeping better some nights than others. Averages 6 to 7 hours. Focus and concentration difficulties - "fibro fog". Has started cleaning out her mother's house. Completing tasks. Managing some aspects of household. 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  Denies AH or VH.  Previous medication trials: Pristiq, Wellbutrin, Prozac, Cymbalta, Lexapro, Zoloft,     GAD-7    Flowsheet Row Office Visit from 07/14/2021 in [facility] at [facility]  Total GAD-7 Score 15      PHQ2-9    [place] Office Visit from 07/14/2021 in [facility] at [facility]  PHQ-2 Total Score 6  PHQ-9 Total Score 17        Review of Systems:  Review of Systems  Musculoskeletal:  Negative for gait problem.  Neurological:  Negative for tremors.  Psychiatric/Behavioral:         Please refer to HPI     Medications: I have reviewed the patient's current medications.  Current Outpatient Medications  Medication Sig Dispense Refill   ALPRAZolam (XANAX) 1 MG tablet Take one tablet twice daily. 60 tablet 2   amoxicillin-clavulanate (AUGMENTIN) 875-125 MG tablet Take 1 tablet by mouth 2 (two) times daily. 20 tablet 0   atorvastatin (LIPITOR) 10 MG tablet Take 0.5 tablets (5 mg total) by mouth daily. 15 tablet 5   busPIRone (BUSPAR) 5 MG tablet TAKE 1 TABLET(5 MG) BY MOUTH THREE TIMES DAILY 90 tablet 5   cyclobenzaprine (FLEXERIL) 10 MG tablet TAKE 1/2 TO 1 TABLET(5 TO 10 MG) BY MOUTH THREE TIMES DAILY AS NEEDED FOR MUSCLE SPASMS 90 tablet 0   gabapentin (NEURONTIN) 300 MG capsule TAKE 1 CAPSULE BY MOUTH TWICE A DAY AND 2 CAPSULES AT BEDTIME 360 capsule 1   levothyroxine (SYNTHROID) 25 MCG tablet TAKE 1 TABLET(25 MCG) BY MOUTH DAILY BEFORE BREAKFAST 30 tablet 5   lisinopril (ZESTRIL) 10 MG tablet Take 1 tablet (10 mg total) by mouth in the morning and at bedtime. 60 tablet 3   LOW-DOSE ASPIRIN PO      omeprazole (PRILOSEC) 40 MG capsule Take 1 capsule (40 mg total) by mouth daily. 90 capsule 0   ondansetron (ZOFRAN) 4 MG tablet Take 1 tablet (4 mg total) by mouth every 8 (eight) hours as needed for nausea or vomiting. 30 tablet 1   venlafaxine XR (EFFEXOR-XR) 150 MG 24 hr capsule Take one capsule every morning. 90 capsule 3   No current facility-administered medications for this  visit.    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   Complication of anesthesia    patient woke up while they were sewing up her incision with breast lumpectomy   Depression    Dysrhythmia    Tachycardia   Enlarged thyroid    Fatigue 10/08/2010    Fibromyalgia 2009   GERD (gastroesophageal reflux disease) 09/26/2013   Headache(784.0) 10/08/2010   Migraines   Hip pain, bilateral 11/06/2010   Hyperglycemia 08/11/2015   normal Hgb A 1C per patient   Hypertension    Hypothyroidism 10/08/2010   Low back pain radiating to right leg 03/11/2015   Other and unspecified hyperlipidemia 07/29/2012   Peripheral neuropathy 11/11/2012   burning sensation mainly right leg   Poor concentration 01/11/2011   Preventative health care 07/29/2012   S/P laparoscopic hysterectomy 10/07/2015   Sinusitis, acute 07/27/2011   Tachycardia    Thyroid disease 10/08/2010   Tick bite of flank 11/09/2011   Tinea corporis 11/11/2012   Uterine leiomyoma     Past Medical History, Surgical history, Social history, and Family history were reviewed and updated as appropriate.   Please see review of systems for further details on the patient's review from today.   Objective:   Physical Exam:  LMP 09/02/2015 (Approximate) Comment: continous bleeding  Physical Exam Constitutional:      General: She is not in acute distress. Musculoskeletal:        General: No deformity.  Neurological:     Mental Status: She is alert and oriented to person, place, and time.     Coordination: Coordination normal.  Psychiatric:        Attention and Perception: Attention and perception normal. She does not perceive auditory or visual hallucinations.        Mood and Affect: Mood normal. Mood is not anxious or depressed. Affect is not labile, blunt, angry or inappropriate.        Speech: Speech normal.        Behavior: Behavior normal.        Thought Content: Thought content normal. Thought content is not paranoid or delusional. Thought content does not include homicidal or suicidal ideation. Thought content does not include homicidal or suicidal plan.        Cognition and Memory: Cognition and memory normal.        Judgment: Judgment normal.     Comments: Insight intact     Lab Review:      Component Value Date/Time   NA 139 07/14/2021 1503   K 4.3 07/14/2021 1503   CL 101 07/14/2021 1503   CO2 31 07/14/2021 1503   GLUCOSE 111 (H) 07/14/2021 1503   BUN 6 07/14/2021 1503   CREATININE 0.76 07/14/2021 1503   CREATININE 0.79 09/24/2013 1544   CALCIUM 9.8 07/14/2021 1503   PROT 7.6 07/14/2021 1503   ALBUMIN 4.6 07/14/2021 1503   AST 23 07/14/2021 1503   ALT 34 07/14/2021 1503   ALKPHOS 144 (H) 07/14/2021 1503   BILITOT 0.4 07/14/2021 1503   GFRNONAA >60 01/17/2018 0128   GFRAA >60 01/17/2018 0128       Component Value Date/Time   WBC 6.5 09/19/2020 1632   RBC 4.42 09/19/2020  1632   HGB 13.4 09/19/2020 1632   HCT 40.0 09/19/2020 1632   PLT 330.0 09/19/2020 1632   MCV 90.5 09/19/2020 1632   MCH 30.4 01/17/2018 0128   MCHC 33.5 09/19/2020 1632   RDW 14.9 09/19/2020 1632   LYMPHSABS 2.7 03/07/2012 1556   MONOABS 0.6 03/07/2012 1556   EOSABS 0.1 03/07/2012 1556   BASOSABS 0.0 03/07/2012 1556    No results found for: "POCLITH", "LITHIUM"   No results found for: "PHENYTOIN", "PHENOBARB", "VALPROATE", "CBMZ"   .res Assessment: Plan:    Plan:  PDMP reviewed  1. Buspar '5mg'$  TID 2. Effexor XR '75mg'$  to '150mg'$  daily   3. Xanax '1mg'$  BID  Therapist - Marya Fossa  RTC 3 months  Patient advised to contact office with any questions, adverse effects, or acute worsening in signs and symptoms.  Discussed potential benefits, risk, and side effects of benzodiazepines to include potential risk of tolerance and dependence, as well as possible drowsiness.  Advised patient not to drive if experiencing drowsiness and to take lowest possible effective dose to minimize risk of dependence and tolerance.  Discussed potential metabolic side effects associated with atypical antipsychotics, as well as potential risk for movement side effects. Advised pt to contact office if movement side effects occur.   Diagnoses and all orders for this visit:  Major depressive disorder,  recurrent episode, moderate (HCC)  Generalized anxiety disorder -     busPIRone (BUSPAR) 5 MG tablet; TAKE 1 TABLET(5 MG) BY MOUTH THREE TIMES DAILY -     ALPRAZolam (XANAX) 1 MG tablet; Take one tablet twice daily. -     venlafaxine XR (EFFEXOR-XR) 150 MG 24 hr capsule; Take one capsule every morning.  Panic attacks  Insomnia, unspecified type     Please see After Visit Summary for patient specific instructions.  Future Appointments  Date Time Provider [place]  01/21/2022  9:40 AM Mosie Lukes, MD [facility]    No orders of the defined types were placed in this encounter.   -------------------------------

## 2021-12-04 ENCOUNTER — Other Ambulatory Visit: Payer: Self-pay

## 2021-12-04 MED ORDER — LEVOTHYROXINE SODIUM 25 MCG PO TABS: ORAL_TABLET | ORAL | 5 refills | Status: DC

## 2021-12-08 ENCOUNTER — Other Ambulatory Visit: Payer: Self-pay | Admitting: Family Medicine

## 2021-12-09 MED ORDER — CYCLOBENZAPRINE HCL 10 MG PO TABS: 5.0000 mg | ORAL_TABLET | Freq: Three times a day (TID) | ORAL | 0 refills | Status: DC | PRN

## 2022-01-04 ENCOUNTER — Other Ambulatory Visit: Payer: Self-pay | Admitting: Family Medicine

## 2022-01-04 DIAGNOSIS — N6320 Unspecified lump in the left breast, unspecified quadrant: Secondary | ICD-10-CM

## 2022-01-21 ENCOUNTER — Encounter: Payer: Self-pay | Admitting: Family Medicine

## 2022-02-04 ENCOUNTER — Telehealth: Payer: Self-pay | Admitting: Family Medicine

## 2022-02-04 NOTE — Telephone Encounter (Signed)
Rep from [place] stated pt is needing a mammogram ordered. Looks like pt is already scheduled.

## 2022-02-04 NOTE — Telephone Encounter (Signed)
Appt was made

## 2022-02-08 ENCOUNTER — Ambulatory Visit
Admission: RE | Admit: 2022-02-08 | Discharge: 2022-02-08 | Disposition: A | Discharge status: Home / Self Care | Payer: 59 | Source: Ambulatory Visit | Attending: Family Medicine | Admitting: Family Medicine

## 2022-02-08 ENCOUNTER — Ambulatory Visit
Admission: RE | Admit: 2022-02-08 | Discharge: 2022-02-08 | Disposition: A | Discharge status: Home / Self Care | Payer: Self-pay | Source: Ambulatory Visit | Attending: Family Medicine | Admitting: Family Medicine

## 2022-02-08 DIAGNOSIS — N6321 Unspecified lump in the left breast, upper outer quadrant: Secondary | ICD-10-CM | POA: Diagnosis not present

## 2022-02-08 DIAGNOSIS — N6320 Unspecified lump in the left breast, unspecified quadrant: Secondary | ICD-10-CM

## 2022-02-08 DIAGNOSIS — R922 Inconclusive mammogram: Secondary | ICD-10-CM | POA: Diagnosis not present

## 2022-02-16 DIAGNOSIS — R69 Illness, unspecified: Secondary | ICD-10-CM | POA: Diagnosis not present

## 2022-02-24 ENCOUNTER — Encounter: Payer: Self-pay | Admitting: Adult Health

## 2022-02-24 ENCOUNTER — Ambulatory Visit (INDEPENDENT_AMBULATORY_CARE_PROVIDER_SITE_OTHER): Payer: 59 | Admitting: Adult Health

## 2022-02-24 DIAGNOSIS — F331 Major depressive disorder, recurrent, moderate: Secondary | ICD-10-CM

## 2022-02-24 DIAGNOSIS — F410 Panic disorder [episodic paroxysmal anxiety] without agoraphobia: Secondary | ICD-10-CM | POA: Diagnosis not present

## 2022-02-24 DIAGNOSIS — R69 Illness, unspecified: Secondary | ICD-10-CM | POA: Diagnosis not present

## 2022-02-24 DIAGNOSIS — F411 Generalized anxiety disorder: Secondary | ICD-10-CM

## 2022-02-24 DIAGNOSIS — G4700 Insomnia, unspecified: Secondary | ICD-10-CM

## 2022-02-24 MED ORDER — BUSPIRONE HCL 5 MG PO TABS: ORAL_TABLET | ORAL | 3 refills | Status: DC

## 2022-02-24 MED ORDER — ALPRAZOLAM 1 MG PO TABS: ORAL_TABLET | ORAL | 2 refills | Status: DC

## 2022-02-24 MED ORDER — VENLAFAXINE HCL ER 150 MG PO CP24: ORAL_CAPSULE | ORAL | 3 refills | Status: DC

## 2022-02-24 NOTE — Progress Notes (Signed)
[name] [id number] 12/24/1970 52 y.o.  Subjective:   Patient ID:  Lauren Hall is a 52 y.o. (DOB [birth year]) female.  Chief Complaint: No chief complaint on file.   HPI Lauren DAMIYA Hall presents to the office today for follow-up of MDD, GAD and insomnia and panic attacks.  Describes mood today as "ok". Pleasant.Tearful at times. Mood symptoms - reports decreased depression, anxiety and irritability. Reports decreased panic attacks. Decreased worry, rumination, and rumination. Mood is consistent. Stating "I feel more even keeled". Feels like medications are helpful.  Reports ongoing situational stressors - "managing better". Working with a therapist - Marya Fossa. Improved interest and motivation. Taking medications as prescribed.  Energy levels lower. Active, does not have a regular exercise routine with physical disabilities.  Enjoys some usual interests and activities. Married. Lives with husband of 20 years (dialysis). Has one daughter - age 64.  Appetite adequate. Weight gain - 169 pounds. Sleeping better some nights than others. Averages 6 to 7 hours. Focus and concentration difficulties - "fibro fog". Completing tasks. Managing some aspects of household. 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  Denies AH or VH. Denies self harm. Denies substance use.  Previous medication trials: Pristiq, Wellbutrin, Prozac, Cymbalta, Lexapro, Zoloft,     GAD-7    Flowsheet Row Office Visit from 07/14/2021 in [facility]  Total GAD-7 Score 15      PHQ2-9    [place] Office Visit from 07/14/2021 in [facility]  PHQ-2 Total Score 6  PHQ-9 Total Score 17        Review of Systems:  Review of Systems  Musculoskeletal:  Negative for gait problem.  Neurological:  Negative for tremors.  Psychiatric/Behavioral:         Please refer to HPI    Medications: I have reviewed the  patient's current medications.  Current Outpatient Medications  Medication Sig Dispense Refill   ALPRAZolam (XANAX) 1 MG tablet Take one tablet twice daily. 60 tablet 2   amoxicillin-clavulanate (AUGMENTIN) 875-125 MG tablet Take 1 tablet by mouth 2 (two) times daily. 20 tablet 0   atorvastatin (LIPITOR) 10 MG tablet Take 0.5 tablets (5 mg total) by mouth daily. 15 tablet 5   busPIRone (BUSPAR) 5 MG tablet TAKE 1 TABLET(5 MG) BY MOUTH THREE TIMES DAILY 270 tablet 3   cyclobenzaprine (FLEXERIL) 10 MG tablet Take 0.5-1 tablets (5-10 mg total) by mouth 3 (three) times daily as needed for muscle spasms. 90 tablet 0   gabapentin (NEURONTIN) 300 MG capsule TAKE 1 CAPSULE BY MOUTH TWICE A DAY AND 2 CAPSULES AT BEDTIME 360 capsule 1   levothyroxine (SYNTHROID) 25 MCG tablet TAKE 1 TABLET(25 MCG) BY MOUTH DAILY BEFORE BREAKFAST 30 tablet 5   lisinopril (ZESTRIL) 10 MG tablet Take 1 tablet (10 mg total) by mouth in the morning and at bedtime. 60 tablet 3   LOW-DOSE ASPIRIN PO      omeprazole (PRILOSEC) 40 MG capsule Take 1 capsule (40 mg total) by mouth daily. 90 capsule 0   ondansetron (ZOFRAN) 4 MG tablet Take 1 tablet (4 mg total) by mouth every 8 (eight) hours as needed for nausea or vomiting. 30 tablet 1   venlafaxine XR (EFFEXOR-XR) 150 MG 24 hr capsule Take one capsule every morning. 90 capsule 3   No current facility-administered medications for this visit.    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   Bronchitis 07/27/2011   Chiari malformation 09/01/173   Complication of anesthesia    patient woke up while they were sewing up her incision with breast lumpectomy   Depression    Dysrhythmia    Tachycardia   Enlarged thyroid    Fatigue 10/08/2010   Fibromyalgia 2009   GERD  (gastroesophageal reflux disease) 09/26/2013   Headache(784.0) 10/08/2010   Migraines   Hip pain, bilateral 11/06/2010   Hyperglycemia 08/11/2015   normal Hgb A 1C per patient   Hypertension    Hypothyroidism 10/08/2010   Low back pain radiating to right leg 03/11/2015   Other and unspecified hyperlipidemia 07/29/2012   Peripheral neuropathy 11/11/2012   burning sensation mainly right leg   Poor concentration 01/11/2011   Preventative health care 07/29/2012   S/P laparoscopic hysterectomy 10/07/2015   Sinusitis, acute 07/27/2011   Tachycardia    Thyroid disease 10/08/2010   Tick bite of flank 11/09/2011   Tinea corporis 11/11/2012   Uterine leiomyoma     Past Medical History, Surgical history, Social history, and Family history were reviewed and updated as appropriate.   Please see review of systems for further details on the patient's review from today.   Objective:   Physical Exam:  LMP 09/02/2015 (Approximate) Comment: continous bleeding  Physical Exam Constitutional:      General: She is not in acute distress. Musculoskeletal:        General: No deformity.  Neurological:     Mental Status: She is alert and oriented to person, place, and time.     Coordination: Coordination normal.  Psychiatric:        Attention and Perception: Attention and perception normal. She does not perceive auditory or visual hallucinations.        Mood and Affect: Mood normal. Mood is not anxious or depressed. Affect is not labile, blunt, angry or inappropriate.        Speech: Speech normal.        Behavior: Behavior normal.        Thought Content: Thought content normal. Thought content is not paranoid or delusional. Thought content does not include homicidal or suicidal ideation. Thought content does not include homicidal or suicidal plan.        Cognition and Memory: Cognition and memory normal.        Judgment: Judgment normal.     Comments: Insight intact     Lab Review:     Component Value  Date/Time   NA 139 07/14/2021 1503   K 4.3 07/14/2021 1503   CL 101 07/14/2021 1503   CO2 31 07/14/2021 1503   GLUCOSE 111 (H) 07/14/2021 1503   BUN 6 07/14/2021 1503   CREATININE 0.76 07/14/2021 1503   CREATININE 0.79 09/24/2013 1544   CALCIUM 9.8 07/14/2021 1503   PROT 7.6 07/14/2021 1503   ALBUMIN 4.6 07/14/2021 1503   AST 23 07/14/2021 1503   ALT 34 07/14/2021 1503   ALKPHOS 144 (H) 07/14/2021 1503   BILITOT 0.4 07/14/2021 1503   GFRNONAA >60 01/17/2018 0128   GFRAA >60 01/17/2018 0128       Component Value Date/Time   WBC 6.5 09/19/2020 1632   RBC 4.42 09/19/2020 1632   HGB 13.4 09/19/2020 1632   HCT 40.0 09/19/2020 1632  PLT 330.0 09/19/2020 1632   MCV 90.5 09/19/2020 1632   MCH 30.4 01/17/2018 0128   MCHC 33.5 09/19/2020 1632   RDW 14.9 09/19/2020 1632   LYMPHSABS 2.7 03/07/2012 1556   MONOABS 0.6 03/07/2012 1556   EOSABS 0.1 03/07/2012 1556   BASOSABS 0.0 03/07/2012 1556    No results found for: "POCLITH", "LITHIUM"   No results found for: "PHENYTOIN", "PHENOBARB", "VALPROATE", "CBMZ"   .res Assessment: Plan:    Plan:  PDMP reviewed  1. Buspar '5mg'$  TID 2. Effexor XR '150mg'$  daily   3. Xanax '1mg'$  BID  Therapist - Marya Fossa  RTC 3 months  Patient advised to contact office with any questions, adverse effects, or acute worsening in signs and symptoms.  Discussed potential benefits, risk, and side effects of benzodiazepines to include potential risk of tolerance and dependence, as well as possible drowsiness.  Advised patient not to drive if experiencing drowsiness and to take lowest possible effective dose to minimize risk of dependence and tolerance.  Discussed potential metabolic side effects associated with atypical antipsychotics, as well as potential risk for movement side effects. Advised pt to contact office if movement side effects occur.  Diagnoses and all orders for this visit:  Generalized anxiety disorder -     venlafaxine XR (EFFEXOR-XR)  150 MG 24 hr capsule; Take one capsule every morning. -     busPIRone (BUSPAR) 5 MG tablet; TAKE 1 TABLET(5 MG) BY MOUTH THREE TIMES DAILY -     ALPRAZolam (XANAX) 1 MG tablet; Take one tablet twice daily.     Please see After Visit Summary for patient specific instructions.  Future Appointments  Date Time Provider [place]  03/30/2022  2:40 PM Mosie Lukes, MD [facility]    No orders of the defined types were placed in this encounter.   -------------------------------

## 2022-03-21 ENCOUNTER — Other Ambulatory Visit: Payer: Self-pay | Admitting: Family Medicine

## 2022-03-30 ENCOUNTER — Ambulatory Visit (INDEPENDENT_AMBULATORY_CARE_PROVIDER_SITE_OTHER): Payer: 59 | Admitting: Family Medicine

## 2022-03-30 VITALS — BP 140/88 | HR 121 | Temp 98.0°F | Resp 16 | Ht 59.0 in | Wt 168.2 lb

## 2022-03-30 DIAGNOSIS — R739 Hyperglycemia, unspecified: Secondary | ICD-10-CM | POA: Diagnosis not present

## 2022-03-30 DIAGNOSIS — I10 Essential (primary) hypertension: Secondary | ICD-10-CM | POA: Diagnosis not present

## 2022-03-30 DIAGNOSIS — U071 COVID-19: Secondary | ICD-10-CM | POA: Diagnosis not present

## 2022-03-30 DIAGNOSIS — R0989 Other specified symptoms and signs involving the circulatory and respiratory systems: Secondary | ICD-10-CM

## 2022-03-30 DIAGNOSIS — E782 Mixed hyperlipidemia: Secondary | ICD-10-CM | POA: Diagnosis not present

## 2022-03-30 DIAGNOSIS — E079 Disorder of thyroid, unspecified: Secondary | ICD-10-CM | POA: Diagnosis not present

## 2022-03-30 LAB — POCT INFLUENZA A/B
Influenza A, POC: NEGATIVE
Influenza B, POC: NEGATIVE

## 2022-03-30 LAB — POC COVID19 BINAXNOW: SARS Coronavirus 2 Ag: POSITIVE — AB

## 2022-03-30 MED ORDER — GUAIFENESIN-CODEINE 100-10 MG/5ML PO SOLN: 5.0000 mL | Freq: Four times a day (QID) | ORAL | 0 refills | Status: DC | PRN

## 2022-03-30 MED ORDER — NIRMATRELVIR/RITONAVIR (PAXLOVID)TABLET: 3.0000 | ORAL_TABLET | Freq: Two times a day (BID) | ORAL | 0 refills | Status: AC

## 2022-03-30 NOTE — Assessment & Plan Note (Signed)
Well controlled, no changes to meds. Encouraged heart healthy diet such as the DASH diet and exercise as tolerated.  °

## 2022-03-30 NOTE — Assessment & Plan Note (Signed)
hgba1c acceptable, minimize simple carbs. Increase exercise as tolerated.  

## 2022-03-30 NOTE — Patient Instructions (Addendum)
Plain Mucinex twice daily  x 7-14 days Probiotic daily Hydrate at least 60-80 ounces of non caffeine liquids COVID-19 COVID-19 is an infection caused by a virus called SARS-CoV-2. Most people who get COVID-19 have mild to moderate symptoms. Some have little to no symptoms. In others, the virus may cause a severe infection. What are the causes? COVID-19 is caused by a coronavirus. The virus may be in the air as droplets or as tiny specks of fluid (aerosols). It may also be on surfaces. You may catch the virus if you: Breathe in droplets when a person with COVID-19 breathes, speaks, sings, coughs, or sneezes. Touch something that has the virus on it and then touch your mouth, nose, or eyes. What increases the risk? Risk for infection: You are more likely to get COVID-19 if: You are within 6 ft (1.8 m) of a person who has COVID-19 for 15 minutes or longer. You provide care to a person who has COVID-19. You are in close contact with others. This includes hugging, kissing, or sharing utensils. Risk for serious illness caused by COVID-19: You are more likely to get very ill from COVID-19 if: You have cancer. You have a long-term (chronic) disease. This may be: A chronic lung disease, such as pulmonary embolism, chronic obstructive pulmonary disease (COPD), or cystic fibrosis. A disease that affects your body's defense system (immune system). If you have a weak immune system, you are said to be immunocompromised. A serious heart condition, such as heart failure, coronary artery disease, or cardiomyopathy. Diabetes. Chronic kidney disease. A liver disease, such as cirrhosis, nonalcoholic fatty liver disease, alcoholic liver disease, or autoimmune hepatitis. You are obese. You are pregnant or were just pregnant. You have sickle cell disease. What are the signs or symptoms? Symptoms of COVID-19 can range from mild to severe. They may appear any time from 2 to 14 days after you are exposed. They  include: Fever or chills. Shortness of breath or trouble breathing. Feeling tired. Headaches, body aches, or muscle aches. A runny or stuffy nose. Sneezing, coughing, or a sore throat. New loss of taste or smell. You may also have stomach problems, such as nausea, vomiting, or diarrhea. In some cases, you may not have any symptoms. How is this diagnosed? COVID-19 may be diagnosed by testing a sample to check for the virus. The most common tests are the PCR test and the antigen test. Tests may be done in the lab or at home. They include: Using a swab to take a sample of fluid from your nose. Testing a sample of saliva from your mouth. Testing a sample of mucus from your lungs (sputum). How is this treated? Treatment for COVID-19 depends on how severe your condition is. Mild symptoms can be treated at home. You should rest, drink fluids, and take over-the-counter medicine. If you have symptoms and risk factors, you may be prescribed a medicine that fights viruses (antiviral). Severe symptoms may be treated in a hospital intensive care unit (ICU). Treatment may include: Extra oxygen given through a tube in the nose, a face mask, or a hood. Medicines. These may include: Antivirals, such as remdesivir. Anti-inflammatories, such as corticosteroids. These help reduce inflammation. Antithrombotics. These help prevent or treat blood clots. Convalescent plasma. This helps boost your immune system. Prone positioning. This is when you are laid on your stomach to help oxygen get into your lungs. Infection control measures. If you are at risk for a more serious illness, your health care provider may prescribe  two medicines to help your immune system protect you. These are called long-acting monoclonal antibodies. They are given together every 6 months. How is this prevented? To protect yourself: Get the vaccine or vaccine series if you meet the guidelines. You can even get the vaccine while you are  pregnant or making breast milk (lactating). Get an added dose of the vaccine if you are immunocompromised. This applies if you have had an organ transplant or if you have a condition that affects your immune system. You should get the added dose 4 weeks after you got the first one. If you get an mRNA vaccine, you will need to get 3 doses. Talk to your provider about getting experimental monoclonal antibodies. This treatment can help prevent severe illness. It may be given to you if: You are immunocompromised. You cannot get the vaccine. You may not get the vaccine if you have a severe allergic reaction to it or to what it is made of. You are not fully vaccinated. You are in a place where there is COVID-19 and: You are in close contact with someone who has COVID-19. You are at high risk of being exposed. You are at risk of illness from new variants of the virus. To protect others: If you have symptoms of COVID-19, take steps to stop the virus from spreading. Stay home. Leave your house only to get medical care. Do not use public transit. Do not travel while you are sick. Wash your hands often with soap and water for at least 20 seconds. If soap and water are not available, use alcohol-based hand sanitizer. Make sure that all people in your household wash their hands well and often. Cough or sneeze into a tissue or your sleeve or elbow. Do not cough or sneeze into your hand or into the air. Where to find more information Centers for Disease Control and Prevention (CDC): [employer].cy World Health Organization [place] [facility]): http://curry.org/ Get help right away if: You have trouble breathing. You have pain or pressure in your chest. You are confused. Your lips or fingernails turn blue. You have trouble waking from sleep. Your symptoms get worse. These symptoms may be an emergency. Get help right away. Call 911. Do not wait to see if the symptoms will go away. Do not drive yourself to the hospital. This  information is not intended to replace advice given to you by your health care provider. Make sure you discuss any questions you have with your health care provider. Document Revised: 10/02/2021 Document Reviewed: 10/02/2021 Elsevier Patient Education  [place].   Upper Respiratory Infection, Adult An upper respiratory infection (URI) is a common viral infection of the nose, throat, and upper air passages that lead to the lungs. The most common type of URI is the common cold. URIs usually get better on their own, without medical treatment. What are the causes? A URI is caused by a virus. You may catch a virus by: Breathing in droplets from an infected person's cough or sneeze. Touching something that has been exposed to the virus (is contaminated) and then touching your mouth, nose, or eyes. What increases the risk? You are more likely to get a URI if: You are very young or very old. You have close contact with others, such as at work, school, or a health care facility. You smoke. You have long-term (chronic) heart or lung disease. You have a weakened disease-fighting system (immune system). You have nasal allergies or asthma. You are experiencing a lot of  stress. You have poor nutrition. What are the signs or symptoms? A URI usually involves some of the following symptoms: Runny or stuffy (congested) nose. Cough. Sneezing. Sore throat. Headache. Fatigue. Fever. Loss of appetite. Pain in your forehead, behind your eyes, and over your cheekbones (sinus pain). Muscle aches. Redness or irritation of the eyes. Pressure in the ears or face. How is this diagnosed? This condition may be diagnosed based on your medical history and symptoms, and a physical exam. Your health care provider may use a swab to take a mucus sample from your nose (nasal swab). This sample can be tested to determine what virus is causing the illness. How is this treated? URIs usually get better on  their own within 7-10 days. Medicines cannot cure URIs, but your health care provider may recommend certain medicines to help relieve symptoms, such as: Over-the-counter cold medicines. Cough suppressants. Coughing is a type of defense against infection that helps to clear the respiratory system, so take these medicines only as recommended by your health care provider. Fever-reducing medicines. Follow these instructions at home: Activity Rest as needed. If you have a fever, stay home from work or school until your fever is gone or until your health care provider says your URI cannot spread to other people (is no longer contagious). Your health care provider may have you wear a face mask to prevent your infection from spreading. Relieving symptoms Gargle with a mixture of salt and water 3-4 times a day or as needed. To make salt water, completely dissolve -1 tsp (3-6 g) of salt in 1 cup (237 mL) of warm water. Use a cool-mist humidifier to add moisture to the air. This can help you breathe more easily. Eating and drinking  Drink enough fluid to keep your urine pale yellow. Eat soups and other clear broths. General instructions  Take over-the-counter and prescription medicines only as told by your health care provider. These include cold medicines, fever reducers, and cough suppressants. Do not use any products that contain nicotine or tobacco. These products include cigarettes, chewing tobacco, and vaping devices, such as e-cigarettes. If you need help quitting, ask your health care provider. Stay away from secondhand smoke. Stay up to date on all immunizations, including the yearly (annual) flu vaccine. Keep all follow-up visits. This is important. How to prevent the spread of infection to others URIs can be contagious. To prevent the infection from spreading: Wash your hands with soap and water for at least 20 seconds. If soap and water are not available, use hand sanitizer. Avoid touching  your mouth, face, eyes, or nose. Cough or sneeze into a tissue or your sleeve or elbow instead of into your hand or into the air.  Contact a health care provider if: You are getting worse instead of better. You have a fever or chills. Your mucus is brown or red. You have yellow or brown discharge coming from your nose. You have pain in your face, especially when you bend forward. You have swollen neck glands. You have pain while swallowing. You have white areas in the back of your throat. Get help right away if: You have shortness of breath that gets worse. You have severe or persistent: Headache. Ear pain. Sinus pain. Chest pain. You have chronic lung disease along with any of the following: Making high-pitched whistling sounds when you breathe, most often when you breathe out (wheezing). Prolonged cough (more than 14 days). Coughing up blood. A change in your usual mucus. You have  a stiff neck. You have changes in your: Vision. Hearing. Thinking. Mood. These symptoms may be an emergency. Get help right away. Call 911. Do not wait to see if the symptoms will go away. Do not drive yourself to the hospital. Summary An upper respiratory infection (URI) is a common infection of the nose, throat, and upper air passages that lead to the lungs. A URI is caused by a virus. URIs usually get better on their own within 7-10 days. Medicines cannot cure URIs, but your health care provider may recommend certain medicines to help relieve symptoms. This information is not intended to replace advice given to you by your health care provider. Make sure you discuss any questions you have with your health care provider. Document Revised: 08/20/2020 Document Reviewed: 08/20/2020 Elsevier Patient Education  [place].

## 2022-03-30 NOTE — Progress Notes (Signed)
Subjective:   By signing my name below, I, Kellie Simmering, attest that this documentation has been prepared under the direction and in the presence of Mosie Lukes, MD., 03/30/2022.   Patient ID: Lauren Hall, female    DOB: Mar 09, 1970, 52 y.o.   MRN: [id number]  No chief complaint on file.  HPI Patient is in today for an office visit. She denies constipation/diarrhea/nausea/ vomiting/urgency/frequency.  COVID-19 Patient has tested positive for COVID-19 today and is interested in taking Paxlovid. She has been coughing since 03/27/2022 but states that this worsened yesterday. She is experiencing pain in her chest which worsens with coughing. She is also producing thick, yellow sputum and feels congested.   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   Bronchitis 07/27/2011   Chiari malformation [id number]   Complication of anesthesia    patient woke up while they were sewing up her incision with breast lumpectomy   Depression    Dysrhythmia    Tachycardia   Enlarged thyroid    Fatigue 10/08/2010   Fibromyalgia 2009   GERD (gastroesophageal reflux disease) 09/26/2013   Headache(784.0) 10/08/2010   Migraines   Hip pain, bilateral 11/06/2010   Hyperglycemia 08/11/2015   normal Hgb A 1C per patient   Hypertension    Hypothyroidism 10/08/2010   Low back pain radiating to right leg 03/11/2015   Other and unspecified hyperlipidemia 07/29/2012   Peripheral neuropathy 11/11/2012   burning sensation mainly right leg   Poor concentration 01/11/2011   Preventative health care 07/29/2012   S/P laparoscopic hysterectomy 10/07/2015   Sinusitis, acute 07/27/2011   Tachycardia    Thyroid disease 10/08/2010   Tick bite of flank 11/09/2011   Tinea corporis 11/11/2012   Uterine leiomyoma     Past Surgical History:  Procedure Laterality Date   BREAST LUMPECTOMY Right 1999   benign    CYSTOSCOPY N/A 10/07/2015   Procedure:  CYSTOSCOPY;  Surgeon: Sanjuana Kava, MD;  Location: [facility];  Service: Gynecology;  Laterality: N/A;    Family History  Problem Relation Age of Onset   Fibromyalgia Mother    Diabetes Mother        type 2   Hypertension Mother    Hyperlipidemia Mother    Arthritis Mother    Alcohol abuse Father    Colon cancer Father    Hypertension Brother    Alcohol abuse Brother    Heart disease Maternal Grandmother    Hyperlipidemia Maternal Grandmother    Diabetes Maternal Grandmother        type 2   Colon cancer Maternal Grandmother    Stroke Maternal Grandfather    Diabetes Maternal Grandfather        type 2   Ovarian cancer Paternal Grandmother    Alzheimer's disease Paternal Grandfather     Social History   Socioeconomic History   Marital status: Married    Spouse name: Derrick   Number of children: Not on file   Years of education: Not on file   Highest education level: Not on file  Occupational History   Not on file  Tobacco Use   Smoking status: Never   Smokeless tobacco: Never  Substance and Sexual Activity   Alcohol use: Yes    Comment: special occassion- very rarely   Drug use: No   Sexual activity: Yes    Partners: Male    Birth control/protection: None  Other Topics Concern   Not on file  Social History Narrative   Not on file   Social Determinants of Health   Financial Resource Strain: Not on file  Food Insecurity: Not on file  Transportation Needs: Not on file  Physical Activity: Not on file  Stress: Not on file  Social Connections: Not on file  Intimate Partner Violence: Not on file    Outpatient Medications Prior to Visit  Medication Sig Dispense Refill   ALPRAZolam (XANAX) 1 MG tablet Take one tablet twice daily. 60 tablet 2   amoxicillin-clavulanate (AUGMENTIN) 875-125 MG tablet Take 1 tablet by mouth 2 (two) times daily. 20 tablet 0   atorvastatin (LIPITOR) 10 MG tablet Take 0.5 tablets (5 mg total) by mouth daily. 15 tablet 5   busPIRone  (BUSPAR) 5 MG tablet TAKE 1 TABLET(5 MG) BY MOUTH THREE TIMES DAILY 270 tablet 3   cyclobenzaprine (FLEXERIL) 10 MG tablet Take 0.5-1 tablets (5-10 mg total) by mouth 3 (three) times daily as needed for muscle spasms. 90 tablet 0   gabapentin (NEURONTIN) 300 MG capsule TAKE 1 CAPSULE BY MOUTH TWICE A DAY AND 2 CAPSULES AT BEDTIME 360 capsule 1   levothyroxine (SYNTHROID) 25 MCG tablet TAKE 1 TABLET(25 MCG) BY MOUTH DAILY BEFORE BREAKFAST 30 tablet 5   lisinopril (ZESTRIL) 10 MG tablet TAKE 1 TABLET IN THE MORNING AND 1 TABLET AT BEDTIME 60 tablet 3   LOW-DOSE ASPIRIN PO      omeprazole (PRILOSEC) 40 MG capsule Take 1 capsule (40 mg total) by mouth daily. 90 capsule 0   ondansetron (ZOFRAN) 4 MG tablet Take 1 tablet (4 mg total) by mouth every 8 (eight) hours as needed for nausea or vomiting. 30 tablet 1   venlafaxine XR (EFFEXOR-XR) 150 MG 24 hr capsule Take one capsule every morning. 90 capsule 3   No facility-administered medications prior to visit.    Allergies  Allergen Reactions   Promethazine Anaphylaxis   Promethazine Hcl Anaphylaxis    Review of Systems  Gastrointestinal:  Negative for constipation, diarrhea, nausea and vomiting.  Genitourinary:  Negative for frequency and urgency.       Objective:    Physical Exam Constitutional:      General: She is not in acute distress.    Appearance: Normal appearance. She is normal weight. She is not ill-appearing.  HENT:     Head: Normocephalic and atraumatic.     Right Ear: Hearing, ear canal and external ear normal. Tympanic membrane is erythematous.     Left Ear: Hearing, tympanic membrane, ear canal and external ear normal. Tympanic membrane is not erythematous.     Nose: Nose normal.     Mouth/Throat:     Mouth: Mucous membranes are moist.     Pharynx: Oropharynx is clear.  Eyes:     General:        Right eye: No discharge.        Left eye: No discharge.     Extraocular Movements: Extraocular movements intact.      Conjunctiva/sclera: Conjunctivae normal.     Pupils: Pupils are equal, round, and reactive to light.  Cardiovascular:     Rate and Rhythm: Normal rate and regular rhythm.     Pulses: Normal pulses.     Heart sounds: Normal heart sounds. No murmur heard.    No gallop.  Pulmonary:     Effort: Pulmonary effort is normal. No respiratory distress.     Breath sounds: Examination of the right-upper  field reveals rhonchi. Examination of the right-middle field reveals rhonchi. Rhonchi present. No wheezing or rales.  Abdominal:     General: Bowel sounds are normal.     Palpations: Abdomen is soft.     Tenderness: There is no abdominal tenderness. There is no guarding.  Musculoskeletal:        General: Normal range of motion.     Cervical back: Normal range of motion.     Right lower leg: No edema.     Left lower leg: No edema.  Skin:    General: Skin is warm and dry.  Neurological:     Mental Status: She is alert and oriented to person, place, and time.  Psychiatric:        Mood and Affect: Mood normal.        Behavior: Behavior normal.        Judgment: Judgment normal.     LMP 09/02/2015 (Approximate) Comment: continous bleeding Wt Readings from Last 3 Encounters:  07/14/21 169 lb 3.2 oz (76.7 kg)  11/13/20 183 lb (83 kg)  06/27/19 163 lb (73.9 kg)    Diabetic Foot Exam - Simple   No data filed    Lab Results  Component Value Date   WBC 6.5 09/19/2020   HGB 13.4 09/19/2020   HCT 40.0 09/19/2020   PLT 330.0 09/19/2020   GLUCOSE 111 (H) 07/14/2021   CHOL 222 (H) 09/19/2020   TRIG 247.0 (H) 09/19/2020   HDL 50.00 09/19/2020   LDLDIRECT 146.0 09/19/2020   LDLCALC 103 (H) 05/08/2019   ALT 34 07/14/2021   AST 23 07/14/2021   NA 139 07/14/2021   K 4.3 07/14/2021   CL 101 07/14/2021   CREATININE 0.76 07/14/2021   BUN 6 07/14/2021   CO2 31 07/14/2021   TSH 0.76 09/19/2020   HGBA1C 6.1 07/14/2021    Lab Results  Component Value Date   TSH 0.76 09/19/2020   Lab  Results  Component Value Date   WBC 6.5 09/19/2020   HGB 13.4 09/19/2020   HCT 40.0 09/19/2020   MCV 90.5 09/19/2020   PLT 330.0 09/19/2020   Lab Results  Component Value Date   NA 139 07/14/2021   K 4.3 07/14/2021   CO2 31 07/14/2021   GLUCOSE 111 (H) 07/14/2021   BUN 6 07/14/2021   CREATININE 0.76 07/14/2021   BILITOT 0.4 07/14/2021   ALKPHOS 144 (H) 07/14/2021   AST 23 07/14/2021   ALT 34 07/14/2021   PROT 7.6 07/14/2021   ALBUMIN 4.6 07/14/2021   CALCIUM 9.8 07/14/2021   ANIONGAP 12 01/17/2018   GFR 89.95 07/14/2021   Lab Results  Component Value Date   CHOL 222 (H) 09/19/2020   Lab Results  Component Value Date   HDL 50.00 09/19/2020   Lab Results  Component Value Date   LDLCALC 103 (H) 05/08/2019   Lab Results  Component Value Date   TRIG 247.0 (H) 09/19/2020   Lab Results  Component Value Date   CHOLHDL 4 09/19/2020   Lab Results  Component Value Date   HGBA1C 6.1 07/14/2021  COVID-19: Prescribed Paxlovid and Guaifenesin-Codeine 100-10 mg/5 mL. Ordered an x-ray of the chest. Encouraged 60-80 oz of non-caffeine liquids, probiotics, and regular Mucinex bid for 7-14 days.    Assessment & Plan:   Problem List Items Addressed This Visit   None  No orders of the defined types were placed in this encounter.  I, Kellie Simmering, personally preformed the services described in this documentation.  All medical record entries made by the scribe were at my direction and in my presence.  I have reviewed the chart and discharge instructions (if applicable) and agree that the record reflects my personal performance and is accurate and complete. 03/30/2022  I,Mohammed Iqbal,acting as a scribe for Penni Homans, MD.,have documented all relevant documentation on the behalf of Penni Homans, MD,as directed by  Penni Homans, MD while in the presence of Penni Homans, MD.  Kellie Simmering

## 2022-03-30 NOTE — Assessment & Plan Note (Signed)
On Levothyroxine, continue to monitor 

## 2022-03-31 DIAGNOSIS — U071 COVID-19: Secondary | ICD-10-CM | POA: Insufficient documentation

## 2022-03-31 NOTE — Assessment & Plan Note (Signed)
Paxlovid prescribed. Mucinex bid, Encouraged increased rest and hydration, add probiotics, zinc such as Coldeze or Xicam. Treat fevers as needed

## 2022-04-09 ENCOUNTER — Encounter: Payer: Self-pay | Admitting: Family Medicine

## 2022-04-09 ENCOUNTER — Other Ambulatory Visit: Payer: Self-pay | Admitting: Family Medicine

## 2022-04-09 MED ORDER — AMOXICILLIN-POT CLAVULANATE 875-125 MG PO TABS: 1.0000 | ORAL_TABLET | Freq: Two times a day (BID) | ORAL | 0 refills | Status: DC

## 2022-04-26 ENCOUNTER — Encounter: Payer: Self-pay | Admitting: Family Medicine

## 2022-04-27 ENCOUNTER — Other Ambulatory Visit: Payer: Self-pay | Admitting: Family Medicine

## 2022-04-27 MED ORDER — BENZONATATE 100 MG PO CAPS: 100.0000 mg | ORAL_CAPSULE | Freq: Three times a day (TID) | ORAL | 0 refills | Status: DC | PRN

## 2022-05-04 DIAGNOSIS — F332 Major depressive disorder, recurrent severe without psychotic features: Secondary | ICD-10-CM | POA: Diagnosis not present

## 2022-05-06 DIAGNOSIS — R102 Pelvic and perineal pain: Secondary | ICD-10-CM | POA: Diagnosis not present

## 2022-05-26 ENCOUNTER — Encounter: Payer: Self-pay | Admitting: Adult Health

## 2022-05-26 ENCOUNTER — Telehealth (INDEPENDENT_AMBULATORY_CARE_PROVIDER_SITE_OTHER): Payer: 59 | Admitting: Adult Health

## 2022-05-26 DIAGNOSIS — G4700 Insomnia, unspecified: Secondary | ICD-10-CM

## 2022-05-26 DIAGNOSIS — F410 Panic disorder [episodic paroxysmal anxiety] without agoraphobia: Secondary | ICD-10-CM

## 2022-05-26 DIAGNOSIS — F331 Major depressive disorder, recurrent, moderate: Secondary | ICD-10-CM

## 2022-05-26 DIAGNOSIS — F411 Generalized anxiety disorder: Secondary | ICD-10-CM | POA: Diagnosis not present

## 2022-05-26 MED ORDER — ALPRAZOLAM 1 MG PO TABS: ORAL_TABLET | ORAL | 2 refills | Status: DC

## 2022-05-26 NOTE — Progress Notes (Signed)
Lauren Hall [id number] [birth year] 52 y.o.  Virtual Visit via Video Note  I connected with pt @ on 05/26/22 at  3:20 PM EDT by a video enabled telemedicine application and verified that I am speaking with the correct person using two identifiers.   I discussed the limitations of evaluation and management by telemedicine and the availability of in person appointments. The patient expressed understanding and agreed to proceed.  I discussed the assessment and treatment plan with the patient. The patient was provided an opportunity to ask questions and all were answered. The patient agreed with the plan and demonstrated an understanding of the instructions.   The patient was advised to call back or seek an in-person evaluation if the symptoms worsen or if the condition fails to improve as anticipated.  I provided 21 minutes of non-face-to-face time during this encounter.  The patient was located at home.  The provider was located at [facility].   Dorothyann Gibbs, NP   Subjective:   Patient ID:  Lauren Hall is a 52 y.o. (DOB February 26, 1970) female.  Chief Complaint: No chief complaint on file.   HPI Takyla AYISHA POL presents for follow-up of MDD, GAD and insomnia and panic attacks.  Describes mood today as "ok". Pleasant. Tearful at times. Mood symptoms - reports decreased depression, anxiety and irritability. Reports decreased panic attacks - driving - sometimes for no reason. Reports worry, rumination, and over thinking. Mood is consistent. Stating "I feel like I'm doing ok". Feels like medications are helpful. Reports ongoing situational stressors - "I'm managing things better". Working with a therapist - [name]. Improved interest and motivation. Taking medications as prescribed.  Energy levels lower. Active, does not have a regular exercise routine with physical disabilities.  Enjoys some usual interests and activities. Married. Lives with husband of 20 years  (dialysis). Has one daughter - age 57.  Appetite adequate. Weight gain - 169 pounds. Sleeping better some nights than others. Averages 6 to 7 hours. Focus and concentration difficulties - not every day - reports some "fibro fog". Completing tasks. Managing some aspects of household. 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  Denies AH or VH. Denies self harm. Denies substance use.  Previous medication trials: Pristiq, Wellbutrin, Prozac, Cymbalta, Lexapro, Zoloft,    Review of Systems:  Review of Systems  Musculoskeletal:  Negative for gait problem.  Neurological:  Negative for tremors.  Psychiatric/Behavioral:         Please refer to HPI    Medications: I have reviewed the patient's current medications.  Current Outpatient Medications  Medication Sig Dispense Refill   ALPRAZolam (XANAX) 1 MG tablet Take one tablet twice daily. 60 tablet 2   amoxicillin-clavulanate (AUGMENTIN) 875-125 MG tablet Take 1 tablet by mouth 2 (two) times daily. 20 tablet 0   atorvastatin (LIPITOR) 10 MG tablet Take 0.5 tablets (5 mg total) by mouth daily. 15 tablet 5   benzonatate (TESSALON PERLES) 100 MG capsule Take 1-2 capsules (100-200 mg total) by mouth 3 (three) times daily as needed for cough. 40 capsule 0   busPIRone (BUSPAR) 5 MG tablet TAKE 1 TABLET(5 MG) BY MOUTH THREE TIMES DAILY 270 tablet 3   cyclobenzaprine (FLEXERIL) 10 MG tablet Take 0.5-1 tablets (5-10 mg total) by mouth 3 (three) times daily as needed for muscle spasms. 90 tablet 0   gabapentin (NEURONTIN) 300 MG capsule TAKE 1 CAPSULE BY MOUTH TWICE A DAY AND 2 CAPSULES AT BEDTIME 360 capsule 1   guaiFENesin-codeine  100-10 MG/5ML syrup Take 5 mLs by mouth every 6 (six) hours as needed for cough. 180 mL 0   levothyroxine (SYNTHROID) 25 MCG tablet TAKE 1 TABLET(25 MCG) BY MOUTH DAILY BEFORE BREAKFAST 30 tablet 5   lisinopril (ZESTRIL) 10 MG tablet TAKE 1 TABLET IN THE MORNING AND 1 TABLET AT BEDTIME 60 tablet 3   LOW-DOSE ASPIRIN PO       omeprazole (PRILOSEC) 40 MG capsule Take 1 capsule (40 mg total) by mouth daily. 90 capsule 0   ondansetron (ZOFRAN) 4 MG tablet Take 1 tablet (4 mg total) by mouth every 8 (eight) hours as needed for nausea or vomiting. 30 tablet 1   venlafaxine XR (EFFEXOR-XR) 150 MG 24 hr capsule Take one capsule every morning. 90 capsule 3   No current facility-administered medications for this visit.    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   Complication of anesthesia    patient woke up while they were sewing up her incision with breast lumpectomy   Depression    Dysrhythmia    Tachycardia   Enlarged thyroid    Fatigue 10/08/2010   Fibromyalgia 2009   GERD (gastroesophageal reflux disease) 09/26/2013   Headache(784.0) 10/08/2010   Migraines   Hip pain, bilateral 11/06/2010   Hyperglycemia 08/11/2015   normal Hgb A 1C per patient   Hypertension    Hypothyroidism 10/08/2010   Low back pain radiating to right leg 03/11/2015   Other and unspecified hyperlipidemia 07/29/2012   Peripheral neuropathy 11/11/2012   burning sensation mainly right leg   Poor concentration 01/11/2011   Preventative health care 07/29/2012   S/P laparoscopic hysterectomy 10/07/2015   Sinusitis, acute 07/27/2011   Tachycardia    Thyroid disease 10/08/2010   Tick bite of flank 11/09/2011   Tinea corporis 11/11/2012   Uterine leiomyoma     Family History  Problem Relation Age of Onset   Fibromyalgia Mother    Diabetes Mother        type 2   Hypertension Mother    Hyperlipidemia Mother    Arthritis Mother    Alcohol abuse Father    Colon cancer Father    Hypertension Brother    Alcohol abuse Brother    Heart disease Maternal Grandmother    Hyperlipidemia Maternal  Grandmother    Diabetes Maternal Grandmother        type 2   Colon cancer Maternal Grandmother    Stroke Maternal Grandfather    Diabetes Maternal Grandfather        type 2   Ovarian cancer Paternal Grandmother    Alzheimer's disease Paternal Grandfather     Social History   Socioeconomic History   Marital status: Married    Spouse name: Ladene Artist   Number of children: Not on file   Years of education: Not on file   Highest education level: Not on file  Occupational History   Not on file  Tobacco Use   Smoking status: Never   Smokeless tobacco: Never  Substance and Sexual Activity   Alcohol use: Yes    Comment: special occassion- very rarely   Drug use: No   Sexual activity: Yes    Partners: Male    Birth  control/protection: None  Other Topics Concern   Not on file  Social History Narrative   Not on file   Social Determinants of Health   Financial Resource Strain: Not on file  Food Insecurity: Not on file  Transportation Needs: Not on file  Physical Activity: Not on file  Stress: Not on file  Social Connections: Not on file  Intimate Partner Violence: Not on file    Past Medical History, Surgical history, Social history, and Family history were reviewed and updated as appropriate.   Please see review of systems for further details on the patient's review from today.   Objective:   Physical Exam:  LMP 09/02/2015 (Approximate) Comment: continous bleeding  Physical Exam Constitutional:      General: She is not in acute distress. Musculoskeletal:        General: No deformity.  Neurological:     Mental Status: She is alert and oriented to person, place, and time.     Coordination: Coordination normal.  Psychiatric:        Attention and Perception: Attention and perception normal. She does not perceive auditory or visual hallucinations.        Mood and Affect: Mood normal. Mood is not anxious or depressed. Affect is not labile, blunt, angry or inappropriate.         Speech: Speech normal.        Behavior: Behavior normal.        Thought Content: Thought content normal. Thought content is not paranoid or delusional. Thought content does not include homicidal or suicidal ideation. Thought content does not include homicidal or suicidal plan.        Cognition and Memory: Cognition and memory normal.        Judgment: Judgment normal.     Comments: Insight intact     Lab Review:     Component Value Date/Time   NA 139 07/14/2021 1503   K 4.3 07/14/2021 1503   CL 101 07/14/2021 1503   CO2 31 07/14/2021 1503   GLUCOSE 111 (H) 07/14/2021 1503   BUN 6 07/14/2021 1503   CREATININE 0.76 07/14/2021 1503   CREATININE 0.79 09/24/2013 1544   CALCIUM 9.8 07/14/2021 1503   PROT 7.6 07/14/2021 1503   ALBUMIN 4.6 07/14/2021 1503   AST 23 07/14/2021 1503   ALT 34 07/14/2021 1503   ALKPHOS 144 (H) 07/14/2021 1503   BILITOT 0.4 07/14/2021 1503   GFRNONAA >60 01/17/2018 0128   GFRAA >60 01/17/2018 0128       Component Value Date/Time   WBC 6.5 09/19/2020 1632   RBC 4.42 09/19/2020 1632   HGB 13.4 09/19/2020 1632   HCT 40.0 09/19/2020 1632   PLT 330.0 09/19/2020 1632   MCV 90.5 09/19/2020 1632   MCH 30.4 01/17/2018 0128   MCHC 33.5 09/19/2020 1632   RDW 14.9 09/19/2020 1632   LYMPHSABS 2.7 03/07/2012 1556   MONOABS 0.6 03/07/2012 1556   EOSABS 0.1 03/07/2012 1556   BASOSABS 0.0 03/07/2012 1556    No results found for: "POCLITH", "LITHIUM"   No results found for: "PHENYTOIN", "PHENOBARB", "VALPROATE", "CBMZ"   .res Assessment: Plan:    Plan:  PDMP reviewed  1. Buspar  TID 2. Effexor XR  daily   3. Xanax  BID  Therapist - [name]  RTC 3 months  Patient advised to contact office with any questions, adverse effects, or acute worsening in signs and symptoms.  Discussed potential benefits, risk, and side effects of benzodiazepines to include  potential risk of tolerance and dependence, as well as possible drowsiness.   Advised patient not to drive if experiencing drowsiness and to take lowest possible effective dose to minimize risk of dependence and tolerance.  Discussed potential metabolic side effects associated with atypical antipsychotics, as well as potential risk for movement side effects. Advised pt to contact office if movement side effects occur.   Diagnoses and all orders for this visit:  Major depressive disorder, recurrent episode, moderate  Generalized anxiety disorder -     ALPRAZolam (XANAX) 1 MG tablet; Take one tablet twice daily.  Panic attacks  Insomnia, unspecified type     Please see After Visit Summary for patient specific instructions.  Future Appointments  Date Time Provider Department Center  07/29/2022  1:20 PM Bradd Canary, MD [id number]    No orders of the defined types were placed in this encounter.     -------------------------------

## 2022-06-01 ENCOUNTER — Other Ambulatory Visit: Payer: Self-pay | Admitting: Family Medicine

## 2022-06-15 DIAGNOSIS — M9903 Segmental and somatic dysfunction of lumbar region: Secondary | ICD-10-CM | POA: Diagnosis not present

## 2022-06-15 DIAGNOSIS — M5116 Intervertebral disc disorders with radiculopathy, lumbar region: Secondary | ICD-10-CM | POA: Diagnosis not present

## 2022-06-15 DIAGNOSIS — M50122 Cervical disc disorder at C5-C6 level with radiculopathy: Secondary | ICD-10-CM | POA: Diagnosis not present

## 2022-06-15 DIAGNOSIS — M9901 Segmental and somatic dysfunction of cervical region: Secondary | ICD-10-CM | POA: Diagnosis not present

## 2022-06-17 DIAGNOSIS — M9901 Segmental and somatic dysfunction of cervical region: Secondary | ICD-10-CM | POA: Diagnosis not present

## 2022-06-17 DIAGNOSIS — M9903 Segmental and somatic dysfunction of lumbar region: Secondary | ICD-10-CM | POA: Diagnosis not present

## 2022-06-17 DIAGNOSIS — M5116 Intervertebral disc disorders with radiculopathy, lumbar region: Secondary | ICD-10-CM | POA: Diagnosis not present

## 2022-06-17 DIAGNOSIS — M50122 Cervical disc disorder at C5-C6 level with radiculopathy: Secondary | ICD-10-CM | POA: Diagnosis not present

## 2022-06-21 DIAGNOSIS — M9903 Segmental and somatic dysfunction of lumbar region: Secondary | ICD-10-CM | POA: Diagnosis not present

## 2022-06-21 DIAGNOSIS — M9901 Segmental and somatic dysfunction of cervical region: Secondary | ICD-10-CM | POA: Diagnosis not present

## 2022-06-21 DIAGNOSIS — M5116 Intervertebral disc disorders with radiculopathy, lumbar region: Secondary | ICD-10-CM | POA: Diagnosis not present

## 2022-06-21 DIAGNOSIS — M50122 Cervical disc disorder at C5-C6 level with radiculopathy: Secondary | ICD-10-CM | POA: Diagnosis not present

## 2022-06-23 ENCOUNTER — Encounter: Payer: Self-pay | Admitting: Family Medicine

## 2022-06-23 DIAGNOSIS — M50122 Cervical disc disorder at C5-C6 level with radiculopathy: Secondary | ICD-10-CM | POA: Diagnosis not present

## 2022-06-23 DIAGNOSIS — M9903 Segmental and somatic dysfunction of lumbar region: Secondary | ICD-10-CM | POA: Diagnosis not present

## 2022-06-23 DIAGNOSIS — M9901 Segmental and somatic dysfunction of cervical region: Secondary | ICD-10-CM | POA: Diagnosis not present

## 2022-06-23 DIAGNOSIS — M5116 Intervertebral disc disorders with radiculopathy, lumbar region: Secondary | ICD-10-CM | POA: Diagnosis not present

## 2022-06-24 ENCOUNTER — Other Ambulatory Visit: Payer: Self-pay | Admitting: Family Medicine

## 2022-06-24 DIAGNOSIS — M9901 Segmental and somatic dysfunction of cervical region: Secondary | ICD-10-CM | POA: Diagnosis not present

## 2022-06-24 DIAGNOSIS — M9903 Segmental and somatic dysfunction of lumbar region: Secondary | ICD-10-CM | POA: Diagnosis not present

## 2022-06-24 DIAGNOSIS — M5116 Intervertebral disc disorders with radiculopathy, lumbar region: Secondary | ICD-10-CM | POA: Diagnosis not present

## 2022-06-24 DIAGNOSIS — M50122 Cervical disc disorder at C5-C6 level with radiculopathy: Secondary | ICD-10-CM | POA: Diagnosis not present

## 2022-06-24 MED ORDER — ONDANSETRON HCL 4 MG PO TABS: 4.0000 mg | ORAL_TABLET | Freq: Three times a day (TID) | ORAL | 1 refills | Status: DC | PRN

## 2022-06-29 DIAGNOSIS — M50122 Cervical disc disorder at C5-C6 level with radiculopathy: Secondary | ICD-10-CM | POA: Diagnosis not present

## 2022-06-29 DIAGNOSIS — M5116 Intervertebral disc disorders with radiculopathy, lumbar region: Secondary | ICD-10-CM | POA: Diagnosis not present

## 2022-06-29 DIAGNOSIS — M9903 Segmental and somatic dysfunction of lumbar region: Secondary | ICD-10-CM | POA: Diagnosis not present

## 2022-06-29 DIAGNOSIS — M9901 Segmental and somatic dysfunction of cervical region: Secondary | ICD-10-CM | POA: Diagnosis not present

## 2022-06-30 DIAGNOSIS — M9901 Segmental and somatic dysfunction of cervical region: Secondary | ICD-10-CM | POA: Diagnosis not present

## 2022-06-30 DIAGNOSIS — M50122 Cervical disc disorder at C5-C6 level with radiculopathy: Secondary | ICD-10-CM | POA: Diagnosis not present

## 2022-06-30 DIAGNOSIS — M5116 Intervertebral disc disorders with radiculopathy, lumbar region: Secondary | ICD-10-CM | POA: Diagnosis not present

## 2022-06-30 DIAGNOSIS — M9903 Segmental and somatic dysfunction of lumbar region: Secondary | ICD-10-CM | POA: Diagnosis not present

## 2022-07-01 DIAGNOSIS — M5116 Intervertebral disc disorders with radiculopathy, lumbar region: Secondary | ICD-10-CM | POA: Diagnosis not present

## 2022-07-01 DIAGNOSIS — M9903 Segmental and somatic dysfunction of lumbar region: Secondary | ICD-10-CM | POA: Diagnosis not present

## 2022-07-01 DIAGNOSIS — M9901 Segmental and somatic dysfunction of cervical region: Secondary | ICD-10-CM | POA: Diagnosis not present

## 2022-07-01 DIAGNOSIS — M50122 Cervical disc disorder at C5-C6 level with radiculopathy: Secondary | ICD-10-CM | POA: Diagnosis not present

## 2022-07-06 DIAGNOSIS — M9903 Segmental and somatic dysfunction of lumbar region: Secondary | ICD-10-CM | POA: Diagnosis not present

## 2022-07-06 DIAGNOSIS — M9901 Segmental and somatic dysfunction of cervical region: Secondary | ICD-10-CM | POA: Diagnosis not present

## 2022-07-06 DIAGNOSIS — M5116 Intervertebral disc disorders with radiculopathy, lumbar region: Secondary | ICD-10-CM | POA: Diagnosis not present

## 2022-07-06 DIAGNOSIS — M50122 Cervical disc disorder at C5-C6 level with radiculopathy: Secondary | ICD-10-CM | POA: Diagnosis not present

## 2022-07-07 DIAGNOSIS — M9901 Segmental and somatic dysfunction of cervical region: Secondary | ICD-10-CM | POA: Diagnosis not present

## 2022-07-07 DIAGNOSIS — M9903 Segmental and somatic dysfunction of lumbar region: Secondary | ICD-10-CM | POA: Diagnosis not present

## 2022-07-07 DIAGNOSIS — M5116 Intervertebral disc disorders with radiculopathy, lumbar region: Secondary | ICD-10-CM | POA: Diagnosis not present

## 2022-07-07 DIAGNOSIS — M50122 Cervical disc disorder at C5-C6 level with radiculopathy: Secondary | ICD-10-CM | POA: Diagnosis not present

## 2022-07-08 DIAGNOSIS — M9903 Segmental and somatic dysfunction of lumbar region: Secondary | ICD-10-CM | POA: Diagnosis not present

## 2022-07-08 DIAGNOSIS — M9901 Segmental and somatic dysfunction of cervical region: Secondary | ICD-10-CM | POA: Diagnosis not present

## 2022-07-08 DIAGNOSIS — M5116 Intervertebral disc disorders with radiculopathy, lumbar region: Secondary | ICD-10-CM | POA: Diagnosis not present

## 2022-07-08 DIAGNOSIS — M50122 Cervical disc disorder at C5-C6 level with radiculopathy: Secondary | ICD-10-CM | POA: Diagnosis not present

## 2022-07-09 DIAGNOSIS — F332 Major depressive disorder, recurrent severe without psychotic features: Secondary | ICD-10-CM | POA: Diagnosis not present

## 2022-07-13 DIAGNOSIS — M9901 Segmental and somatic dysfunction of cervical region: Secondary | ICD-10-CM | POA: Diagnosis not present

## 2022-07-13 DIAGNOSIS — M9903 Segmental and somatic dysfunction of lumbar region: Secondary | ICD-10-CM | POA: Diagnosis not present

## 2022-07-13 DIAGNOSIS — M50122 Cervical disc disorder at C5-C6 level with radiculopathy: Secondary | ICD-10-CM | POA: Diagnosis not present

## 2022-07-13 DIAGNOSIS — M5116 Intervertebral disc disorders with radiculopathy, lumbar region: Secondary | ICD-10-CM | POA: Diagnosis not present

## 2022-07-14 DIAGNOSIS — M50122 Cervical disc disorder at C5-C6 level with radiculopathy: Secondary | ICD-10-CM | POA: Diagnosis not present

## 2022-07-14 DIAGNOSIS — M9903 Segmental and somatic dysfunction of lumbar region: Secondary | ICD-10-CM | POA: Diagnosis not present

## 2022-07-14 DIAGNOSIS — M5116 Intervertebral disc disorders with radiculopathy, lumbar region: Secondary | ICD-10-CM | POA: Diagnosis not present

## 2022-07-14 DIAGNOSIS — M9901 Segmental and somatic dysfunction of cervical region: Secondary | ICD-10-CM | POA: Diagnosis not present

## 2022-07-16 DIAGNOSIS — F332 Major depressive disorder, recurrent severe without psychotic features: Secondary | ICD-10-CM | POA: Diagnosis not present

## 2022-07-18 ENCOUNTER — Other Ambulatory Visit: Payer: Self-pay | Admitting: Family Medicine

## 2022-07-20 DIAGNOSIS — M9903 Segmental and somatic dysfunction of lumbar region: Secondary | ICD-10-CM | POA: Diagnosis not present

## 2022-07-20 DIAGNOSIS — M50122 Cervical disc disorder at C5-C6 level with radiculopathy: Secondary | ICD-10-CM | POA: Diagnosis not present

## 2022-07-20 DIAGNOSIS — M5116 Intervertebral disc disorders with radiculopathy, lumbar region: Secondary | ICD-10-CM | POA: Diagnosis not present

## 2022-07-20 DIAGNOSIS — M9901 Segmental and somatic dysfunction of cervical region: Secondary | ICD-10-CM | POA: Diagnosis not present

## 2022-07-21 DIAGNOSIS — M9901 Segmental and somatic dysfunction of cervical region: Secondary | ICD-10-CM | POA: Diagnosis not present

## 2022-07-21 DIAGNOSIS — M50122 Cervical disc disorder at C5-C6 level with radiculopathy: Secondary | ICD-10-CM | POA: Diagnosis not present

## 2022-07-21 DIAGNOSIS — M9903 Segmental and somatic dysfunction of lumbar region: Secondary | ICD-10-CM | POA: Diagnosis not present

## 2022-07-21 DIAGNOSIS — M5116 Intervertebral disc disorders with radiculopathy, lumbar region: Secondary | ICD-10-CM | POA: Diagnosis not present

## 2022-07-27 ENCOUNTER — Other Ambulatory Visit (INDEPENDENT_AMBULATORY_CARE_PROVIDER_SITE_OTHER): Payer: 59

## 2022-07-27 DIAGNOSIS — R739 Hyperglycemia, unspecified: Secondary | ICD-10-CM | POA: Diagnosis not present

## 2022-07-27 DIAGNOSIS — I10 Essential (primary) hypertension: Secondary | ICD-10-CM

## 2022-07-27 DIAGNOSIS — E782 Mixed hyperlipidemia: Secondary | ICD-10-CM

## 2022-07-27 LAB — LIPID PANEL
Cholesterol: 170 mg/dL (ref 0–200)
HDL: 46.9 mg/dL (ref >39.00)
LDL Cholesterol: 99 mg/dL (ref 0–99)
NonHDL: 122.89
Total CHOL/HDL Ratio: 4
Triglycerides: 118 mg/dL (ref 0.0–149.0)
VLDL: 23.6 mg/dL (ref 0.0–40.0)

## 2022-07-27 LAB — COMPREHENSIVE METABOLIC PANEL
ALT: 36 U/L — ABNORMAL HIGH (ref 0–35)
AST: 27 U/L (ref 0–37)
Albumin: 4.5 g/dL (ref 3.5–5.2)
Alkaline Phosphatase: 153 U/L — ABNORMAL HIGH (ref 39–117)
BUN: 7 mg/dL (ref 6–23)
CO2: 30 mEq/L (ref 19–32)
Calcium: 9.4 mg/dL (ref 8.4–10.5)
Chloride: 102 mEq/L (ref 96–112)
Creatinine, Ser: 0.76 mg/dL (ref 0.40–1.20)
GFR: 89.3 mL/min (ref >60.00)
Glucose, Bld: 105 mg/dL — ABNORMAL HIGH (ref 70–99)
Potassium: 3.6 mEq/L (ref 3.5–5.1)
Sodium: 140 mEq/L (ref 135–145)
Total Bilirubin: 0.4 mg/dL (ref 0.2–1.2)
Total Protein: 7.6 g/dL (ref 6.0–8.3)

## 2022-07-27 LAB — CBC WITH DIFFERENTIAL/PLATELET
Basophils Absolute: 0 10*3/uL (ref 0.0–0.1)
Basophils Relative: 0.5 % (ref 0.0–3.0)
Eosinophils Absolute: 0.2 10*3/uL (ref 0.0–0.7)
Eosinophils Relative: 2.5 % (ref 0.0–5.0)
HCT: 41.6 % (ref 36.0–46.0)
Hemoglobin: 13.7 g/dL (ref 12.0–15.0)
Lymphocytes Relative: 36.5 % (ref 12.0–46.0)
Lymphs Abs: 2.8 10*3/uL (ref 0.7–4.0)
MCHC: 32.8 g/dL (ref 30.0–36.0)
MCV: 91.2 fl (ref 78.0–100.0)
Monocytes Absolute: 0.5 10*3/uL (ref 0.1–1.0)
Monocytes Relative: 6.8 % (ref 3.0–12.0)
Neutro Abs: 4.1 10*3/uL (ref 1.4–7.7)
Neutrophils Relative %: 53.7 % (ref 43.0–77.0)
Platelets: 330 10*3/uL (ref 150.0–400.0)
RBC: 4.56 Mil/uL (ref 3.87–5.11)
RDW: 14.4 % (ref 11.5–15.5)
WBC: 7.6 10*3/uL (ref 4.0–10.5)

## 2022-07-27 LAB — TSH: TSH: 0.39 u[IU]/mL (ref 0.35–5.50)

## 2022-07-27 LAB — HEMOGLOBIN A1C: Hgb A1c MFr Bld: 6.2 % (ref 4.6–6.5)

## 2022-07-28 NOTE — Assessment & Plan Note (Signed)
Improved some with increased hydration and sleep and stopping artificial sweeteners. Still occurs some but improved.  

## 2022-07-28 NOTE — Assessment & Plan Note (Signed)
On Levothyroxine, continue to monitor 

## 2022-07-28 NOTE — Assessment & Plan Note (Signed)
hgba1c acceptable, minimize simple carbs. Increase exercise as tolerated. Continue current meds 

## 2022-07-28 NOTE — Assessment & Plan Note (Signed)
Well controlled, no changes to meds. Encouraged heart healthy diet such as the DASH diet and exercise as tolerated.  °

## 2022-07-28 NOTE — Assessment & Plan Note (Signed)
RRR 

## 2022-07-29 ENCOUNTER — Encounter: Payer: Self-pay | Admitting: Family Medicine

## 2022-07-29 ENCOUNTER — Ambulatory Visit (INDEPENDENT_AMBULATORY_CARE_PROVIDER_SITE_OTHER): Payer: 59 | Admitting: Family Medicine

## 2022-07-29 VITALS — BP 136/80 | HR 86 | Temp 97.8°F | Resp 16 | Ht 59.0 in | Wt 169.8 lb

## 2022-07-29 DIAGNOSIS — Z0000 Encounter for general adult medical examination without abnormal findings: Secondary | ICD-10-CM

## 2022-07-29 DIAGNOSIS — R0789 Other chest pain: Secondary | ICD-10-CM | POA: Diagnosis not present

## 2022-07-29 DIAGNOSIS — R002 Palpitations: Secondary | ICD-10-CM | POA: Diagnosis not present

## 2022-07-29 DIAGNOSIS — R519 Headache, unspecified: Secondary | ICD-10-CM | POA: Diagnosis not present

## 2022-07-29 DIAGNOSIS — C539 Malignant neoplasm of cervix uteri, unspecified: Secondary | ICD-10-CM

## 2022-07-29 DIAGNOSIS — R000 Tachycardia, unspecified: Secondary | ICD-10-CM | POA: Diagnosis not present

## 2022-07-29 DIAGNOSIS — M542 Cervicalgia: Secondary | ICD-10-CM

## 2022-07-29 DIAGNOSIS — R739 Hyperglycemia, unspecified: Secondary | ICD-10-CM

## 2022-07-29 DIAGNOSIS — E039 Hypothyroidism, unspecified: Secondary | ICD-10-CM

## 2022-07-29 DIAGNOSIS — M5030 Other cervical disc degeneration, unspecified cervical region: Secondary | ICD-10-CM

## 2022-07-29 DIAGNOSIS — E782 Mixed hyperlipidemia: Secondary | ICD-10-CM

## 2022-07-29 DIAGNOSIS — I10 Essential (primary) hypertension: Secondary | ICD-10-CM

## 2022-07-29 DIAGNOSIS — Z1211 Encounter for screening for malignant neoplasm of colon: Secondary | ICD-10-CM

## 2022-07-29 DIAGNOSIS — F32A Depression, unspecified: Secondary | ICD-10-CM

## 2022-07-29 NOTE — Assessment & Plan Note (Addendum)
Patient encouraged to maintain heart healthy diet, regular exercise, adequate sleep. Consider daily probiotics. Take medications as prescribed. Labs reviewed and order. Given and reviewed copy of ACP documents from [employer] and encouraged to complete and return. MGM 02/2022 reperat next year Colonoscopy referred for colonoscopy again with gastroenterology 2017 pap referred to OB/GYN practive for evaluation

## 2022-07-29 NOTE — Patient Instructions (Addendum)
Shingrix is the new shingles shot, 2 shots over 2-6 months, confirm coverage with insurance and document, then can return here for shots with nurse appt or at pharmacy   Tetanus due 2028 or sooner if injured  Ideally check bp and pulse at rest every other and keep a log <140/90   Preventive Care 41-54 Years Old, Female Preventive care refers to lifestyle choices and visits with your health care provider that can promote health and wellness. Preventive care visits are also called wellness exams. What can I expect for my preventive care visit? Counseling Your health care provider may ask you questions about your: Medical history, including: Past medical problems. Family medical history. Pregnancy history. Current health, including: Menstrual cycle. Method of birth control. Emotional well-being. Home life and relationship well-being. Sexual activity and sexual health. Lifestyle, including: Alcohol, nicotine or tobacco, and drug use. Access to firearms. Diet, exercise, and sleep habits. Work and work Astronomer. Sunscreen use. Safety issues such as seatbelt and bike helmet use. Physical exam Your health care provider will check your: Height and weight. These may be used to calculate your BMI (body mass index). BMI is a measurement that tells if you are at a healthy weight. Waist circumference. This measures the distance around your waistline. This measurement also tells if you are at a healthy weight and may help predict your risk of certain diseases, such as type 2 diabetes and high blood pressure. Heart rate and blood pressure. Body temperature. Skin for abnormal spots. What immunizations do I need?  Vaccines are usually given at various ages, according to a schedule. Your health care provider will recommend vaccines for you based on your age, medical history, and lifestyle or other factors, such as travel or where you work. What tests do I need? Screening Your health care  provider may recommend screening tests for certain conditions. This may include: Lipid and cholesterol levels. Diabetes screening. This is done by checking your blood sugar (glucose) after you have not eaten for a while (fasting). Pelvic exam and Pap test. Hepatitis B test. Hepatitis C test. HIV (human immunodeficiency virus) test. STI (sexually transmitted infection) testing, if you are at risk. Lung cancer screening. Colorectal cancer screening. Mammogram. Talk with your health care provider about when you should start having regular mammograms. This may depend on whether you have a family history of breast cancer. BRCA-related cancer screening. This may be done if you have a family history of breast, ovarian, tubal, or peritoneal cancers. Bone density scan. This is done to screen for osteoporosis. Talk with your health care provider about your test results, treatment options, and if necessary, the need for more tests. Follow these instructions at home: Eating and drinking  Eat a diet that includes fresh fruits and vegetables, whole grains, lean protein, and low-fat dairy products. Take vitamin and mineral supplements as recommended by your health care provider. Do not drink alcohol if: Your health care provider tells you not to drink. You are pregnant, may be pregnant, or are planning to become pregnant. If you drink alcohol: Limit how much you have to 0-1 drink a day. Know how much alcohol is in your drink. In the [place] one drink equals one 12 oz bottle of beer (355 mL), one 5 oz glass of wine (148 mL), or one 1 oz glass of hard liquor (44 mL). Lifestyle Brush your teeth every morning and night with fluoride toothpaste. Floss one time each day. Exercise for at least 30 minutes 5 or more days  each week. Do not use any products that contain nicotine or tobacco. These products include cigarettes, chewing tobacco, and vaping devices, such as e-cigarettes. If you need help quitting, ask  your health care provider. Do not use drugs. If you are sexually active, practice safe sex. Use a condom or other form of protection to prevent STIs. If you do not wish to become pregnant, use a form of birth control. If you plan to become pregnant, see your health care provider for a prepregnancy visit. Take aspirin only as told by your health care provider. Make sure that you understand how much to take and what form to take. Work with your health care provider to find out whether it is safe and beneficial for you to take aspirin daily. Find healthy ways to manage stress, such as: Meditation, yoga, or listening to music. Journaling. Talking to a trusted person. Spending time with friends and family. Minimize exposure to UV radiation to reduce your risk of skin cancer. Safety Always wear your seat belt while driving or riding in a vehicle. Do not drive: If you have been drinking alcohol. Do not ride with someone who has been drinking. When you are tired or distracted. While texting. If you have been using any mind-altering substances or drugs. Wear a helmet and other protective equipment during sports activities. If you have firearms in your house, make sure you follow all gun safety procedures. Seek help if you have been physically or sexually abused. What's next? Visit your health care provider once a year for an annual wellness visit. Ask your health care provider how often you should have your eyes and teeth checked. Stay up to date on all vaccines. This information is not intended to replace advice given to you by your health care provider. Make sure you discuss any questions you have with your health care provider. Document Revised: 07/16/2020 Document Reviewed: 07/16/2020 Elsevier Patient Education  2024 [employer].

## 2022-08-01 ENCOUNTER — Encounter: Payer: Self-pay | Admitting: Family Medicine

## 2022-08-01 DIAGNOSIS — R0789 Other chest pain: Secondary | ICD-10-CM | POA: Insufficient documentation

## 2022-08-01 NOTE — Assessment & Plan Note (Signed)
With weakness numbness in lue, proceed with xray and referral to ortho surgery for consideration

## 2022-08-01 NOTE — Assessment & Plan Note (Signed)
Following with counselor and psychiatry and they manage her meds. She will discuss with them and feels she is managing adequately while acknowledging her stress levels remain elevated

## 2022-08-01 NOTE — Progress Notes (Signed)
Subjective:    Patient ID: Lauren Hall, female    DOB: 1971-01-04, 52 y.o.   MRN: [id number]  Chief Complaint  Patient presents with   Annual Exam    Annual Exam    HPI Discussed the use of AI scribe software for clinical note transcription with the patient, who gave verbal consent to proceed. Patient is a 52 yo female in today for annual preventative exam and follow up on chronic concerns. Denies CP/palp/SOB/HA/congestion/fevers/GI or GU c/o. Taking meds as prescribed  History of Present Illness   The patient, with a history of fibromyalgia, hypertension, and COVID-19 infection, presents with multiple complaints. The patient reports persistent pain, which she describes as being present at all times, varying in intensity. The pain is described as being widespread, affecting the entire body, and is associated with significant fatigue and weakness. The patient describes feeling "tired and worn out" and compares her state to being "like a dish rag." The patient also reports difficulty in performing daily activities due to the pain and fatigue.  The patient also reports numbness in the hand, which has been progressing over the past week. The numbness started in the index finger and has now spread to the middle finger, thumb, and half of the hand. The patient reports difficulty in picking up pills due to the numbness.  The patient is also dealing with significant stress and anxiety. The patient's daughter has ADHD and is under psychiatric care, which adds to the patient's stress. The patient is also dealing with the loss of loved ones, which has further exacerbated her stress and anxiety levels.  The patient also has a history of COVID-19 infection and reports feeling fully recovered, except for residual weakness, which she attributes to her fibromyalgia. The patient also has hypertension and is pre-diabetic, with a slightly elevated A1c level.        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   Complication of anesthesia    patient woke up while they were sewing up her incision with breast lumpectomy   Depression    Dysrhythmia    Tachycardia   Enlarged thyroid    Fatigue 10/08/2010   Fibromyalgia 2009   GERD (gastroesophageal reflux disease) 09/26/2013   Headache(784.0) 10/08/2010   Migraines   Hip pain, bilateral 11/06/2010   Hyperglycemia 08/11/2015   normal Hgb A 1C per patient   Hypertension    Hypothyroidism 10/08/2010   Low back pain radiating to right leg 03/11/2015   Other and unspecified hyperlipidemia 07/29/2012   Peripheral neuropathy 11/11/2012   burning sensation mainly right leg   Poor concentration 01/11/2011   Preventative health care 07/29/2012   S/P laparoscopic hysterectomy 10/07/2015   Sinusitis, acute 07/27/2011   Tachycardia    Thyroid disease 10/08/2010   Tick bite of flank 11/09/2011   Tinea corporis 11/11/2012   Uterine leiomyoma     Past Surgical History:  Procedure Laterality Date   BREAST LUMPECTOMY Right 1999   benign    CYSTOSCOPY N/A 10/07/2015   Procedure: CYSTOSCOPY;  Surgeon: Essie Hart, MD;  Location: [facility];  Service: Gynecology;  Laterality: N/A;    Family History  Problem Relation Age of Onset   Fibromyalgia Mother    Diabetes Mother        type 2   Hypertension Mother    Hyperlipidemia  Mother    Arthritis Mother    Alcohol abuse Father    Colon cancer Father    Hypertension Brother    Alcohol abuse Brother    Heart disease Maternal Grandmother    Hyperlipidemia Maternal Grandmother    Diabetes Maternal Grandmother        type 2   Colon cancer Maternal Grandmother    Stroke Maternal Grandfather    Diabetes Maternal Grandfather        type 2   Ovarian cancer Paternal Grandmother    Alzheimer's disease Paternal Grandfather     Social History   Socioeconomic History   Marital status:  Married    Spouse name: Derrick   Number of children: Not on file   Years of education: Not on file   Highest education level: Not on file  Occupational History   Not on file  Tobacco Use   Smoking status: Never   Smokeless tobacco: Never  Substance and Sexual Activity   Alcohol use: Yes    Comment: special occassion- very rarely   Drug use: No   Sexual activity: Yes    Partners: Male    Birth control/protection: None  Other Topics Concern   Not on file  Social History Narrative   Not on file   Social Determinants of Health   Financial Resource Strain: Not on file  Food Insecurity: Not on file  Transportation Needs: Not on file  Physical Activity: Not on file  Stress: Not on file  Social Connections: Not on file  Intimate Partner Violence: Not on file    Outpatient Medications Prior to Visit  Medication Sig Dispense Refill   ALPRAZolam (XANAX) 1 MG tablet Take one tablet twice daily. 60 tablet 2   atorvastatin (LIPITOR) 10 MG tablet Take 0.5 tablets (5 mg total) by mouth daily. 15 tablet 5   busPIRone (BUSPAR) 5 MG tablet TAKE 1 TABLET(5 MG) BY MOUTH THREE TIMES DAILY 270 tablet 3   cyclobenzaprine (FLEXERIL) 10 MG tablet Take 0.5-1 tablets (5-10 mg total) by mouth 3 (three) times daily as needed for muscle spasms. 90 tablet 0   gabapentin (NEURONTIN) 300 MG capsule TAKE 1 CAPSULE BY MOUTH TWICE A DAY AND 2 CAPSULES AT BEDTIME 360 capsule 1   levothyroxine (SYNTHROID) 25 MCG tablet TAKE 1 TABLET BY MOUTH EVERY DAY BEFORE BREAKFAST 30 tablet 2   lisinopril (ZESTRIL) 10 MG tablet Take 1 tablet (10 mg total) by mouth in the morning and at bedtime. 180 tablet 0   LOW-DOSE ASPIRIN PO      omeprazole (PRILOSEC) 40 MG capsule Take 1 capsule (40 mg total) by mouth daily. 90 capsule 0   ondansetron (ZOFRAN) 4 MG tablet Take 1 tablet (4 mg total) by mouth every 8 (eight) hours as needed for nausea or vomiting. 30 tablet 1   venlafaxine XR (EFFEXOR-XR) 150 MG 24 hr capsule Take one  capsule every morning. 90 capsule 3   amoxicillin-clavulanate (AUGMENTIN) 875-125 MG tablet Take 1 tablet by mouth 2 (two) times daily. 20 tablet 0   benzonatate (TESSALON PERLES) 100 MG capsule Take 1-2 capsules (100-200 mg total) by mouth 3 (three) times daily as needed for cough. 40 capsule 0   guaiFENesin-codeine 100-10 MG/5ML syrup Take 5 mLs by mouth every 6 (six) hours as needed for cough. 180 mL 0   No facility-administered medications prior to visit.    Allergies  Allergen Reactions   Promethazine Anaphylaxis   Promethazine Hcl Anaphylaxis    Review  of Systems  Constitutional:  Positive for malaise/fatigue. Negative for chills and fever.  HENT:  Negative for congestion and hearing loss.   Eyes:  Negative for discharge.  Respiratory:  Negative for cough, sputum production and shortness of breath.   Cardiovascular:  Positive for chest pain and palpitations. Negative for leg swelling.  Gastrointestinal:  Negative for abdominal pain, blood in stool, constipation, diarrhea, heartburn, nausea and vomiting.  Genitourinary:  Negative for dysuria, frequency, hematuria and urgency.  Musculoskeletal:  Negative for back pain, falls and myalgias.  Skin:  Negative for rash.  Neurological:  Positive for headaches. Negative for dizziness, sensory change, loss of consciousness and weakness.  Endo/Heme/Allergies:  Negative for environmental allergies. Does not bruise/bleed easily.  Psychiatric/Behavioral:  Positive for depression. Negative for suicidal ideas. The patient is nervous/anxious and has insomnia.        Objective:    Physical Exam  BP 136/80 (BP Location: Right Arm, Patient Position: Sitting, Cuff Size: Normal)   Pulse 86   Temp 97.8 F (36.6 C) (Oral)   Resp 16   Ht 4\' 11"  (1.499 m)   Wt 169 lb 12.8 oz (77 kg)   LMP 09/02/2015 (Approximate) Comment: continous bleeding  SpO2 96%   BMI 34.30 kg/m  Wt Readings from Last 3 Encounters:  07/29/22 169 lb 12.8 oz (77 kg)   03/30/22 168 lb 3.2 oz (76.3 kg)  07/14/21 169 lb 3.2 oz (76.7 kg)    Diabetic Foot Exam - Simple   No data filed    Lab Results  Component Value Date   WBC 7.6 07/27/2022   HGB 13.7 07/27/2022   HCT 41.6 07/27/2022   PLT 330.0 07/27/2022   GLUCOSE 105 (H) 07/27/2022   CHOL 170 07/27/2022   TRIG 118.0 07/27/2022   HDL 46.90 07/27/2022   LDLDIRECT 146.0 09/19/2020   LDLCALC 99 07/27/2022   ALT 36 (H) 07/27/2022   AST 27 07/27/2022   NA 140 07/27/2022   K 3.6 07/27/2022   CL 102 07/27/2022   CREATININE 0.76 07/27/2022   BUN 7 07/27/2022   CO2 30 07/27/2022   TSH 0.39 07/27/2022   HGBA1C 6.2 07/27/2022    Lab Results  Component Value Date   TSH 0.39 07/27/2022   Lab Results  Component Value Date   WBC 7.6 07/27/2022   HGB 13.7 07/27/2022   HCT 41.6 07/27/2022   MCV 91.2 07/27/2022   PLT 330.0 07/27/2022   Lab Results  Component Value Date   NA 140 07/27/2022   K 3.6 07/27/2022   CO2 30 07/27/2022   GLUCOSE 105 (H) 07/27/2022   BUN 7 07/27/2022   CREATININE 0.76 07/27/2022   BILITOT 0.4 07/27/2022   ALKPHOS 153 (H) 07/27/2022   AST 27 07/27/2022   ALT 36 (H) 07/27/2022   PROT 7.6 07/27/2022   ALBUMIN 4.5 07/27/2022   CALCIUM 9.4 07/27/2022   ANIONGAP 12 01/17/2018   GFR 89.30 07/27/2022   Lab Results  Component Value Date   CHOL 170 07/27/2022   Lab Results  Component Value Date   HDL 46.90 07/27/2022   Lab Results  Component Value Date   LDLCALC 99 07/27/2022   Lab Results  Component Value Date   TRIG 118.0 07/27/2022   Lab Results  Component Value Date   CHOLHDL 4 07/27/2022   Lab Results  Component Value Date   HGBA1C 6.2 07/27/2022       Assessment & Plan:  Preventative health care Assessment & Plan: Patient encouraged  to maintain heart healthy diet, regular exercise, adequate sleep. Consider daily probiotics. Take medications as prescribed. Labs reviewed and order. Given and reviewed copy of ACP documents from [employer] and encouraged to complete and return. MGM 02/2022 reperat next year Colonoscopy referred for colonoscopy again with gastroenterology 2017 pap referred to OB/GYN practive for evaluation    Primary hypertension Assessment & Plan: Well controlled, no changes to meds. Encouraged heart healthy diet such as the DASH diet and exercise as tolerated.    Orders: -     CBC with Differential/Platelet; Future -     Comprehensive metabolic panel; Future -     TSH; Future -     Ambulatory referral to Cardiology  Nonintractable headache, unspecified chronicity pattern, unspecified headache type Assessment & Plan: Improved some with increased hydration and sleep and stopping artificial sweeteners. Still occurs some but improved.    Hypothyroidism, unspecified type Assessment & Plan: On Levothyroxine, continue to monitor  Orders: -     Lipid panel; Future -     Ambulatory referral to Cardiology  Tachycardia Assessment & Plan: RRR  Orders: -     Ambulatory referral to Cardiology  Mixed hyperlipidemia Assessment & Plan: Well controlled, no changes to meds. Encouraged heart healthy diet such as the DASH diet and exercise as tolerated.   Orders: -     Ambulatory referral to Cardiology  Hyperglycemia Assessment & Plan: hgba1c acceptable, minimize simple carbs. Increase exercise as tolerated. Continue current meds   Orders: -     Hemoglobin A1c; Future -     Ambulatory referral to Cardiology  Colon cancer screening -     Ambulatory referral to Gastroenterology  Malignant neoplasm of cervix, unspecified site [facility]) -     Ambulatory referral to Obstetrics / Gynecology  Neck pain on right side -     DG Cervical Spine Complete; Future -     Ambulatory referral to Orthopedic Surgery  Atypical chest pain Assessment & Plan: No pain in office and likely multifactorial but she is interested in ruling out any cardiac cuase as she juggles all of her psychosocial concerns.  Referred to cardiology for consideration. She will seek care if symptoms recur and do not resolve  Orders: -     Ambulatory referral to Cardiology  Palpitations -     Ambulatory referral to Cardiology  Depression, unspecified depression type Assessment & Plan: Following with counselor and psychiatry and they manage her meds. She will discuss with them and feels she is managing adequately while acknowledging her stress levels remain elevated   Degenerative cervical disc Assessment & Plan: With weakness numbness in lue, proceed with xray and referral to ortho surgery for consideration     Assessment and Plan    Fibromyalgia: Chronic widespread pain with varying degrees of severity. Stress exacerbates symptoms. -Continue current management plan.  Prediabetes: A1c slightly elevated, in the prediabetic range. Family history of diabetes. -Monitor carbohydrate intake. -Repeat A1c in 3-4 months.  Anxiety/Depression: Chronic stressors including recent bereavements and family member's health issues. Currently on Venlafaxine 150mg , Xanax 1mg , and Buspar (dose unknown). -Consider discussing with mental health provider about increasing Buspar dose.  Neuropathic pain: Rapidly progressing numbness and pain in right arm and hand. -Order cervical spine x-ray. -Refer to Orthopedics for further evaluation. -Consider increasing Flexeril dose at bedtime for next few days.  Hypertension: Resting heart rate consistently in the 90s. Blood pressure medication (Lisinopril 10mg ) may need adjustment. -Check blood pressure and pulse at rest  every other day and keep a log. -Consider increasing Lisinopril to 10mg  twice daily if blood pressure consistently above 140/90.  General Health Maintenance: -Refer for colonoscopy due to family history of colon cancer. -Refer for pelvic exam due to time since last exam. -Refer to Cardiology for evaluation of atypical chest pain and palpitations. -Administer  shingles vaccine. -Continue monitoring blood pressure and pulse at home. -Follow up in 3-4 months for repeat lab work.         Danise Edge, MD

## 2022-08-01 NOTE — Assessment & Plan Note (Signed)
No pain in office and likely multifactorial but she is interested in ruling out any cardiac cuase as she juggles all of her psychosocial concerns. Referred to cardiology for consideration. She will seek care if symptoms recur and do not resolve

## 2022-08-06 DIAGNOSIS — F332 Major depressive disorder, recurrent severe without psychotic features: Secondary | ICD-10-CM | POA: Diagnosis not present

## 2022-08-12 ENCOUNTER — Ambulatory Visit (INDEPENDENT_AMBULATORY_CARE_PROVIDER_SITE_OTHER): Payer: 59 | Admitting: Physical Medicine and Rehabilitation

## 2022-08-12 ENCOUNTER — Encounter: Payer: Self-pay | Admitting: Physical Medicine and Rehabilitation

## 2022-08-12 ENCOUNTER — Other Ambulatory Visit (INDEPENDENT_AMBULATORY_CARE_PROVIDER_SITE_OTHER): Payer: 59

## 2022-08-12 DIAGNOSIS — M542 Cervicalgia: Secondary | ICD-10-CM

## 2022-08-12 DIAGNOSIS — M7918 Myalgia, other site: Secondary | ICD-10-CM

## 2022-08-12 DIAGNOSIS — M5412 Radiculopathy, cervical region: Secondary | ICD-10-CM

## 2022-08-12 NOTE — Progress Notes (Addendum)
[name] - 52 y.o. female MRN [id number]  Date of birth: September 09, 1970  Office Visit Note: Visit Date: 08/12/2022 PCP: Bradd Canary, MD Referred by: Bradd Canary, MD  Subjective: Chief Complaint  Patient presents with   Neck - Pain   HPI: Lauren Hall is a 52 y.o. female who comes in today per the request of Dr. Danise Edge for evaluation of right sided neck pain radiating down right arm to fingers. Reports numbness to fingers. Pain ongoing for several years, worsens with movement and activity. She describes pain as dull, aching and tingling sensation, currently rates as 4 out of 10. Some relief of pain with rest and medications. History of multiple regimens of chiropractic treatments, most recent with [facility] with minimal relief of pain. Of note, patient states she was diagnosed with Chiari Malformation many years ago. Also reports history of fibromyalgia, states she has tried Cymbalta in the past and was unable to take. Patient was started on Gabapentin and Flexeril by PCP several months ago with minimal relief of pain. No history of prior cervical imaging. No history of cervical surgery/injections. Patient denies focal weakness. No recent trauma or falls.      Review of Systems  Musculoskeletal:  Positive for myalgias and neck pain.  Neurological:  Positive for tingling. Negative for sensory change, focal weakness and weakness.  All other systems reviewed and are negative.  Otherwise per HPI.  Assessment & Plan: Visit Diagnoses:    ICD-10-CM   1. Radiculopathy, cervical region  M54.12 XR Cervical Spine 2 or 3 views    2. Cervicalgia  M54.2 MR CERVICAL SPINE WO CONTRAST    3. Myofascial pain syndrome  M79.18        Plan: Findings:  Chronic, worsening and severe right sided neck pain radiating to shoulder, down arm to fingers. Patient continues to have severe pain despite good conservative therapies such as chiropractic treatments, home exercise  regimen, rest and use of medications. She has failed all conservative therapies including chiropractic treatments. Patients clinical presentation and exam are complex, differentials include cervical radiculopathy vs myofascial pain syndrome. I also feel her fibromyalgia is working to exacerbate her pain. I obtained 2 view cervical x-rays in the office today that show multi level spondylosis, most prominent at C4-C5 where there is disc height loss and biforaminal narrowing. Anterior bridging osteophytes noted. Next step is to obtain cervical MRI imaging. Depending on results of imaging we discussed possibility of perform cervical epidural steroid injection. I also feel she would benefit from re-grouping with our PT team, would recommend dry needling treatments. Patient instructed to continue fibromyalgia treatments with her PCP. Although we do not treat fibromyalgia directly, I did emphasize the importance of good sleep habits, stress reduction and consistent physical activity. No red flag symptoms noted upon exam today.     Meds & Orders: No orders of the defined types were placed in this encounter.   Orders Placed This Encounter  Procedures   XR Cervical Spine 2 or 3 views   MR CERVICAL SPINE WO CONTRAST    Follow-up: Return for Cervical MRI review.   Procedures: No procedures performed      Clinical History: No specialty comments available.   She reports that she has never smoked. She has never used smokeless tobacco.  Recent Labs    07/27/22 1558  HGBA1C 6.2    Objective:  VS:  HT:    WT:   BMI:     BP:  HR: bpm  TEMP: ( )  RESP:  Physical Exam Vitals and nursing note reviewed.  HENT:     Head: Normocephalic and atraumatic.     Right Ear: External ear normal.     Left Ear: External ear normal.     Nose: Nose normal.     Mouth/Throat:     Mouth: Mucous membranes are moist.  Eyes:     Extraocular Movements: Extraocular movements intact.  Cardiovascular:     Rate and  Rhythm: Normal rate.     Pulses: Normal pulses.  Pulmonary:     Effort: Pulmonary effort is normal.  Abdominal:     General: Abdomen is flat. There is no distension.  Musculoskeletal:        General: Tenderness present.     Cervical back: Normal range of motion.     Comments: No discomfort noted with flexion, extension and side-to-side rotation. Patient has good strength in the upper extremities including 5 out of 5 strength in wrist extension, long finger flexion and APB. Shoulder range of motion is full bilaterally without any sign of impingement. There is no atrophy of the hands intrinsically. Sensation intact bilaterally. Tenderness noted to right levator scapulae and trapezius muscles upon exam today. Negative Hoffman's sign. Negative Spurling's sign.     Skin:    General: Skin is warm and dry.     Capillary Refill: Capillary refill takes less than 2 seconds.  Neurological:     General: No focal deficit present.     Mental Status: She is alert and oriented to person, place, and time.  Psychiatric:        Mood and Affect: Mood normal.        Behavior: Behavior normal.     Ortho Exam  Imaging: 2 view cervical radiographs show increased kyphosis, multi level spondylosis, more prominent at C4-C5 where there is disc height loss and biforaminal narrowing. Multi level anterior bridging osteophytes notes. No fractures or dislocations noted.   Past Medical/Family/Surgical/Social History: Medications & Allergies reviewed per EMR, new medications updated. Patient Active Problem List   Diagnosis Date Noted   Atypical chest pain 08/01/2022   COVID-19 03/31/2022   Elevated liver enzymes 07/15/2021   RUQ pain 11/14/2020   Heartburn 11/14/2020   Uterine leiomyoma    Enlarged thyroid    Dysrhythmia    Complication of anesthesia    Anxiety    Allergy    Grief reaction 05/03/2019   Hot flashes 02/07/2017   Degenerative cervical disc 09/16/2016   Nonallopathic lesion of cervical region  09/16/2016   Nonallopathic lesion of thoracic region 09/16/2016   Nonallopathic lesion of lumbosacral region 09/16/2016   Cervical radicular pain 09/09/2016   S/P laparoscopic hysterectomy 10/07/2015   Hyperglycemia 08/11/2015   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   Low back pain radiating to right leg 03/11/2015   Pain in joint, lower leg 11/18/2013   Peripheral neuropathy 11/11/2012   Tinea corporis 11/11/2012   Hyperlipidemia 07/29/2012   Preventative health care 07/29/2012   Sinusitis, acute 07/27/2011   Bronchitis 07/27/2011   Tachycardia 03/02/2011   Poor concentration 01/11/2011   Hip pain, bilateral 11/06/2010   Fatigue 10/08/2010   Headache 10/08/2010   Hypothyroidism 10/08/2010   Thyroid disease 10/08/2010   Fibromyalgia    Anemia    Allergic state    Depression    Hypertension    Past Medical History:  Diagnosis Date   Allergy    Anemia    Anxiety  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   Bronchitis 07/27/2011   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   Complication of anesthesia    patient woke up while they were sewing up her incision with breast lumpectomy   Depression    Dysrhythmia    Tachycardia   Enlarged thyroid    Fatigue 10/08/2010   Fibromyalgia 2009   GERD (gastroesophageal reflux disease) 09/26/2013   Headache(784.0) 10/08/2010   Migraines   Hip pain, bilateral 11/06/2010   Hyperglycemia 08/11/2015   normal Hgb A 1C per patient   Hypertension    Hypothyroidism 10/08/2010   Low back pain radiating to right leg 03/11/2015   Other and unspecified hyperlipidemia 07/29/2012   Peripheral neuropathy 11/11/2012   burning sensation mainly right leg   Poor concentration 01/11/2011   Preventative health care 07/29/2012   S/P laparoscopic hysterectomy 10/07/2015   Sinusitis, acute 07/27/2011   Tachycardia    Thyroid disease 10/08/2010   Tick bite of flank 11/09/2011   Tinea corporis 11/11/2012   Uterine leiomyoma    Family History  Problem Relation Age of Onset    Fibromyalgia Mother    Diabetes Mother        type 2   Hypertension Mother    Hyperlipidemia Mother    Arthritis Mother    Alcohol abuse Father    Colon cancer Father    Hypertension Brother    Alcohol abuse Brother    Heart disease Maternal Grandmother    Hyperlipidemia Maternal Grandmother    Diabetes Maternal Grandmother        type 2   Colon cancer Maternal Grandmother    Stroke Maternal Grandfather    Diabetes Maternal Grandfather        type 2   Ovarian cancer Paternal Grandmother    Alzheimer's disease Paternal Grandfather    Past Surgical History:  Procedure Laterality Date   BREAST LUMPECTOMY Right 1999   benign    CYSTOSCOPY N/A 10/07/2015   Procedure: CYSTOSCOPY;  Surgeon: Essie Hart, MD;  Location: [facility];  Service: Gynecology;  Laterality: N/A;   Social History   Occupational History   Not on file  Tobacco Use   Smoking status: Never   Smokeless tobacco: Never  Substance and Sexual Activity   Alcohol use: Yes    Comment: special occassion- very rarely   Drug use: No   Sexual activity: Yes    Partners: Male    Birth control/protection: None

## 2022-08-12 NOTE — Progress Notes (Signed)
Functional Pain Scale - descriptive words and definitions  Distracting (5)    Aware of pain/able to complete some ADL's but limited by pain/sleep is affected and active distractions are only slightly useful. Moderate range order  Average Pain 6  Neck pain on right side that radiates into right arm

## 2022-08-16 ENCOUNTER — Encounter: Payer: Self-pay | Admitting: Gastroenterology

## 2022-08-18 ENCOUNTER — Encounter: Payer: Self-pay | Admitting: Physical Medicine and Rehabilitation

## 2022-08-20 ENCOUNTER — Encounter: Payer: Self-pay | Admitting: Physical Medicine and Rehabilitation

## 2022-08-20 ENCOUNTER — Other Ambulatory Visit: Payer: Self-pay | Admitting: Family Medicine

## 2022-08-22 ENCOUNTER — Other Ambulatory Visit: Payer: 59

## 2022-08-24 DIAGNOSIS — F332 Major depressive disorder, recurrent severe without psychotic features: Secondary | ICD-10-CM | POA: Diagnosis not present

## 2022-08-26 ENCOUNTER — Telehealth: Payer: Self-pay | Admitting: *Deleted

## 2022-08-26 NOTE — Telephone Encounter (Signed)
Although her head imaging from 2010 does not reveal such

## 2022-08-26 NOTE — Telephone Encounter (Signed)
[name]- Patient with Arnold-chiari malformation. Are they automatically deferred to hospital?

## 2022-08-27 ENCOUNTER — Ambulatory Visit (INDEPENDENT_AMBULATORY_CARE_PROVIDER_SITE_OTHER): Payer: 59 | Admitting: Adult Health

## 2022-08-27 ENCOUNTER — Encounter: Payer: Self-pay | Admitting: Adult Health

## 2022-08-27 DIAGNOSIS — F410 Panic disorder [episodic paroxysmal anxiety] without agoraphobia: Secondary | ICD-10-CM | POA: Diagnosis not present

## 2022-08-27 DIAGNOSIS — G4700 Insomnia, unspecified: Secondary | ICD-10-CM | POA: Diagnosis not present

## 2022-08-27 DIAGNOSIS — F411 Generalized anxiety disorder: Secondary | ICD-10-CM

## 2022-08-27 DIAGNOSIS — F331 Major depressive disorder, recurrent, moderate: Secondary | ICD-10-CM

## 2022-08-27 MED ORDER — ALPRAZOLAM 1 MG PO TABS: ORAL_TABLET | ORAL | 2 refills | Status: DC

## 2022-08-27 NOTE — Telephone Encounter (Signed)
Toni/[place] - Please note, will Dr Myrtie Neither want to see her or can you go ahead and and make appt at [facility]?

## 2022-08-27 NOTE — Progress Notes (Signed)
Lauren Hall [id number] 12-Nov-1970 52 y.o.  Subjective:   Patient ID:  Lauren Hall is a 52 y.o. (DOB 13-May-1970) female.  Chief Complaint: No chief complaint on file.   HPI [name] presents to the office today for follow-up of MDD, GAD and insomnia and panic attacks.  Describes mood today as "ok". Pleasant. Tearful at times. Mood symptoms - reports depression and anxiety. Denies   irritability. Reports decreased panic attacks. Reports worry, rumination, and over thinking. Mood is consistent. Stating  "'m doing better than I was, but not where I want to be". Feels like medications are helpful. Reports ongoing situational stressors. Working with a therapist - [name]. Improved interest and motivation. Taking medications as prescribed.  Energy levels lower. Active, does not have a regular exercise routine with physical disabilities.  Enjoys some usual interests and activities. Married. Lives with husband and daughter. Appetite adequate. Weight gain - 169 pounds. Sleeping better some nights than others. Averages 6 to 7 hours. Focus and concentration difficulties - "fibro fog". Completing tasks. Managing some aspects of household. 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  Denies AH or VH. Denies self harm. Denies substance use.  Previous medication trials: Pristiq, Wellbutrin, Prozac, Cymbalta, Lexapro, Zoloft,    GAD-7    Flowsheet Row Office Visit from 07/29/2022 in [facility] at [facility] Office Visit from 03/30/2022 in [facility] at [facility] Office Visit from 07/14/2021 in [facility] Care at [facility]  Total GAD-7 Score 0 0 15      PHQ2-9    Flowsheet Row Office Visit from 07/29/2022 in [facility] at [facility] Office Visit from 03/30/2022 in [facility] Primary Care at [facility] Office Visit from 07/14/2021 in [facility] at [facility]  PHQ-2 Total Score 0 0 6  PHQ-9 Total Score 0 0 17        Review of Systems:  Review of Systems  Musculoskeletal:  Negative for gait problem.  Neurological:  Negative for tremors.  Psychiatric/Behavioral:         Please refer to HPI    Medications: I have reviewed the patient's current medications.  Current Outpatient Medications  Medication Sig Dispense Refill   ALPRAZolam (XANAX) 1 MG tablet Take one tablet twice daily. 60 tablet 2   atorvastatin (LIPITOR) 10 MG tablet Take 0.5 tablets (5 mg total) by mouth daily. 15 tablet 5   busPIRone (BUSPAR) 5 MG tablet TAKE 1 TABLET(5 MG) BY MOUTH THREE TIMES DAILY 270 tablet 3   cyclobenzaprine (FLEXERIL) 10 MG tablet Take 0.5-1 tablets (5-10 mg total) by mouth 3 (three) times daily as needed for muscle spasms. 90 tablet 0   gabapentin (NEURONTIN) 300 MG capsule TAKE 1 CAPSULE BY MOUTH TWICE A DAY AND 2 CAPSULES AT BEDTIME 360 capsule 1   levothyroxine (SYNTHROID) 25 MCG tablet TAKE 1 TABLET BY MOUTH EVERY DAY BEFORE BREAKFAST 30 tablet 2   lisinopril (ZESTRIL) 10 MG tablet Take 1 tablet (10 mg total) by mouth in the morning and at bedtime. 180 tablet 0   LOW-DOSE ASPIRIN PO      omeprazole (PRILOSEC) 40 MG capsule Take 1 capsule (40 mg total) by mouth daily. 90 capsule 0   ondansetron (ZOFRAN) 4 MG tablet Take 1 tablet (4 mg total) by mouth every 8 (eight) hours as needed for nausea or vomiting.  30 tablet 1   venlafaxine XR (EFFEXOR-XR) 150 MG 24 hr capsule Take one capsule every morning. 90 capsule 3   No current facility-administered medications for this visit.    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   Complication of  anesthesia    patient woke up while they were sewing up her incision with breast lumpectomy   Depression    Dysrhythmia    Tachycardia   Enlarged thyroid    Fatigue 10/08/2010   Fibromyalgia 2009   GERD (gastroesophageal reflux disease) 09/26/2013   Headache(784.0) 10/08/2010   Migraines   Hip pain, bilateral 11/06/2010   Hyperglycemia 08/11/2015   normal Hgb A 1C per patient   Hypertension    Hypothyroidism 10/08/2010   Low back pain radiating to right leg 03/11/2015   Other and unspecified hyperlipidemia 07/29/2012   Peripheral neuropathy 11/11/2012   burning sensation mainly right leg   Poor concentration 01/11/2011   Preventative health care 07/29/2012   S/P laparoscopic hysterectomy 10/07/2015   Sinusitis, acute 07/27/2011   Tachycardia    Thyroid disease 10/08/2010   Tick bite of flank 11/09/2011   Tinea corporis 11/11/2012   Uterine leiomyoma     Past Medical History, Surgical history, Social history, and Family history were reviewed and updated as appropriate.   Please see review of systems for further details on the patient's review from today.   Objective:   Physical Exam:  LMP 09/02/2015 (Approximate) Comment: continous bleeding  Physical Exam Constitutional:      General: She is not in acute distress. Musculoskeletal:        General: No deformity.  Neurological:     Mental Status: She is alert and oriented to person, place, and time.     Coordination: Coordination normal.  Psychiatric:        Attention and Perception: Attention and perception normal. She does not perceive auditory or visual hallucinations.        Mood and Affect: Affect is not labile, blunt, angry or inappropriate.        Speech: Speech normal.        Behavior: Behavior normal.        Thought Content: Thought content normal. Thought content is not paranoid or delusional. Thought content does not include homicidal or suicidal ideation. Thought content does not include homicidal or suicidal plan.         Cognition and Memory: Cognition and memory normal.        Judgment: Judgment normal.     Comments: Insight intact     Lab Review:     Component Value Date/Time   NA 140 07/27/2022 1558   K 3.6 07/27/2022 1558   CL 102 07/27/2022 1558   CO2 30 07/27/2022 1558   GLUCOSE 105 (H) 07/27/2022 1558   BUN 7 07/27/2022 1558   CREATININE 0.76 07/27/2022 1558   CREATININE 0.79 09/24/2013 1544   CALCIUM 9.4 07/27/2022 1558   PROT 7.6 07/27/2022 1558   ALBUMIN 4.5 07/27/2022 1558   AST 27 07/27/2022 1558   ALT 36 (H) 07/27/2022 1558   ALKPHOS 153 (H) 07/27/2022 1558   BILITOT 0.4 07/27/2022 1558   GFRNONAA >60 01/17/2018 0128   GFRAA >60  01/17/2018 0128       Component Value Date/Time   WBC 7.6 07/27/2022 1558   RBC 4.56 07/27/2022 1558   HGB 13.7 07/27/2022 1558   HCT 41.6 07/27/2022 1558   PLT 330.0 07/27/2022 1558   MCV 91.2 07/27/2022 1558   MCH 30.4 01/17/2018 0128   MCHC 32.8 07/27/2022 1558   RDW 14.4 07/27/2022 1558   LYMPHSABS 2.8 07/27/2022 1558   MONOABS 0.5 07/27/2022 1558   EOSABS 0.2 07/27/2022 1558   BASOSABS 0.0 07/27/2022 1558    No results found for: "POCLITH", "LITHIUM"   No results found for: "PHENYTOIN", "PHENOBARB", "VALPROATE", "CBMZ"   .res Assessment: Plan:    Plan:  PDMP reviewed  1. Buspar 5mg  TID 2. Effexor XR 150mg  daily   3. Xanax 1mg  BID  Therapist - [name]  RTC 3 months  Patient advised to contact office with any questions, adverse effects, or acute worsening in signs and symptoms.  Discussed potential benefits, risk, and side effects of benzodiazepines to include potential risk of tolerance and dependence, as well as possible drowsiness.  Advised patient not to drive if experiencing drowsiness and to take lowest possible effective dose to minimize risk of dependence and tolerance.  Discussed potential metabolic side effects associated with atypical antipsychotics, as well as potential risk for movement side effects. Advised  pt to contact office if movement side effects occur.   There are no diagnoses linked to this encounter.   Please see After Visit Summary for patient specific instructions.  Future Appointments  Date Time Provider Department Center  08/27/2022  2:00 PM Giorgio Chabot, Thereasa Solo, NP CP-CP None  09/08/2022  2:30 PM [facility] PREVISIT RM 52 [facility]  09/30/2022  1:30 PM Danis, Andreas Blower, MD [facility]  10/18/2022  3:00 PM Little Ishikawa, MD [facility] None  11/04/2022  3:00 PM Bradd Canary, MD [facility]    No orders of the defined types were placed in this encounter.   -------------------------------

## 2022-09-01 ENCOUNTER — Other Ambulatory Visit: Payer: Self-pay | Admitting: Family Medicine

## 2022-09-01 NOTE — Telephone Encounter (Signed)
Thank you :)

## 2022-09-02 ENCOUNTER — Other Ambulatory Visit: Payer: Self-pay | Admitting: Family Medicine

## 2022-09-02 ENCOUNTER — Encounter: Payer: Self-pay | Admitting: Family Medicine

## 2022-09-07 ENCOUNTER — Encounter: Payer: Self-pay | Admitting: Physical Medicine and Rehabilitation

## 2022-09-07 NOTE — Telephone Encounter (Signed)
Lauren Hall,  Please review the message stream below from our endoscopy and anesthesia staff.  Are you able to provide any further clarification on this patient's reported diagnosis of "Chiari malformation", which seems to been added to her chart in May 2017?  I see no CT scan or MRI of the head around that time in this EMR.  Anesthesia feels that if she has a Chiari malformation her procedure would need to be done in the hospital endoscopy department rather than our office based endoscopy lab due to some increased risk of difficult intubation (should an emergency arise).  While we could do her procedure in the hospital-based endoscopy lab if necessary, we are booked well into next year for those procedures with the current waiting list and limited availability of those slots.  (And the patient also has a history of procedural no-show with [place] in 2018) .So if any clarification is available that might change anesthesia's mind about this, it would be enormously helpful.  Thank you  [name], [facility]

## 2022-09-08 ENCOUNTER — Telehealth: Payer: Self-pay | Admitting: Physical Medicine and Rehabilitation

## 2022-09-08 DIAGNOSIS — F332 Major depressive disorder, recurrent severe without psychotic features: Secondary | ICD-10-CM | POA: Diagnosis not present

## 2022-09-08 NOTE — Telephone Encounter (Signed)
Thank you for the work on that.  If that MRI report ever comes to you, please forward it to me.  - H. Danis

## 2022-09-08 NOTE — Telephone Encounter (Signed)
Pt called in stating her insurance is requesting additional information for approval of MRI please advise

## 2022-09-14 NOTE — Telephone Encounter (Signed)
Uploaded new clinicals to [employer]icore

## 2022-09-24 NOTE — Telephone Encounter (Signed)
Authorization # [id number]- valdi 08/17/22-03/18/23

## 2022-09-30 ENCOUNTER — Encounter: Payer: 59 | Admitting: Gastroenterology

## 2022-10-04 ENCOUNTER — Other Ambulatory Visit: Payer: Self-pay | Admitting: Family Medicine

## 2022-10-05 ENCOUNTER — Other Ambulatory Visit: Payer: Self-pay | Admitting: *Deleted

## 2022-10-05 MED ORDER — CYCLOBENZAPRINE HCL 10 MG PO TABS: 5.0000 mg | ORAL_TABLET | Freq: Three times a day (TID) | ORAL | 0 refills | Status: DC | PRN

## 2022-10-07 ENCOUNTER — Encounter: Payer: Self-pay | Admitting: Physical Medicine and Rehabilitation

## 2022-10-07 DIAGNOSIS — F332 Major depressive disorder, recurrent severe without psychotic features: Secondary | ICD-10-CM | POA: Diagnosis not present

## 2022-10-09 DIAGNOSIS — F332 Major depressive disorder, recurrent severe without psychotic features: Secondary | ICD-10-CM | POA: Diagnosis not present

## 2022-10-16 ENCOUNTER — Ambulatory Visit
Admission: RE | Admit: 2022-10-16 | Discharge: 2022-10-16 | Disposition: A | Discharge status: Home / Self Care | Payer: 59 | Source: Ambulatory Visit | Attending: Physical Medicine and Rehabilitation | Admitting: Physical Medicine and Rehabilitation

## 2022-10-16 DIAGNOSIS — M542 Cervicalgia: Secondary | ICD-10-CM | POA: Diagnosis not present

## 2022-10-16 DIAGNOSIS — G8929 Other chronic pain: Secondary | ICD-10-CM | POA: Diagnosis not present

## 2022-10-17 NOTE — Progress Notes (Unsigned)
Cardiology Office Note:    Date:  10/18/2022   ID:  Lauren Hall, DOB 07/13/1970, MRN [id number]  PCP:  Bradd Canary, MD  Cardiologist:  Thomasene Ripple, DO  Electrophysiologist:  None   Referring MD: Bradd Canary, MD   Chief Complaint  Patient presents with   Chest Pain    History of Present Illness:    Lauren Hall is a 52 y.o. female with a hx of Arnold-Chiari syndrome, fibromyalgia, GERD, hypertension, hypothyroidism who was referred by Dr. Abner Greenspan for evaluation of chest pain and palpitations.  She previously followed with Dr. Servando Salina, last seen 06/2019.  Echocardiogram 05/09/2019 showed normal biventricular function, grade 1 diastolic dysfunction, no significant valvular disease.  Lexiscan Myoview 06/04/2019 showed normal perfusion, EF 70%.  Zio patch x 7 days 06/2019 showed no significant arrhythmias.  She reports he has been having chest pain about once per month.  Describes sharp pain on left side, can last for few minutes.  She does not exercise due to back and hip pain.  Denies any shortness of breath.  Denies any lower extremity edema.  Does report some lightheadedness but denies any syncope.  She reports having palpitations up to once per week, feels like heart is racing during episodes.  No smoking history.  Family history includes mother died of CABG at age 55.    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   Complication of anesthesia    patient woke up while they were sewing up her incision with breast lumpectomy   Depression    Dysrhythmia    Tachycardia   Enlarged thyroid    Fatigue 10/08/2010   Fibromyalgia 2009   GERD (gastroesophageal reflux disease) 09/26/2013   Headache(784.0) 10/08/2010   Migraines   Hip pain, bilateral  11/06/2010   Hyperglycemia 08/11/2015   normal Hgb A 1C per patient   Hypertension    Hypothyroidism 10/08/2010   Low back pain radiating to right leg 03/11/2015   Other and unspecified hyperlipidemia 07/29/2012   Peripheral neuropathy 11/11/2012   burning sensation mainly right leg   Poor concentration 01/11/2011   Preventative health care 07/29/2012   S/P laparoscopic hysterectomy 10/07/2015   Sinusitis, acute 07/27/2011   Tachycardia    Thyroid disease 10/08/2010   Tick bite of flank 11/09/2011   Tinea corporis 11/11/2012   Uterine leiomyoma     Past Surgical History:  Procedure Laterality Date   BREAST LUMPECTOMY Right 1999   benign    CYSTOSCOPY N/A 10/07/2015   Procedure: CYSTOSCOPY;  Surgeon: Essie Hart, MD;  Location: [facility];  Service: Gynecology;  Laterality: N/A;    Current Medications: Current Meds  Medication Sig   ALPRAZolam (XANAX) 1 MG tablet Take one tablet twice daily.   amLODipine (NORVASC) 5 MG tablet Take 1 tablet (5 mg total) by mouth daily.   atorvastatin (LIPITOR) 10 MG tablet Take 0.5 tablets (5 mg total) by mouth daily.   busPIRone (BUSPAR) 5 MG tablet TAKE 1 TABLET(5 MG) BY MOUTH THREE TIMES DAILY   cyclobenzaprine (FLEXERIL) 10 MG tablet Take 0.5-1 tablets (5-10 mg total) by mouth 3 (three) times daily as needed for muscle spasms.   gabapentin (NEURONTIN) 300 MG capsule TAKE 1  CAPSULE BY MOUTH TWICE A DAY AND TAKE 2 CAPSULES BY MOUTH AT BEDTIME   levothyroxine (SYNTHROID) 25 MCG tablet TAKE 1 TABLET BY MOUTH EVERY DAY BEFORE BREAKFAST   lisinopril (ZESTRIL) 10 MG tablet Take 1 tablet (10 mg total) by mouth in the morning and at bedtime.   LOW-DOSE ASPIRIN PO    metoprolol tartrate (LOPRESSOR) 100 MG tablet Take 100 mg 2 hours before Coronary CT   omeprazole (PRILOSEC) 40 MG capsule Take 1 capsule (40 mg total) by mouth daily.   ondansetron (ZOFRAN) 4 MG tablet Take 1 tablet (4 mg total) by mouth every 8 (eight) hours as needed for nausea or vomiting.    venlafaxine XR (EFFEXOR-XR) 150 MG 24 hr capsule Take one capsule every morning.     Allergies:   Promethazine and Promethazine hcl   Social History   Socioeconomic History   Marital status: Married    Spouse name: Derrick   Number of children: Not on file   Years of education: Not on file   Highest education level: Not on file  Occupational History   Not on file  Tobacco Use   Smoking status: Never   Smokeless tobacco: Never  Substance and Sexual Activity   Alcohol use: Yes    Comment: special occassion- very rarely   Drug use: No   Sexual activity: Yes    Partners: Male    Birth control/protection: None  Other Topics Concern   Not on file  Social History Narrative   Not on file   Social Determinants of Health   Financial Resource Strain: Not on file  Food Insecurity: Not on file  Transportation Needs: Not on file  Physical Activity: Not on file  Stress: Not on file  Social Connections: Not on file     Family History: The patient's family history includes Alcohol abuse in her brother and father; Alzheimer's disease in her paternal grandfather; Arthritis in her mother; Colon cancer in her father and maternal grandmother; Diabetes in her maternal grandfather, maternal grandmother, and mother; Fibromyalgia in her mother; Heart disease in her maternal grandmother; Hyperlipidemia in her maternal grandmother and mother; Hypertension in her brother and mother; Ovarian cancer in her paternal grandmother; Stroke in her maternal grandfather.  ROS:   Please see the history of present illness.     All other systems reviewed and are negative.  EKGs/Labs/Other Studies Reviewed:    The following studies were reviewed today:   EKG:   10/18/2022: Normal sinus rhythm, rate 96, LVH, T wave inversions in leads I/aVL  Recent Labs: 07/27/2022: ALT 36; BUN 7; Creatinine, Ser 0.76; Hemoglobin 13.7; Platelets 330.0; Potassium 3.6; Sodium 140; TSH 0.39  Recent Lipid Panel    Component  Value Date/Time   CHOL 170 07/27/2022 1558   TRIG 118.0 07/27/2022 1558   HDL 46.90 07/27/2022 1558   CHOLHDL 4 07/27/2022 1558   VLDL 23.6 07/27/2022 1558   LDLCALC 99 07/27/2022 1558   LDLDIRECT 146.0 09/19/2020 1632    Physical Exam:    VS:  BP (!) 170/108 (BP Location: Right Arm, Patient Position: Sitting, Cuff Size: Normal)   Pulse 96   Ht 4\' 11"  (1.499 m)   Wt 172 lb (78 kg)   LMP 09/02/2015 (Approximate) Comment: continous bleeding  SpO2 94%   BMI 34.74 kg/m     Wt Readings from Last 3 Encounters:  10/18/22 172 lb (78 kg)  07/29/22 169 lb 12.8 oz (77 kg)  03/30/22 168 lb 3.2 oz (76.3 kg)  GEN:  Well nourished, well developed in no acute distress HEENT: Normal NECK: No JVD; No carotid bruits LYMPHATICS: No lymphadenopathy CARDIAC: RRR, no murmurs, rubs, gallops RESPIRATORY:  Clear to auscultation without rales, wheezing or rhonchi  ABDOMEN: Soft, non-tender, non-distended MUSCULOSKELETAL:  No edema; No deformity  SKIN: Warm and dry NEUROLOGIC:  Alert and oriented x 3 PSYCHIATRIC:  Normal affect   ASSESSMENT:    1. Precordial pain   2. Palpitations   3. Snores   4. Primary hypertension    PLAN:    Chest pain: Atypical description but does have CAD risk factors (hypertension, hyperlipidemia, family history).  Recommend coronary CTA to evaluate for obstructive CAD.  Will give Lopressor 100 mg prior to study  Palpitations: Description concerning for arrhythmia, evaluate with Zio patch x 2 weeks  Hypertension: On lisinopril 20 mg daily.  BP elevated.  Recommend adding amlodipine 5 mg daily.  Asked her to check home BP daily for next 2 weeks and bring log and BP monitor to calibrate to clinic with pharmacy hypertension clinic in 2 weeks.  Discussed limiting dietary salt intake.  Hyperlipidemia: On atorvastatin 10 mg daily.  LDL 99 on 07/27/2022.  Will follow-up results of coronary CTA to guide how aggressive to be in lowering cholesterol  Snoring/daytime  somnolence/observed apnea: Concern for OSA, will check Itamar sleep study.  STOP-BANG 5  RTC in 4 months  Medication Adjustments/Labs and Tests Ordered: Current medicines are reviewed at length with the patient today.  Concerns regarding medicines are outlined above.  Orders Placed This Encounter  Procedures   CT CORONARY MORPH W/CTA COR W/SCORE W/CA W/CM &/OR WO/CM   Basic metabolic panel   AMB Referral to Heartcare Pharm-D   LONG TERM MONITOR (3-14 DAYS)   EKG 12-Lead   Itamar Sleep Study   Meds ordered this encounter  Medications   amLODipine (NORVASC) 5 MG tablet    Sig: Take 1 tablet (5 mg total) by mouth daily.    Dispense:  90 tablet    Refill:  3   metoprolol tartrate (LOPRESSOR) 100 MG tablet    Sig: Take 100 mg 2 hours before Coronary CT    Dispense:  1 tablet    Refill:  0    Patient Instructions  Medication Instructions:  Start Amlodipine 5 mg daily Continue all other medications *If you need a refill on your cardiac medications before your next appointment, please call your pharmacy*   Lab Work: Bmet today   Testing/Procedures: Itamar sleep study  14 day Zio heart monitor   Coronary CT   will be scheduled after approved by insurance    Follow instructions below   Follow-Up: At [facility], you and your health needs are our priority.  As part of our continuing mission to provide you with exceptional heart care, we have created designated Provider Care Teams.  These Care Teams include your primary Cardiologist (physician) and Advanced Practice Providers (APPs -  Physician Assistants and Nurse Practitioners) who all work together to provide you with the care you need, when you need it.  We recommend signing up for the patient portal called "MyChart".  Sign up information is provided on this After Visit Summary.  MyChart is used to connect with patients for Virtual Visits (Telemedicine).  Patients are able to view lab/test results, encounter notes,  upcoming appointments, etc.  Non-urgent messages can be sent to your provider as well.   To learn more about what you can do with MyChart, go  to [email or web address].    Your next appointment:  4 months    Provider:  Dr.Daleigh Pollinger      Schedule appointment with Pharmacist in Hypertension Clinic in 2 weeks    Follow [place] guidelines   Omron blood pressure monitor   Check blood pressure daily for 2 weeks and bring readings along with monitor to appointment        Your cardiac CT will be scheduled at one of the below locations:   [facility] [address] [place], [place] [address] [phone]  OR  [facility] [address] [place], [place] [address] [phone]  OR   [facility] [address] [place], [place] [address] [phone]  If scheduled at [facility], please arrive at the [facility] Entrance (Entrance C2) of [facility] 30 minutes prior to test start time. You can use the FREE valet parking offered at entrance C (encouraged to control the heart rate for the test)  Proceed to the [facility] Radiology Department (first floor) to check-in and test prep.  All radiology patients and guests should use entrance C2 at [facility], accessed from [facility], even though the hospital's physical address listed is [address].    If scheduled at [facility] or [facility], please arrive 15 mins early for check-in and test prep.  There is spacious parking and easy access to the radiology department from the [facility] Heart and Vascular entrance. Please enter here and check-in with the desk attendant.   Please follow these instructions carefully (unless otherwise directed):  An IV will be required for this test and Nitroglycerin will be given.  Hold all erectile dysfunction  medications at least 3 days (72 hrs) prior to test. (Ie viagra, cialis, sildenafil, tadalafil, etc)   On the Night Before the Test: Be sure to Drink plenty of water. Do not consume any caffeinated/decaffeinated beverages or chocolate 12 hours prior to your test. Do not take any antihistamines 12 hours prior to your test.   On the Day of the Test: Drink plenty of water until 1 hour prior to the test. Do not eat any food 1 hour prior to test. You may take your regular medications prior to the test.  Take metoprolol 100 mg two hours prior to test. If you take Furosemide/Hydrochlorothiazide/Spironolactone, please HOLD on the morning of the test. FEMALES- please wear underwire-free bra if available, avoid dresses & tight clothing        After the Test: Drink plenty of water. After receiving IV contrast, you may experience a mild flushed feeling. This is normal. On occasion, you may experience a mild rash up to 24 hours after the test. This is not dangerous. If this occurs, you can take Benadryl 25 mg and increase your fluid intake. If you experience trouble breathing, this can be serious. If it is severe call 911 IMMEDIATELY. If it is mild, please call our office. If you take any of these medications: Glipizide/Metformin, Avandament, Glucavance, please do not take 48 hours after completing test unless otherwise instructed.  We will call to schedule your test 2-4 weeks out understanding that some insurance companies will need an authorization prior to the service being performed.   For more information and frequently asked questions, please visit our website : [email or web address]  For non-scheduling related questions, please contact the cardiac imaging nurse navigator should you have any  questions/concerns: Cardiac Imaging Nurse Navigators Direct Office Dial: [phone]   For scheduling needs, including cancellations and rescheduling, please call [place], [phone].      Signed, Little Ishikawa, MD  10/18/2022 5:30 PM    [facility]

## 2022-10-18 ENCOUNTER — Encounter: Payer: Self-pay | Admitting: Cardiology

## 2022-10-18 ENCOUNTER — Ambulatory Visit (INDEPENDENT_AMBULATORY_CARE_PROVIDER_SITE_OTHER): Payer: 59

## 2022-10-18 ENCOUNTER — Ambulatory Visit: Payer: 59 | Attending: Cardiology | Admitting: Cardiology

## 2022-10-18 VITALS — BP 170/108 | HR 96 | Ht 59.0 in | Wt 172.0 lb

## 2022-10-18 DIAGNOSIS — R000 Tachycardia, unspecified: Secondary | ICD-10-CM

## 2022-10-18 DIAGNOSIS — R0683 Snoring: Secondary | ICD-10-CM | POA: Diagnosis not present

## 2022-10-18 DIAGNOSIS — R002 Palpitations: Secondary | ICD-10-CM

## 2022-10-18 DIAGNOSIS — I10 Essential (primary) hypertension: Secondary | ICD-10-CM | POA: Diagnosis not present

## 2022-10-18 DIAGNOSIS — R072 Precordial pain: Secondary | ICD-10-CM | POA: Diagnosis not present

## 2022-10-18 MED ORDER — METOPROLOL TARTRATE 100 MG PO TABS: ORAL_TABLET | ORAL | 0 refills | Status: DC

## 2022-10-18 MED ORDER — AMLODIPINE BESYLATE 5 MG PO TABS: 5.0000 mg | ORAL_TABLET | Freq: Every day | ORAL | 3 refills | Status: DC

## 2022-10-18 NOTE — Patient Instructions (Addendum)
Medication Instructions:  Start Amlodipine 5 mg daily Continue all other medications *If you need a refill on your cardiac medications before your next appointment, please call your pharmacy*   Lab Work: Bmet today   Testing/Procedures: Itamar sleep study  14 day Zio heart monitor   Coronary CT   will be scheduled after approved by insurance    Follow instructions below   Follow-Up: At [facility], you and your health needs are our priority.  As part of our continuing mission to provide you with exceptional heart care, we have created designated Provider Care Teams.  These Care Teams include your primary Cardiologist (physician) and Advanced Practice Providers (APPs -  Physician Assistants and Nurse Practitioners) who all work together to provide you with the care you need, when you need it.  We recommend signing up for the patient portal called "MyChart".  Sign up information is provided on this After Visit Summary.  MyChart is used to connect with patients for Virtual Visits (Telemedicine).  Patients are able to view lab/test results, encounter notes, upcoming appointments, etc.  Non-urgent messages can be sent to your provider as well.   To learn more about what you can do with MyChart, go to [email or web address].    Your next appointment:  4 months    Provider:  Dr.Schumann      Schedule appointment with Pharmacist in Hypertension Clinic in 2 weeks    Follow [place] guidelines   Omron blood pressure monitor   Check blood pressure daily for 2 weeks and bring readings along with monitor to appointment        Your cardiac CT will be scheduled at one of the below locations:   [facility] [address] [place], [place] [address] [phone]  OR  [facility] [address] [place], [place] [address] [phone]  OR   [facility] [address] [place], [place] [address] [phone]  If scheduled at [facility], please arrive at the [facility] and Children's Entrance (Entrance C2) of [facility] 30 minutes prior to test start time. You can use the FREE valet parking offered at entrance C (encouraged to control the heart rate for the test)  Proceed to the [facility] Radiology Department (first floor) to check-in and test prep.  All radiology patients and guests should use entrance C2 at [facility], accessed from [facility], even though the hospital's physical address listed is [address].    If scheduled at [facility] or [facility], please arrive 15 mins early for check-in and test prep.  There is spacious parking and easy access to the radiology department from the [facility] Heart and Vascular entrance. Please enter here and check-in with the desk attendant.   Please follow these instructions carefully (unless otherwise directed):  An IV will be required for this test and Nitroglycerin will be given.  Hold all erectile dysfunction medications at least 3 days (72 hrs) prior to test. (Ie viagra, cialis, sildenafil, tadalafil, etc)   On the Night Before the Test: Be sure to Drink plenty of water. Do not consume any caffeinated/decaffeinated beverages or chocolate 12 hours prior to your test. Do not take any antihistamines 12 hours prior to your test.   On the Day of the Test: Drink plenty of water until 1 hour prior to the test. Do not eat any food 1 hour prior  to test. You may take your regular medications prior to the test.  Take metoprolol 100 mg two hours prior to test. If you take Furosemide/Hydrochlorothiazide/Spironolactone, please HOLD on the morning of the test. FEMALES- please wear underwire-free bra if available, avoid dresses & tight clothing        After the Test: Drink plenty of water. After receiving IV contrast,  you may experience a mild flushed feeling. This is normal. On occasion, you may experience a mild rash up to 24 hours after the test. This is not dangerous. If this occurs, you can take Benadryl 25 mg and increase your fluid intake. If you experience trouble breathing, this can be serious. If it is severe call 911 IMMEDIATELY. If it is mild, please call our office. If you take any of these medications: Glipizide/Metformin, Avandament, Glucavance, please do not take 48 hours after completing test unless otherwise instructed.  We will call to schedule your test 2-4 weeks out understanding that some insurance companies will need an authorization prior to the service being performed.   For more information and frequently asked questions, please visit our website : [email or web address]  For non-scheduling related questions, please contact the cardiac imaging nurse navigator should you have any questions/concerns: Cardiac Imaging Nurse Navigators Direct Office Dial: [phone]   For scheduling needs, including cancellations and rescheduling, please call [place], [phone].

## 2022-10-18 NOTE — Progress Notes (Unsigned)
Enrolled for [employer] to mail a ZIO XT long term holter monitor to the patients address on file.  

## 2022-10-19 ENCOUNTER — Other Ambulatory Visit: Payer: Self-pay | Admitting: Family Medicine

## 2022-10-19 LAB — BASIC METABOLIC PANEL
BUN/Creatinine Ratio: 6 — ABNORMAL LOW (ref 9–23)
BUN: 5 mg/dL — ABNORMAL LOW (ref 6–24)
CO2: 28 mmol/L (ref 20–29)
Calcium: 9.6 mg/dL (ref 8.7–10.2)
Chloride: 100 mmol/L (ref 96–106)
Creatinine, Ser: 0.79 mg/dL (ref 0.57–1.00)
Glucose: 101 mg/dL — ABNORMAL HIGH (ref 70–99)
Potassium: 4.2 mmol/L (ref 3.5–5.2)
Sodium: 142 mmol/L (ref 134–144)
eGFR: 89 mL/min/{1.73_m2} (ref >59)

## 2022-10-22 DIAGNOSIS — R072 Precordial pain: Secondary | ICD-10-CM

## 2022-10-22 DIAGNOSIS — R000 Tachycardia, unspecified: Secondary | ICD-10-CM | POA: Diagnosis not present

## 2022-10-28 ENCOUNTER — Encounter: Payer: Self-pay | Admitting: Cardiology

## 2022-10-29 ENCOUNTER — Encounter (HOSPITAL_COMMUNITY): Payer: Self-pay

## 2022-11-01 ENCOUNTER — Telehealth (HOSPITAL_COMMUNITY): Payer: Self-pay | Admitting: Emergency Medicine

## 2022-11-01 NOTE — Telephone Encounter (Signed)
Patient states zio patch in place, wishing to r/s appt for next week.  New appt made for 10/8  Rockwell Alexandria RN Navigator Cardiac Imaging [facility] Services [phone] Office  [phone] Cell

## 2022-11-02 ENCOUNTER — Other Ambulatory Visit (INDEPENDENT_AMBULATORY_CARE_PROVIDER_SITE_OTHER): Payer: 59

## 2022-11-02 ENCOUNTER — Ambulatory Visit (HOSPITAL_COMMUNITY): Payer: 59 | Attending: Cardiology | Admitting: Student

## 2022-11-02 ENCOUNTER — Ambulatory Visit (HOSPITAL_COMMUNITY): Admission: RE | Admit: 2022-11-02 | Payer: 59 | Source: Ambulatory Visit

## 2022-11-02 ENCOUNTER — Encounter: Payer: Self-pay | Admitting: Student

## 2022-11-02 VITALS — BP 123/79 | HR 95

## 2022-11-02 DIAGNOSIS — I10 Essential (primary) hypertension: Secondary | ICD-10-CM

## 2022-11-02 DIAGNOSIS — E039 Hypothyroidism, unspecified: Secondary | ICD-10-CM | POA: Diagnosis not present

## 2022-11-02 DIAGNOSIS — R739 Hyperglycemia, unspecified: Secondary | ICD-10-CM

## 2022-11-02 LAB — CBC WITH DIFFERENTIAL/PLATELET
Basophils Absolute: 0.1 10*3/uL (ref 0.0–0.1)
Basophils Relative: 0.9 % (ref 0.0–3.0)
Eosinophils Absolute: 0.2 10*3/uL (ref 0.0–0.7)
Eosinophils Relative: 2.6 % (ref 0.0–5.0)
HCT: 39.5 % (ref 36.0–46.0)
Hemoglobin: 13.1 g/dL (ref 12.0–15.0)
Lymphocytes Relative: 40.3 % (ref 12.0–46.0)
Lymphs Abs: 3.3 10*3/uL (ref 0.7–4.0)
MCHC: 33.3 g/dL (ref 30.0–36.0)
MCV: 90.1 fL (ref 78.0–100.0)
Monocytes Absolute: 0.6 10*3/uL (ref 0.1–1.0)
Monocytes Relative: 7.2 % (ref 3.0–12.0)
Neutro Abs: 4 10*3/uL (ref 1.4–7.7)
Neutrophils Relative %: 49 % (ref 43.0–77.0)
Platelets: 360 10*3/uL (ref 150.0–400.0)
RBC: 4.39 Mil/uL (ref 3.87–5.11)
RDW: 13.5 % (ref 11.5–15.5)
WBC: 8.2 10*3/uL (ref 4.0–10.5)

## 2022-11-02 LAB — HEMOGLOBIN A1C: Hgb A1c MFr Bld: 6.6 % — ABNORMAL HIGH (ref 4.6–6.5)

## 2022-11-02 LAB — LIPID PANEL
Cholesterol: 151 mg/dL (ref 0–200)
HDL: 50.6 mg/dL (ref >39.00)
LDL Cholesterol: 71 mg/dL (ref 0–99)
NonHDL: 100.37
Total CHOL/HDL Ratio: 3
Triglycerides: 149 mg/dL (ref 0.0–149.0)
VLDL: 29.8 mg/dL (ref 0.0–40.0)

## 2022-11-02 LAB — COMPREHENSIVE METABOLIC PANEL
ALT: 37 U/L — ABNORMAL HIGH (ref 0–35)
AST: 24 U/L (ref 0–37)
Albumin: 4.3 g/dL (ref 3.5–5.2)
Alkaline Phosphatase: 141 U/L — ABNORMAL HIGH (ref 39–117)
BUN: 5 mg/dL — ABNORMAL LOW (ref 6–23)
CO2: 29 meq/L (ref 19–32)
Calcium: 9.3 mg/dL (ref 8.4–10.5)
Chloride: 101 meq/L (ref 96–112)
Creatinine, Ser: 0.71 mg/dL (ref 0.40–1.20)
GFR: 96.72 mL/min (ref >60.00)
Glucose, Bld: 125 mg/dL — ABNORMAL HIGH (ref 70–99)
Potassium: 3.4 meq/L — ABNORMAL LOW (ref 3.5–5.1)
Sodium: 139 meq/L (ref 135–145)
Total Bilirubin: 0.4 mg/dL (ref 0.2–1.2)
Total Protein: 7.6 g/dL (ref 6.0–8.3)

## 2022-11-02 LAB — TSH: TSH: 1.32 u[IU]/mL (ref 0.35–5.50)

## 2022-11-02 NOTE — Progress Notes (Signed)
Patient ID: [name]                 DOB: 07-24-1970                      MRN: [id number]      HPI: Lauren Hall is a 52 y.o. female referred by Dr. Bjorn Hall to HTN clinic. PMH is significant for Arnold-Chiari syndrome, fibromyalgia, GERD, hypertension, hypothyroidism. Patient was referred by PCP to Dr Lauren Hall for precordial pain. At last OV on 10/18/2022 BP was elevated (170/108). Amlodipine was added to lisinopril 10 mg daily   Patient presented today for BP follow up. Reports her home BP coming down as she is recovering from pain from Covid shot. She has been watching her salt intake. Cut down on salt significantly.  This morning her BP was 149/87 , heart rate 101. Since her 1st pregnancy her heart rate remains high. Her pregnancy was complicated due to elevated heart rate. Due to her chronic pain she is unable to perform exercise. We validated home BP monitor; readings was within 10 points. She get SOB, palpitation but that is from her anxiety. Denies swelling, headache, chest pain or dizziness.     Current HTN meds: lisinopril 10 mg twice daily, amlodipine 5 mg daily  Previously tried: none  BP goal: <130/80  Family History:  Relation Problem Comments  Mother - 34 (Deceased) Arthritis   Diabetes type 2  Fibromyalgia   Hyperlipidemia   Hypertension     Father - 3 (Deceased) Alcohol abuse   Colon cancer     Sister - P 1/2 (Alive)   Brother - P 1/2 Metallurgist) Alcohol abuse   Hypertension     Maternal Grandmother (Deceased at age 37) Colon cancer   Diabetes type 2  Heart disease   Hyperlipidemia     Maternal Grandfather (Deceased) Diabetes type 2  Stroke     Paternal Grandmother (Deceased at age younger)       Diet: does not add salt to her food, reads food label for sodium contents.  Does not drink caffeinated beverages   Exercise:  Due to hip pain, back pain and fibromyalgia unable to perform any exercise longer than 10 min   Home BP readings: 148/80  heart rate 101 this morning   Wt Readings from Last 3 Encounters:  10/18/22 172 lb (78 kg)  07/29/22 169 lb 12.8 oz (77 kg)  03/30/22 168 lb 3.2 oz (76.3 kg)   BP Readings from Last 3 Encounters:  11/02/22 123/79  10/18/22 (!) 170/108  07/29/22 136/80   Pulse Readings from Last 3 Encounters:  11/02/22 95  10/18/22 96  08/01/22 86    Renal function: CrCl cannot be calculated (Unknown ideal weight.).  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   Complication of anesthesia    patient woke up while they were sewing up her incision with breast lumpectomy   Depression    Dysrhythmia    Tachycardia   Enlarged thyroid    Fatigue 10/08/2010   Fibromyalgia 2009   GERD (gastroesophageal reflux disease) 09/26/2013   Headache(784.0) 10/08/2010   Migraines   Hip pain, bilateral 11/06/2010   Hyperglycemia 08/11/2015   normal Hgb A 1C per patient   Hypertension    Hypothyroidism 10/08/2010   Low back pain radiating to  right leg 03/11/2015   Other and unspecified hyperlipidemia 07/29/2012   Peripheral neuropathy 11/11/2012   burning sensation mainly right leg   Poor concentration 01/11/2011   Preventative health care 07/29/2012   S/P laparoscopic hysterectomy 10/07/2015   Sinusitis, acute 07/27/2011   Tachycardia    Thyroid disease 10/08/2010   Tick bite of flank 11/09/2011   Tinea corporis 11/11/2012   Uterine leiomyoma     Current Outpatient Medications on File Prior to Visit  Medication Sig Dispense Refill   gabapentin (NEURONTIN) 300 MG capsule TAKE 1 CAPSULE BY MOUTH TWICE A DAY AND TAKE 2 CAPSULES BY MOUTH AT BEDTIME 120 capsule 2   levothyroxine (SYNTHROID) 25 MCG tablet TAKE 1 TABLET BY MOUTH EVERY DAY BEFORE BREAKFAST 30 tablet 2   lisinopril (ZESTRIL) 10 MG tablet TAKE 1 TABLET (10 MG TOTAL) BY MOUTH IN THE MORNING AND AT BEDTIME 60 tablet 2   ALPRAZolam  (XANAX) 1 MG tablet Take one tablet twice daily. 60 tablet 2   amLODipine (NORVASC) 5 MG tablet Take 1 tablet (5 mg total) by mouth daily. 90 tablet 3   atorvastatin (LIPITOR) 10 MG tablet Take 0.5 tablets (5 mg total) by mouth daily. 15 tablet 5   busPIRone (BUSPAR) 5 MG tablet TAKE 1 TABLET(5 MG) BY MOUTH THREE TIMES DAILY 270 tablet 3   cyclobenzaprine (FLEXERIL) 10 MG tablet Take 0.5-1 tablets (5-10 mg total) by mouth 3 (three) times daily as needed for muscle spasms. 90 tablet 0   LOW-DOSE ASPIRIN PO      metoprolol tartrate (LOPRESSOR) 100 MG tablet Take 100 mg 2 hours before Coronary CT 1 tablet 0   omeprazole (PRILOSEC) 40 MG capsule Take 1 capsule (40 mg total) by mouth daily. 90 capsule 0   ondansetron (ZOFRAN) 4 MG tablet Take 1 tablet (4 mg total) by mouth every 8 (eight) hours as needed for nausea or vomiting. 30 tablet 1   venlafaxine XR (EFFEXOR-XR) 150 MG 24 hr capsule Take one capsule every morning. 90 capsule 3   No current facility-administered medications on file prior to visit.    Allergies  Allergen Reactions   Promethazine Anaphylaxis   Promethazine Hcl Anaphylaxis    Blood pressure 123/79, pulse 95, last menstrual period 09/02/2015, SpO2 100%.   Assessment/Plan:  1. Hypertension -  Hypertension Assessment: BP is un/controlled in office BP 123/79 mmHg  heart rate 95 (goal <130/80)  Home BP ~140-150/78-80 range till yesterday she was recovering from Covid booster shot's side effect.  Her home BP is improving as her pain goes down  Home BP monitor reads within 10 points compare to office monitor Takes current BP medication regularly and tolerates them well without any side effects Denies chest pain, headaches, dizziness,or swelling High heart rate and SOB due to anxiety  Reiterated the importance of regular exercise and low salt diet   Plan:  No changes to current BP medication regimen  Continue taking Lisinopril 20 mg daily (10 mg twice daily) and  amlodipine 5 mg daily  Patient to keep record of BP readings with heart rate and report to [place] at the next visit Patient to see PharmD in 4 weeks for follow up  In future can consider titrating lisinopril to max dose 20 mg twice daily or add thiazide diuretics  Follow up lab(s): none    Thank you  Carmela Hurt, Pharm.D [facility] A Division of [facility] [address], [place], [place] [address]  Phone: [phone];  Fax: [phone]

## 2022-11-02 NOTE — Patient Instructions (Signed)
No Changes made by your pharmacist Cammy Copa, PharmD at today's visit:     Bring all of your meds, your BP cuff and your record of home blood pressures to your next appointment.    HOW TO TAKE YOUR BLOOD PRESSURE AT HOME  Rest 5 minutes before taking your blood pressure.  Don't smoke or drink caffeinated beverages for at least 30 minutes before. Take your blood pressure before (not after) you eat. Sit comfortably with your back supported and both feet on the floor (don't cross your legs). Elevate your arm to heart level on a table or a desk. Use the proper sized cuff. It should fit smoothly and snugly around your bare upper arm. There should be enough room to slip a fingertip under the cuff. The bottom edge of the cuff should be 1 inch above the crease of the elbow. Ideally, take 3 measurements at one sitting and record the average.  Important lifestyle changes to control high blood pressure  Intervention  Effect on the BP  Lose extra pounds and watch your waistline Weight loss is one of the most effective lifestyle changes for controlling blood pressure. If you're overweight or obese, losing even a small amount of weight can help reduce blood pressure. Blood pressure might go down by about 1 millimeter of mercury (mm Hg) with each kilogram (about 2.2 pounds) of weight lost.  Exercise regularly As a general goal, aim for at least 30 minutes of moderate physical activity every day. Regular physical activity can lower high blood pressure by about 5 to 8 mm Hg.  Eat a healthy diet Eating a diet rich in whole grains, fruits, vegetables, and low-fat dairy products and low in saturated fat and cholesterol. A healthy diet can lower high blood pressure by up to 11 mm Hg.  Reduce salt (sodium) in your diet Even a small reduction of sodium in the diet can improve heart health and reduce high blood pressure by about 5 to 6 mm Hg.  Limit alcohol One drink equals 12 ounces of beer, 5 ounces of  wine, or 1.5 ounces of 80-proof liquor.  Limiting alcohol to less than one drink a day for women or two drinks a day for men can help lower blood pressure by about 4 mm Hg.   If you have any questions or concerns please use My Chart to send questions or call the office at [phone]

## 2022-11-02 NOTE — Assessment & Plan Note (Addendum)
Assessment: BP is un/controlled in office BP 123/79 mmHg  heart rate 95 (goal <130/80)  Home BP ~140-150/78-80 range till yesterday she was recovering from Covid booster shot's side effect.  Her home BP is improving as her pain goes down  Home BP monitor reads within 10 points of the office monitor Takes current BP medication regularly and tolerates them well without any side effects Denies chest pain, headaches, dizziness,or swelling High heart rate and SOB due to anxiety  Reiterated the importance of regular exercise and low salt diet   Plan:  No changes to current BP medication regimen  Continue taking Lisinopril 20 mg daily (10 mg twice daily) and amlodipine 5 mg daily  Patient to keep record of BP readings with heart rate and report to [place] at the next visit Patient to see PharmD in 4 weeks for follow up  In future can consider titrating lisinopril to max dose 20 mg twice daily or add thiazide diuretics  Follow up lab(s): none

## 2022-11-03 NOTE — Assessment & Plan Note (Signed)
Tolerating current meds 

## 2022-11-03 NOTE — Assessment & Plan Note (Signed)
On Levothyroxine, continue to monitor 

## 2022-11-03 NOTE — Assessment & Plan Note (Signed)
Well controlled, no changes to meds. Encouraged heart healthy diet such as the DASH diet and exercise as tolerated.  °

## 2022-11-03 NOTE — Assessment & Plan Note (Signed)
hgba1c acceptable, minimize simple carbs. Increase exercise as tolerated. Continue current meds 

## 2022-11-04 ENCOUNTER — Ambulatory Visit (INDEPENDENT_AMBULATORY_CARE_PROVIDER_SITE_OTHER): Payer: 59 | Admitting: Family Medicine

## 2022-11-04 VITALS — BP 136/86 | HR 84 | Temp 98.0°F | Resp 16 | Ht 59.0 in | Wt 172.8 lb

## 2022-11-04 DIAGNOSIS — I10 Essential (primary) hypertension: Secondary | ICD-10-CM

## 2022-11-04 DIAGNOSIS — Z7985 Long-term (current) use of injectable non-insulin antidiabetic drugs: Secondary | ICD-10-CM | POA: Diagnosis not present

## 2022-11-04 DIAGNOSIS — F418 Other specified anxiety disorders: Secondary | ICD-10-CM | POA: Diagnosis not present

## 2022-11-04 DIAGNOSIS — E1169 Type 2 diabetes mellitus with other specified complication: Secondary | ICD-10-CM

## 2022-11-04 DIAGNOSIS — E785 Hyperlipidemia, unspecified: Secondary | ICD-10-CM

## 2022-11-04 DIAGNOSIS — E039 Hypothyroidism, unspecified: Secondary | ICD-10-CM

## 2022-11-04 DIAGNOSIS — E782 Mixed hyperlipidemia: Secondary | ICD-10-CM

## 2022-11-04 DIAGNOSIS — R739 Hyperglycemia, unspecified: Secondary | ICD-10-CM

## 2022-11-04 MED ORDER — TIRZEPATIDE 2.5 MG/0.5ML ~~LOC~~ SOAJ: 2.5000 mg | SUBCUTANEOUS | 1 refills | Status: DC

## 2022-11-04 NOTE — Patient Instructions (Signed)
Novarax COVID vaccine

## 2022-11-05 ENCOUNTER — Other Ambulatory Visit: Payer: Self-pay

## 2022-11-05 ENCOUNTER — Telehealth: Payer: Self-pay

## 2022-11-05 MED ORDER — LANCETS MISC. MISC: 1.0000 | Freq: Three times a day (TID) | 0 refills | Status: AC

## 2022-11-05 MED ORDER — LANCET DEVICE MISC: 1.0000 | Freq: Three times a day (TID) | 0 refills | Status: AC

## 2022-11-05 MED ORDER — BLOOD GLUCOSE MONITORING SUPPL DEVI: 1.0000 | Freq: Three times a day (TID) | 0 refills | Status: AC

## 2022-11-05 MED ORDER — BLOOD GLUCOSE TEST VI STRP: 1.0000 | ORAL_STRIP | Freq: Three times a day (TID) | 2 refills | Status: DC

## 2022-11-05 NOTE — Telephone Encounter (Signed)
-----   Message from [place], [place] E sent at 11/05/2022  8:12 AM EDT ----- Can we schedule cervical MRI review? Thanks ----- Message ----- From: Interface, Rad Results In Sent: 11/02/2022   3:39 PM EDT To: Juanda Chance, NP

## 2022-11-05 NOTE — Telephone Encounter (Signed)
LVM to return call to schedule OV 

## 2022-11-06 ENCOUNTER — Encounter: Payer: Self-pay | Admitting: Family Medicine

## 2022-11-07 ENCOUNTER — Encounter: Payer: Self-pay | Admitting: Family Medicine

## 2022-11-07 NOTE — Progress Notes (Signed)
Subjective:    Patient ID: Lauren Hall, female    DOB: 1970/07/13, 52 y.o.   MRN: [id number]  Chief Complaint  Patient presents with   Follow-up    Follow up    HPI Discussed the use of AI scribe software for clinical note transcription with the patient, who gave verbal consent to proceed.  History of Present Illness   The patient, with a history of fibromyalgia and hypertension, presents with chronic lower back pain that radiates down the right leg, more so than the left. The pain is severe enough to limit the patient's ability to stand or walk for extended periods, making daily activities such as grocery shopping challenging. The patient also reports fatigue, stating that they feel tired even after minimal activity. The patient has been managing muscle pain with Flexeril but finds it causes grogginess.  The patient recently received both the COVID-19 and flu vaccines and experienced full-body pain for several days afterward. The patient believes the COVID-19 vaccine, in particular, triggers their fibromyalgia flares. The patient has been trying alternative remedies such as mushroom tea and collagen to manage their symptoms, with some success in reducing brain fog and joint pain.  The patient's recent blood work shows an A1c of 6.6, indicating a new diagnosis of diabetes. The patient has been making dietary changes, such as reducing sugar intake and increasing protein, in an attempt to manage their blood sugar levels.        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   Complication of anesthesia    patient woke up while they were sewing up her incision with breast lumpectomy   Depression    Dysrhythmia    Tachycardia   Enlarged thyroid    Fatigue 10/08/2010   Fibromyalgia 2009   GERD (gastroesophageal reflux disease) 09/26/2013   Headache(784.0)  10/08/2010   Migraines   Hip pain, bilateral 11/06/2010   Hyperglycemia 08/11/2015   normal Hgb A 1C per patient   Hypertension    Hypothyroidism 10/08/2010   Low back pain radiating to right leg 03/11/2015   Other and unspecified hyperlipidemia 07/29/2012   Peripheral neuropathy 11/11/2012   burning sensation mainly right leg   Poor concentration 01/11/2011   Preventative health care 07/29/2012   S/P laparoscopic hysterectomy 10/07/2015   Sinusitis, acute 07/27/2011   Tachycardia    Thyroid disease 10/08/2010   Tick bite of flank 11/09/2011   Tinea corporis 11/11/2012   Uterine leiomyoma     Past Surgical History:  Procedure Laterality Date   BREAST LUMPECTOMY Right 1999   benign    CYSTOSCOPY N/A 10/07/2015   Procedure: CYSTOSCOPY;  Surgeon: Essie Hart, MD;  Location: [facility];  Service: Gynecology;  Laterality: N/A;    Family History  Problem Relation Age of Onset   Fibromyalgia Mother    Diabetes Mother        type 2   Hypertension Mother    Hyperlipidemia Mother    Arthritis Mother    Alcohol abuse Father    Colon cancer Father    Hypertension Brother    Alcohol abuse Brother    Heart disease Maternal Grandmother    Hyperlipidemia Maternal Grandmother    Diabetes Maternal Grandmother        type 2   Colon cancer Maternal Grandmother    Stroke Maternal Grandfather  Diabetes Maternal Grandfather        type 2   Ovarian cancer Paternal Grandmother    Alzheimer's disease Paternal Grandfather     Social History   Socioeconomic History   Marital status: Married    Spouse name: Derrick   Number of children: Not on file   Years of education: Not on file   Highest education level: Not on file  Occupational History   Not on file  Tobacco Use   Smoking status: Never   Smokeless tobacco: Never  Substance and Sexual Activity   Alcohol use: Yes    Comment: special occassion- very rarely   Drug use: No   Sexual activity: Yes    Partners: Male    Birth control/protection:  None  Other Topics Concern   Not on file  Social History Narrative   Not on file   Social Determinants of Health   Financial Resource Strain: Not on file  Food Insecurity: Not on file  Transportation Needs: Not on file  Physical Activity: Not on file  Stress: Not on file  Social Connections: Not on file  Intimate Partner Violence: Not on file    Outpatient Medications Prior to Visit  Medication Sig Dispense Refill   ALPRAZolam (XANAX) 1 MG tablet Take one tablet twice daily. 60 tablet 2   amLODipine (NORVASC) 5 MG tablet Take 1 tablet (5 mg total) by mouth daily. 90 tablet 3   atorvastatin (LIPITOR) 10 MG tablet Take 0.5 tablets (5 mg total) by mouth daily. 15 tablet 5   busPIRone (BUSPAR) 5 MG tablet TAKE 1 TABLET(5 MG) BY MOUTH THREE TIMES DAILY 270 tablet 3   cyclobenzaprine (FLEXERIL) 10 MG tablet Take 0.5-1 tablets (5-10 mg total) by mouth 3 (three) times daily as needed for muscle spasms. 90 tablet 0   gabapentin (NEURONTIN) 300 MG capsule TAKE 1 CAPSULE BY MOUTH TWICE A DAY AND TAKE 2 CAPSULES BY MOUTH AT BEDTIME 120 capsule 2   levothyroxine (SYNTHROID) 25 MCG tablet TAKE 1 TABLET BY MOUTH EVERY DAY BEFORE BREAKFAST 30 tablet 2   lisinopril (ZESTRIL) 10 MG tablet TAKE 1 TABLET (10 MG TOTAL) BY MOUTH IN THE MORNING AND AT BEDTIME 60 tablet 2   LOW-DOSE ASPIRIN PO      metoprolol tartrate (LOPRESSOR) 100 MG tablet Take 100 mg 2 hours before Coronary CT 1 tablet 0   omeprazole (PRILOSEC) 40 MG capsule Take 1 capsule (40 mg total) by mouth daily. 90 capsule 0   ondansetron (ZOFRAN) 4 MG tablet Take 1 tablet (4 mg total) by mouth every 8 (eight) hours as needed for nausea or vomiting. 30 tablet 1   venlafaxine XR (EFFEXOR-XR) 150 MG 24 hr capsule Take one capsule every morning. 90 capsule 3   No facility-administered medications prior to visit.    Allergies  Allergen Reactions   Promethazine Anaphylaxis   Promethazine Hcl Anaphylaxis    Review of Systems  Constitutional:   Positive for malaise/fatigue. Negative for fever.  HENT:  Negative for congestion.   Eyes:  Negative for blurred vision.  Respiratory:  Negative for shortness of breath.   Cardiovascular:  Negative for chest pain, palpitations and leg swelling.  Gastrointestinal:  Negative for abdominal pain, blood in stool and nausea.  Genitourinary:  Negative for dysuria and frequency.  Musculoskeletal:  Positive for back pain, joint pain and myalgias. Negative for falls.  Skin:  Negative for rash.  Neurological:  Negative for dizziness, loss of consciousness and headaches.  Endo/Heme/Allergies:  Negative for environmental allergies.  Psychiatric/Behavioral:  Positive for depression. Negative for hallucinations, substance abuse and suicidal ideas. The patient is nervous/anxious.        Objective:    Physical Exam Constitutional:      General: She is not in acute distress.    Appearance: Normal appearance. She is well-developed. She is not toxic-appearing.  HENT:     Head: Normocephalic and atraumatic.     Right Ear: External ear normal.     Left Ear: External ear normal.     Nose: Nose normal.  Eyes:     General:        Right eye: No discharge.        Left eye: No discharge.     Conjunctiva/sclera: Conjunctivae normal.  Neck:     Thyroid: No thyromegaly.  Cardiovascular:     Rate and Rhythm: Normal rate and regular rhythm.     Heart sounds: Normal heart sounds. No murmur heard. Pulmonary:     Effort: Pulmonary effort is normal. No respiratory distress.     Breath sounds: Normal breath sounds.  Abdominal:     General: Bowel sounds are normal.     Palpations: Abdomen is soft.     Tenderness: There is no abdominal tenderness. There is no guarding.  Musculoskeletal:        General: Normal range of motion.     Cervical back: Neck supple.  Lymphadenopathy:     Cervical: No cervical adenopathy.  Skin:    General: Skin is warm and dry.  Neurological:     Mental Status: She is alert and  oriented to person, place, and time.  Psychiatric:        Mood and Affect: Mood normal.        Behavior: Behavior normal.        Thought Content: Thought content normal.        Judgment: Judgment normal.     BP 136/86 (BP Location: Left Arm, Patient Position: Sitting, Cuff Size: Normal)   Pulse (!) 102   Temp 98 F (36.7 C) (Oral)   Resp 16   Ht 4\' 11"  (1.499 m)   Wt 172 lb 12.8 oz (78.4 kg)   LMP 09/02/2015 (Approximate) Comment: continous bleeding  SpO2 98%   BMI 34.90 kg/m  Wt Readings from Last 3 Encounters:  11/04/22 172 lb 12.8 oz (78.4 kg)  10/18/22 172 lb (78 kg)  07/29/22 169 lb 12.8 oz (77 kg)    Diabetic Foot Exam - Simple   No data filed    Lab Results  Component Value Date   WBC 8.2 11/02/2022   HGB 13.1 11/02/2022   HCT 39.5 11/02/2022   PLT 360.0 11/02/2022   GLUCOSE 125 (H) 11/02/2022   CHOL 151 11/02/2022   TRIG 149.0 11/02/2022   HDL 50.60 11/02/2022   LDLDIRECT 146.0 09/19/2020   LDLCALC 71 11/02/2022   ALT 37 (H) 11/02/2022   AST 24 11/02/2022   NA 139 11/02/2022   K 3.4 (L) 11/02/2022   CL 101 11/02/2022   CREATININE 0.71 11/02/2022   BUN 5 (L) 11/02/2022   CO2 29 11/02/2022   TSH 1.32 11/02/2022   HGBA1C 6.6 (H) 11/02/2022    Lab Results  Component Value Date   TSH 1.32 11/02/2022   Lab Results  Component Value Date   WBC 8.2 11/02/2022   HGB 13.1 11/02/2022   HCT 39.5 11/02/2022   MCV 90.1 11/02/2022   PLT 360.0 11/02/2022  Lab Results  Component Value Date   NA 139 11/02/2022   K 3.4 (L) 11/02/2022   CO2 29 11/02/2022   GLUCOSE 125 (H) 11/02/2022   BUN 5 (L) 11/02/2022   CREATININE 0.71 11/02/2022   BILITOT 0.4 11/02/2022   ALKPHOS 141 (H) 11/02/2022   AST 24 11/02/2022   ALT 37 (H) 11/02/2022   PROT 7.6 11/02/2022   ALBUMIN 4.3 11/02/2022   CALCIUM 9.3 11/02/2022   ANIONGAP 12 01/17/2018   EGFR 89 10/18/2022   GFR 96.72 11/02/2022   Lab Results  Component Value Date   CHOL 151 11/02/2022   Lab Results   Component Value Date   HDL 50.60 11/02/2022   Lab Results  Component Value Date   LDLCALC 71 11/02/2022   Lab Results  Component Value Date   TRIG 149.0 11/02/2022   Lab Results  Component Value Date   CHOLHDL 3 11/02/2022   Lab Results  Component Value Date   HGBA1C 6.6 (H) 11/02/2022       Assessment & Plan:  Type 2 diabetes mellitus with hyperlipidemia (HCC) Assessment & Plan: hgba1c acceptable, minimize simple carbs. Increase exercise as tolerated. Continue current meds   Orders: -     Comprehensive metabolic panel; Future -     Hemoglobin A1c; Future  Mixed hyperlipidemia Assessment & Plan: Well controlled, no changes to meds. Encouraged heart healthy diet such as the DASH diet and exercise as tolerated.   Orders: -     Lipid panel; Future  Primary hypertension Assessment & Plan: Well controlled, no changes to meds. Encouraged heart healthy diet such as the DASH diet and exercise as tolerated.    Orders: -     CBC with Differential/Platelet; Future -     TSH; Future  Hypothyroidism, unspecified type Assessment & Plan: On Levothyroxine, continue to monitor   Depression with anxiety Assessment & Plan: Tolerating current meds   Other orders -     Tirzepatide; Inject 2.5 mg into the skin once a week.  Dispense: 2 mL; Refill: 1    Assessment and Plan    Lower Back Pain with Radiculopathy Chronic lower back pain radiating down the right leg, more than the left, extending to the right ankle. Recent chiropractic x-ray with recommendation for decompression therapy. Patient reports difficulty with prolonged walking and standing due to pain. -Consider MRI of the lumbar spine to further evaluate the cause of radiculopathy. -Start Tizanidine for muscle relaxation and pain control, titrating dose as tolerated.  Fibromyalgia Chronic widespread musculoskeletal pain and fatigue. Patient reports significant fatigue even with minimal activity. -Continue  current management plan.  Prediabetes Recent A1C of 6.6, meeting the threshold for diabetes diagnosis. Patient has expressed interest in weight loss. -Start Mounjaro 2.5mg  weekly, with potential to increase dose as tolerated. -Provide glucometer for home glucose monitoring. -Schedule follow-up appointment in three months to reassess A1C and response to [facility].  Hypertension Managed with Lisinopril and recently added Amlodipine (Norvasc). No reported side effects. -Continue current management plan.  General Health Maintenance -Recent administration of COVID-19 and influenza vaccines. Patient reports significant pain flare following COVID-19 vaccination. -Consider Novarax COVID-19 vaccine next year due to different mechanism of action. -Scheduled for coronary CT scan.         Danise Edge, MD

## 2022-11-08 ENCOUNTER — Other Ambulatory Visit (HOSPITAL_COMMUNITY): Payer: Self-pay

## 2022-11-08 ENCOUNTER — Telehealth: Payer: Self-pay

## 2022-11-08 NOTE — Telephone Encounter (Signed)
Pharmacy Patient Advocate Encounter   Received notification from Patient Advice Request messages that prior authorization for Mounjaro 2.5MG /0.5ML auto-injectors is required/requested.   Insurance verification completed.   The patient is insured through [employer] [facility] .   Per test claim: PA required; PA submitted to [employer] [facility] via CoverMyMeds Key/confirmation #/[id number] Status is pending

## 2022-11-09 ENCOUNTER — Ambulatory Visit (HOSPITAL_COMMUNITY)
Admission: RE | Admit: 2022-11-09 | Discharge: 2022-11-09 | Disposition: A | Discharge status: Home / Self Care | Payer: 59 | Source: Ambulatory Visit | Attending: Cardiology | Admitting: Cardiology

## 2022-11-09 ENCOUNTER — Telehealth: Payer: Self-pay

## 2022-11-09 DIAGNOSIS — R072 Precordial pain: Secondary | ICD-10-CM | POA: Diagnosis not present

## 2022-11-09 MED ORDER — NITROGLYCERIN 0.4 MG SL SUBL
SUBLINGUAL_TABLET | SUBLINGUAL | Status: AC
  Filled 2022-11-09: qty 2

## 2022-11-09 MED ORDER — NITROGLYCERIN 0.4 MG SL SUBL
0.8000 mg | SUBLINGUAL_TABLET | Freq: Once | SUBLINGUAL | Status: AC
  Administered 2022-11-09: 0.8 mg via SUBLINGUAL

## 2022-11-09 MED ORDER — IOHEXOL 350 MG/ML SOLN
95.0000 mL | Freq: Once | INTRAVENOUS | Status: AC | PRN
  Administered 2022-11-09: 95 mL via INTRAVENOUS

## 2022-11-09 NOTE — Telephone Encounter (Signed)
Spoke with patient and scheduled OV for 11/18/22.

## 2022-11-09 NOTE — Telephone Encounter (Signed)
Pharmacy Patient Advocate Encounter  Received notification from [employer] [facility] that Prior Authorization for [name] 2.5MG /0.5ML  has been DENIED.  Full denial letter will be uploaded to the media tab. See denial reason below.   PA #/Case ID/Reference #: [id number]    DENIAL REASON: Your plan only covers this drug when you meet one of these options: A) You have tried other products your plan covers (preferred products), and they did not work well for you, or B) Your doctor gives [place] a medical reason you cannot take those other products. The preferred products for your plan are: Victoza, Trulicity,liraglutide. (Requirement: 3 in a class with 3 or more alternatives, 2 in a class with 2 alternatives, or 1 in a class with only 1 alternative.)

## 2022-11-09 NOTE — Telephone Encounter (Signed)
-----   Message from [place], [place] E sent at 11/05/2022  8:12 AM EDT ----- Can we schedule cervical MRI review? Thanks ----- Message ----- From: Interface, Rad Results In Sent: 11/02/2022   3:39 PM EDT To: Juanda Chance, NP

## 2022-11-15 ENCOUNTER — Other Ambulatory Visit: Payer: Self-pay | Admitting: Family Medicine

## 2022-11-16 DIAGNOSIS — R000 Tachycardia, unspecified: Secondary | ICD-10-CM | POA: Diagnosis not present

## 2022-11-16 DIAGNOSIS — R072 Precordial pain: Secondary | ICD-10-CM | POA: Diagnosis not present

## 2022-11-18 ENCOUNTER — Encounter: Payer: Self-pay | Admitting: Physical Medicine and Rehabilitation

## 2022-11-18 ENCOUNTER — Ambulatory Visit: Payer: 59 | Admitting: Physical Medicine and Rehabilitation

## 2022-11-18 DIAGNOSIS — M542 Cervicalgia: Secondary | ICD-10-CM | POA: Diagnosis not present

## 2022-11-18 DIAGNOSIS — M7918 Myalgia, other site: Secondary | ICD-10-CM

## 2022-11-18 DIAGNOSIS — M5412 Radiculopathy, cervical region: Secondary | ICD-10-CM | POA: Diagnosis not present

## 2022-11-18 DIAGNOSIS — M797 Fibromyalgia: Secondary | ICD-10-CM

## 2022-11-18 MED ORDER — DIAZEPAM 5 MG PO TABS
ORAL_TABLET | ORAL | 0 refills | Status: DC
Start: 2022-11-18 — End: 2023-05-10

## 2022-11-18 NOTE — Progress Notes (Unsigned)
Lauren Hall - 52 y.o. female MRN [id number]  Date of birth: [birth year]  Office Visit Note: Visit Date: 11/18/2022 PCP: Bradd Canary, MD Referred by: Bradd Canary, MD  Subjective: Chief Complaint  Patient presents with  . Neck - Pain   HPI: Hadlie KYMONI Hall is a 52 y.o. female who comes in today for evaluation of chronic, worsening and severe right sided neck pain radiating down right arm to fingers. Pain ongoing for several years, worsens with movement and activity. She describes pain as dull, aching and tingling sensation, currently rates as 8 out of 10. Some relief of pain with rest and medications. History of multiple regimens of chiropractic treatments, most recent with [facility] with minimal relief of pain. Recent cervical MRI imaging exhibits moderate right neuroforaminal stenosis from C3-C4 through C5-C6. No high grade spinal canal stenosis. She reports history of Chiari Malformation, she is not currently being followed by neurosurgery. Also reports history of fibromyalgia, she has tried Cymbalta in the past and was unable to tolerate. Patient was started on Gabapentin and Flexeril by PCP several months ago with minimal relief of pain. Patient denies focal weakness, numbness and tingling. No recent trauma or falls.      Review of Systems  Musculoskeletal:  Positive for myalgias and neck pain.  Neurological:  Negative for sensory change, focal weakness and weakness.  All other systems reviewed and are negative.  Otherwise per HPI.  Assessment & Plan: Visit Diagnoses:    ICD-10-CM   1. Radiculopathy, cervical region  M54.12 Ambulatory referral to Physical Medicine Rehab    2. Cervicalgia  M54.2 Ambulatory referral to Physical Medicine Rehab    3. Myofascial pain syndrome  M79.18 Ambulatory referral to Physical Medicine Rehab    4. Fibromyalgia  M79.7 Ambulatory referral to Physical Medicine Rehab       Plan: Findings:  Chronic, worsening and severe  right sided neck pain radiating down right arm to fingers. Patient continues to have severe pain despite good conservative therapies such as chiropractic treatments, home exercise regimen, rest and use of medications. Patients clinical presentation and exam are consistent with cervical radiculopathy, I also feel her fibromyalgia is working to exacerbate her pain. Next step is to perform diagnostic and hopefully therapeutic right C7-T1 interlaminar epidural steroid injection. I discussed injection procedure with her today in detail. She did voice issues with anxiety related to injection, I prescribed pre-procedure Valium for her to take on day of injection. Patient did inquire about treatment of Chiari Malformation and chronic right hip issues. I recommended patient speak with PCP regarding further referrals to possible neurosurgery and general orthopedics. Patient has no questions at this time. We will see her back for injection. No red flag symptoms noted upon exam today.     Meds & Orders:  Meds ordered this encounter  Medications  . diazepam (VALIUM) 5 MG tablet    Sig: Take one tablet by mouth with food one hour prior to procedure. May repeat 30 minutes prior if needed.    Dispense:  2 tablet    Refill:  0    Orders Placed This Encounter  Procedures  . Ambulatory referral to Physical Medicine Rehab    Follow-up: Return for Right C7-T1 interlaminar epidural steroid injection.   Procedures: No procedures performed      Clinical History: CLINICAL DATA:  Chronic neck pain radiating into the right arm with right hand numbness for the past 2 months. No injury or prior surgery.  EXAM: MRI CERVICAL SPINE WITHOUT CONTRAST   TECHNIQUE: Multiplanar, multisequence MR imaging of the cervical spine was performed. No intravenous contrast was administered.   COMPARISON:  Cervical spine x-rays dated July 13, 2022.   FINDINGS: Alignment: Straightening of the normal cervical lordosis.  No significant listhesis.   Vertebrae: No fracture, evidence of discitis, or bone lesion.   Cord: Normal signal and morphology.   Posterior Fossa, vertebral arteries, paraspinal tissues: Negative.   Disc levels:   C2-C3: Negative disc. Mild bilateral facet arthropathy. No stenosis.   C3-C4: Mild disc bulging and right uncovertebral hypertrophy. Moderate right neuroforaminal stenosis. No spinal canal or left neuroforaminal stenosis.   C4-C5: Small broad-based posterior disc osteophyte complex and bilateral uncovertebral hypertrophy. Mild to moderate right neuroforaminal stenosis. No spinal canal or left neuroforaminal stenosis.   C5-C6: Mild disc bulging with superimposed right foraminal disc protrusion. Right uncovertebral hypertrophy. Moderate right neuroforaminal stenosis. No spinal canal or left neuroforaminal stenosis.   C6-C7: Mild disc bulging and bilateral uncovertebral hypertrophy. No stenosis.   C7-T1: Negative disc. Mild bilateral facet arthropathy. No stenosis.   IMPRESSION: 1. Multilevel degenerative changes of the cervical spine as described above. Moderate right neuroforaminal stenosis from C3-C4 through C5-C6.     Electronically Signed   By: Obie Dredge M.D.   On: 11/02/2022 15:37   She reports that she has never smoked. She has never used smokeless tobacco.  Recent Labs    07/27/22 1558 11/02/22 1147  HGBA1C 6.2 6.6*    Objective:  VS:  HT:    WT:   BMI:     BP:   HR: bpm  TEMP: ( )  RESP:  Physical Exam Vitals and nursing note reviewed.  HENT:     Head: Normocephalic and atraumatic.     Right Ear: External ear normal.     Left Ear: External ear normal.     Nose: Nose normal.     Mouth/Throat:     Mouth: Mucous membranes are moist.  Eyes:     Extraocular Movements: Extraocular movements intact.  Cardiovascular:     Rate and Rhythm: Normal rate.     Pulses: Normal pulses.  Pulmonary:     Effort: Pulmonary effort is normal.   Abdominal:     General: Abdomen is flat. There is no distension.  Musculoskeletal:        General: Tenderness present.     Cervical back: Tenderness present.     Comments: No discomfort noted with flexion, extension and side-to-side rotation. Patient has good strength in the upper extremities including 5 out of 5 strength in wrist extension, long finger flexion and APB. Shoulder range of motion is full bilaterally without any sign of impingement. There is no atrophy of the hands intrinsically. Sensation intact bilaterally. Tenderness noted to bilateral levator scapulae and trapezius regions on exam today. Negative Hoffman's sign. Negative Spurling's sign.     Skin:    General: Skin is warm and dry.     Capillary Refill: Capillary refill takes less than 2 seconds.  Neurological:     General: No focal deficit present.     Mental Status: She is alert and oriented to person, place, and time.  Psychiatric:        Mood and Affect: Mood normal.        Behavior: Behavior normal.    Ortho Exam  Imaging: No results found.  Past Medical/Family/Surgical/Social History: Medications & Allergies reviewed per EMR, new medications updated. Patient Active Problem List  Diagnosis Date Noted  . Atypical chest pain 08/01/2022  . COVID-19 03/31/2022  . Elevated liver enzymes 07/15/2021  . RUQ pain 11/14/2020  . Heartburn 11/14/2020  . Uterine leiomyoma   . Enlarged thyroid   . Dysrhythmia   . Complication of anesthesia   . Anxiety   . Allergy   . Grief reaction 05/03/2019  . Hot flashes 02/07/2017  . Degenerative cervical disc 09/16/2016  . Nonallopathic lesion of cervical region 09/16/2016  . Nonallopathic lesion of thoracic region 09/16/2016  . Nonallopathic lesion of lumbosacral region 09/16/2016  . Cervical radicular pain 09/09/2016  . S/P laparoscopic hysterectomy 10/07/2015  . Type 2 diabetes mellitus with hyperlipidemia (HCC) 08/11/2015  . 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  Low back pain radiating to right leg 03/11/2015  . Pain in joint, lower leg 11/18/2013  . Peripheral neuropathy 11/11/2012  . Tinea corporis 11/11/2012  . Hyperlipidemia 07/29/2012  . Preventative health care 07/29/2012  . Sinusitis, acute 07/27/2011  . Bronchitis 07/27/2011  . Tachycardia 03/02/2011  . Poor concentration 01/11/2011  . Hip pain, bilateral 11/06/2010  . Fatigue 10/08/2010  . Headache 10/08/2010  . Hypothyroidism 10/08/2010  . Thyroid disease 10/08/2010  . Fibromyalgia   . Anemia   . Allergy   . Depression with anxiety   . Hypertension    Past Medical History:  Diagnosis Date  . Allergy   . Anemia   . Anxiety   . 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 Bronchitis 07/27/2011  . 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 Complication of anesthesia    patient woke up while they were sewing up her incision with breast lumpectomy  . Depression   . Dysrhythmia    Tachycardia  . Enlarged thyroid   . Fatigue 10/08/2010  . Fibromyalgia 2009  . GERD (gastroesophageal reflux disease) 09/26/2013  . Headache(784.0) 10/08/2010   Migraines  . Hip pain, bilateral 11/06/2010  . Hyperglycemia 08/11/2015   normal Hgb A 1C per patient  . Hypertension   . Hypothyroidism 10/08/2010  . Low back pain radiating to right leg 03/11/2015  . Other and unspecified hyperlipidemia 07/29/2012  . Peripheral neuropathy 11/11/2012   burning sensation mainly right leg  . Poor concentration 01/11/2011  . Preventative health care 07/29/2012  . S/P laparoscopic hysterectomy 10/07/2015  . Sinusitis, acute 07/27/2011  . Tachycardia   . Thyroid disease 10/08/2010  . Tick bite of flank 11/09/2011  . Tinea corporis 11/11/2012  . Uterine leiomyoma    Family History  Problem Relation Age of Onset  . Fibromyalgia Mother   . Diabetes Mother        type 2  . Hypertension Mother   . Hyperlipidemia Mother   . Arthritis Mother   . Alcohol abuse Father   . Colon cancer Father   . Hypertension Brother   . Alcohol  abuse Brother   . Heart disease Maternal Grandmother   . Hyperlipidemia Maternal Grandmother   . Diabetes Maternal Grandmother        type 2  . Colon cancer Maternal Grandmother   . Stroke Maternal Grandfather   . Diabetes Maternal Grandfather        type 2  . Ovarian cancer Paternal Grandmother   . Alzheimer's disease Paternal Grandfather    Past Surgical History:  Procedure Laterality Date  . BREAST LUMPECTOMY Right 1999   benign   . CYSTOSCOPY N/A 10/07/2015   Procedure: CYSTOSCOPY;  Surgeon: Essie Hart, MD;  Location: [facility];  Service: Gynecology;  Laterality:  N/A;   Social History   Occupational History  . Not on file  Tobacco Use  . Smoking status: Never  . Smokeless tobacco: Never  Substance and Sexual Activity  . Alcohol use: Yes    Comment: special occassion- very rarely  . Drug use: No  . Sexual activity: Yes    Partners: Male    Birth control/protection: None

## 2022-11-24 ENCOUNTER — Other Ambulatory Visit: Payer: Self-pay | Admitting: Family Medicine

## 2022-11-24 ENCOUNTER — Encounter: Payer: Self-pay | Admitting: Family Medicine

## 2022-11-24 MED ORDER — TRULICITY 0.75 MG/0.5ML ~~LOC~~ SOAJ: 0.7500 mg | SUBCUTANEOUS | 1 refills | Status: DC

## 2022-11-24 NOTE — Progress Notes (Signed)
[name] [address] [place] [address] Phone: [phone] Subjective:   Bruce Donath, am serving as a scribe for Dr. Antoine Primas.  I'm seeing this patient by the request  of:  Bradd Canary, MD  CC: low back pain   [id number]  Mirage Kirtland Bouchard Wingrove is a 52 y.o. female coming in with complaint of LBP. Pain from the R SI joint that radiates into the lateral aspect of hip down into the lower leg and foot. Pain is chronic but worsening over time. Patient takes OTC meds, gabapentin or topicals for pain relief. Having fibromyalgia flare. Started prednisone today.      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   Complication of anesthesia    patient woke up while they were sewing up her incision with breast lumpectomy   Depression    Diabetes (HCC)    Dysrhythmia    Tachycardia   Enlarged thyroid    Fatigue 10/08/2010   Fibromyalgia 2009   GERD (gastroesophageal reflux disease) 09/26/2013   Headache(784.0) 10/08/2010   Migraines   Hip pain, bilateral 11/06/2010   Hyperglycemia 08/11/2015   normal Hgb A 1C per patient   Hypertension    Hypothyroidism 10/08/2010   Low back pain radiating to right leg 03/11/2015   Other and unspecified hyperlipidemia 07/29/2012   Peripheral neuropathy 11/11/2012   burning sensation mainly right leg   Poor concentration 01/11/2011   Preventative health care 07/29/2012   S/P laparoscopic hysterectomy 10/07/2015   Sinusitis, acute 07/27/2011   Tachycardia    Thyroid disease 10/08/2010   Tick bite of flank 11/09/2011   Tinea corporis 11/11/2012   Uterine leiomyoma    Past Surgical History:  Procedure Laterality Date   BREAST LUMPECTOMY Right 1999   benign    CYSTOSCOPY N/A 10/07/2015   Procedure: CYSTOSCOPY;  Surgeon: Essie Hart, MD;  Location: [facility];  Service:  Gynecology;  Laterality: N/A;   Social History   Socioeconomic History   Marital status: Married    Spouse name: Derrick   Number of children: Not on file   Years of education: Not on file   Highest education level: Not on file  Occupational History   Not on file  Tobacco Use   Smoking status: Never   Smokeless tobacco: Never  Substance and Sexual Activity   Alcohol use: Yes    Comment: special occassion- very rarely   Drug use: No   Sexual activity: Yes    Partners: Male    Birth control/protection: None  Other Topics Concern   Not on file  Social History Narrative   Not on file   Social Determinants of Health   Financial Resource Strain: Not on file  Food Insecurity: Not on file  Transportation Needs: Not on file  Physical Activity: Not on file  Stress: Not on file  Social Connections: Not on file   Allergies  Allergen Reactions   Promethazine Anaphylaxis   Promethazine Hcl Anaphylaxis   Family History  Problem Relation Age of Onset   Fibromyalgia Mother    Diabetes Mother        type 2   Hypertension Mother    Hyperlipidemia Mother    Arthritis Mother    Alcohol abuse Father    Colon cancer Father    Hypertension Brother  Alcohol abuse Brother    Heart disease Maternal Grandmother    Hyperlipidemia Maternal Grandmother    Diabetes Maternal Grandmother        type 2   Colon cancer Maternal Grandmother    Stroke Maternal Grandfather    Diabetes Maternal Grandfather        type 2   Ovarian cancer Paternal Grandmother    Alzheimer's disease Paternal Grandfather     Current Outpatient Medications (Endocrine & Metabolic):    Dulaglutide (TRULICITY) 0.75 MG/0.5ML SOAJ, Inject 0.75 mg into the skin once a week.   levothyroxine (SYNTHROID) 25 MCG tablet, TAKE 1 TABLET BY MOUTH EVERY DAY BEFORE BREAKFAST   methylPREDNISolone (MEDROL) 4 MG tablet, 6 tabs po x 1 day then 5 tabs po x 1 day then 4 tabs po x 1 day then 3 tabs po x 1 day then 2 tabs po x 1 day  then 1 tab po x 1 day and stop  Current Outpatient Medications (Cardiovascular):    amLODipine (NORVASC) 5 MG tablet, Take 1 tablet (5 mg total) by mouth daily.   atorvastatin (LIPITOR) 10 MG tablet, Take 0.5 tablets (5 mg total) by mouth daily.   lisinopril (ZESTRIL) 10 MG tablet, TAKE 1 TABLET (10 MG TOTAL) BY MOUTH IN THE MORNING AND AT BEDTIME   metoprolol tartrate (LOPRESSOR) 100 MG tablet, Take 100 mg 2 hours before Coronary CT   Current Outpatient Medications (Analgesics):    LOW-DOSE ASPIRIN PO,    Current Outpatient Medications (Other):    ALPRAZolam (XANAX) 1 MG tablet, Take one tablet twice daily.   Blood Glucose Monitoring Suppl DEVI, 1 each by Does not apply route in the morning, at noon, and at bedtime. May substitute to any manufacturer covered by patient's insurance.   busPIRone (BUSPAR) 5 MG tablet, TAKE 1 TABLET(5 MG) BY MOUTH THREE TIMES DAILY   cyclobenzaprine (FLEXERIL) 10 MG tablet, Take 0.5-1 tablets (5-10 mg total) by mouth 3 (three) times daily as needed for muscle spasms.   diazepam (VALIUM) 5 MG tablet, Take one tablet by mouth with food one hour prior to procedure. May repeat 30 minutes prior if needed.   gabapentin (NEURONTIN) 300 MG capsule, TAKE 1 CAPSULE BY MOUTH TWICE A DAY AND TAKE 2 CAPSULES BY MOUTH AT BEDTIME   Glucose Blood (BLOOD GLUCOSE TEST STRIPS) STRP, 1 each by In Vitro route in the morning, at noon, and at bedtime. May substitute to any manufacturer covered by patient's insurance.   Lancet Device MISC, 1 each by Does not apply route in the morning, at noon, and at bedtime. May substitute to any manufacturer covered by patient's insurance.   Lancets Misc. MISC, 1 each by Does not apply route in the morning, at noon, and at bedtime. May substitute to any manufacturer covered by patient's insurance.   omeprazole (PRILOSEC) 40 MG capsule, Take 1 capsule (40 mg total) by mouth daily.   ondansetron (ZOFRAN) 4 MG tablet, Take 1 tablet (4 mg total) by  mouth every 8 (eight) hours as needed for nausea or vomiting.   venlafaxine XR (EFFEXOR-XR) 150 MG 24 hr capsule, Take one capsule every morning.   Reviewed prior external information including notes and imaging from  primary care provider As well as notes that were available from care everywhere and other healthcare systems.  Past medical history, social, surgical and family history all reviewed in electronic medical record.  No pertanent information unless stated regarding to the chief complaint.   Review of Systems:  No headache, visual changes, nausea, vomiting, diarrhea, constipation, dizziness, abdominal pain, skin rash, fevers, chills, night sweats, weight loss, swollen lymph nodes, body aches, joint swelling, chest pain, shortness of breath, mood changes. POSITIVE muscle aches  Objective  Blood pressure 116/70, pulse (!) 103, height 4\' 11"  (1.499 m), weight 170 lb (77.1 kg), last menstrual period 09/02/2015, SpO2 96%.   General: No apparent distress alert and oriented x3 mood and affect normal, dressed appropriately.  HEENT: Pupils equal, extraocular movements intact  Respiratory: Patient's speak in full sentences and does not appear short of breath  Cardiovascular: No lower extremity edema, non tender, no erythema  Low back exam shows tightness noted in the right side of lumbar spine and the right SI joint  Positive Pearlean Brownie on the right side.  Tender to palpation.  Osteopathic findings  T9 extended rotated and side bent left L1 flexed rotated and side bent left L2 flexed rotated and side bent right Sacrum right on right    Impression and Recommendations:    The above documentation has been reviewed and is accurate and complete Judi Saa, DO

## 2022-11-26 ENCOUNTER — Encounter: Payer: Self-pay | Admitting: Adult Health

## 2022-11-26 ENCOUNTER — Telehealth (INDEPENDENT_AMBULATORY_CARE_PROVIDER_SITE_OTHER): Payer: 59 | Admitting: Adult Health

## 2022-11-26 DIAGNOSIS — F411 Generalized anxiety disorder: Secondary | ICD-10-CM | POA: Diagnosis not present

## 2022-11-26 DIAGNOSIS — G4700 Insomnia, unspecified: Secondary | ICD-10-CM

## 2022-11-26 DIAGNOSIS — F410 Panic disorder [episodic paroxysmal anxiety] without agoraphobia: Secondary | ICD-10-CM | POA: Diagnosis not present

## 2022-11-26 DIAGNOSIS — F331 Major depressive disorder, recurrent, moderate: Secondary | ICD-10-CM | POA: Diagnosis not present

## 2022-11-26 MED ORDER — ALPRAZOLAM 1 MG PO TABS: ORAL_TABLET | ORAL | 2 refills | Status: DC

## 2022-11-26 NOTE — Progress Notes (Signed)
Lauren Hall [id number] [birth year] 52 y.o.  Subjective:   Patient ID:  Lauren Hall is a 52 y.o. (DOB 1970-11-20) female.  Chief Complaint: No chief complaint on file.   HPI Lauren Hall presents to the office today for follow-up of MDD, GAD and insomnia and panic attacks.  Describes mood today as "not the best". Pleasant. Tearful at times. Mood symptoms - reports depression and anxiety. Reports irritability - "situational". Reports panic attacks. Reports worry, rumination, and over thinking. Reports ongoing situational stressors - husband and daughter. Reports health issues - "recently diagnosed with diabetes". Mood is variable. Stating "I feel like I'm in a flare - feeling miserable". Feels like medications are helpful. Working with a therapist - [name]. Improved interest and motivation. Taking medications as prescribed. Energy levels lower. Active, does not have a regular exercise routine with physical disabilities.  Enjoys some usual interests and activities. Married. Lives with husband and daughter. Appetite adequate. Weight gain - 172 pounds. Sleeping better some nights than others.  Focus and concentration difficulties - "fibro fog". Completing tasks. Managing some aspects of household. 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  Denies AH or VH. Denies self harm. Denies substance use.  Previous medication trials: Pristiq, Wellbutrin, Prozac, Cymbalta, Lexapro, Zoloft,    GAD-7    Flowsheet Row Office Visit from 07/29/2022 in [facility] at [facility] Office Visit from 03/30/2022 in [facility] Primary Care at [facility] Office Visit from 07/14/2021 in [facility] Primary Care at [facility]  Total GAD-7 Score 0 0 15      PHQ2-9    Flowsheet Row Office Visit from 11/04/2022 in [facility] Primary [facility] at [facility] Office Visit from 07/29/2022 in [facility] Primary Care at  [facility] Office Visit from 03/30/2022 in [facility] Primary Care at [facility] Office Visit from 07/14/2021 in [facility]  PHQ-2 Total Score 0 0 0 6  PHQ-9 Total Score -- 0 0 17        Review of Systems:  Review of Systems  Musculoskeletal:  Negative for gait problem.  Neurological:  Negative for tremors.  Psychiatric/Behavioral:         Please refer to HPI    Medications: I have reviewed the patient's current medications.  Current Outpatient Medications  Medication Sig Dispense Refill   ALPRAZolam (XANAX) 1 MG tablet Take one tablet twice daily. 60 tablet 2   amLODipine (NORVASC) 5 MG tablet Take 1 tablet (5 mg total) by mouth daily. 90 tablet 3   atorvastatin (LIPITOR) 10 MG tablet Take 0.5 tablets (5 mg total) by mouth daily. 15 tablet 5   Blood Glucose Monitoring Suppl DEVI 1 each by Does not apply route in the morning, at noon, and at bedtime. May substitute to any manufacturer covered by patient's insurance. 1 each 0   busPIRone (BUSPAR) 5 MG tablet TAKE 1 TABLET(5 MG) BY MOUTH THREE TIMES DAILY 270 tablet 3   cyclobenzaprine (FLEXERIL) 10 MG tablet Take 0.5-1 tablets (5-10 mg total) by mouth 3 (three) times daily as needed for muscle spasms. 90 tablet 0   diazepam (VALIUM) 5 MG tablet Take one tablet by mouth with food one hour prior to procedure. May repeat 30 minutes prior if needed. 2 tablet 0   Dulaglutide (TRULICITY) 0.75 MG/0.5ML SOAJ Inject 0.75 mg into the skin once a week.  2 mL 1   gabapentin (NEURONTIN) 300 MG capsule TAKE 1 CAPSULE BY MOUTH TWICE A DAY AND TAKE 2 CAPSULES BY MOUTH AT BEDTIME 120 capsule 2   Glucose Blood (BLOOD GLUCOSE TEST STRIPS) STRP 1 each by In Vitro route in the morning, at noon, and at bedtime. May substitute to any manufacturer covered by patient's insurance. 100 each 2   Lancet Device MISC 1 each by Does not apply route in the morning, at noon, and at bedtime. May  substitute to any manufacturer covered by patient's insurance. 1 each 0   Lancets Misc. MISC 1 each by Does not apply route in the morning, at noon, and at bedtime. May substitute to any manufacturer covered by patient's insurance. 100 each 0   levothyroxine (SYNTHROID) 25 MCG tablet TAKE 1 TABLET BY MOUTH EVERY DAY BEFORE BREAKFAST 30 tablet 2   lisinopril (ZESTRIL) 10 MG tablet TAKE 1 TABLET (10 MG TOTAL) BY MOUTH IN THE MORNING AND AT BEDTIME 60 tablet 2   LOW-DOSE ASPIRIN PO      metoprolol tartrate (LOPRESSOR) 100 MG tablet Take 100 mg 2 hours before Coronary CT 1 tablet 0   omeprazole (PRILOSEC) 40 MG capsule Take 1 capsule (40 mg total) by mouth daily. 90 capsule 0   ondansetron (ZOFRAN) 4 MG tablet Take 1 tablet (4 mg total) by mouth every 8 (eight) hours as needed for nausea or vomiting. 30 tablet 1   venlafaxine XR (EFFEXOR-XR) 150 MG 24 hr capsule Take one capsule every morning. 90 capsule 3   No current facility-administered medications for this visit.    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   Complication of anesthesia    patient woke up while they were sewing up her incision with breast lumpectomy   Depression    Dysrhythmia    Tachycardia   Enlarged thyroid    Fatigue 10/08/2010   Fibromyalgia 2009   GERD (gastroesophageal reflux disease) 09/26/2013   Headache(784.0) 10/08/2010   Migraines   Hip pain, bilateral 11/06/2010   Hyperglycemia 08/11/2015   normal Hgb A 1C per patient   Hypertension    Hypothyroidism 10/08/2010   Low back pain radiating to right leg 03/11/2015   Other and unspecified hyperlipidemia 07/29/2012   Peripheral neuropathy 11/11/2012   burning sensation mainly right leg   Poor  concentration 01/11/2011   Preventative health care 07/29/2012   S/P laparoscopic hysterectomy 10/07/2015   Sinusitis, acute 07/27/2011   Tachycardia    Thyroid disease 10/08/2010   Tick bite of flank 11/09/2011   Tinea corporis 11/11/2012   Uterine leiomyoma     Past Medical History, Surgical history, Social history, and Family history were reviewed and updated as appropriate.   Please see review of systems for further details on the patient's review from today.   Objective:   Physical Exam:  LMP 09/02/2015 (Approximate) Comment: continous bleeding  Physical Exam Constitutional:      General: She is not in acute distress. Musculoskeletal:        General: No deformity.  Neurological:     Mental Status: She is alert and oriented to person, place, and time.     Coordination: Coordination normal.  Psychiatric:  Attention and Perception: Attention and perception normal. She does not perceive auditory or visual hallucinations.        Mood and Affect: Affect is not labile, blunt, angry or inappropriate.        Speech: Speech normal.        Behavior: Behavior normal.        Thought Content: Thought content normal. Thought content is not paranoid or delusional. Thought content does not include homicidal or suicidal ideation. Thought content does not include homicidal or suicidal plan.        Cognition and Memory: Cognition and memory normal.        Judgment: Judgment normal.     Comments: Insight intact     Lab Review:     Component Value Date/Time   NA 139 11/02/2022 1147   NA 142 10/18/2022 0000   K 3.4 (L) 11/02/2022 1147   CL 101 11/02/2022 1147   CO2 29 11/02/2022 1147   GLUCOSE 125 (H) 11/02/2022 1147   BUN 5 (L) 11/02/2022 1147   BUN 5 (L) 10/18/2022 0000   CREATININE 0.71 11/02/2022 1147   CREATININE 0.79 09/24/2013 1544   CALCIUM 9.3 11/02/2022 1147   PROT 7.6 11/02/2022 1147   ALBUMIN 4.3 11/02/2022 1147   AST 24 11/02/2022 1147   ALT 37 (H) 11/02/2022 1147    ALKPHOS 141 (H) 11/02/2022 1147   BILITOT 0.4 11/02/2022 1147   GFRNONAA >60 01/17/2018 0128   GFRAA >60 01/17/2018 0128       Component Value Date/Time   WBC 8.2 11/02/2022 1147   RBC 4.39 11/02/2022 1147   HGB 13.1 11/02/2022 1147   HCT 39.5 11/02/2022 1147   PLT 360.0 11/02/2022 1147   MCV 90.1 11/02/2022 1147   MCH 30.4 01/17/2018 0128   MCHC 33.3 11/02/2022 1147   RDW 13.5 11/02/2022 1147   LYMPHSABS 3.3 11/02/2022 1147   MONOABS 0.6 11/02/2022 1147   EOSABS 0.2 11/02/2022 1147   BASOSABS 0.1 11/02/2022 1147    No results found for: "POCLITH", "LITHIUM"   No results found for: "PHENYTOIN", "PHENOBARB", "VALPROATE", "CBMZ"   .res Assessment: Plan:     Plan:  PDMP reviewed  1. Buspar 5mg  TID 2. Effexor XR 150mg  daily   3. Xanax 1mg  BID  Therapist - [name]  RTC 3 months  Patient advised to contact office with any questions, adverse effects, or acute worsening in signs and symptoms.  Discussed potential benefits, risk, and side effects of benzodiazepines to include potential risk of tolerance and dependence, as well as possible drowsiness.  Advised patient not to drive if experiencing drowsiness and to take lowest possible effective dose to minimize risk of dependence and tolerance.  Discussed potential metabolic side effects associated with atypical antipsychotics, as well as potential risk for movement side effects. Advised pt to contact office if movement side effects occur.   There are no diagnoses linked to this encounter.   Please see After Visit Summary for patient specific instructions.  Future Appointments  Date Time Provider Department Center  11/26/2022  2:40 PM Faven Watterson, Thereasa Solo, NP CP-CP None  11/29/2022  3:20 PM Danis, Andreas Blower, MD [facility]  11/30/2022 12:45 PM Judi Saa, DO [facility] None  12/01/2022  3:30 PM [facility] PHARMACIST [facility] None  12/02/2022  2:30 PM Tyrell Antonio, MD [facility] None   12/10/2022 10:15 AM Lennart Pall, MD [facility] None  02/08/2023  3:00 PM Bradd Canary, MD [facility]  02/15/2023  3:20  PM Little Ishikawa, MD [facility] None    No orders of the defined types were placed in this encounter.   -------------------------------

## 2022-11-29 ENCOUNTER — Ambulatory Visit: Payer: 59 | Admitting: Gastroenterology

## 2022-11-29 ENCOUNTER — Encounter: Payer: Self-pay | Admitting: Gastroenterology

## 2022-11-29 ENCOUNTER — Telehealth: Payer: Self-pay | Admitting: Family Medicine

## 2022-11-29 ENCOUNTER — Other Ambulatory Visit: Payer: Self-pay | Admitting: Family Medicine

## 2022-11-29 VITALS — BP 130/80 | HR 94 | Ht 59.0 in | Wt 169.0 lb

## 2022-11-29 DIAGNOSIS — Z1211 Encounter for screening for malignant neoplasm of colon: Secondary | ICD-10-CM

## 2022-11-29 MED ORDER — NA SULFATE-K SULFATE-MG SULF 17.5-3.13-1.6 GM/177ML PO SOLN
1.0000 | Freq: Once | ORAL | 0 refills | Status: AC
Start: 2022-11-29 — End: 2022-11-29

## 2022-11-29 MED ORDER — METHYLPREDNISOLONE 4 MG PO TABS: ORAL_TABLET | ORAL | 0 refills | Status: DC

## 2022-11-29 NOTE — Patient Instructions (Signed)
You have been scheduled for a colonoscopy. Please follow written instructions given to you at your visit today.   Please pick up your prep supplies at the pharmacy within the next 1-3 days.  If you use inhalers (even only as needed), please bring them with you on the day of your procedure.  DO NOT TAKE 7 DAYS PRIOR TO TEST- Trulicity (dulaglutide) Ozempic, Wegovy (semaglutide) Mounjaro (tirzepatide) Bydureon Bcise (exanatide extended release)  DO NOT TAKE 1 DAY PRIOR TO YOUR TEST Rybelsus (semaglutide) Adlyxin (lixisenatide) Victoza (liraglutide) Byetta (exanatide) ___________________________________________________________________________   _______________________________________________________  If your blood pressure at your visit was 140/90 or greater, please contact your primary care physician to follow up on this.  _______________________________________________________  If you are age 3 or older, your body mass index should be between 23-30. Your Body mass index is 34.13 kg/m. If this is out of the aforementioned range listed, please consider follow up with your Primary Care Provider.  If you are age 31 or younger, your body mass index should be between 19-25. Your Body mass index is 34.13 kg/m. If this is out of the aformentioned range listed, please consider follow up with your Primary Care Provider.   ________________________________________________________  The [facility] providers would like to encourage you to use [facility] to communicate with providers for non-urgent requests or questions.  Due to long hold times on the telephone, sending your provider a message by [facility] may be a faster and more efficient way to get a response.  Please allow 48 business hours for a response.  Please remember that this is for non-urgent requests.  _______________________________________________________  It was a pleasure to see you today!  Thank you for trusting me with your  gastrointestinal care!

## 2022-11-29 NOTE — Progress Notes (Signed)
[facility] Consult Note:  History: Lauren Hall 11/29/2022  Referring provider: Bradd Canary, MD  Reason for consult/chief complaint: Colonoscopy (Pt states she would like to discuss a colon.)   Subjective  HPI: This is a 52 year old woman I saw in December 2017 for chronic dyspepsia and family history of colon cancer.  She was scheduled for an EGD and colonoscopy 03/10/2016 but was a no-show without cancellation call. She is referred back to see [place] for family history of colon cancer and consideration of colonoscopy.  She was scheduled to have this with me 3 months ago but our anesthesia consultant noticed a reported history of Chiari malformation and felt this would confer risk of difficult intubation in emergency situations, and their assessment was this patient should have a procedure in the hospital endoscopy lab.  I contacted primary care about that history, but they did not have any further information about it as the diagnosis had not been made by them or a provider in this health system.  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  I reviewed that provider's note and while there has been a recent MRI of cervical spine.   Most recent primary care note from 11/04/2022 was reviewed and outlines multiple medical issues including recently diagnosed diabetes that is now treated with GLP-1 agonist. ____________________  Today, she reports feeling well overall. She is here today to discuss undergoing a recall colonoscopy. She does report having a history of Chiari malformation, found on MRI ordered by neurosurgeon in [place], [place] many years ago (prior to the 2017 date when it was put in her [facility] epic medical history).  She does report experiencing nausea but attributes that to nasal congestion. She typically takes Zofran when needed to help relief her symptoms. She denies any vomiting. She also complains  of occasional reflux/regurgitation but they don't tend to be severe and are well managed without medication. She is drinking mushroom tea which seems to help regulate her BMs.Lauren Hall also reports significant stress in recent years with her husband on dialysis and loss of both her parents as well as dealing with her own medical issues.  She also reports being diagnosed with diabetes since her last office visit, She will be starting Trulicity later today.  She reports a family history of colon cancer in maternal grandmother and in father.    ROS:  Review of Systems  Constitutional:  Negative for appetite change and fever.  HENT:  Negative for trouble swallowing.   Respiratory:  Negative for cough and shortness of breath.   Cardiovascular:  Negative for chest pain.  Gastrointestinal:  Positive for nausea. Negative for abdominal distention, abdominal pain, anal bleeding, blood in stool, constipation, diarrhea, rectal pain and vomiting.       +reflux  Genitourinary:  Negative for dysuria.  Musculoskeletal:  Negative for back pain.  Skin:  Negative for rash.  Neurological:  Negative for weakness.  All other systems reviewed and are negative.    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   Complication of anesthesia    patient woke up while they were sewing up her incision with breast lumpectomy   Depression    Diabetes (HCC)    Dysrhythmia    Tachycardia   Enlarged thyroid    Fatigue  10/08/2010   Fibromyalgia 2009   GERD (gastroesophageal reflux disease) 09/26/2013   Headache(784.0) 10/08/2010   Migraines   Hip pain, bilateral 11/06/2010   Hyperglycemia 08/11/2015   normal Hgb A 1C per patient   Hypertension    Hypothyroidism 10/08/2010   Low back pain radiating to right leg 03/11/2015   Other and unspecified hyperlipidemia  07/29/2012   Peripheral neuropathy 11/11/2012   burning sensation mainly right leg   Poor concentration 01/11/2011   Preventative health care 07/29/2012   S/P laparoscopic hysterectomy 10/07/2015   Sinusitis, acute 07/27/2011   Tachycardia    Thyroid disease 10/08/2010   Tick bite of flank 11/09/2011   Tinea corporis 11/11/2012   Uterine leiomyoma      Past Surgical History: Past Surgical History:  Procedure Laterality Date   BREAST LUMPECTOMY Right 1999   benign    CYSTOSCOPY N/A 10/07/2015   Procedure: CYSTOSCOPY;  Surgeon: Lauren Hart, MD;  Location: [facility];  Service: Gynecology;  Laterality: N/A;     Family History: Family History  Problem Relation Age of Onset   Fibromyalgia Mother    Diabetes Mother        type 2   Hypertension Mother    Hyperlipidemia Mother    Arthritis Mother    Alcohol abuse Father    Colon cancer Father    Hypertension Brother    Alcohol abuse Brother    Heart disease Maternal Grandmother    Hyperlipidemia Maternal Grandmother    Diabetes Maternal Grandmother        type 2   Colon cancer Maternal Grandmother    Stroke Maternal Grandfather    Diabetes Maternal Grandfather        type 2   Ovarian cancer Paternal Grandmother    Alzheimer's disease Paternal Grandfather     Social History: Social History   Socioeconomic History   Marital status: Married    Spouse name: Lauren Hall   Number of children: Not on file   Years of education: Not on file   Highest education level: Not on file  Occupational History   Not on file  Tobacco Use   Smoking status: Never   Smokeless tobacco: Never  Substance and Sexual Activity   Alcohol use: Yes    Comment: special occassion- very rarely   Drug use: No   Sexual activity: Yes    Partners: Male    Birth control/protection: None  Other Topics Concern   Not on file  Social History Narrative   Not on file   Social Determinants of Health   Financial Resource Strain: Not on file  Food  Insecurity: Not on file  Transportation Needs: Not on file  Physical Activity: Not on file  Stress: Not on file  Social Connections: Not on file    Allergies: Allergies  Allergen Reactions   Promethazine Anaphylaxis   Promethazine Hcl Anaphylaxis    Outpatient Meds: Current Outpatient Medications  Medication Sig Dispense Refill   ALPRAZolam (XANAX) 1 MG tablet Take one tablet twice daily. 60 tablet 2   amLODipine (NORVASC) 5 MG tablet Take 1 tablet (5 mg total) by mouth daily. 90 tablet 3   atorvastatin (LIPITOR) 10 MG tablet Take 0.5 tablets (5 mg total) by mouth daily. 15 tablet 5   Blood Glucose Monitoring Suppl DEVI 1 each by Does not apply route in the morning, at noon, and at bedtime. May substitute to any manufacturer covered by patient's insurance. 1 each 0   busPIRone (BUSPAR) 5 MG  tablet TAKE 1 TABLET(5 MG) BY MOUTH THREE TIMES DAILY 270 tablet 3   cyclobenzaprine (FLEXERIL) 10 MG tablet Take 0.5-1 tablets (5-10 mg total) by mouth 3 (three) times daily as needed for muscle spasms. 90 tablet 0   diazepam (VALIUM) 5 MG tablet Take one tablet by mouth with food one hour prior to procedure. May repeat 30 minutes prior if needed. 2 tablet 0   Dulaglutide (TRULICITY) 0.75 MG/0.5ML SOAJ Inject 0.75 mg into the skin once a week. 2 mL 1   gabapentin (NEURONTIN) 300 MG capsule TAKE 1 CAPSULE BY MOUTH TWICE A DAY AND TAKE 2 CAPSULES BY MOUTH AT BEDTIME 120 capsule 2   Glucose Blood (BLOOD GLUCOSE TEST STRIPS) STRP 1 each by In Vitro route in the morning, at noon, and at bedtime. May substitute to any manufacturer covered by patient's insurance. 100 each 2   Lancet Device MISC 1 each by Does not apply route in the morning, at noon, and at bedtime. May substitute to any manufacturer covered by patient's insurance. 1 each 0   Lancets Misc. MISC 1 each by Does not apply route in the morning, at noon, and at bedtime. May substitute to any manufacturer covered by patient's insurance. 100 each 0    levothyroxine (SYNTHROID) 25 MCG tablet TAKE 1 TABLET BY MOUTH EVERY DAY BEFORE BREAKFAST 30 tablet 2   lisinopril (ZESTRIL) 10 MG tablet TAKE 1 TABLET (10 MG TOTAL) BY MOUTH IN THE MORNING AND AT BEDTIME 60 tablet 2   LOW-DOSE ASPIRIN PO      metoprolol tartrate (LOPRESSOR) 100 MG tablet Take 100 mg 2 hours before Coronary CT 1 tablet 0   Na Sulfate-K Sulfate-Mg Sulf 17.5-3.13-1.6 GM/177ML SOLN Take 1 kit by mouth once for 1 dose. 354 mL 0   omeprazole (PRILOSEC) 40 MG capsule Take 1 capsule (40 mg total) by mouth daily. 90 capsule 0   ondansetron (ZOFRAN) 4 MG tablet Take 1 tablet (4 mg total) by mouth every 8 (eight) hours as needed for nausea or vomiting. 30 tablet 1   venlafaxine XR (EFFEXOR-XR) 150 MG 24 hr capsule Take one capsule every morning. 90 capsule 3   No current facility-administered medications for this visit.      ___________________________________________________________________ Objective   Exam:  BP 130/80   Pulse 94   Ht 4\' 11"  (1.499 m)   Wt 169 lb (76.7 kg)   LMP 09/02/2015 (Approximate) Comment: continous bleeding  BMI 34.13 kg/m  Wt Readings from Last 3 Encounters:  11/29/22 169 lb (76.7 kg)  11/04/22 172 lb 12.8 oz (78.4 kg)  10/18/22 172 lb (78 kg)   General: well-appearing   Eyes: sclera anicteric, no redness ENT: oral mucosa moist without lesions, no cervical or supraclavicular lymphadenopathy.  Mallampati 4 airway CV: RRR, no JVD, no peripheral edema Resp: clear to auscultation bilaterally, normal RR and effort noted GI: soft, no tenderness, with active bowel sounds. No guarding or palpable organomegaly noted. Skin; warm and dry, no rash or jaundice noted Neuro: awake, alert and oriented x 3. Normal gross motor function and fluent speech   Labs:   Radiologic Studies:  CLINICAL DATA:  Chronic neck pain radiating into the right arm with right hand numbness for the past 2 months. No injury or prior surgery.   EXAM: MRI CERVICAL SPINE  WITHOUT CONTRAST   TECHNIQUE: Multiplanar, multisequence MR imaging of the cervical spine was performed. No intravenous contrast was administered.   COMPARISON:  Cervical spine x-rays dated July 13, 2022.  FINDINGS: Alignment: Straightening of the normal cervical lordosis. No significant listhesis.   Vertebrae: No fracture, evidence of discitis, or bone lesion.   Cord: Normal signal and morphology.   Posterior Fossa, vertebral arteries, paraspinal tissues: Negative.   Disc levels:   C2-C3: Negative disc. Mild bilateral facet arthropathy. No stenosis.   C3-C4: Mild disc bulging and right uncovertebral hypertrophy. Moderate right neuroforaminal stenosis. No spinal canal or left neuroforaminal stenosis.   C4-C5: Small broad-based posterior disc osteophyte complex and bilateral uncovertebral hypertrophy. Mild to moderate right neuroforaminal stenosis. No spinal canal or left neuroforaminal stenosis.   C5-C6: Mild disc bulging with superimposed right foraminal disc protrusion. Right uncovertebral hypertrophy. Moderate right neuroforaminal stenosis. No spinal canal or left neuroforaminal stenosis.   C6-C7: Mild disc bulging and bilateral uncovertebral hypertrophy. No stenosis.   C7-T1: Negative disc. Mild bilateral facet arthropathy. No stenosis.   IMPRESSION: 1. Multilevel degenerative changes of the cervical spine as described above. Moderate right neuroforaminal stenosis from C3-C4 through C5-C6.     Electronically Signed   By: Obie Dredge M.D.   On: 11/02/2022 15:37    Assessment: Special screening for malignant neoplasms, colon - Plan: 0.9 %  sodium chloride infusion, Ambulatory referral to Gastroenterology  Mild intermittent reflux that she does not feel needs meds, no red flag signs.  I do not think an EGD is needed at this point.   Average risk colorectal cancer with a grandparent who had the diagnosis.  She is due for screening and we will get it done  in the hospital outpatient endoscopy lab since there is reportedly some increased possibility of difficult intubation with her known Chiari malformation.  She also has a Mallampati 4 airway on exam today. Plan: -colonoscopy  She was agreeable after discussion of procedure and risks.  The benefits and risks of the planned procedure were described in detail with the patient or (when appropriate) their health care proxy.  Risks were outlined as including, but not limited to, bleeding, infection, perforation, adverse medication reaction leading to cardiac or pulmonary decompensation, pancreatitis (if ERCP).  The limitation of incomplete mucosal visualization was also discussed.  No guarantees or warranties were given.   [name], MD    [facility]  Thank you for the courtesy of this consult.  Please call me with any questions or concerns.   I,Safa M Kadhim,acting as a scribe for Charlie Pitter III, MD.,have documented all relevant documentation on the behalf of Sherrilyn Rist, MD,as directed by  Sherrilyn Rist, MD while in the presence of Sherrilyn Rist, MD.   Marvis Repress III, MD, have reviewed all documentation for this visit. The documentation on 11/29/22 for the exam, diagnosis, procedures, and orders are all accurate and complete.    CC: Referring provider noted above

## 2022-11-29 NOTE — Telephone Encounter (Signed)
Pt states she is having a really bad micro fibromyalgia flare up and was wondering if she could have an rx for prednisone or any other recommendations. Stated she had a previous dr that prescribed this for her and it helped in the past. It is very difficult for pt to get in because she has so many other dr appts and relies on husband for transportation so she requested a message sent back. Pt stated her pain is at an 7-8 and she did cry last night. Please advise next steps.    [id number] [place], [place] - [address] AT [facility] [address] [place], [place] [address] Phone: [phone]  Fax: [phone]

## 2022-11-29 NOTE — Progress Notes (Deleted)
[facility] Consult Note:  History: [facility] 11/29/2022  Referring provider: Bradd Canary, MD  Reason for consult/chief complaint: No chief complaint on file.   Subjective  HPI: This is a 52 year old woman I saw in December 2017 for chronic dyspepsia and family history of colon cancer.  She was scheduled for an EGD and colonoscopy 03/10/2016 but was a no-show without cancellation call. She is referred back to see [place] for family history of colon cancer and consideration of colonoscopy.  She was scheduled to have this with me 3 months ago but our anesthesia consultant noticed a reported history of Chiari malformation and felt this would confer risk of difficult intubation in emergency situations, and their assessment was this patient should have a procedure in the hospital endoscopy lab.  I contacted primary care about that history, but they did not have any further information about it as the diagnosis had not been made by them or a provider in this health system.  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  I reviewed that provider's note and while there has been a recent MRI of cervical spine.   Most recent primary care note from 11/04/2022 was reviewed and outlines multiple medical issues including recently diagnosed diabetes that is now treated with GLP-1 agonist. 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   Complication of anesthesia    patient woke up while they were sewing up her incision with breast lumpectomy   Depression    Dysrhythmia    Tachycardia   Enlarged thyroid    Fatigue 10/08/2010   Fibromyalgia 2009   GERD (gastroesophageal reflux disease) 09/26/2013   Headache(784.0) 10/08/2010   Migraines    Hip pain, bilateral 11/06/2010   Hyperglycemia 08/11/2015   normal Hgb A 1C per patient   Hypertension    Hypothyroidism 10/08/2010   Low back pain radiating to right leg 03/11/2015   Other and unspecified hyperlipidemia 07/29/2012   Peripheral neuropathy 11/11/2012   burning sensation mainly right leg   Poor concentration 01/11/2011   Preventative health care 07/29/2012   S/P laparoscopic hysterectomy 10/07/2015   Sinusitis, acute 07/27/2011   Tachycardia    Thyroid disease 10/08/2010   Tick bite of flank 11/09/2011   Tinea corporis 11/11/2012   Uterine leiomyoma      Past Surgical History: Past Surgical History:  Procedure Laterality Date   BREAST LUMPECTOMY Right 1999   benign    CYSTOSCOPY N/A 10/07/2015   Procedure: CYSTOSCOPY;  Surgeon: Essie Hart, MD;  Location: [facility];  Service: Gynecology;  Laterality: N/A;     Family History: Family History  Problem Relation Age of Onset   Fibromyalgia Mother    Diabetes Mother        type 2   Hypertension Mother    Hyperlipidemia Mother    Arthritis Mother    Alcohol abuse Father    Colon cancer Father    Hypertension Brother    Alcohol abuse Brother    Heart disease Maternal Grandmother    Hyperlipidemia Maternal Grandmother    Diabetes Maternal Grandmother        type 2   Colon cancer Maternal Grandmother    Stroke Maternal Grandfather    Diabetes  Maternal Grandfather        type 2   Ovarian cancer Paternal Grandmother    Alzheimer's disease Paternal Grandfather     Social History: Social History   Socioeconomic History   Marital status: Married    Spouse name: Derrick   Number of children: Not on file   Years of education: Not on file   Highest education level: Not on file  Occupational History   Not on file  Tobacco Use   Smoking status: Never   Smokeless tobacco: Never  Substance and Sexual Activity   Alcohol use: Yes    Comment: special occassion- very rarely   Drug use: No   Sexual activity: Yes     Partners: Male    Birth control/protection: None  Other Topics Concern   Not on file  Social History Narrative   Not on file   Social Determinants of Health   Financial Resource Strain: Not on file  Food Insecurity: Not on file  Transportation Needs: Not on file  Physical Activity: Not on file  Stress: Not on file  Social Connections: Not on file    Allergies: Allergies  Allergen Reactions   Promethazine Anaphylaxis   Promethazine Hcl Anaphylaxis    Outpatient Meds: Current Outpatient Medications  Medication Sig Dispense Refill   ALPRAZolam (XANAX) 1 MG tablet Take one tablet twice daily. 60 tablet 2   amLODipine (NORVASC) 5 MG tablet Take 1 tablet (5 mg total) by mouth daily. 90 tablet 3   atorvastatin (LIPITOR) 10 MG tablet Take 0.5 tablets (5 mg total) by mouth daily. 15 tablet 5   Blood Glucose Monitoring Suppl DEVI 1 each by Does not apply route in the morning, at noon, and at bedtime. May substitute to any manufacturer covered by patient's insurance. 1 each 0   busPIRone (BUSPAR) 5 MG tablet TAKE 1 TABLET(5 MG) BY MOUTH THREE TIMES DAILY 270 tablet 3   cyclobenzaprine (FLEXERIL) 10 MG tablet Take 0.5-1 tablets (5-10 mg total) by mouth 3 (three) times daily as needed for muscle spasms. 90 tablet 0   diazepam (VALIUM) 5 MG tablet Take one tablet by mouth with food one hour prior to procedure. May repeat 30 minutes prior if needed. 2 tablet 0   Dulaglutide (TRULICITY) 0.75 MG/0.5ML SOAJ Inject 0.75 mg into the skin once a week. 2 mL 1   gabapentin (NEURONTIN) 300 MG capsule TAKE 1 CAPSULE BY MOUTH TWICE A DAY AND TAKE 2 CAPSULES BY MOUTH AT BEDTIME 120 capsule 2   Glucose Blood (BLOOD GLUCOSE TEST STRIPS) STRP 1 each by In Vitro route in the morning, at noon, and at bedtime. May substitute to any manufacturer covered by patient's insurance. 100 each 2   Lancet Device MISC 1 each by Does not apply route in the morning, at noon, and at bedtime. May substitute to any manufacturer  covered by patient's insurance. 1 each 0   Lancets Misc. MISC 1 each by Does not apply route in the morning, at noon, and at bedtime. May substitute to any manufacturer covered by patient's insurance. 100 each 0   levothyroxine (SYNTHROID) 25 MCG tablet TAKE 1 TABLET BY MOUTH EVERY DAY BEFORE BREAKFAST 30 tablet 2   lisinopril (ZESTRIL) 10 MG tablet TAKE 1 TABLET (10 MG TOTAL) BY MOUTH IN THE MORNING AND AT BEDTIME 60 tablet 2   LOW-DOSE ASPIRIN PO      metoprolol tartrate (LOPRESSOR) 100 MG tablet Take 100 mg 2 hours before Coronary CT 1 tablet 0  omeprazole (PRILOSEC) 40 MG capsule Take 1 capsule (40 mg total) by mouth daily. 90 capsule 0   ondansetron (ZOFRAN) 4 MG tablet Take 1 tablet (4 mg total) by mouth every 8 (eight) hours as needed for nausea or vomiting. 30 tablet 1   venlafaxine XR (EFFEXOR-XR) 150 MG 24 hr capsule Take one capsule every morning. 90 capsule 3   No current facility-administered medications for this visit.      ___________________________________________________________________ Objective   Exam:  LMP 09/02/2015 (Approximate) Comment: continous bleeding Wt Readings from Last 3 Encounters:  11/04/22 172 lb 12.8 oz (78.4 kg)  10/18/22 172 lb (78 kg)  07/29/22 169 lb 12.8 oz (77 kg)    General: ***  Eyes: sclera anicteric, no redness ENT: oral mucosa moist without lesions, no cervical or supraclavicular lymphadenopathy CV: ***, no JVD, no peripheral edema Resp: clear to auscultation bilaterally, normal RR and effort noted GI: soft, *** tenderness, with active bowel sounds. No guarding or palpable organomegaly noted. Skin; warm and dry, no rash or jaundice noted Neuro: awake, alert and oriented x 3. Normal gross motor function and fluent speech  Labs:  ***  Radiologic Studies:  CLINICAL DATA:  Chronic neck pain radiating into the right arm with right hand numbness for the past 2 months. No injury or prior surgery.   EXAM: MRI CERVICAL SPINE  WITHOUT CONTRAST   TECHNIQUE: Multiplanar, multisequence MR imaging of the cervical spine was performed. No intravenous contrast was administered.   COMPARISON:  Cervical spine x-rays dated July 13, 2022.   FINDINGS: Alignment: Straightening of the normal cervical lordosis. No significant listhesis.   Vertebrae: No fracture, evidence of discitis, or bone lesion.   Cord: Normal signal and morphology.   Posterior Fossa, vertebral arteries, paraspinal tissues: Negative.   Disc levels:   C2-C3: Negative disc. Mild bilateral facet arthropathy. No stenosis.   C3-C4: Mild disc bulging and right uncovertebral hypertrophy. Moderate right neuroforaminal stenosis. No spinal canal or left neuroforaminal stenosis.   C4-C5: Small broad-based posterior disc osteophyte complex and bilateral uncovertebral hypertrophy. Mild to moderate right neuroforaminal stenosis. No spinal canal or left neuroforaminal stenosis.   C5-C6: Mild disc bulging with superimposed right foraminal disc protrusion. Right uncovertebral hypertrophy. Moderate right neuroforaminal stenosis. No spinal canal or left neuroforaminal stenosis.   C6-C7: Mild disc bulging and bilateral uncovertebral hypertrophy. No stenosis.   C7-T1: Negative disc. Mild bilateral facet arthropathy. No stenosis.   IMPRESSION: 1. Multilevel degenerative changes of the cervical spine as described above. Moderate right neuroforaminal stenosis from C3-C4 through C5-C6.     Electronically Signed   By: Obie Dredge M.D.   On: 11/02/2022 15:37    Assessment: No diagnosis found.  ***  Plan:  ***  Thank you for the courtesy of this consult.  Please call me with any questions or concerns.  Charlie Pitter III  CC: Referring provider noted above

## 2022-11-29 NOTE — Telephone Encounter (Signed)
Called to check to see if patient called the other providers office but had to leave a message.  Will we be able to help her with this?

## 2022-11-30 ENCOUNTER — Ambulatory Visit: Payer: 59 | Admitting: Family Medicine

## 2022-11-30 ENCOUNTER — Encounter: Payer: Self-pay | Admitting: Family Medicine

## 2022-11-30 ENCOUNTER — Ambulatory Visit (INDEPENDENT_AMBULATORY_CARE_PROVIDER_SITE_OTHER): Payer: 59

## 2022-11-30 VITALS — BP 116/70 | HR 103 | Ht 59.0 in | Wt 170.0 lb

## 2022-11-30 DIAGNOSIS — G8929 Other chronic pain: Secondary | ICD-10-CM | POA: Diagnosis not present

## 2022-11-30 DIAGNOSIS — M25552 Pain in left hip: Secondary | ICD-10-CM | POA: Diagnosis not present

## 2022-11-30 DIAGNOSIS — M545 Low back pain, unspecified: Secondary | ICD-10-CM

## 2022-11-30 DIAGNOSIS — M9902 Segmental and somatic dysfunction of thoracic region: Secondary | ICD-10-CM

## 2022-11-30 DIAGNOSIS — M9903 Segmental and somatic dysfunction of lumbar region: Secondary | ICD-10-CM | POA: Diagnosis not present

## 2022-11-30 DIAGNOSIS — M48061 Spinal stenosis, lumbar region without neurogenic claudication: Secondary | ICD-10-CM | POA: Diagnosis not present

## 2022-11-30 DIAGNOSIS — M9904 Segmental and somatic dysfunction of sacral region: Secondary | ICD-10-CM | POA: Diagnosis not present

## 2022-11-30 DIAGNOSIS — M79604 Pain in right leg: Secondary | ICD-10-CM | POA: Diagnosis not present

## 2022-11-30 DIAGNOSIS — M5136 Other intervertebral disc degeneration, lumbar region with discogenic back pain only: Secondary | ICD-10-CM | POA: Diagnosis not present

## 2022-11-30 DIAGNOSIS — M160 Bilateral primary osteoarthritis of hip: Secondary | ICD-10-CM | POA: Diagnosis not present

## 2022-11-30 DIAGNOSIS — M25551 Pain in right hip: Secondary | ICD-10-CM | POA: Diagnosis not present

## 2022-11-30 NOTE — Assessment & Plan Note (Signed)
Seems to be more significant on the medial right.  Sacroiliac joint tightness noted on the low end of this area.  Manually consider further evaluation with x-rays today.  May need to consider laboratory workup as well the patient's fibromyalgia but has seen multiple providers previously.  Given home exercises today as well.  Responded relatively well to osteopathic manipulation.  Follow-up again in 6 to 8 weeks

## 2022-11-30 NOTE — Assessment & Plan Note (Signed)

## 2022-11-30 NOTE — Telephone Encounter (Signed)
Patient notified and will call for evaluation if needed. She stated she went to sports med for injection in back and hopefully this will all work.  She did let them know that she got the medrol dose pack.

## 2022-11-30 NOTE — Patient Instructions (Signed)
Good to see you  Injection given today  X rays today  SI joint Exercises given Follow up in 6 weeks

## 2022-12-01 ENCOUNTER — Ambulatory Visit: Payer: 59

## 2022-12-01 NOTE — Progress Notes (Deleted)
Office Visit    Patient Name: Lauren Hall Date of Encounter: 12/01/2022  Primary Care Provider:  Bradd Canary, MD Primary Cardiologist:  Epifanio Lesches MD  Chief Complaint    Hypertension  Significant Past Medical History   Debroah Loop Chiari syndrome   HLD 10/24 LDL 71 on atorvastatin 5 mg  DM2 10/24 new diagnosis, A1c 6.6, on Trulicity  hypothyroid On levothyroxine 25 mcg, TSH WNL       Allergies  Allergen Reactions   Promethazine Anaphylaxis   Promethazine Hcl Anaphylaxis    History of Present Illness    Lauren Hall is a 52 y.o. female patient of Dr Bjorn Pippin, in the office today for hypertension follow up.  She was most recently seen by Carmela Hurt earlier in the month, and BP was controlled at 123/79.    Blood Pressure Goal:  130/80  Current HTN meds: lisinopril 10 mg twice daily, amlodipine 5 mg daily  Previously tried: none  BP goal: <130/80   Family History:  Relation Problem Comments  Mother - 18 (Deceased) Arthritis    Diabetes type 2  Fibromyalgia    Hyperlipidemia    Hypertension      Father - 7 (Deceased) Alcohol abuse    Colon cancer      Sister - P 1/2 (Alive)    Brother - P 1/2 Metallurgist) Alcohol abuse    Hypertension      Maternal Grandmother (Deceased at age 36) Colon cancer    Diabetes type 2  Heart disease    Hyperlipidemia      Maternal Grandfather (Deceased) Diabetes type 2  Stroke      Paternal Grandmother (Deceased at age younger)       Diet: does not add salt to her food, reads food label for sodium contents.  Does not drink caffeinated beverages   Exercise:  Due to hip pain, back pain and fibromyalgia unable to perform any exercise longer than 10 min     Home BP readings:      Adherence Assessment  Do you ever forget to take your medication? [] Yes [] No  Do you ever skip doses due to side effects? [] Yes [] No  Do you have trouble affording your medicines? [] Yes [] No  Are you ever unable to pick up  your medication due to transportation difficulties? [] Yes [] No  Do you ever stop taking your medications because you don't believe they are helping? [] Yes [] No  Do you check your weight daily? [] Yes [] No   Adherence strategy: ***  Barriers to obtaining medications: ***     Accessory Clinical Findings    Lab Results  Component Value Date   CREATININE 0.71 11/02/2022   BUN 5 (L) 11/02/2022   NA 139 11/02/2022   K 3.4 (L) 11/02/2022   CL 101 11/02/2022   CO2 29 11/02/2022   Lab Results  Component Value Date   ALT 37 (H) 11/02/2022   AST 24 11/02/2022   ALKPHOS 141 (H) 11/02/2022   BILITOT 0.4 11/02/2022   Lab Results  Component Value Date   HGBA1C 6.6 (H) 11/02/2022    Home Medications    Current Outpatient Medications  Medication Sig Dispense Refill   ALPRAZolam (XANAX) 1 MG tablet Take one tablet twice daily. 60 tablet 2   amLODipine (NORVASC) 5 MG tablet Take 1 tablet (5 mg total) by mouth daily. 90 tablet 3   atorvastatin (LIPITOR) 10 MG tablet Take 0.5 tablets (5 mg total) by mouth daily. 15 tablet 5  Blood Glucose Monitoring Suppl DEVI 1 each by Does not apply route in the morning, at noon, and at bedtime. May substitute to any manufacturer covered by patient's insurance. 1 each 0   busPIRone (BUSPAR) 5 MG tablet TAKE 1 TABLET(5 MG) BY MOUTH THREE TIMES DAILY 270 tablet 3   cyclobenzaprine (FLEXERIL) 10 MG tablet Take 0.5-1 tablets (5-10 mg total) by mouth 3 (three) times daily as needed for muscle spasms. 90 tablet 0   diazepam (VALIUM) 5 MG tablet Take one tablet by mouth with food one hour prior to procedure. May repeat 30 minutes prior if needed. 2 tablet 0   Dulaglutide (TRULICITY) 0.75 MG/0.5ML SOAJ Inject 0.75 mg into the skin once a week. 2 mL 1   gabapentin (NEURONTIN) 300 MG capsule TAKE 1 CAPSULE BY MOUTH TWICE A DAY AND TAKE 2 CAPSULES BY MOUTH AT BEDTIME 120 capsule 2   Glucose Blood (BLOOD GLUCOSE TEST STRIPS) STRP 1 each by In Vitro route in the  morning, at noon, and at bedtime. May substitute to any manufacturer covered by patient's insurance. 100 each 2   Lancet Device MISC 1 each by Does not apply route in the morning, at noon, and at bedtime. May substitute to any manufacturer covered by patient's insurance. 1 each 0   Lancets Misc. MISC 1 each by Does not apply route in the morning, at noon, and at bedtime. May substitute to any manufacturer covered by patient's insurance. 100 each 0   levothyroxine (SYNTHROID) 25 MCG tablet TAKE 1 TABLET BY MOUTH EVERY DAY BEFORE BREAKFAST 30 tablet 2   lisinopril (ZESTRIL) 10 MG tablet TAKE 1 TABLET (10 MG TOTAL) BY MOUTH IN THE MORNING AND AT BEDTIME 60 tablet 2   LOW-DOSE ASPIRIN PO      methylPREDNISolone (MEDROL) 4 MG tablet 6 tabs po x 1 day then 5 tabs po x 1 day then 4 tabs po x 1 day then 3 tabs po x 1 day then 2 tabs po x 1 day then 1 tab po x 1 day and stop 21 tablet 0   metoprolol tartrate (LOPRESSOR) 100 MG tablet Take 100 mg 2 hours before Coronary CT 1 tablet 0   omeprazole (PRILOSEC) 40 MG capsule Take 1 capsule (40 mg total) by mouth daily. 90 capsule 0   ondansetron (ZOFRAN) 4 MG tablet Take 1 tablet (4 mg total) by mouth every 8 (eight) hours as needed for nausea or vomiting. 30 tablet 1   venlafaxine XR (EFFEXOR-XR) 150 MG 24 hr capsule Take one capsule every morning. 90 capsule 3   No current facility-administered medications for this visit.         Assessment & Plan    No problem-specific Assessment & Plan notes found for this encounter.   Phillips Hay PharmD CPP [facility]  [address] [place], [place] [address] [phone]

## 2022-12-02 ENCOUNTER — Other Ambulatory Visit: Payer: Self-pay

## 2022-12-02 ENCOUNTER — Telehealth: Payer: Self-pay | Admitting: Physical Medicine and Rehabilitation

## 2022-12-02 ENCOUNTER — Ambulatory Visit: Payer: 59 | Admitting: Physical Medicine and Rehabilitation

## 2022-12-02 DIAGNOSIS — M5412 Radiculopathy, cervical region: Secondary | ICD-10-CM

## 2022-12-02 MED ORDER — METHYLPREDNISOLONE ACETATE 40 MG/ML IJ SUSP
40.0000 mg | Freq: Once | INTRAMUSCULAR | Status: AC
Start: 2022-12-02 — End: 2022-12-02
  Administered 2022-12-02: 40 mg

## 2022-12-02 NOTE — Telephone Encounter (Signed)
Spoke with patient and informed her to take the Valium 1 hour before before injection

## 2022-12-02 NOTE — Progress Notes (Signed)
Functional Pain Scale - descriptive words and definitions  Moderate (4)   Constantly aware of pain, can complete ADLs with modification/sleep marginally affected at times/passive distraction is of no use, but active distraction gives some relief. Moderate range order  Average Pain 4   +Driver, -BT, -Dye Allergies.

## 2022-12-02 NOTE — Telephone Encounter (Signed)
Patient called and wanted to know if she was supposed to take the medication before the appointment? [id number]#[id number]

## 2022-12-03 ENCOUNTER — Encounter: Payer: Self-pay | Admitting: Physical Medicine and Rehabilitation

## 2022-12-03 NOTE — Progress Notes (Signed)
Lauren Hall - 52 y.o. female MRN [id number]  Date of birth: 17-Feb-1970  Office Visit Note: Visit Date: 12/02/2022 PCP: Bradd Canary, MD Referred by: Bradd Canary, MD  Subjective: Chief Complaint  Patient presents with   Neck - Pain   HPI:  Lauren Hall is a 52 y.o. female who comes in today at the request of Mana Haberl, FNP for planned Right C7-T1 Cervical Interlaminar epidural steroid injection with fluoroscopic guidance.  The patient has failed conservative care including home exercise, medications, time and activity modification.  This injection will be diagnostic and hopefully therapeutic.  Please see requesting physician notes for further details and justification.   ROS Otherwise per HPI.  Assessment & Plan: Visit Diagnoses:    ICD-10-CM   1. Cervical radiculopathy  M54.12 XR C-ARM NO REPORT    Epidural Steroid injection    methylPREDNISolone acetate (DEPO-MEDROL) injection 40 mg      Plan: No additional findings.   Meds & Orders:  Meds ordered this encounter  Medications   methylPREDNISolone acetate (DEPO-MEDROL) injection 40 mg    Orders Placed This Encounter  Procedures   XR C-ARM NO REPORT   Epidural Steroid injection    Follow-up: Return for visit to requesting provider as needed.   Procedures: No procedures performed  Cervical Epidural Steroid Injection - Interlaminar Approach with Fluoroscopic Guidance  Patient: Lauren Hall      Date of Birth: 04-15-1970 MRN: [id number] PCP: Bradd Canary, MD      Visit Date: 12/02/2022   Universal Protocol:    Date/Time: 11/01/248:09 AM  Consent Given By: the patient  Position: PRONE  Additional Comments: Vital signs were monitored before and after the procedure. Patient was prepped and draped in the usual sterile fashion. The correct patient, procedure, and site was verified.   Injection Procedure Details:   Procedure diagnoses: Cervical radiculopathy [M54.12]    Meds  Administered:  Meds ordered this encounter  Medications   methylPREDNISolone acetate (DEPO-MEDROL) injection 40 mg     Laterality: Right  Location/Site: C7-T1  Needle: 3.5 in., 20 ga. Tuohy  Needle Placement: Paramedian epidural space  Findings:  -Comments: Excellent flow of contrast into the epidural space.  Procedure Details: Using a paramedian approach from the side mentioned above, the region overlying the inferior lamina was localized under fluoroscopic visualization and the soft tissues overlying this structure were infiltrated with 4 ml. of 1% Lidocaine without Epinephrine. A # 20 gauge, Tuohy needle was inserted into the epidural space using a paramedian approach.  The epidural space was localized using loss of resistance along with contralateral oblique bi-planar fluoroscopic views.  After negative aspirate for air, blood, and CSF, a 2 ml. volume of Isovue-250 was injected into the epidural space and the flow of contrast was observed. Radiographs were obtained for documentation purposes.   The injectate was administered into the level noted above.  Additional Comments:  No complications occurred Dressing: 2 x 2 sterile gauze and Band-Aid    Post-procedure details: Patient was observed during the procedure. Post-procedure instructions were reviewed.  Patient left the clinic in stable condition.   Clinical History: CLINICAL DATA:  Chronic neck pain radiating into the right arm with right hand numbness for the past 2 months. No injury or prior surgery.   EXAM: MRI CERVICAL SPINE WITHOUT CONTRAST   TECHNIQUE: Multiplanar, multisequence MR imaging of the cervical spine was performed. No intravenous contrast was administered.   COMPARISON:  Cervical spine x-rays dated  July 13, 2022.   FINDINGS: Alignment: Straightening of the normal cervical lordosis. No significant listhesis.   Vertebrae: No fracture, evidence of discitis, or bone lesion.   Cord: Normal  signal and morphology.   Posterior Fossa, vertebral arteries, paraspinal tissues: Negative.   Disc levels:   C2-C3: Negative disc. Mild bilateral facet arthropathy. No stenosis.   C3-C4: Mild disc bulging and right uncovertebral hypertrophy. Moderate right neuroforaminal stenosis. No spinal canal or left neuroforaminal stenosis.   C4-C5: Small broad-based posterior disc osteophyte complex and bilateral uncovertebral hypertrophy. Mild to moderate right neuroforaminal stenosis. No spinal canal or left neuroforaminal stenosis.   C5-C6: Mild disc bulging with superimposed right foraminal disc protrusion. Right uncovertebral hypertrophy. Moderate right neuroforaminal stenosis. No spinal canal or left neuroforaminal stenosis.   C6-C7: Mild disc bulging and bilateral uncovertebral hypertrophy. No stenosis.   C7-T1: Negative disc. Mild bilateral facet arthropathy. No stenosis.   IMPRESSION: 1. Multilevel degenerative changes of the cervical spine as described above. Moderate right neuroforaminal stenosis from C3-C4 through C5-C6.     Electronically Signed   By: Obie Dredge M.D.   On: 11/02/2022 15:37     Objective:  VS:  HT:    WT:   BMI:     BP:   HR: bpm  TEMP: ( )  RESP:  Physical Exam Vitals and nursing note reviewed.  Constitutional:      General: She is not in acute distress.    Appearance: Normal appearance. She is not ill-appearing.  HENT:     Head: Normocephalic and atraumatic.     Right Ear: External ear normal.     Left Ear: External ear normal.  Eyes:     Extraocular Movements: Extraocular movements intact.  Cardiovascular:     Rate and Rhythm: Normal rate.     Pulses: Normal pulses.  Musculoskeletal:     Cervical back: Tenderness present. No rigidity.     Right lower leg: No edema.     Left lower leg: No edema.     Comments: Patient has good strength in the upper extremities including 5 out of 5 strength in wrist extension long finger flexion  and APB.  There is no atrophy of the hands intrinsically.  There is a negative Hoffmann's test.   Lymphadenopathy:     Cervical: No cervical adenopathy.  Skin:    Findings: No erythema, lesion or rash.  Neurological:     General: No focal deficit present.     Mental Status: She is alert and oriented to person, place, and time.     Sensory: No sensory deficit.     Motor: No weakness or abnormal muscle tone.     Coordination: Coordination normal.  Psychiatric:        Mood and Affect: Mood normal.        Behavior: Behavior normal.      Imaging: No results found.

## 2022-12-03 NOTE — Procedures (Signed)
Cervical Epidural Steroid Injection - Interlaminar Approach with Fluoroscopic Guidance  Patient: Lauren Hall      Date of Birth: 1970-12-14 MRN: [id number] PCP: Bradd Canary, MD      Visit Date: 12/02/2022   Universal Protocol:    Date/Time: 11/01/248:09 AM  Consent Given By: the patient  Position: PRONE  Additional Comments: Vital signs were monitored before and after the procedure. Patient was prepped and draped in the usual sterile fashion. The correct patient, procedure, and site was verified.   Injection Procedure Details:   Procedure diagnoses: Cervical radiculopathy [M54.12]    Meds Administered:  Meds ordered this encounter  Medications   methylPREDNISolone acetate (DEPO-MEDROL) injection 40 mg     Laterality: Right  Location/Site: C7-T1  Needle: 3.5 in., 20 ga. Tuohy  Needle Placement: Paramedian epidural space  Findings:  -Comments: Excellent flow of contrast into the epidural space.  Procedure Details: Using a paramedian approach from the side mentioned above, the region overlying the inferior lamina was localized under fluoroscopic visualization and the soft tissues overlying this structure were infiltrated with 4 ml. of 1% Lidocaine without Epinephrine. A # 20 gauge, Tuohy needle was inserted into the epidural space using a paramedian approach.  The epidural space was localized using loss of resistance along with contralateral oblique bi-planar fluoroscopic views.  After negative aspirate for air, blood, and CSF, a 2 ml. volume of Isovue-250 was injected into the epidural space and the flow of contrast was observed. Radiographs were obtained for documentation purposes.   The injectate was administered into the level noted above.  Additional Comments:  No complications occurred Dressing: 2 x 2 sterile gauze and Band-Aid    Post-procedure details: Patient was observed during the procedure. Post-procedure instructions were reviewed.  Patient  left the clinic in stable condition.

## 2022-12-04 ENCOUNTER — Other Ambulatory Visit: Payer: Self-pay | Admitting: Family Medicine

## 2022-12-07 DIAGNOSIS — F332 Major depressive disorder, recurrent severe without psychotic features: Secondary | ICD-10-CM | POA: Diagnosis not present

## 2022-12-10 ENCOUNTER — Encounter: Payer: 59 | Admitting: Obstetrics and Gynecology

## 2022-12-13 ENCOUNTER — Encounter: Payer: Self-pay | Admitting: Family Medicine

## 2022-12-13 ENCOUNTER — Encounter: Payer: Self-pay | Admitting: Cardiology

## 2022-12-13 ENCOUNTER — Other Ambulatory Visit: Payer: Self-pay | Admitting: Medical

## 2022-12-14 MED ORDER — ONETOUCH DELICA PLUS LANCET30G MISC: 12 refills | Status: AC

## 2022-12-14 MED ORDER — BLOOD GLUCOSE TEST VI STRP: ORAL_STRIP | 12 refills | Status: AC

## 2022-12-15 ENCOUNTER — Telehealth: Payer: Self-pay

## 2022-12-15 NOTE — Telephone Encounter (Signed)
PA initiated via Covermymeds; KEY: [id number]. Awaiting determination.

## 2022-12-16 NOTE — Telephone Encounter (Signed)
PA approved. Effective 12/15/2022 to 12/15/2023

## 2022-12-31 ENCOUNTER — Encounter: Payer: Self-pay | Admitting: Cardiology

## 2023-01-03 ENCOUNTER — Telehealth: Payer: Self-pay

## 2023-01-03 ENCOUNTER — Ambulatory Visit: Payer: 59 | Admitting: Family Medicine

## 2023-01-03 NOTE — Telephone Encounter (Signed)
Spoke to patient and went over [facility] instructions. Patient voiced an understanding and verbalized all of her questions was answered. Advised patient if she had any other questions to feel free to call.

## 2023-01-03 NOTE — Telephone Encounter (Signed)
**Note De-Identified Sontee Desena Obfuscation** Ordering provider: Dr Bjorn Pippin Associated diagnoses: Snoring-R06.83 and Daytime Somnolence: R40.0 WatchPAT PA obtained on 01/03/2023 by Tamara Monteith, Lorelle Formosa, LPN. Authorization: Per the [employer] [facility] website: No PA REQ for CPT Code: 40981 If you're a participating provider, no precertification is required when this service is performed as an outpatient procedure for a medical or surgical diagnosis.  4.   Patient notified of PIN ([phone]) on 01/03/2023 Michail Boyte Notification Method: MyChart message.  Phone note routed to covering staff for follow-up.

## 2023-01-12 NOTE — Progress Notes (Signed)
[name] [address] [place] [address] Phone: [phone] Subjective:   Lauren Hall, am serving as a scribe for Dr. Antoine Primas.  I'm seeing this patient by the request  of:  Bradd Canary, MD  CC: Neck and back pain  [id number]  Lauren Hall is a 52 y.o. female coming in with complaint of back and neck pain. OMT on 11/30/2022. Patient states wants an injection in R lower back.   Medications patient has been prescribed:   Taking:         Reviewed prior external information including notes and imaging from previsou exam, outside providers and external EMR if available.   As well as notes that were available from care everywhere and other healthcare systems.  Past medical history, social, surgical and family history all reviewed in electronic medical record.  No pertanent information unless stated regarding to the chief complaint.   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   Complication of anesthesia    patient woke up while they were sewing up her incision with breast lumpectomy   Depression    Diabetes (HCC)    Dysrhythmia    Tachycardia   Enlarged thyroid    Fatigue 10/08/2010   Fibromyalgia 2009   GERD (gastroesophageal reflux disease) 09/26/2013   Headache(784.0) 10/08/2010   Migraines   Hip pain, bilateral 11/06/2010   Hyperglycemia 08/11/2015   normal Hgb A 1C per patient   Hypertension    Hypothyroidism 10/08/2010   Low back pain radiating to right leg 03/11/2015   Other and unspecified hyperlipidemia 07/29/2012   Peripheral neuropathy 11/11/2012   burning sensation mainly right leg   Poor concentration 01/11/2011   Preventative health care 07/29/2012   S/P laparoscopic hysterectomy 10/07/2015   Sinusitis, acute 07/27/2011   Tachycardia    Thyroid disease  10/08/2010   Tick bite of flank 11/09/2011   Tinea corporis 11/11/2012   Uterine leiomyoma     Allergies  Allergen Reactions   Promethazine Anaphylaxis   Promethazine Hcl Anaphylaxis     Review of Systems:  No headache, visual changes, nausea, vomiting, diarrhea, constipation, dizziness, abdominal pain, skin rash, fevers, chills, night sweats, weight loss, swollen lymph nodes, body aches, joint swelling, chest pain, shortness of breath, mood changes. POSITIVE muscle aches  Objective  Blood pressure 122/74, pulse 89, height 4\' 11"  (1.499 m), weight 165 lb (74.8 kg), last menstrual period 09/02/2015, SpO2 96%.   General: No apparent distress alert and oriented x3 mood and affect normal, dressed appropriately.  HEENT: Pupils equal, extraocular movements intact  Respiratory: Patient's speak in full sentences and does not appear short of breath  Cardiovascular: No lower extremity edema, non tender, no erythema  Gait MSK:  Back low back does have significant tenderness over the sacroiliac joint.  Moderate to severe tenderness over the left sacroiliac joint noted today.  Positive FABER test noted.  Right shoulder exam shows the patient does have good range of motion noted.  Multiple trigger points noted though on the trapezius, levator scapula and rhomboid of the right shoulder region.  Some limited range of motion noted.  Osteopathic findings  C2 flexed rotated and side bent right C7 flexed rotated and side bent left T3 extended rotated and side bent right inhaled rib T9 extended rotated and side bent left  L2 flexed rotated and side bent right Sacrum left on left  After verbal consent patient was prepped with alcohol swab and with a 25-gauge half inch needle injected into 3 distinct trigger points in the right shoulder area.  These include the levator scapula, rhomboid, trapezius.  Total of 3 cc of 0.5% Marcaine and 1 cc of Kenalog 40 mg/mL.  No blood loss, Band-Aid placed.   Postinjection instructions given.  After verbal consent patient was prepped with alcohol swab and with a 20-gauge 2 inch needle injected with 0.5 cc of 0.5% Marcaine and 1 cc of Kenalog 40 mg/mL into the left sacroiliac joint no blood loss.  Band-Aid placed.  Postinjection instructions given.     Assessment and Plan:  Arthritis of left sacroiliac joint (HCC) Mild to moderate arthritis, given injection today, continued on the left side.  Discussed icing regimen and home exercises, discussed core strengthening and hip abductor strengthening.  No significant change in medications.  Follow-up again in 6 to 8 weeks  Trigger point of right shoulder region Patient given injection and tolerated the procedure well, discussed icing regimen and home exercises, increase activity slowly over the course the next several weeks.  Discussed with patient about the potential for hypopigmentation.  Able to take a deep breath without any significant difficulty.  Follow-up with me again in 6 to 8 weeks otherwise.    Nonallopathic problems  Decision today to treat with OMT was based on Physical Exam  After verbal consent patient was treated with HVLA, ME, FPR techniques in cervical, rib, thoracic, lumbar, and sacral  areas  Patient tolerated the procedure well with improvement in symptoms  Patient given exercises, stretches and lifestyle modifications  See medications in patient instructions if given  Patient will follow up in 4-8 weeks     The above documentation has been reviewed and is accurate and complete Lauren Saa, DO         Note: This dictation was prepared with Dragon dictation along with smaller phrase technology. Any transcriptional errors that result from this process are unintentional.

## 2023-01-13 ENCOUNTER — Ambulatory Visit: Payer: 59 | Admitting: Family Medicine

## 2023-01-13 VITALS — BP 122/74 | HR 89 | Ht 59.0 in | Wt 165.0 lb

## 2023-01-13 DIAGNOSIS — M25511 Pain in right shoulder: Secondary | ICD-10-CM

## 2023-01-13 DIAGNOSIS — M25512 Pain in left shoulder: Secondary | ICD-10-CM | POA: Diagnosis not present

## 2023-01-13 DIAGNOSIS — M9903 Segmental and somatic dysfunction of lumbar region: Secondary | ICD-10-CM | POA: Diagnosis not present

## 2023-01-13 DIAGNOSIS — M9904 Segmental and somatic dysfunction of sacral region: Secondary | ICD-10-CM | POA: Diagnosis not present

## 2023-01-13 DIAGNOSIS — M9902 Segmental and somatic dysfunction of thoracic region: Secondary | ICD-10-CM

## 2023-01-13 DIAGNOSIS — M461 Sacroiliitis, not elsewhere classified: Secondary | ICD-10-CM

## 2023-01-13 DIAGNOSIS — M9901 Segmental and somatic dysfunction of cervical region: Secondary | ICD-10-CM | POA: Diagnosis not present

## 2023-01-13 DIAGNOSIS — M9908 Segmental and somatic dysfunction of rib cage: Secondary | ICD-10-CM

## 2023-01-13 NOTE — Assessment & Plan Note (Signed)
Mild to moderate arthritis, given injection today, continued on the left side.  Discussed icing regimen and home exercises, discussed core strengthening and hip abductor strengthening.  No significant change in medications.  Follow-up again in 6 to 8 weeks

## 2023-01-13 NOTE — Patient Instructions (Addendum)
Injection in SI joint and trigger point injection See you again in 2-3 months

## 2023-01-13 NOTE — Assessment & Plan Note (Signed)
Patient given injection and tolerated the procedure well, discussed icing regimen and home exercises, increase activity slowly over the course the next several weeks.  Discussed with patient about the potential for hypopigmentation.  Able to take a deep breath without any significant difficulty.  Follow-up with me again in 6 to 8 weeks otherwise.

## 2023-01-14 ENCOUNTER — Encounter: Payer: Self-pay | Admitting: Family Medicine

## 2023-01-18 ENCOUNTER — Encounter: Payer: Self-pay | Admitting: Family Medicine

## 2023-01-18 ENCOUNTER — Other Ambulatory Visit: Payer: Self-pay | Admitting: Family Medicine

## 2023-01-18 MED ORDER — LISINOPRIL 10 MG PO TABS: 10.0000 mg | ORAL_TABLET | Freq: Two times a day (BID) | ORAL | 0 refills | Status: DC

## 2023-01-19 DIAGNOSIS — F332 Major depressive disorder, recurrent severe without psychotic features: Secondary | ICD-10-CM | POA: Diagnosis not present

## 2023-02-03 DIAGNOSIS — F332 Major depressive disorder, recurrent severe without psychotic features: Secondary | ICD-10-CM | POA: Diagnosis not present

## 2023-02-07 ENCOUNTER — Telehealth: Payer: 59 | Admitting: Family Medicine

## 2023-02-08 ENCOUNTER — Telehealth: Payer: Self-pay

## 2023-02-08 ENCOUNTER — Telehealth: Payer: 59 | Admitting: Family Medicine

## 2023-02-08 NOTE — Telephone Encounter (Signed)
 Left patient a detailed vm asking that she give me a call back to confirm colonoscopy appt that is scheduled at [facility] on 02/10/23 at 7:30 am, arriving at 6 am. Need to confirm that patient has prep and prep instructions. Will send MyChart message as well.

## 2023-02-09 ENCOUNTER — Encounter: Payer: Self-pay | Admitting: Family Medicine

## 2023-02-09 ENCOUNTER — Telehealth: Payer: Self-pay | Admitting: Gastroenterology

## 2023-02-09 NOTE — Telephone Encounter (Signed)
 Returned call to patient. Pt states that she did not read her instructions in time and took Trulicity  on Monday (02/07/23). Patient was very apologetic and upset that she made this mistake. I offered pt the next available hospital date (04/18/23), but she needs a Tuesday or Thursday appt because her husband is her care partner and he has dialysis MWF. I informed patient that we do not have our April schedule at this time, but will reach out to her once it is available. Pt's procedure has been taken off of [facility] schedule for tomorrow.

## 2023-02-09 NOTE — Telephone Encounter (Signed)
 Yes, I was afraid of that. Thank you for checking on and then letting me know. She will need to be rescheduled to a later hospital date.  Fortunately, this is a routine screening exam.  Ellwood Dense MD

## 2023-02-09 NOTE — Telephone Encounter (Signed)
 [place],  Please see the attached message from the [facility] nurse who took this call from the patient and attend to her questions regarding medication and prep instructions for tomorrow's procedure at [facility].  Yes, that is fine that she took her insulin as scheduled yesterday.  Please review our medication guidelines for diabetic patients and dosing instructions with her.  Please also make sure she has been off her Trulicity  for the last week.  VEAR Brand MD

## 2023-02-09 NOTE — Telephone Encounter (Signed)
Reminder in epic °

## 2023-02-09 NOTE — Telephone Encounter (Signed)
 This has been addressed, see 02/09/23 telephone encounter.

## 2023-02-09 NOTE — Telephone Encounter (Signed)
 This patient is scheduled for procedure tomorrow  at the [facility]. Please route to office nurse. Thank you

## 2023-02-09 NOTE — Telephone Encounter (Signed)
 Patient called and stated that she would like to go over her instructions due to her procedure time changing. Patient also stated that she has since changed her insulin medication and would like to know if taking her insulin yesterday was ok for her to take. Patient procedure is tomorrow at 7:30 AM. Patient is requesting a call back. Please advise.

## 2023-02-13 NOTE — Progress Notes (Signed)
 Cardiology Office Note:    Date:  02/16/2023   ID:  Lauren Hall, DOB November 11, 1970, MRN [id number]  PCP:  Lauren Harlene LABOR, MD  Cardiologist:  Lauren Huntsman, DO  Electrophysiologist:  None   Referring MD: Lauren Harlene LABOR, MD   Chief Complaint  Patient presents with   Chest Pain         History of Present Illness:    Lauren Hall is a 53 y.o. female with a hx of Arnold-Chiari syndrome, fibromyalgia, GERD, hypertension, hypothyroidism who presents for follow-up.  She was referred by Dr. Domenica for evaluation of chest pain and palpitations, initially seen on 10/18/2022.  She previously followed with Dr. Huntsman, last seen 06/2019.  Echocardiogram 05/09/2019 showed normal biventricular function, grade 1 diastolic dysfunction, no significant valvular disease.  Lexiscan  Myoview  06/04/2019 showed normal perfusion, EF 70%.  Zio patch x 7 days 06/2019 showed no significant arrhythmias.  Coronary CTA 11/09/2022 showed normal coronary arteries, calcium  score 0.  Zio patch x 11 days on 10/2022 showed no significant arrhythmias.  Since last clinic visit, she reports she is doing well.  Denies any chest pain, dyspnea, lightheadedness, syncope, lower extremity edema, or palpitations.  Not exercising due to pain from fibromyalgia.  Weight down 10 lbs in last 4 months.    BP Readings from Last 3 Encounters:  02/15/23 122/76  01/13/23 122/74  11/30/22 116/70   Wt Readings from Last 3 Encounters:  02/15/23 162 lb (73.5 kg)  01/13/23 165 lb (74.8 kg)  11/30/22 170 lb (77.1 kg)      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   Complication of anesthesia    patient woke up while they were sewing up her incision with breast lumpectomy   Depression    Diabetes (HCC)    Dysrhythmia    Tachycardia   Enlarged thyroid     Fatigue 10/08/2010   Fibromyalgia 2009   GERD  (gastroesophageal reflux disease) 09/26/2013   Headache(784.0) 10/08/2010   Migraines   Hip pain, bilateral 11/06/2010   Hyperglycemia 08/11/2015   normal Hgb A 1C per patient   Hypertension    Hypothyroidism 10/08/2010   Low back pain radiating to right leg 03/11/2015   Other and unspecified hyperlipidemia 07/29/2012   Peripheral neuropathy 11/11/2012   burning sensation mainly right leg   Poor concentration 01/11/2011   Preventative health care 07/29/2012   S/P laparoscopic hysterectomy 10/07/2015   Sinusitis, acute 07/27/2011   Tachycardia    Thyroid  disease 10/08/2010   Tick bite of flank 11/09/2011   Tinea corporis 11/11/2012   Uterine leiomyoma     Past Surgical History:  Procedure Laterality Date   BREAST LUMPECTOMY Right 1999   benign    CYSTOSCOPY N/A 10/07/2015   Procedure: CYSTOSCOPY;  Surgeon: Lauren Botts, MD;  Location: [facility];  Service: Gynecology;  Laterality: N/A;    Current Medications: Current Meds  Medication Sig   ALPRAZolam  (XANAX ) 1 MG tablet Take one tablet twice daily.   atorvastatin  (LIPITOR) 10 MG tablet Take 0.5 tablets (5 mg total) by mouth daily.   Blood Glucose Monitoring Suppl DEVI 1 each by Does not apply route in the morning, at noon, and at bedtime. May substitute to any manufacturer covered by patient's insurance.   busPIRone  (BUSPAR ) 5 MG tablet TAKE 1 TABLET(5 MG) BY MOUTH THREE TIMES DAILY  cyclobenzaprine  (FLEXERIL ) 10 MG tablet Take 0.5-1 tablets (5-10 mg total) by mouth 3 (three) times daily as needed for muscle spasms.   Dulaglutide  (TRULICITY ) 0.75 MG/0.5ML SOAJ Inject 0.75 mg into the skin once a week.   gabapentin  (NEURONTIN ) 300 MG capsule TAKE 1 CAPSULE BY MOUTH TWICE A DAY AND TAKE 2 CAPSULES BY MOUTH AT BEDTIME   Glucose Blood (BLOOD GLUCOSE TEST STRIPS) STRP Check blood sugars 3 times daily   Lancets (ONETOUCH DELICA PLUS LANCET30G) MISC Check blood sugars 3 times daily   levothyroxine  (SYNTHROID ) 25 MCG tablet Take 1 tablet  (25 mcg total) by mouth daily before breakfast.   lisinopril  (ZESTRIL ) 10 MG tablet Take 1 tablet (10 mg total) by mouth in the morning and at bedtime.   LOW-DOSE ASPIRIN PO    omeprazole  (PRILOSEC) 40 MG capsule Take 1 capsule (40 mg total) by mouth daily.   ondansetron  (ZOFRAN ) 4 MG tablet TAKE 1 TABLET BY MOUTH EVERY 8 HOURS AS NEEDED FOR NAUSEA AND VOMITING   venlafaxine  XR (EFFEXOR -XR) 150 MG 24 hr capsule Take one capsule every morning.     Allergies:   Promethazine and Promethazine hcl   Social History   Socioeconomic History   Marital status: Married    Spouse name: Lauren Hall   Number of children: Not on file   Years of education: Not on file   Highest education level: Not on file  Occupational History   Not on file  Tobacco Use   Smoking status: Never   Smokeless tobacco: Never  Substance and Sexual Activity   Alcohol use: Yes    Comment: special occassion- very rarely   Drug use: No   Sexual activity: Yes    Partners: Male    Birth control/protection: None  Other Topics Concern   Not on file  Social History Narrative   Not on file   Social Drivers of Health   Financial Resource Strain: Not on file  Food Insecurity: Not on file  Transportation Needs: Not on file  Physical Activity: Not on file  Stress: Not on file  Social Connections: Not on file     Family History: The patient's family history includes Alcohol abuse in her brother and father; Alzheimer's disease in her paternal grandfather; Arthritis in her mother; Colon cancer in her father and maternal grandmother; Diabetes in her maternal grandfather, maternal grandmother, and mother; Fibromyalgia in her mother; Heart disease in her maternal grandmother; Hyperlipidemia in her maternal grandmother and mother; Hypertension in her brother and mother; Ovarian cancer in her paternal grandmother; Stroke in her maternal grandfather.  ROS:   Please see the history of present illness.     All other systems reviewed  and are negative.  EKGs/Labs/Other Studies Reviewed:    The following studies were reviewed today:   EKG:   10/18/2022: Normal sinus rhythm, rate 96, LVH, T wave inversions in leads I/aVL  Recent Labs: 11/02/2022: ALT 37; BUN 5; Creatinine, Ser 0.71; Hemoglobin 13.1; Platelets 360.0; Potassium 3.4; Sodium 139; TSH 1.32  Recent Lipid Panel    Component Value Date/Time   CHOL 151 11/02/2022 1147   TRIG 149.0 11/02/2022 1147   HDL 50.60 11/02/2022 1147   CHOLHDL 3 11/02/2022 1147   VLDL 29.8 11/02/2022 1147   LDLCALC 71 11/02/2022 1147   LDLDIRECT 146.0 09/19/2020 1632    Physical Exam:    VS:  BP 122/76 (BP Location: Right Arm, Patient Position: Sitting, Cuff Size: Normal)   Pulse 97   Ht 4' 11 (  1.499 m)   Wt 162 lb (73.5 kg)   LMP 09/02/2015 (Approximate) Comment: continous bleeding  SpO2 96%   BMI 32.72 kg/m     Wt Readings from Last 3 Encounters:  02/15/23 162 lb (73.5 kg)  01/13/23 165 lb (74.8 kg)  11/30/22 170 lb (77.1 kg)     GEN:  Well nourished, well developed in no acute distress HEENT: Normal NECK: No JVD; No carotid bruits LYMPHATICS: No lymphadenopathy CARDIAC: RRR, no murmurs, rubs, gallops RESPIRATORY:  Clear to auscultation without rales, wheezing or rhonchi  ABDOMEN: Soft, non-tender, non-distended MUSCULOSKELETAL:  No edema; No deformity  SKIN: Warm and dry NEUROLOGIC:  Alert and oriented x 3 PSYCHIATRIC:  Normal affect   ASSESSMENT:    1. Precordial pain   2. Palpitations   3. Hypertension, unspecified type   4. Snores   5. Hyperlipidemia, unspecified hyperlipidemia type     PLAN:    Chest pain: Atypical in description.  Coronary CTA 11/09/2022 showed normal coronary arteries, calcium  score 0.    Palpitations: Zio patch x 11 days on 10/2022 showed no significant arrhythmias.  Hypertension: On lisinopril  20 mg daily and amlodipine  5 mg daily.  Appears controlled  Hyperlipidemia: On atorvastatin  5 mg daily.  LDL 71 on 11/02/2022.   Normal coronary arteries on CTA 11/2022 as above  T2DM: A1c 6.6% on 11/02/22.  On Trulicity .   Snoring/daytime somnolence/observed apnea: Concern for OSA, will check Itamar sleep study.  STOP-BANG 5. -Itamar given at last clinic visit but reports she is getting error message and has not been able to wear.  Will send message to sleep coordinator  Obesity: Body mass index is 32.72 kg/m.  Has lost 10 lbs in last 4 months  RTC in 1 year  Medication Adjustments/Labs and Tests Ordered: Current medicines are reviewed at length with the patient today.  Concerns regarding medicines are outlined above.  No orders of the defined types were placed in this encounter.  No orders of the defined types were placed in this encounter.   Patient Instructions  Medication Instructions:  Your physician recommends that you continue on your current medications as directed. Please refer to the Current Medication list given to you today.    Lab Work: None    If you have labs (blood work) drawn today and your tests are completely normal, you will receive your results only by: MyChart Message (if you have MyChart) OR A paper copy in the mail If you have any lab test that is abnormal or we need to change your treatment, we will call you to review the results.   Testing/Procedures: None    Follow-Up: At [facility], you and your health needs are our priority.  As part of our continuing mission to provide you with exceptional heart care, we have created designated Provider Care Teams.  These Care Teams include your primary Cardiologist (physician) and Advanced Practice Providers (APPs -  Physician Assistants and Nurse Practitioners) who all work together to provide you with the care you need, when you need it.  We recommend signing up for the patient portal called MyChart.  Sign up information is provided on this After Visit Summary.  MyChart is used to connect with patients for Virtual Visits  (Telemedicine).  Patients are able to view lab/test results, encounter notes, upcoming appointments, etc.  Non-urgent messages can be sent to your provider as well.   To learn more about what you can do with MyChart, go to [email or web address].  Your next appointment:   1 year(s)  The format for your next appointment:   In Person  Provider:   Lonni Nanas, MD    Other Instructions    Signed, Lonni LITTIE Nanas, MD  02/16/2023 6:11 AM    [facility]

## 2023-02-15 ENCOUNTER — Other Ambulatory Visit: Payer: Self-pay | Admitting: Family Medicine

## 2023-02-15 ENCOUNTER — Ambulatory Visit: Payer: 59 | Attending: Cardiology | Admitting: Cardiology

## 2023-02-15 ENCOUNTER — Encounter: Payer: Self-pay | Admitting: Cardiology

## 2023-02-15 VITALS — BP 122/76 | HR 97 | Ht 59.0 in | Wt 162.0 lb

## 2023-02-15 DIAGNOSIS — R002 Palpitations: Secondary | ICD-10-CM

## 2023-02-15 DIAGNOSIS — E785 Hyperlipidemia, unspecified: Secondary | ICD-10-CM

## 2023-02-15 DIAGNOSIS — R0683 Snoring: Secondary | ICD-10-CM | POA: Diagnosis not present

## 2023-02-15 DIAGNOSIS — I10 Essential (primary) hypertension: Secondary | ICD-10-CM | POA: Diagnosis not present

## 2023-02-15 DIAGNOSIS — R072 Precordial pain: Secondary | ICD-10-CM

## 2023-02-15 NOTE — Patient Instructions (Signed)
 Medication Instructions:  Your physician recommends that you continue on your current medications as directed. Please refer to the Current Medication list given to you today.    Lab Work: None    If you have labs (blood work) drawn today and your tests are completely normal, you will receive your results only by: MyChart Message (if you have MyChart) OR A paper copy in the mail If you have any lab test that is abnormal or we need to change your treatment, we will call you to review the results.   Testing/Procedures: None    Follow-Up: At [facility], you and your health needs are our priority.  As part of our continuing mission to provide you with exceptional heart care, we have created designated Provider Care Teams.  These Care Teams include your primary Cardiologist (physician) and Advanced Practice Providers (APPs -  Physician Assistants and Nurse Practitioners) who all work together to provide you with the care you need, when you need it.  We recommend signing up for the patient portal called MyChart.  Sign up information is provided on this After Visit Summary.  MyChart is used to connect with patients for Virtual Visits (Telemedicine).  Patients are able to view lab/test results, encounter notes, upcoming appointments, etc.  Non-urgent messages can be sent to your provider as well.   To learn more about what you can do with MyChart, go to [email or web address].    Your next appointment:   1 year(s)  The format for your next appointment:   In Person  Provider:   Lonni Nanas, MD    Other Instructions

## 2023-02-19 ENCOUNTER — Other Ambulatory Visit: Payer: Self-pay | Admitting: Adult Health

## 2023-02-19 DIAGNOSIS — F411 Generalized anxiety disorder: Secondary | ICD-10-CM

## 2023-02-20 NOTE — Assessment & Plan Note (Signed)
hgba1c acceptable, minimize simple carbs. Increase exercise as tolerated. Continue current meds 

## 2023-02-20 NOTE — Assessment & Plan Note (Signed)
Well controlled, no changes to meds. Encouraged heart healthy diet such as the DASH diet and exercise as tolerated.  °

## 2023-02-20 NOTE — Assessment & Plan Note (Addendum)
Well controlled, no changes to meds. Encouraged heart healthy diet such as the DASH diet and exercise as tolerated. Tolerating Atorvastatin

## 2023-02-20 NOTE — Assessment & Plan Note (Signed)
On Levothyroxine, continue to monitor 

## 2023-02-20 NOTE — Assessment & Plan Note (Signed)
Doing well on Venlafaxine

## 2023-02-23 ENCOUNTER — Encounter: Payer: Self-pay | Admitting: Family Medicine

## 2023-02-23 ENCOUNTER — Other Ambulatory Visit (INDEPENDENT_AMBULATORY_CARE_PROVIDER_SITE_OTHER): Payer: 59

## 2023-02-23 DIAGNOSIS — E782 Mixed hyperlipidemia: Secondary | ICD-10-CM

## 2023-02-23 DIAGNOSIS — I10 Essential (primary) hypertension: Secondary | ICD-10-CM

## 2023-02-23 DIAGNOSIS — E785 Hyperlipidemia, unspecified: Secondary | ICD-10-CM | POA: Diagnosis not present

## 2023-02-23 DIAGNOSIS — E1169 Type 2 diabetes mellitus with other specified complication: Secondary | ICD-10-CM | POA: Diagnosis not present

## 2023-02-23 LAB — COMPREHENSIVE METABOLIC PANEL
ALT: 15 U/L (ref 0–35)
AST: 13 U/L (ref 0–37)
Albumin: 4.6 g/dL (ref 3.5–5.2)
Alkaline Phosphatase: 102 U/L (ref 39–117)
BUN: 6 mg/dL (ref 6–23)
CO2: 32 meq/L (ref 19–32)
Calcium: 9.4 mg/dL (ref 8.4–10.5)
Chloride: 101 meq/L (ref 96–112)
Creatinine, Ser: 0.92 mg/dL (ref 0.40–1.20)
GFR: 70.72 mL/min (ref >60.00)
Glucose, Bld: 90 mg/dL (ref 70–99)
Potassium: 3.8 meq/L (ref 3.5–5.1)
Sodium: 140 meq/L (ref 135–145)
Total Bilirubin: 0.4 mg/dL (ref 0.2–1.2)
Total Protein: 7.7 g/dL (ref 6.0–8.3)

## 2023-02-23 LAB — CBC WITH DIFFERENTIAL/PLATELET
Basophils Absolute: 0 10*3/uL (ref 0.0–0.1)
Basophils Relative: 0.3 % (ref 0.0–3.0)
Eosinophils Absolute: 0.1 10*3/uL (ref 0.0–0.7)
Eosinophils Relative: 1.1 % (ref 0.0–5.0)
HCT: 44.3 % (ref 36.0–46.0)
Hemoglobin: 14.8 g/dL (ref 12.0–15.0)
Lymphocytes Relative: 40.9 % (ref 12.0–46.0)
Lymphs Abs: 2.6 10*3/uL (ref 0.7–4.0)
MCHC: 33.4 g/dL (ref 30.0–36.0)
MCV: 91.6 fL (ref 78.0–100.0)
Monocytes Absolute: 0.4 10*3/uL (ref 0.1–1.0)
Monocytes Relative: 5.8 % (ref 3.0–12.0)
Neutro Abs: 3.3 10*3/uL (ref 1.4–7.7)
Neutrophils Relative %: 51.9 % (ref 43.0–77.0)
Platelets: 347 10*3/uL (ref 150.0–400.0)
RBC: 4.84 Mil/uL (ref 3.87–5.11)
RDW: 13.8 % (ref 11.5–15.5)
WBC: 6.4 10*3/uL (ref 4.0–10.5)

## 2023-02-23 LAB — LIPID PANEL
Cholesterol: 178 mg/dL (ref 0–200)
HDL: 80.5 mg/dL (ref >39.00)
LDL Cholesterol: 81 mg/dL (ref 0–99)
NonHDL: 97.26
Total CHOL/HDL Ratio: 2
Triglycerides: 83 mg/dL (ref 0.0–149.0)
VLDL: 16.6 mg/dL (ref 0.0–40.0)

## 2023-02-23 LAB — HEMOGLOBIN A1C: Hgb A1c MFr Bld: 6.1 % (ref 4.6–6.5)

## 2023-02-23 LAB — TSH: TSH: 0.17 u[IU]/mL — ABNORMAL LOW (ref 0.35–5.50)

## 2023-02-24 ENCOUNTER — Other Ambulatory Visit: Payer: Self-pay | Admitting: *Deleted

## 2023-02-24 ENCOUNTER — Telehealth (INDEPENDENT_AMBULATORY_CARE_PROVIDER_SITE_OTHER): Payer: 59 | Admitting: Family Medicine

## 2023-02-24 ENCOUNTER — Ambulatory Visit (INDEPENDENT_AMBULATORY_CARE_PROVIDER_SITE_OTHER): Payer: 59

## 2023-02-24 VITALS — BP 121/84 | HR 86 | Ht 59.0 in | Wt 158.8 lb

## 2023-02-24 DIAGNOSIS — Z7985 Long-term (current) use of injectable non-insulin antidiabetic drugs: Secondary | ICD-10-CM

## 2023-02-24 DIAGNOSIS — R7989 Other specified abnormal findings of blood chemistry: Secondary | ICD-10-CM

## 2023-02-24 DIAGNOSIS — E1169 Type 2 diabetes mellitus with other specified complication: Secondary | ICD-10-CM | POA: Diagnosis not present

## 2023-02-24 DIAGNOSIS — E782 Mixed hyperlipidemia: Secondary | ICD-10-CM | POA: Diagnosis not present

## 2023-02-24 DIAGNOSIS — F418 Other specified anxiety disorders: Secondary | ICD-10-CM | POA: Diagnosis not present

## 2023-02-24 DIAGNOSIS — E785 Hyperlipidemia, unspecified: Secondary | ICD-10-CM

## 2023-02-24 DIAGNOSIS — E039 Hypothyroidism, unspecified: Secondary | ICD-10-CM | POA: Diagnosis not present

## 2023-02-24 DIAGNOSIS — I10 Essential (primary) hypertension: Secondary | ICD-10-CM

## 2023-02-24 LAB — T4, FREE: Free T4: 0.86 ng/dL (ref 0.60–1.60)

## 2023-02-24 MED ORDER — TIZANIDINE HCL 2 MG PO TABS: 1.0000 mg | ORAL_TABLET | Freq: Three times a day (TID) | ORAL | 3 refills | Status: DC | PRN

## 2023-02-24 MED ORDER — HYDROCODONE-ACETAMINOPHEN 5-325 MG PO TABS: 1.0000 | ORAL_TABLET | Freq: Four times a day (QID) | ORAL | 0 refills | Status: DC | PRN

## 2023-02-24 MED ORDER — CELECOXIB 200 MG PO CAPS
200.0000 mg | ORAL_CAPSULE | Freq: Two times a day (BID) | ORAL | 3 refills | Status: DC
Start: 2023-02-24 — End: 2023-04-12

## 2023-02-24 NOTE — Patient Instructions (Signed)
Tylenol/Acetaminophen/APAP ES 500 mg tabs 1-2 tabs up to 3 x daily max of 3000 mg of Acetaminophen in 24 hours

## 2023-02-24 NOTE — Progress Notes (Signed)
fr

## 2023-02-25 ENCOUNTER — Telehealth: Payer: Self-pay

## 2023-02-25 ENCOUNTER — Encounter: Payer: Self-pay | Admitting: Family Medicine

## 2023-02-25 ENCOUNTER — Encounter: Payer: Self-pay | Admitting: Family

## 2023-02-25 ENCOUNTER — Other Ambulatory Visit: Payer: Self-pay | Admitting: Adult Health

## 2023-02-25 DIAGNOSIS — F411 Generalized anxiety disorder: Secondary | ICD-10-CM

## 2023-02-25 NOTE — Telephone Encounter (Signed)
LF 12/24; LV 10/25; NV 1/27

## 2023-02-25 NOTE — Telephone Encounter (Signed)
Tried initiating PA via Covermymeds; KEY: [id number].   Informed PA resolved. Called and spoke w/ [facility]- medication is $2 dollars, they are getting it ready for her.

## 2023-02-26 ENCOUNTER — Encounter: Payer: Self-pay | Admitting: Family Medicine

## 2023-02-26 NOTE — Progress Notes (Signed)
MyChart Video Visit    Virtual Visit via Video Note   This patient is at least at moderate risk for complications without adequate follow up. This format is felt to be most appropriate for this patient at this time. Physical exam was limited by quality of the video and audio technology used for the visit. [name], [name] was able to get the patient set up on a video visit.  Patient location: home Patient and provider in visit Provider location: Office  I discussed the limitations of evaluation and management by telemedicine and the availability of in person appointments. The patient expressed understanding and agreed to proceed.  Visit Date: 02/24/2023  Today's healthcare provider: Danise Edge, MD     Subjective:    Patient ID: Lauren Hall, female    DOB: Aug 04, 1970, 53 y.o.   MRN: [id number]  Chief Complaint  Patient presents with   Follow-up    3 month    HPI Discussed the use of AI scribe software for clinical note transcription with the patient, who gave verbal consent to proceed.  History of Present Illness   The patient, with a history of diabetes, fibromyalgia, and thyroid issues, presents with improved blood sugar levels due to dietary changes and medication. They have cut out sweetened drinks and have been mindful about their sugar intake. They also report drinking a new mushroom coffee which has helped with their brain fog. They have lost weight, dropping from 170 to 158 pounds, which they attribute to their medication and dietary changes.  The patient also presents with chronic pain, which has been ongoing for 16 years and has significantly impacted their quality of life. The pain is so severe that it has led to self-isolation and avoidance of activities they previously enjoyed. The patient reports that the pain is not adequately managed by their current medication regimen, which includes gabapentin and a muscle relaxer. They express a desire for a more effective  pain management plan.        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   Complication of anesthesia    patient woke up while they were sewing up her incision with breast lumpectomy   Depression    Diabetes (HCC)    Dysrhythmia    Tachycardia   Enlarged thyroid    Fatigue 10/08/2010   Fibromyalgia 2009   GERD (gastroesophageal reflux disease) 09/26/2013   Headache(784.0) 10/08/2010   Migraines   Hip pain, bilateral 11/06/2010   Hyperglycemia 08/11/2015   normal Hgb A 1C per patient   Hypertension    Hypothyroidism 10/08/2010   Low back pain radiating to right leg 03/11/2015   Other and unspecified hyperlipidemia 07/29/2012   Peripheral neuropathy 11/11/2012   burning sensation mainly right leg   Poor concentration 01/11/2011   Preventative health care 07/29/2012   S/P laparoscopic hysterectomy 10/07/2015   Sinusitis, acute 07/27/2011   Tachycardia    Thyroid disease 10/08/2010   Tick bite of flank 11/09/2011   Tinea corporis 11/11/2012   Uterine leiomyoma     Past Surgical History:  Procedure Laterality Date   BREAST LUMPECTOMY Right 1999   benign    CYSTOSCOPY N/A 10/07/2015   Procedure: CYSTOSCOPY;  Surgeon: Essie Hart, MD;  Location: [facility];  Service: Gynecology;  Laterality: N/A;    Family History  Problem Relation Age of  Onset   Fibromyalgia Mother    Diabetes Mother        type 2   Hypertension Mother    Hyperlipidemia Mother    Arthritis Mother    Alcohol abuse Father    Colon cancer Father    Hypertension Brother    Alcohol abuse Brother    Heart disease Maternal Grandmother    Hyperlipidemia Maternal Grandmother    Diabetes Maternal Grandmother        type 2   Colon cancer Maternal Grandmother    Stroke Maternal Grandfather    Diabetes Maternal Grandfather        type 2   Ovarian cancer Paternal Grandmother     Alzheimer's disease Paternal Grandfather     Social History   Socioeconomic History   Marital status: Married    Spouse name: Derrick   Number of children: Not on file   Years of education: Not on file   Highest education level: Not on file  Occupational History   Not on file  Tobacco Use   Smoking status: Never   Smokeless tobacco: Never  Substance and Sexual Activity   Alcohol use: Yes    Comment: special occassion- very rarely   Drug use: No   Sexual activity: Yes    Partners: Male    Birth control/protection: None  Other Topics Concern   Not on file  Social History Narrative   Not on file   Social Drivers of Health   Financial Resource Strain: Not on file  Food Insecurity: Not on file  Transportation Needs: Not on file  Physical Activity: Not on file  Stress: Not on file  Social Connections: Not on file  Intimate Partner Violence: Not on file    Outpatient Medications Prior to Visit  Medication Sig Dispense Refill   atorvastatin (LIPITOR) 10 MG tablet Take 0.5 tablets (5 mg total) by mouth daily. 15 tablet 5   Blood Glucose Monitoring Suppl DEVI 1 each by Does not apply route in the morning, at noon, and at bedtime. May substitute to any manufacturer covered by patient's insurance. 1 each 0   busPIRone (BUSPAR) 5 MG tablet TAKE 1 TABLET(5 MG) BY MOUTH THREE TIMES DAILY 270 tablet 3   diazepam (VALIUM) 5 MG tablet Take one tablet by mouth with food one hour prior to procedure. May repeat 30 minutes prior if needed. 2 tablet 0   Dulaglutide (TRULICITY) 0.75 MG/0.5ML SOAJ Inject 0.75 mg into the skin once a week. 2 mL 1   gabapentin (NEURONTIN) 300 MG capsule TAKE 1 CAPSULE BY MOUTH TWICE A DAY AND TAKE 2 CAPSULES BY MOUTH AT BEDTIME 120 capsule 1   Glucose Blood (BLOOD GLUCOSE TEST STRIPS) STRP Check blood sugars 3 times daily 300 each 12   Lancets (ONETOUCH DELICA PLUS LANCET30G) MISC Check blood sugars 3 times daily 300 each 12   levothyroxine (SYNTHROID) 25 MCG  tablet Take 1 tablet (25 mcg total) by mouth daily before breakfast. 30 tablet 0   lisinopril (ZESTRIL) 10 MG tablet Take 1 tablet (10 mg total) by mouth in the morning and at bedtime. 180 tablet 0   LOW-DOSE ASPIRIN PO      omeprazole (PRILOSEC) 40 MG capsule Take 1 capsule (40 mg total) by mouth daily. 90 capsule 0   ondansetron (ZOFRAN) 4 MG tablet TAKE 1 TABLET BY MOUTH EVERY 8 HOURS AS NEEDED FOR NAUSEA AND VOMITING 18 tablet 3   venlafaxine XR (EFFEXOR-XR) 150 MG 24 hr capsule TAKE  1 CAPSULE BY MOUTH EVERY MORNING 30 capsule 0   ALPRAZolam (XANAX) 1 MG tablet Take one tablet twice daily. 60 tablet 2   cyclobenzaprine (FLEXERIL) 10 MG tablet Take 0.5-1 tablets (5-10 mg total) by mouth 3 (three) times daily as needed for muscle spasms. 90 tablet 0   metoprolol tartrate (LOPRESSOR) 100 MG tablet Take 100 mg 2 hours before Coronary CT 1 tablet 0   amLODipine (NORVASC) 5 MG tablet Take 1 tablet (5 mg total) by mouth daily. 90 tablet 3   methylPREDNISolone (MEDROL) 4 MG tablet 6 tabs po x 1 day then 5 tabs po x 1 day then 4 tabs po x 1 day then 3 tabs po x 1 day then 2 tabs po x 1 day then 1 tab po x 1 day and stop (Patient not taking: Reported on 02/15/2023) 21 tablet 0   venlafaxine XR (EFFEXOR-XR) 150 MG 24 hr capsule Take one capsule every morning. 90 capsule 3   No facility-administered medications prior to visit.    Allergies  Allergen Reactions   Promethazine Anaphylaxis   Promethazine Hcl Anaphylaxis    Review of Systems  Constitutional:  Positive for malaise/fatigue. Negative for fever.  HENT:  Negative for congestion.   Eyes:  Negative for blurred vision.  Respiratory:  Negative for shortness of breath.   Cardiovascular:  Negative for chest pain, palpitations and leg swelling.  Gastrointestinal:  Negative for abdominal pain, blood in stool and nausea.  Genitourinary:  Negative for dysuria and frequency.  Musculoskeletal:  Positive for back pain, joint pain, myalgias and neck  pain. Negative for falls.  Skin:  Negative for rash.  Neurological:  Negative for dizziness, loss of consciousness and headaches.  Endo/Heme/Allergies:  Negative for environmental allergies.  Psychiatric/Behavioral:  Positive for depression. Negative for suicidal ideas. The patient is nervous/anxious.        Objective:    Physical Exam Constitutional:      General: She is not in acute distress.    Appearance: Normal appearance. She is not ill-appearing or toxic-appearing.  HENT:     Head: Normocephalic and atraumatic.     Right Ear: External ear normal.     Left Ear: External ear normal.     Nose: Nose normal.  Eyes:     General:        Right eye: No discharge.        Left eye: No discharge.  Pulmonary:     Effort: Pulmonary effort is normal.  Skin:    Findings: No rash.  Neurological:     Mental Status: She is alert and oriented to person, place, and time.  Psychiatric:        Behavior: Behavior normal.     BP 121/84   Pulse 86   Ht 4\' 11"  (1.499 m) Comment: Pt stated  Wt 158 lb 12.8 oz (72 kg) Comment: Pt stated  LMP 09/02/2015 (Approximate) Comment: continous bleeding  BMI 32.07 kg/m  Wt Readings from Last 3 Encounters:  02/24/23 158 lb 12.8 oz (72 kg)  02/15/23 162 lb (73.5 kg)  01/13/23 165 lb (74.8 kg)       Assessment & Plan:  Mixed hyperlipidemia Assessment & Plan: Well controlled, no changes to meds. Encouraged heart healthy diet such as the DASH diet and exercise as tolerated. Tolerating Atorvastatin  Orders: -     Lipid panel; Future  Primary hypertension Assessment & Plan: Well controlled, no changes to meds. Encouraged heart healthy diet such as the DASH  diet and exercise as tolerated.    Orders: -     Comprehensive metabolic panel; Future -     CBC with Differential/Platelet; Future -     TSH; Future  Hypothyroidism, unspecified type Assessment & Plan: On Levothyroxine, continue to monitor  Orders: -     T4, free; Future -     TSH;  Future  Type 2 diabetes mellitus with hyperlipidemia (HCC) Assessment & Plan: hgba1c acceptable, minimize simple carbs. Increase exercise as tolerated. Continue current meds   Orders: -     Hemoglobin A1c; Future -     Microalbumin / creatinine urine ratio; Future  Depression with anxiety Assessment & Plan: Doing well on Venlafaxine.    Other orders -     Celecoxib; Take 1 capsule (200 mg total) by mouth 2 (two) times daily.  Dispense: 60 capsule; Refill: 3 -     tiZANidine HCl; Take 0.5-2 tablets (1-4 mg total) by mouth every 8 (eight) hours as needed for muscle spasms.  Dispense: 60 tablet; Refill: 3 -     HYDROcodone-Acetaminophen; Take 1 tablet by mouth every 6 (six) hours as needed for moderate pain (pain score 4-6).  Dispense: 30 tablet; Refill: 0     Assessment and Plan    Hypothyroidism TSH suppressed, but free T4 in the low normal range. Discussed the feedback loop between TSH and T4 and the importance of maintaining the current levothyroxine dose to prevent symptoms of hypothyroidism. -Continue current levothyroxine dose. -Recheck TSH and free T4 in 12 weeks.  Chronic Pain (Fibromyalgia and Arthritis) Severe, daily pain for 16 years, leading to self-isolation. Recent prednisone and injection provided temporary relief. Discussed the potential benefits of Celebrex, tizanidine, and hydrocodone. -Start Celebrex 200mg  twice daily. -Start tizanidine 2mg  tablets, 1-2 at bedtime and 0.5 in the morning. -Provide hydrocodone as a backup for severe pain episodes. -Consider consultation with pain specialist if pain persists or worsens.  Diabetes Hemoglobin A1C of 6.1, with daily blood glucose levels generally between 80 and 110. Discussed the benefits of Trulicity for weight control and diabetes management. -Continue current Trulicity dose. -Check blood glucose levels regularly.  Follow-up in 6 weeks to assess the effectiveness of the new pain management regimen. In-person  visit in 3-4 months to reassess overall health status.         I discussed the assessment and treatment plan with the patient. The patient was provided an opportunity to ask questions and all were answered. The patient agreed with the plan and demonstrated an understanding of the instructions.   The patient was advised to call back or seek an in-person evaluation if the symptoms worsen or if the condition fails to improve as anticipated.  Danise Edge, MD [facility] at [facility] [phone] (phone) [phone] (fax)  [facility]

## 2023-02-28 ENCOUNTER — Telehealth (INDEPENDENT_AMBULATORY_CARE_PROVIDER_SITE_OTHER): Payer: 59 | Admitting: Adult Health

## 2023-02-28 ENCOUNTER — Encounter: Payer: Self-pay | Admitting: Adult Health

## 2023-02-28 DIAGNOSIS — G4700 Insomnia, unspecified: Secondary | ICD-10-CM | POA: Diagnosis not present

## 2023-02-28 DIAGNOSIS — F411 Generalized anxiety disorder: Secondary | ICD-10-CM | POA: Diagnosis not present

## 2023-02-28 DIAGNOSIS — F331 Major depressive disorder, recurrent, moderate: Secondary | ICD-10-CM

## 2023-02-28 DIAGNOSIS — F410 Panic disorder [episodic paroxysmal anxiety] without agoraphobia: Secondary | ICD-10-CM | POA: Diagnosis not present

## 2023-02-28 MED ORDER — BUSPIRONE HCL 5 MG PO TABS: ORAL_TABLET | ORAL | 3 refills | Status: DC

## 2023-02-28 MED ORDER — VENLAFAXINE HCL ER 150 MG PO CP24: 150.0000 mg | ORAL_CAPSULE | Freq: Every morning | ORAL | 3 refills | Status: AC

## 2023-02-28 NOTE — Progress Notes (Signed)
Lauren Hall [id number] Jul 20, 1970 53 y.o.  Virtual Visit via Video Note  I connected with pt @ on 02/28/23 at  4:30 PM EST by a video enabled telemedicine application and verified that I am speaking with the correct person using two identifiers.   I discussed the limitations of evaluation and management by telemedicine and the availability of in person appointments. The patient expressed understanding and agreed to proceed.  I discussed the assessment and treatment plan with the patient. The patient was provided an opportunity to ask questions and all were answered. The patient agreed with the plan and demonstrated an understanding of the instructions.   The patient was advised to call back or seek an in-person evaluation if the symptoms worsen or if the condition fails to improve as anticipated.  I provided 25 minutes of non-face-to-face time during this encounter.  The patient was located at home.  The provider was located at [facility].   Dorothyann Gibbs, NP   Subjective:   Patient ID:  Lauren Hall is a 53 y.o. (DOB 06-Aug-1970) female.  Chief Complaint: No chief complaint on file.   HPI Lauren Hall presents for follow-up of MDD, GAD and insomnia and panic attacks.  Describes mood today as "about the same". Pleasant. Tearful at times. Mood symptoms - reports some depression - "not like before". Improved interest and motivation. Reports anxiety - situational and pain. Denies irritability. Denies panic attacks. Reports worry, rumination, and over thinking. Reports ongoing situational stressors. Mood is variable. Stating "I feel like I'm doing ok". Feels like medications are helpful. Working with a therapist - [name]. Taking medications as prescribed. Energy levels lower. Active, does not have a regular exercise routine with physical disabilities.  Enjoys some usual interests and activities. Married. Lives with husband and daughter. Appetite adequate.  Weight loss - 158 pounds. Sleeping better some nights than others.  Focus and concentration difficulties - "fibro fog". Completing tasks. Managing some aspects of household. 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  Denies AH or VH. Denies self harm. Denies substance use.  Previous medication trials: Pristiq, Wellbutrin, Prozac, Cymbalta, Lexapro, Zoloft,      Review of Systems:  Review of Systems  Musculoskeletal:  Negative for gait problem.  Neurological:  Negative for tremors.  Psychiatric/Behavioral:         Please refer to HPI    Medications: I have reviewed the patient's current medications.  Current Outpatient Medications  Medication Sig Dispense Refill   ALPRAZolam (XANAX) 1 MG tablet TAKE 1 TABLET BY MOUTH TWICE A DAY 60 tablet 2   amLODipine (NORVASC) 5 MG tablet Take 1 tablet (5 mg total) by mouth daily. 90 tablet 3   atorvastatin (LIPITOR) 10 MG tablet Take 0.5 tablets (5 mg total) by mouth daily. 15 tablet 5   Blood Glucose Monitoring Suppl DEVI 1 each by Does not apply route in the morning, at noon, and at bedtime. May substitute to any manufacturer covered by patient's insurance. 1 each 0   busPIRone (BUSPAR) 5 MG tablet TAKE 1 TABLET(5 MG) BY MOUTH THREE TIMES DAILY 270 tablet 3   celecoxib (CELEBREX) 200 MG capsule Take 1 capsule (200 mg total) by mouth 2 (two) times daily. 60 capsule 3   diazepam (VALIUM) 5 MG tablet Take one tablet by mouth with food one hour prior to procedure. May repeat 30 minutes prior if needed. 2 tablet 0   Dulaglutide (TRULICITY) 0.75 MG/0.5ML SOAJ Inject 0.75 mg into the  skin once a week. 2 mL 1   gabapentin (NEURONTIN) 300 MG capsule TAKE 1 CAPSULE BY MOUTH TWICE A DAY AND TAKE 2 CAPSULES BY MOUTH AT BEDTIME 120 capsule 1   Glucose Blood (BLOOD GLUCOSE TEST STRIPS) STRP Check blood sugars 3 times daily 300 each 12   HYDROcodone-acetaminophen (NORCO/VICODIN) 5-325 MG tablet Take 1 tablet by mouth every 6 (six) hours as needed for moderate  pain (pain score 4-6). 30 tablet 0   Lancets (ONETOUCH DELICA PLUS LANCET30G) MISC Check blood sugars 3 times daily 300 each 12   levothyroxine (SYNTHROID) 25 MCG tablet Take 1 tablet (25 mcg total) by mouth daily before breakfast. 30 tablet 0   lisinopril (ZESTRIL) 10 MG tablet Take 1 tablet (10 mg total) by mouth in the morning and at bedtime. 180 tablet 0   LOW-DOSE ASPIRIN PO      omeprazole (PRILOSEC) 40 MG capsule Take 1 capsule (40 mg total) by mouth daily. 90 capsule 0   ondansetron (ZOFRAN) 4 MG tablet TAKE 1 TABLET BY MOUTH EVERY 8 HOURS AS NEEDED FOR NAUSEA AND VOMITING 18 tablet 3   tiZANidine (ZANAFLEX) 2 MG tablet Take 0.5-2 tablets (1-4 mg total) by mouth every 8 (eight) hours as needed for muscle spasms. 60 tablet 3   venlafaxine XR (EFFEXOR-XR) 150 MG 24 hr capsule Take 1 capsule (150 mg total) by mouth every morning. 90 capsule 3   No current facility-administered medications for this visit.    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   Complication of anesthesia    patient woke up while they were sewing up her incision with breast lumpectomy   Depression    Diabetes (HCC)    Dysrhythmia    Tachycardia   Enlarged thyroid    Fatigue 10/08/2010   Fibromyalgia 2009   GERD (gastroesophageal reflux disease) 09/26/2013   Headache(784.0) 10/08/2010   Migraines   Hip pain, bilateral 11/06/2010   Hyperglycemia 08/11/2015   normal Hgb A 1C per patient   Hypertension    Hypothyroidism 10/08/2010   Low back pain radiating to right leg 03/11/2015   Other and unspecified hyperlipidemia 07/29/2012   Peripheral neuropathy 11/11/2012   burning sensation mainly right leg   Poor concentration 01/11/2011    Preventative health care 07/29/2012   S/P laparoscopic hysterectomy 10/07/2015   Sinusitis, acute 07/27/2011   Tachycardia    Thyroid disease 10/08/2010   Tick bite of flank 11/09/2011   Tinea corporis 11/11/2012   Uterine leiomyoma     Family History  Problem Relation Age of Onset   Fibromyalgia Mother    Diabetes Mother        type 2   Hypertension Mother    Hyperlipidemia Mother    Arthritis Mother    Alcohol abuse Father    Colon cancer Father    Hypertension Brother    Alcohol abuse Brother    Heart disease Maternal Grandmother    Hyperlipidemia Maternal Grandmother    Diabetes Maternal Grandmother        type 2   Colon cancer Maternal Grandmother    Stroke Maternal Grandfather    Diabetes Maternal Grandfather        type 2  Ovarian cancer Paternal Grandmother    Alzheimer's disease Paternal Grandfather     Social History   Socioeconomic History   Marital status: Married    Spouse name: Derrick   Number of children: Not on file   Years of education: Not on file   Highest education level: Not on file  Occupational History   Not on file  Tobacco Use   Smoking status: Never   Smokeless tobacco: Never  Substance and Sexual Activity   Alcohol use: Yes    Comment: special occassion- very rarely   Drug use: No   Sexual activity: Yes    Partners: Male    Birth control/protection: None  Other Topics Concern   Not on file  Social History Narrative   Not on file   Social Drivers of Health   Financial Resource Strain: Not on file  Food Insecurity: Not on file  Transportation Needs: Not on file  Physical Activity: Not on file  Stress: Not on file  Social Connections: Not on file  Intimate Partner Violence: Not on file    Past Medical History, Surgical history, Social history, and Family history were reviewed and updated as appropriate.   Please see review of systems for further details on the patient's review from today.   Objective:   Physical  Exam:  LMP 09/02/2015 (Approximate) Comment: continous bleeding  Physical Exam Constitutional:      General: She is not in acute distress. Musculoskeletal:        General: No deformity.  Neurological:     Mental Status: She is alert and oriented to person, place, and time.     Coordination: Coordination normal.  Psychiatric:        Attention and Perception: Attention and perception normal. She does not perceive auditory or visual hallucinations.        Mood and Affect: Affect is not labile, blunt, angry or inappropriate.        Speech: Speech normal.        Behavior: Behavior normal.        Thought Content: Thought content normal. Thought content is not paranoid or delusional. Thought content does not include homicidal or suicidal ideation. Thought content does not include homicidal or suicidal plan.        Cognition and Memory: Cognition and memory normal.        Judgment: Judgment normal.     Comments: Insight intact     Lab Review:     Component Value Date/Time   NA 140 02/23/2023 1623   NA 142 10/18/2022 0000   K 3.8 02/23/2023 1623   CL 101 02/23/2023 1623   CO2 32 02/23/2023 1623   GLUCOSE 90 02/23/2023 1623   BUN 6 02/23/2023 1623   BUN 5 (L) 10/18/2022 0000   CREATININE 0.92 02/23/2023 1623   CREATININE 0.79 09/24/2013 1544   CALCIUM 9.4 02/23/2023 1623   PROT 7.7 02/23/2023 1623   ALBUMIN 4.6 02/23/2023 1623   AST 13 02/23/2023 1623   ALT 15 02/23/2023 1623   ALKPHOS 102 02/23/2023 1623   BILITOT 0.4 02/23/2023 1623   GFRNONAA >60 01/17/2018 0128   GFRAA >60 01/17/2018 0128       Component Value Date/Time   WBC 6.4 02/23/2023 1623   RBC 4.84 02/23/2023 1623   HGB 14.8 02/23/2023 1623   HCT 44.3 02/23/2023 1623   PLT 347.0 02/23/2023 1623   MCV 91.6 02/23/2023 1623   MCH 30.4 01/17/2018 0128   MCHC 33.4 02/23/2023  1623   RDW 13.8 02/23/2023 1623   LYMPHSABS 2.6 02/23/2023 1623   MONOABS 0.4 02/23/2023 1623   EOSABS 0.1 02/23/2023 1623   BASOSABS  0.0 02/23/2023 1623    No results found for: "POCLITH", "LITHIUM"   No results found for: "PHENYTOIN", "PHENOBARB", "VALPROATE", "CBMZ"   .res Assessment: Plan:    Plan:  PDMP reviewed  1. Buspar 5mg  TID 2. Effexor XR 150mg  daily   3. Xanax 1mg  BID  Therapist - [name]  RTC 3 months  30 minutes spent dedicated to the care of this patient on the date of this encounter to include pre-visit review of records, ordering of medication, post visit documentation, and face-to-face time with the patient discussing depression, anxiety, insomnia and panic attacks. Discussed current medications to treat symptoms.  Patient advised to contact office with any questions, adverse effects, or acute worsening in signs and symptoms.  Discussed potential benefits, risk, and side effects of benzodiazepines to include potential risk of tolerance and dependence, as well as possible drowsiness.  Advised patient not to drive if experiencing drowsiness and to take lowest possible effective dose to minimize risk of dependence and tolerance.  Discussed potential metabolic side effects associated with atypical antipsychotics, as well as potential risk for movement side effects. Advised pt to contact office if movement side effects occur.     Diagnoses and all orders for this visit:  Major depressive disorder, recurrent episode, moderate (HCC)  Generalized anxiety disorder -     busPIRone (BUSPAR) 5 MG tablet; TAKE 1 TABLET(5 MG) BY MOUTH THREE TIMES DAILY -     venlafaxine XR (EFFEXOR-XR) 150 MG 24 hr capsule; Take 1 capsule (150 mg total) by mouth every morning.  Panic attacks  Insomnia, unspecified type     Please see After Visit Summary for patient specific instructions.  Future Appointments  Date Time Provider Department Center  04/12/2023 10:20 AM Bradd Canary, MD [facility]  04/14/2023  2:45 PM Judi Saa, DO [facility] None  07/28/2023 10:40 AM Bradd Canary, MD [facility]     No orders of the defined types were placed in this encounter.     -------------------------------

## 2023-03-07 ENCOUNTER — Telehealth: Payer: Self-pay

## 2023-03-07 NOTE — Telephone Encounter (Signed)
Patient has been scheduled for screening colonoscopy at [facility] on Tuesday, 05/24/23 at 10:15 am (arrival time: 8:45 am). Telephone PV scheduled for Tuesday, 05/10/23 at 11 am.   Lm on vm for patient to return call.

## 2023-03-07 NOTE — Telephone Encounter (Signed)
-----   Message from Nurse [place] sent at 02/09/2023 10:18 AM EST ----- Regarding: April hospital date? Schedule screening colonoscopy at hospital, check Dr. Myrtie Neither April date

## 2023-03-09 ENCOUNTER — Other Ambulatory Visit: Payer: Self-pay | Admitting: Family Medicine

## 2023-03-09 NOTE — Telephone Encounter (Signed)
2nd attempt to reach patient - Lm on vm for patient to return call.

## 2023-03-10 NOTE — Telephone Encounter (Signed)
 Patient returned call, confirmed both appointments.

## 2023-03-10 NOTE — Telephone Encounter (Signed)
 Noted, thank you

## 2023-03-14 ENCOUNTER — Other Ambulatory Visit: Payer: Self-pay | Admitting: Family Medicine

## 2023-03-14 DIAGNOSIS — N6320 Unspecified lump in the left breast, unspecified quadrant: Secondary | ICD-10-CM

## 2023-03-26 ENCOUNTER — Other Ambulatory Visit: Payer: Self-pay | Admitting: Family Medicine

## 2023-03-28 MED ORDER — HYDROCODONE-ACETAMINOPHEN 5-325 MG PO TABS: 1.0000 | ORAL_TABLET | Freq: Four times a day (QID) | ORAL | 0 refills | Status: DC | PRN

## 2023-04-01 ENCOUNTER — Other Ambulatory Visit: Payer: 59

## 2023-04-08 ENCOUNTER — Other Ambulatory Visit: Payer: Self-pay | Admitting: Family Medicine

## 2023-04-10 ENCOUNTER — Other Ambulatory Visit: Payer: Self-pay | Admitting: Family Medicine

## 2023-04-11 NOTE — Assessment & Plan Note (Signed)
 hgba1c acceptable, minimize simple carbs. Increase exercise as tolerated. Continue current meds

## 2023-04-11 NOTE — Assessment & Plan Note (Signed)
 Well controlled, no changes to meds. Encouraged heart healthy diet such as the DASH diet and exercise as tolerated. Tolerating Atorvastatin

## 2023-04-11 NOTE — Assessment & Plan Note (Signed)
 Well controlled, no changes to meds. Encouraged heart healthy diet such as the DASH diet and exercise as tolerated.

## 2023-04-11 NOTE — Assessment & Plan Note (Signed)
 On Levothyroxine, continue to monitor

## 2023-04-12 ENCOUNTER — Encounter: Payer: Self-pay | Admitting: Family Medicine

## 2023-04-12 ENCOUNTER — Telehealth (INDEPENDENT_AMBULATORY_CARE_PROVIDER_SITE_OTHER): Payer: 59 | Admitting: Family Medicine

## 2023-04-12 VITALS — BP 142/98 | HR 98 | Ht 59.0 in | Wt 159.4 lb

## 2023-04-12 DIAGNOSIS — E039 Hypothyroidism, unspecified: Secondary | ICD-10-CM | POA: Diagnosis not present

## 2023-04-12 DIAGNOSIS — E079 Disorder of thyroid, unspecified: Secondary | ICD-10-CM

## 2023-04-12 DIAGNOSIS — E1169 Type 2 diabetes mellitus with other specified complication: Secondary | ICD-10-CM | POA: Diagnosis not present

## 2023-04-12 DIAGNOSIS — E782 Mixed hyperlipidemia: Secondary | ICD-10-CM | POA: Diagnosis not present

## 2023-04-12 DIAGNOSIS — I10 Essential (primary) hypertension: Secondary | ICD-10-CM | POA: Diagnosis not present

## 2023-04-12 DIAGNOSIS — Z7985 Long-term (current) use of injectable non-insulin antidiabetic drugs: Secondary | ICD-10-CM

## 2023-04-12 DIAGNOSIS — E785 Hyperlipidemia, unspecified: Secondary | ICD-10-CM

## 2023-04-12 MED ORDER — HYDROCODONE-ACETAMINOPHEN 5-325 MG PO TABS: 1.0000 | ORAL_TABLET | Freq: Four times a day (QID) | ORAL | 0 refills | Status: DC | PRN

## 2023-04-12 MED ORDER — TRULICITY 1.5 MG/0.5ML ~~LOC~~ SOAJ: 1.5000 mg | SUBCUTANEOUS | 3 refills | Status: DC

## 2023-04-12 MED ORDER — MELOXICAM 15 MG PO TABS: 15.0000 mg | ORAL_TABLET | Freq: Every day | ORAL | Status: DC | PRN

## 2023-04-12 NOTE — Progress Notes (Addendum)
 MyChart Video Visit    Virtual Visit via Video Note   This patient is at least at moderate risk for complications without adequate follow up. This format is felt to be most appropriate for this patient at this time. Physical exam was limited by quality of the video and audio technology used for the visit. [name], [name] was able to get the patient set up on a video visit.  Patient location: home Patient and provider in visit Provider location: Office  I discussed the limitations of evaluation and management by telemedicine and the availability of in person appointments. The patient expressed understanding and agreed to proceed.  Visit Date: 04/12/2023  Today's healthcare provider: Danise Edge, MD  Subjective:    Patient ID: Lauren Hall, female    DOB: 1970-10-08, 53 y.o.   MRN: [id number]  Chief Complaint  Patient presents with   Follow-up    HPI Discussed the use of AI scribe software for clinical note transcription with the patient, who gave verbal consent to proceed.  History of Present Illness The patient presents with neck pain following an epidural injection. She is accompanied by her husband, Lauren Hall, who is on disability. She was referred by Dr. Alvester Morin for evaluation of persistent neck pain after an epidural injection.  The neck pain began following an epidural injection administered by Dr. Alvester Morin, a neurologist, towards the end of 2024. The injection was intended to restore sensation in her fingers, which it successfully did, but it exacerbated her neck pain. The neck pain has persisted for several months since the injection.  A week ago, she experienced a fall due to clumsiness, resulting in back and hip pain. The fall was sudden and shocking, with pain manifesting more the following day. She attributes some of her pain to stressors, including her husband's recent falls and his feelings of inadequacy due to being on disability.  Her husband, Lauren Hall, has fallen three  times in 24 hours, injuring his knee and toe. His leg frequently gives out, causing him to fall. He is on disability and has been driving for [employer] to contribute financially, which has improved his mental health. She made him rest at home for a few days after his falls to recover. She reports feeling stressed and tense, often gritting her teeth. She notes that her husband's condition and the associated stress contribute to her pain. Lauren Hall is managing his diabetes with a monitor and pump, and his blood sugar levels have been stable.  She is currently taking Buspar 5 mg three times a day for anxiety, which is helping. She also takes Celebrex twice a day, but she is unsure if it provides relief. She uses tizanidine as needed for muscle relaxation, preferring it over Flexeril due to less grogginess. Gabapentin is taken one in the morning, one midday, and two at night. She has hydrocodone for pain, which she uses sparingly, and is on Trulicity 0.75 mg for blood sugar control, which she feels is effective. Other medications include levothyroxine, lisinopril, omeprazole, Zofran, venlafaxine, atorvastatin, amlodipine, and Xanax as needed.    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   Complication of anesthesia    patient woke up while they were sewing up her incision with breast lumpectomy   Depression    Diabetes (HCC)    Dysrhythmia    Tachycardia   Enlarged thyroid  Fatigue 10/08/2010   Fibromyalgia 2009   GERD (gastroesophageal reflux disease) 09/26/2013   Headache(784.0) 10/08/2010   Migraines   Hip pain, bilateral 11/06/2010   Hyperglycemia 08/11/2015   normal Hgb A 1C per patient   Hypertension    Hypothyroidism 10/08/2010   Low back pain radiating to right leg 03/11/2015   Other and unspecified hyperlipidemia 07/29/2012   Peripheral neuropathy 11/11/2012    burning sensation mainly right leg   Poor concentration 01/11/2011   Preventative health care 07/29/2012   S/P laparoscopic hysterectomy 10/07/2015   Sinusitis, acute 07/27/2011   Tachycardia    Thyroid disease 10/08/2010   Tick bite of flank 11/09/2011   Tinea corporis 11/11/2012   Uterine leiomyoma     Past Surgical History:  Procedure Laterality Date   BREAST LUMPECTOMY Right 1999   benign    CYSTOSCOPY N/A 10/07/2015   Procedure: CYSTOSCOPY;  Surgeon: Essie Hart, MD;  Location: [facility];  Service: Gynecology;  Laterality: N/A;    Family History  Problem Relation Age of Onset   Fibromyalgia Mother    Diabetes Mother        type 2   Hypertension Mother    Hyperlipidemia Mother    Arthritis Mother    Alcohol abuse Father    Colon cancer Father    Hypertension Brother    Alcohol abuse Brother    Heart disease Maternal Grandmother    Hyperlipidemia Maternal Grandmother    Diabetes Maternal Grandmother        type 2   Colon cancer Maternal Grandmother    Stroke Maternal Grandfather    Diabetes Maternal Grandfather        type 2   Ovarian cancer Paternal Grandmother    Alzheimer's disease Paternal Grandfather     Social History   Socioeconomic History   Marital status: Married    Spouse name: Derrick   Number of children: Not on file   Years of education: Not on file   Highest education level: Not on file  Occupational History   Not on file  Tobacco Use   Smoking status: Never   Smokeless tobacco: Never  Substance and Sexual Activity   Alcohol use: Yes    Comment: special occassion- very rarely   Drug use: No   Sexual activity: Yes    Partners: Male    Birth control/protection: None  Other Topics Concern   Not on file  Social History Narrative   Not on file   Social Drivers of Health   Financial Resource Strain: Not on file  Food Insecurity: Not on file  Transportation Needs: Not on file  Physical Activity: Not on file  Stress: Not on file  Social  Connections: Not on file  Intimate Partner Violence: Not on file    Outpatient Medications Prior to Visit  Medication Sig Dispense Refill   ALPRAZolam (XANAX) 1 MG tablet TAKE 1 TABLET BY MOUTH TWICE A DAY 60 tablet 2   amLODipine (NORVASC) 5 MG tablet Take 1 tablet (5 mg total) by mouth daily. 90 tablet 3   atorvastatin (LIPITOR) 10 MG tablet Take 0.5 tablets (5 mg total) by mouth daily. 15 tablet 5   Blood Glucose Monitoring Suppl DEVI 1 each by Does not apply route in the morning, at noon, and at bedtime. May substitute to any manufacturer covered by patient's insurance. 1 each 0   busPIRone (BUSPAR) 5 MG tablet TAKE 1 TABLET(5 MG) BY MOUTH THREE TIMES DAILY 270 tablet 3  celecoxib (CELEBREX) 200 MG capsule Take 1 capsule (200 mg total) by mouth 2 (two) times daily. 60 capsule 3   diazepam (VALIUM) 5 MG tablet Take one tablet by mouth with food one hour prior to procedure. May repeat 30 minutes prior if needed. 2 tablet 0   Dulaglutide (TRULICITY) 0.75 MG/0.5ML SOAJ INJECT 0.75 MG SUBCUTANEOUSLY ONE TIME PER WEEK 2 mL 0   gabapentin (NEURONTIN) 300 MG capsule TAKE 1 CAPSULE BY MOUTH TWICE A DAY AND TAKE 2 CAPSULES BY MOUTH AT BEDTIME 120 capsule 1   Glucose Blood (BLOOD GLUCOSE TEST STRIPS) STRP Check blood sugars 3 times daily 300 each 12   HYDROcodone-acetaminophen (NORCO/VICODIN) 5-325 MG tablet Take 1 tablet by mouth every 6 (six) hours as needed for moderate pain (pain score 4-6). 30 tablet 0   Lancets (ONETOUCH DELICA PLUS LANCET30G) MISC Check blood sugars 3 times daily 300 each 12   levothyroxine (SYNTHROID) 25 MCG tablet TAKE 1 TABLET BY MOUTH DAILY BEFORE BREAKFAST. 90 tablet 1   lisinopril (ZESTRIL) 10 MG tablet TAKE 1 TABLET (10 MG TOTAL) BY MOUTH IN THE MORNING AND AT BEDTIME 60 tablet 2   LOW-DOSE ASPIRIN PO      omeprazole (PRILOSEC) 40 MG capsule Take 1 capsule (40 mg total) by mouth daily. 90 capsule 0   ondansetron (ZOFRAN) 4 MG tablet TAKE 1 TABLET BY MOUTH EVERY 8 HOURS  AS NEEDED FOR NAUSEA AND VOMITING 18 tablet 3   tiZANidine (ZANAFLEX) 2 MG tablet Take 0.5-2 tablets (1-4 mg total) by mouth every 8 (eight) hours as needed for muscle spasms. 60 tablet 3   venlafaxine XR (EFFEXOR-XR) 150 MG 24 hr capsule Take 1 capsule (150 mg total) by mouth every morning. 90 capsule 3   No facility-administered medications prior to visit.    Allergies  Allergen Reactions   Promethazine Anaphylaxis   Promethazine Hcl Anaphylaxis    Review of Systems  Constitutional:  Positive for malaise/fatigue. Negative for fever.  HENT:  Negative for congestion.   Eyes:  Negative for blurred vision.  Respiratory:  Negative for shortness of breath.   Cardiovascular:  Negative for chest pain, palpitations and leg swelling.  Gastrointestinal:  Negative for abdominal pain, blood in stool and nausea.  Genitourinary:  Negative for dysuria and frequency.  Musculoskeletal:  Positive for back pain, joint pain, myalgias and neck pain. Negative for falls.  Skin:  Negative for rash.  Neurological:  Negative for dizziness, loss of consciousness and headaches.  Endo/Heme/Allergies:  Negative for environmental allergies.  Psychiatric/Behavioral:  Negative for depression. The patient is nervous/anxious.        Objective:    Physical Exam  BP (!) 142/98 Comment: Pt obtained  Pulse 98 Comment: Pt obtained  Ht 4\' 11"  (1.499 m) Comment: Pt stated  Wt 159 lb 6.4 oz (72.3 kg) Comment: Pt stated  LMP 09/02/2015 (Approximate) Comment: continous bleeding  BMI 32.19 kg/m  Wt Readings from Last 3 Encounters:  04/12/23 159 lb 6.4 oz (72.3 kg)  02/24/23 158 lb 12.8 oz (72 kg)  02/15/23 162 lb (73.5 kg)    Diabetic Foot Exam - Simple   No data filed    Lab Results  Component Value Date   WBC 6.4 02/23/2023   HGB 14.8 02/23/2023   HCT 44.3 02/23/2023   PLT 347.0 02/23/2023   GLUCOSE 90 02/23/2023   CHOL 178 02/23/2023   TRIG 83.0 02/23/2023   HDL 80.50 02/23/2023   LDLDIRECT 146.0  09/19/2020   LDLCALC 81  02/23/2023   ALT 15 02/23/2023   AST 13 02/23/2023   NA 140 02/23/2023   K 3.8 02/23/2023   CL 101 02/23/2023   CREATININE 0.92 02/23/2023   BUN 6 02/23/2023   CO2 32 02/23/2023   TSH 0.17 (L) 02/23/2023   HGBA1C 6.1 02/23/2023    Lab Results  Component Value Date   TSH 0.17 (L) 02/23/2023   Lab Results  Component Value Date   WBC 6.4 02/23/2023   HGB 14.8 02/23/2023   HCT 44.3 02/23/2023   MCV 91.6 02/23/2023   PLT 347.0 02/23/2023   Lab Results  Component Value Date   NA 140 02/23/2023   K 3.8 02/23/2023   CO2 32 02/23/2023   GLUCOSE 90 02/23/2023   BUN 6 02/23/2023   CREATININE 0.92 02/23/2023   BILITOT 0.4 02/23/2023   ALKPHOS 102 02/23/2023   AST 13 02/23/2023   ALT 15 02/23/2023   PROT 7.7 02/23/2023   ALBUMIN 4.6 02/23/2023   CALCIUM 9.4 02/23/2023   ANIONGAP 12 01/17/2018   EGFR 89 10/18/2022   GFR 70.72 02/23/2023   Lab Results  Component Value Date   CHOL 178 02/23/2023   Lab Results  Component Value Date   HDL 80.50 02/23/2023   Lab Results  Component Value Date   LDLCALC 81 02/23/2023   Lab Results  Component Value Date   TRIG 83.0 02/23/2023   Lab Results  Component Value Date   CHOLHDL 2 02/23/2023   Lab Results  Component Value Date   HGBA1C 6.1 02/23/2023       Assessment & Plan:  Mixed hyperlipidemia Assessment & Plan: Well controlled, no changes to meds. Encouraged heart healthy diet such as the DASH diet and exercise as tolerated. Tolerating Atorvastatin   Primary hypertension Assessment & Plan: Well controlled, no changes to meds. Encouraged heart healthy diet such as the DASH diet and exercise as tolerated.     Thyroid disease Assessment & Plan: On Levothyroxine, continue to monitor    Type 2 diabetes mellitus with hyperlipidemia (HCC) Assessment & Plan: hgba1c acceptable, minimize simple carbs. Increase exercise as tolerated. Continue current meds      Assessment and  Plan Assessment & Plan Neck Pain Epidural injection improved finger sensation but worsened neck pain. Persistent pain requires follow-up. - Advise follow-up with Dr. Alvester Morin regarding persistent neck pain post-epidural injection.  Chronic Pain Current management includes Celebrex, gabapentin, and hydrocodone. Considering medication adjustments for better pain control. - Switch from Celebrex to meloxicam and assess effectiveness. - Consider increasing gabapentin dosage if meloxicam does not provide relief. - Increase hydrocodone prescription to 60 tablets to allow for two per day as needed for severe pain. - Monitor for any adverse effects from medication changes.  Diabetes Mellitus Blood sugar levels stable. Interested in increasing Trulicity for weight loss. - Increase Trulicity dose to 1.5 mg to assist with weight loss and monitor blood sugar levels for any adverse effects. - Advise to report any side effects such as upset stomach or hypoglycemia.  Hypothyroidism TSH slightly low, T4 normal. Recheck planned for stability. - Schedule TSH and free T4 lab tests towards the end of April.  Anxiety Buspar effective at current dose. Prefers no change. - Continue Buspar at current dose of 5 mg three times daily.  General Health Maintenance Discussed fall prevention and nutrition for her and her husband. - Encourage staying focused on current activities to prevent falls. - Advise husband to ensure adequate protein intake and hydration to prevent  weakness and falls.  Follow-up Monitoring medication effectiveness and scheduling lab tests. Cautious approach to new medications. - Arrange for lab tests (TSH and free T4) after the colonoscopy in April. - Schedule a follow-up visit in the summer. - Advise to contact if medication adjustments are needed before the scheduled follow-up. - Consider consultation with a pain specialist if interested in new pain management options.     Danise Edge, MD

## 2023-04-13 ENCOUNTER — Encounter: Payer: Self-pay | Admitting: Family Medicine

## 2023-04-13 NOTE — Progress Notes (Deleted)
 [name] [address] [place] [address] Phone: [phone] Subjective:    I'm seeing this patient by the request  of:  Bradd Canary, MD  CC:   [id number]  Lauren Hall is a 53 y.o. female coming in with complaint of back and neck pain. OMT on 01/13/2023. Patient states   Medications patient has been prescribed:   Taking:         Reviewed prior external information including notes and imaging from previsou exam, outside providers and external EMR if available.   As well as notes that were available from care everywhere and other healthcare systems.  Past medical history, social, surgical and family history all reviewed in electronic medical record.  No pertanent information unless stated regarding to the chief complaint.   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   Complication of anesthesia    patient woke up while they were sewing up her incision with breast lumpectomy   Depression    Diabetes (HCC)    Dysrhythmia    Tachycardia   Enlarged thyroid    Fatigue 10/08/2010   Fibromyalgia 2009   GERD (gastroesophageal reflux disease) 09/26/2013   Headache(784.0) 10/08/2010   Migraines   Hip pain, bilateral 11/06/2010   Hyperglycemia 08/11/2015   normal Hgb A 1C per patient   Hypertension    Hypothyroidism 10/08/2010   Low back pain radiating to right leg 03/11/2015   Other and unspecified hyperlipidemia 07/29/2012   Peripheral neuropathy 11/11/2012   burning sensation mainly right leg   Poor concentration 01/11/2011   Preventative health care 07/29/2012   S/P laparoscopic hysterectomy 10/07/2015   Sinusitis, acute 07/27/2011   Tachycardia    Thyroid disease 10/08/2010   Tick bite of flank 11/09/2011   Tinea corporis 11/11/2012   Uterine leiomyoma     Allergies  Allergen  Reactions   Promethazine Anaphylaxis   Promethazine Hcl Anaphylaxis     Review of Systems:  No headache, visual changes, nausea, vomiting, diarrhea, constipation, dizziness, abdominal pain, skin rash, fevers, chills, night sweats, weight loss, swollen lymph nodes, body aches, joint swelling, chest pain, shortness of breath, mood changes. POSITIVE muscle aches  Objective  Last menstrual period 09/02/2015.   General: No apparent distress alert and oriented x3 mood and affect normal, dressed appropriately.  HEENT: Pupils equal, extraocular movements intact  Respiratory: Patient's speak in full sentences and does not appear short of breath  Cardiovascular: No lower extremity edema, non tender, no erythema  Gait MSK:  Back   Osteopathic findings  C2 flexed rotated and side bent right C6 flexed rotated and side bent left T3 extended rotated and side bent right inhaled rib T9 extended rotated and side bent left L2 flexed rotated and side bent right Sacrum right on right       Assessment and Plan:  No problem-specific Assessment & Plan notes found for this encounter.    Nonallopathic problems  Decision today to treat with OMT was based on Physical Exam  After verbal consent patient was treated with HVLA, ME, FPR techniques in cervical, rib, thoracic, lumbar, and sacral  areas  Patient tolerated the procedure well with improvement in symptoms  Patient given exercises, stretches and lifestyle modifications  See medications in patient instructions if given  Patient will follow up in 4-8 weeks  Note: This dictation was prepared with Dragon dictation along with smaller phrase technology. Any transcriptional errors that result from this process are unintentional.

## 2023-04-14 ENCOUNTER — Ambulatory Visit: Payer: 59 | Admitting: Family Medicine

## 2023-04-18 ENCOUNTER — Encounter (HOSPITAL_COMMUNITY): Admission: RE | Payer: Self-pay | Source: Home / Self Care

## 2023-04-18 ENCOUNTER — Ambulatory Visit (HOSPITAL_COMMUNITY): Admission: RE | Admit: 2023-04-18 | Payer: 59 | Source: Home / Self Care | Admitting: Gastroenterology

## 2023-04-18 SURGERY — COLONOSCOPY WITH PROPOFOL
Anesthesia: Monitor Anesthesia Care

## 2023-04-19 NOTE — Progress Notes (Unsigned)
 [facility] [address] [place] [address] Phone: [phone] Subjective:    I'm seeing this patient by the request  of:  Bradd Canary, MD  CC:   [id number]  Lauren Hall is a 53 y.o. female coming in with complaint of back and neck pain. OMT on 01/13/2023. Patient states   Medications patient has been prescribed:   Taking:         Reviewed prior external information including notes and imaging from previsou exam, outside providers and external EMR if available.   As well as notes that were available from care everywhere and other healthcare systems.  Past medical history, social, surgical and family history all reviewed in electronic medical record.  No pertanent information unless stated regarding to the chief complaint.   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   Complication of anesthesia    patient woke up while they were sewing up her incision with breast lumpectomy   Depression    Diabetes (HCC)    Dysrhythmia    Tachycardia   Enlarged thyroid    Fatigue 10/08/2010   Fibromyalgia 2009   GERD (gastroesophageal reflux disease) 09/26/2013   Headache(784.0) 10/08/2010   Migraines   Hip pain, bilateral 11/06/2010   Hyperglycemia 08/11/2015   normal Hgb A 1C per patient   Hypertension    Hypothyroidism 10/08/2010   Low back pain radiating to right leg 03/11/2015   Other and unspecified hyperlipidemia 07/29/2012   Peripheral neuropathy 11/11/2012   burning sensation mainly right leg   Poor concentration 01/11/2011   Preventative health care 07/29/2012   S/P laparoscopic hysterectomy 10/07/2015   Sinusitis, acute 07/27/2011   Tachycardia    Thyroid disease 10/08/2010   Tick bite of flank 11/09/2011   Tinea corporis 11/11/2012   Uterine leiomyoma     Allergies  Allergen  Reactions   Promethazine Anaphylaxis   Promethazine Hcl Anaphylaxis     Review of Systems:  No headache, visual changes, nausea, vomiting, diarrhea, constipation, dizziness, abdominal pain, skin rash, fevers, chills, night sweats, weight loss, swollen lymph nodes, body aches, joint swelling, chest pain, shortness of breath, mood changes. POSITIVE muscle aches  Objective  Last menstrual period 09/02/2015.   General: No apparent distress alert and oriented x3 mood and affect normal, dressed appropriately.  HEENT: Pupils equal, extraocular movements intact  Respiratory: Patient's speak in full sentences and does not appear short of breath  Cardiovascular: No lower extremity edema, non tender, no erythema  Gait MSK:  Back   Osteopathic findings  C2 flexed rotated and side bent right C6 flexed rotated and side bent left T3 extended rotated and side bent right inhaled rib T9 extended rotated and side bent left L2 flexed rotated and side bent right Sacrum right on right       Assessment and Plan:  No problem-specific Assessment & Plan notes found for this encounter.    Nonallopathic problems  Decision today to treat with OMT was based on Physical Exam  After verbal consent patient was treated with HVLA, ME, FPR techniques in cervical, rib, thoracic, lumbar, and sacral  areas  Patient tolerated the procedure well with improvement in symptoms  Patient given exercises, stretches and lifestyle modifications  See medications in patient instructions if given  Patient will follow up in 4-8 weeks  Note: This dictation was prepared with Dragon dictation along with smaller phrase technology. Any transcriptional errors that result from this process are unintentional.

## 2023-04-20 ENCOUNTER — Ambulatory Visit (INDEPENDENT_AMBULATORY_CARE_PROVIDER_SITE_OTHER): Admitting: Family Medicine

## 2023-04-20 ENCOUNTER — Encounter: Payer: Self-pay | Admitting: Family Medicine

## 2023-04-20 VITALS — BP 132/82 | HR 78 | Ht 59.0 in

## 2023-04-20 DIAGNOSIS — M9904 Segmental and somatic dysfunction of sacral region: Secondary | ICD-10-CM | POA: Diagnosis not present

## 2023-04-20 DIAGNOSIS — M9902 Segmental and somatic dysfunction of thoracic region: Secondary | ICD-10-CM | POA: Diagnosis not present

## 2023-04-20 DIAGNOSIS — M9901 Segmental and somatic dysfunction of cervical region: Secondary | ICD-10-CM | POA: Diagnosis not present

## 2023-04-20 DIAGNOSIS — M9908 Segmental and somatic dysfunction of rib cage: Secondary | ICD-10-CM | POA: Diagnosis not present

## 2023-04-20 DIAGNOSIS — M9903 Segmental and somatic dysfunction of lumbar region: Secondary | ICD-10-CM | POA: Diagnosis not present

## 2023-04-20 DIAGNOSIS — M461 Sacroiliitis, not elsewhere classified: Secondary | ICD-10-CM | POA: Diagnosis not present

## 2023-04-20 NOTE — Patient Instructions (Addendum)
 Follow up with Dr. Alvester Morin    See you again in 7-8 weeks

## 2023-04-20 NOTE — Assessment & Plan Note (Signed)
 Chronic problem with exacerbation.  Attempted osteopathic manipulation.  Did have more tightness noted in the neck as well.  Responded well to osteopathic manipulation.  Discussed which activities to do and which ones to avoid.  Follow-up again in 6 to 8 weeks.

## 2023-05-03 ENCOUNTER — Ambulatory Visit
Admission: RE | Admit: 2023-05-03 | Discharge: 2023-05-03 | Disposition: A | Discharge status: Home / Self Care | Source: Ambulatory Visit | Attending: Family Medicine | Admitting: Family Medicine

## 2023-05-03 ENCOUNTER — Ambulatory Visit
Admission: RE | Admit: 2023-05-03 | Discharge: 2023-05-03 | Disposition: A | Discharge status: Home / Self Care | Payer: 59 | Source: Ambulatory Visit | Attending: Family Medicine | Admitting: Family Medicine

## 2023-05-03 ENCOUNTER — Other Ambulatory Visit: Payer: Self-pay | Admitting: Family Medicine

## 2023-05-03 DIAGNOSIS — N6315 Unspecified lump in the right breast, overlapping quadrants: Secondary | ICD-10-CM | POA: Diagnosis not present

## 2023-05-03 DIAGNOSIS — N6002 Solitary cyst of left breast: Secondary | ICD-10-CM | POA: Diagnosis not present

## 2023-05-03 DIAGNOSIS — N6320 Unspecified lump in the left breast, unspecified quadrant: Secondary | ICD-10-CM

## 2023-05-03 DIAGNOSIS — N6001 Solitary cyst of right breast: Secondary | ICD-10-CM | POA: Diagnosis not present

## 2023-05-03 DIAGNOSIS — N6321 Unspecified lump in the left breast, upper outer quadrant: Secondary | ICD-10-CM | POA: Diagnosis not present

## 2023-05-04 DIAGNOSIS — F332 Major depressive disorder, recurrent severe without psychotic features: Secondary | ICD-10-CM | POA: Diagnosis not present

## 2023-05-10 ENCOUNTER — Telehealth: Payer: Self-pay

## 2023-05-10 ENCOUNTER — Ambulatory Visit (AMBULATORY_SURGERY_CENTER): Payer: 59

## 2023-05-10 VITALS — Ht 59.0 in | Wt 153.0 lb

## 2023-05-10 DIAGNOSIS — Z800 Family history of malignant neoplasm of digestive organs: Secondary | ICD-10-CM

## 2023-05-10 DIAGNOSIS — Z1211 Encounter for screening for malignant neoplasm of colon: Secondary | ICD-10-CM

## 2023-05-10 NOTE — Telephone Encounter (Signed)
 Patient answered and completed pre visit

## 2023-05-10 NOTE — Progress Notes (Signed)

## 2023-05-17 ENCOUNTER — Other Ambulatory Visit: Payer: Self-pay

## 2023-05-17 ENCOUNTER — Encounter (HOSPITAL_COMMUNITY): Payer: Self-pay | Admitting: Gastroenterology

## 2023-05-17 ENCOUNTER — Telehealth: Payer: Self-pay

## 2023-05-17 NOTE — Progress Notes (Addendum)
 PCP - Harwood Lingo, MD Cardiologist - Dr Carson Clara, MD LOV 02-15-23 epic  PPM/ICD -  Device Orders -  Rep Notified -   Chest x-ray -  EKG - 10-18-22 epic Stress Test -  ECHO - 2021 epic Cardiac Cath -  Coronary CT 11-09-22 epic  Sleep Study -  CPAP -   Fasting Blood Sugar - 80-91 Checks Blood Sugar _1-2____ times a day  Blood Thinner Instructions: Aspirin Instructions: asa 81 mg   TRULICITY- hold one week last dose-05-10-23  Activity--Able to climb a flight of stairs without CP or SOB  Anesthesia review: HTN, DM,Arnold-Chiari syndrome   Patient denies shortness of breath, fever, cough and chest pain at PAT appointment   All instructions explained to the patient, with a verbal understanding of the material. Patient agrees to go over the instructions while at home for a better understanding. Patient also instructed to self quarantine after being tested for COVID-19. The opportunity to ask questions was provided.

## 2023-05-17 NOTE — Telephone Encounter (Signed)
 Procedure:colon/endo Procedure date: 05/24/23 Procedure location: [facility] Arrival Time: 8:45 am Spoke with the patient Y/N: Y Any prep concerns? N  Has the patient obtained the prep from the pharmacy ? Y Do you have a care partner and transportation: Y Any additional concerns? N

## 2023-05-19 ENCOUNTER — Other Ambulatory Visit: Payer: Self-pay | Admitting: Family Medicine

## 2023-05-23 ENCOUNTER — Encounter: Payer: Self-pay | Admitting: Gastroenterology

## 2023-05-23 MED ORDER — HYDROCODONE-ACETAMINOPHEN 5-325 MG PO TABS: 1.0000 | ORAL_TABLET | Freq: Four times a day (QID) | ORAL | 0 refills | Status: DC | PRN

## 2023-05-23 NOTE — Anesthesia Preprocedure Evaluation (Signed)
 Anesthesia Evaluation  Patient identified by MRN, date of birth, ID band Patient awake    Reviewed: Allergy  & Precautions, NPO status , Patient's Chart, lab work & pertinent test results  History of Anesthesia Complications (+) history of anesthetic complications  Airway Mallampati: III  TM Distance: >3 FB Neck ROM: Full    Dental no notable dental hx.    Pulmonary    Pulmonary exam normal breath sounds clear to auscultation       Cardiovascular hypertension, (-) angina (-) Past MI Normal cardiovascular exam Rhythm:Regular Rate:Normal     Neuro/Psych  Headaches PSYCHIATRIC DISORDERS Anxiety Depression     Neuromuscular disease (Chairi Malformation)    GI/Hepatic   Endo/Other  diabetes, Type 2  On Dulaglutide  Renal/GU      Musculoskeletal  (+) Arthritis ,    Abdominal   Peds  Hematology   Anesthesia Other Findings All: Promethazine  Reproductive/Obstetrics                             Anesthesia Physical Anesthesia Plan  ASA: 2  Anesthesia Plan: MAC   Post-op Pain Management: Minimal or no pain anticipated   Induction:   PONV Risk Score and Plan: 2 and Treatment may vary due to age or medical condition  Airway Management Planned: Natural Airway and Nasal Cannula  Additional Equipment: None  Intra-op Plan:   Post-operative Plan:   Informed Consent: I have reviewed the patients History and Physical, chart, labs and discussed the procedure including the risks, benefits and alternatives for the proposed anesthesia with the patient or authorized representative who has indicated his/her understanding and acceptance.     Dental advisory given  Plan Discussed with: CRNA  Anesthesia Plan Comments: (Sceening colonoscopy)       Anesthesia Quick Evaluation

## 2023-05-23 NOTE — Telephone Encounter (Signed)
 Requesting: hydrocodone  5/325mg  Contract: No UDS: no Last Visit: 11/04/2022 Next Visit: 07/28/2023 Last Refill: 04/12/23  Please Advise

## 2023-05-24 ENCOUNTER — Ambulatory Visit (HOSPITAL_COMMUNITY)
Admission: RE | Admit: 2023-05-24 | Discharge: 2023-05-24 | Disposition: A | Discharge status: Home / Self Care | Payer: 59 | Attending: Gastroenterology | Admitting: Gastroenterology

## 2023-05-24 ENCOUNTER — Ambulatory Visit (HOSPITAL_BASED_OUTPATIENT_CLINIC_OR_DEPARTMENT_OTHER): Admitting: Anesthesiology

## 2023-05-24 ENCOUNTER — Ambulatory Visit (HOSPITAL_COMMUNITY): Admitting: Anesthesiology

## 2023-05-24 ENCOUNTER — Encounter (HOSPITAL_COMMUNITY): Payer: Self-pay | Admitting: Gastroenterology

## 2023-05-24 ENCOUNTER — Encounter (HOSPITAL_COMMUNITY)
Admission: RE | Disposition: A | Discharge status: Home / Self Care | Payer: Self-pay | Source: Home / Self Care | Attending: Gastroenterology

## 2023-05-24 ENCOUNTER — Other Ambulatory Visit: Payer: Self-pay

## 2023-05-24 DIAGNOSIS — Z1211 Encounter for screening for malignant neoplasm of colon: Secondary | ICD-10-CM | POA: Insufficient documentation

## 2023-05-24 DIAGNOSIS — D120 Benign neoplasm of cecum: Secondary | ICD-10-CM | POA: Diagnosis not present

## 2023-05-24 DIAGNOSIS — K219 Gastro-esophageal reflux disease without esophagitis: Secondary | ICD-10-CM | POA: Diagnosis not present

## 2023-05-24 DIAGNOSIS — E119 Type 2 diabetes mellitus without complications: Secondary | ICD-10-CM | POA: Diagnosis not present

## 2023-05-24 DIAGNOSIS — Z800 Family history of malignant neoplasm of digestive organs: Secondary | ICD-10-CM | POA: Diagnosis not present

## 2023-05-24 DIAGNOSIS — D125 Benign neoplasm of sigmoid colon: Secondary | ICD-10-CM | POA: Diagnosis not present

## 2023-05-24 DIAGNOSIS — K635 Polyp of colon: Secondary | ICD-10-CM | POA: Diagnosis not present

## 2023-05-24 DIAGNOSIS — I10 Essential (primary) hypertension: Secondary | ICD-10-CM | POA: Insufficient documentation

## 2023-05-24 DIAGNOSIS — M797 Fibromyalgia: Secondary | ICD-10-CM | POA: Diagnosis not present

## 2023-05-24 HISTORY — DX: Family history of other specified conditions: Z84.89

## 2023-05-24 HISTORY — PX: COLONOSCOPY WITH PROPOFOL: SHX5780

## 2023-05-24 HISTORY — DX: Pneumonia, unspecified organism: J18.9

## 2023-05-24 HISTORY — PX: POLYPECTOMY: SHX149

## 2023-05-24 SURGERY — COLONOSCOPY WITH PROPOFOL
Anesthesia: Monitor Anesthesia Care

## 2023-05-24 MED ORDER — KETOROLAC TROMETHAMINE 30 MG/ML IJ SOLN
INTRAMUSCULAR | Status: DC | PRN
  Administered 2023-05-24: 30 mg via INTRAVENOUS

## 2023-05-24 MED ORDER — PROPOFOL 500 MG/50ML IV EMUL
INTRAVENOUS | Status: DC | PRN
  Administered 2023-05-24: 50 mg via INTRAVENOUS
  Administered 2023-05-24: 110 ug/kg/min via INTRAVENOUS

## 2023-05-24 MED ORDER — DEXMEDETOMIDINE HCL IN NACL 80 MCG/20ML IV SOLN
INTRAVENOUS | Status: DC | PRN
Start: 2023-05-24 — End: 2023-05-24
  Administered 2023-05-24: 12 ug via INTRAVENOUS

## 2023-05-24 MED ORDER — SODIUM CHLORIDE 0.9 % IV SOLN
INTRAVENOUS | Status: AC | PRN
  Administered 2023-05-24: 500 mL via INTRAMUSCULAR

## 2023-05-24 SURGICAL SUPPLY — 17 items
ELECTRODE REM PT RTRN 9FT ADLT (ELECTROSURGICAL) IMPLANT
FORCEPS BIOP RAD 4 LRG CAP 4 (CUTTING FORCEPS) IMPLANT
FORCEPS BXJMBJMB 240X2.8X (CUTTING FORCEPS) IMPLANT
INJECTOR/SNARE I SNARE (MISCELLANEOUS) IMPLANT
LUBRICANT JELLY 4.5OZ STERILE (MISCELLANEOUS) IMPLANT
MANIFOLD NEPTUNE II (INSTRUMENTS) IMPLANT
NDL SCLEROTHERAPY 25GX240 (NEEDLE) IMPLANT
NEEDLE SCLEROTHERAPY 25GX240 (NEEDLE) IMPLANT
PAD FLOOR 36X40 (MISCELLANEOUS) ×2 IMPLANT
PROBE APC STR FIRE (PROBE) IMPLANT
PROBE INJECTION GOLD 7FR (MISCELLANEOUS) IMPLANT
SNARE ROTATE MED OVAL 20MM (MISCELLANEOUS) IMPLANT
SYR 50ML LL SCALE MARK (SYRINGE) IMPLANT
TRAP SPECIMEN MUCOUS 40CC (MISCELLANEOUS) IMPLANT
TUBING ENDO SMARTCAP PENTAX (MISCELLANEOUS) IMPLANT
TUBING IRRIGATION ENDOGATOR (MISCELLANEOUS) ×2 IMPLANT
WATER STERILE IRR 1000ML POUR (IV SOLUTION) IMPLANT

## 2023-05-24 NOTE — Anesthesia Postprocedure Evaluation (Signed)
 Anesthesia Post Note  Patient: Lauren Hall  Procedure(s) Performed: COLONOSCOPY WITH PROPOFOL  POLYPECTOMY, INTESTINE     Patient location during evaluation: Endoscopy Anesthesia Type: MAC Level of consciousness: awake and alert Pain management: pain level controlled Vital Signs Assessment: post-procedure vital signs reviewed and stable Respiratory status: spontaneous breathing, nonlabored ventilation, respiratory function stable and patient connected to nasal cannula oxygen Cardiovascular status: blood pressure returned to baseline and stable Postop Assessment: no apparent nausea or vomiting Anesthetic complications: no  No notable events documented.  Last Vitals:  Vitals:   05/24/23 1150 05/24/23 1154  BP: 120/63 112/71  Pulse: 79 78  Resp: 17 20  Temp:    SpO2: 96% 96%    Last Pain:  Vitals:   05/24/23 1154  TempSrc:   PainSc: 6                  Rosalita Combe

## 2023-05-24 NOTE — Transfer of Care (Signed)
 Immediate Anesthesia Transfer of Care Note  Patient: Lauren Hall  Procedure(s) Performed: COLONOSCOPY WITH PROPOFOL  POLYPECTOMY, INTESTINE  Patient Location: PACU  Anesthesia Type:MAC  Level of Consciousness: alert  and oriented  Airway & Oxygen Therapy: Patient Spontanous Breathing and Patient connected to face mask oxygen  Post-op Assessment: Report given to RN, Post -op Vital signs reviewed and stable, and Patient moving all extremities X 4  Post vital signs: Reviewed and stable  Last Vitals:  Vitals Value Taken Time  BP 125/79 05/24/23 1134  Temp    Pulse 89 05/24/23 1134  Resp 19 05/24/23 1134  SpO2 100 % 05/24/23 1134    Last Pain:  Vitals:   05/24/23 0918  TempSrc: Temporal  PainSc: 4          Complications: No notable events documented.

## 2023-05-24 NOTE — Discharge Instructions (Signed)
 YOU HAD AN ENDOSCOPIC PROCEDURE TODAY: Refer to the procedure report and other information in the discharge instructions given to you for any specific questions about what was found during the examination. If this information does not answer your questions, please call [place] office at [phone] to clarify.   YOU SHOULD EXPECT: Some feelings of bloating in the abdomen. Passage of more gas than usual. Walking can help get rid of the air that was put into your GI tract during the procedure and reduce the bloating.   DIET: Your first meal following the procedure should be a light meal and then it is ok to progress to your normal diet. A half-sandwich or bowl of soup is an example of a good first meal. Heavy or fried foods are harder to digest and may make you feel nauseous or bloated. Drink plenty of fluids but you should avoid alcoholic beverages for 24 hours.   ACTIVITY: Your care partner should take you home directly after the procedure. You should plan to take it easy, moving slowly for the rest of the day. You can resume normal activity the day after the procedure however YOU SHOULD NOT DRIVE, use power tools, machinery or perform tasks that involve climbing or major physical exertion for 24 hours (because of the sedation medicines used during the test).   SYMPTOMS TO REPORT IMMEDIATELY: A gastroenterologist can be reached at any hour. Please call [phone]  for any of the following symptoms:  Following lower endoscopy (colonoscopy, flexible sigmoidoscopy) Excessive amounts of blood in the stool  Significant tenderness, worsening of abdominal pains  Swelling of the abdomen that is new, acute  Fever of 100 or higher   FOLLOW UP:  If any biopsies were taken you will be contacted by phone or by letter within the next 1-3 weeks. Call [phone]  if you have not heard about the biopsies in 3 weeks.  Please also call with any specific questions about appointments or follow up tests.

## 2023-05-24 NOTE — Interval H&P Note (Signed)
 History and Physical Interval Note:  05/24/2023 10:55 AM  Lauren Hall  has presented today for surgery, with the diagnosis of colon cancer screening.  The various methods of treatment have been discussed with the patient and family. After consideration of risks, benefits and other options for treatment, the patient has consented to  Procedure(s): COLONOSCOPY WITH PROPOFOL  (N/A) as a surgical intervention.  The patient's history has been reviewed, patient examined, no change in status, stable for surgery.  I have reviewed the patient's chart and labs.  Questions were answered to the patient's satisfaction.     Kerby Pearson III

## 2023-05-24 NOTE — Op Note (Signed)
 [facility] Patient Name: Lauren Hall Procedure Date: 05/24/2023 MRN: [id number] Attending MD: Roel Clarity. Dominic Friendly , MD, [id number] Date of Birth: March 05, 1970 CSN: [id number] Age: 53 Admit Type: Inpatient Procedure:                Colonoscopy Indications:              Colon cancer screening in patient at increased                            risk: Colorectal cancer in father and maternal                            grandmother; This is the patient's first colonoscopy Providers:                Ace Abu L. Dominic Friendly, MD, Ila Malay, RN, Tyrus Gallus, Technician Referring MD:             Bonita Bussing Rodrick Clapper Medicines:                Monitored Anesthesia Care Complications:            No immediate complications. Estimated Blood Loss:     Estimated blood loss was minimal. Procedure:                Pre-Anesthesia Assessment:                           - Prior to the procedure, a History and Physical                            was performed, and patient medications and                            allergies were reviewed. The patient's tolerance of                            previous anesthesia was also reviewed. The risks                            and benefits of the procedure and the sedation                            options and risks were discussed with the patient.                            All questions were answered, and informed consent                            was obtained. Prior Anticoagulants: The patient has                            taken no anticoagulant or antiplatelet agents. ASA  Grade Assessment: III - A patient with severe                            systemic disease. After reviewing the risks and                            benefits, the patient was deemed in satisfactory                            condition to undergo the procedure.                           After obtaining informed consent, the colonoscope                             was passed under direct vision. Throughout the                            procedure, the patient's blood pressure, pulse, and                            oxygen saturations were monitored continuously. The                            [id number]-HQ190L ([id number]) Olympus colonoscope was                            introduced through the anus and advanced to the the                            cecum, identified by appendiceal orifice and                            ileocecal valve. The colonoscopy was performed                            without difficulty. The patient tolerated the                            procedure well. The quality of the bowel                            preparation was excellent. The ileocecal valve,                            appendiceal orifice, and rectum were photographed. Scope In: 11:07:26 AM Scope Out: 11:28:57 AM Scope Withdrawal Time: 0 hours 19 minutes 22 seconds  Total Procedure Duration: 0 hours 21 minutes 31 seconds  Findings:      The perianal and digital rectal examinations were normal.      Repeat examination of right colon under NBI performed.      A diminutive polyp was found in the ileocecal valve. The polyp was       semi-sessile. The polyp was removed with a cold snare. Resection and  retrieval were complete.      Two semi-pedunculated polyps were found in the sigmoid colon. The polyps       were small in size. These polyps were removed with a hot snare.       Resection and retrieval were complete.      Retroflexion in the rectum was not performed due to anatomy.      The exam was otherwise without abnormality. Impression:               - One diminutive polyp at the ileocecal valve,                            removed with a cold snare. Resected and retrieved.                           - Two small polyps in the sigmoid colon, removed                            with a hot snare. Resected and retrieved.                           - The  examination was otherwise normal. Moderate Sedation:      MAC sedation used Recommendation:           - Patient has a contact number available for                            emergencies. The signs and symptoms of potential                            delayed complications were discussed with the                            patient. Return to normal activities tomorrow.                            Written discharge instructions were provided to the                            patient.                           - Resume previous diet.                           - Continue present medications.                           - Await pathology results.                           - Repeat colonoscopy is recommended for                            surveillance. The colonoscopy date will be  determined after pathology results from today's                            exam become available for review. Procedure Code(s):        --- Professional ---                           [phone], Colonoscopy, flexible; with removal of                            tumor(s), polyp(s), or other lesion(s) by snare                            technique Diagnosis Code(s):        --- Professional ---                           Z80.0, Family history of malignant neoplasm of                            digestive organs                           D12.0, Benign neoplasm of cecum                           D12.5, Benign neoplasm of sigmoid colon CPT copyright 2022 [employer]. All rights reserved. The codes documented in this report are preliminary and upon coder review may  be revised to meet current compliance requirements. Zeya Balles L. Dominic Friendly, MD 05/24/2023 11:35:02 AM This report has been signed electronically. Number of Addenda: 0

## 2023-05-24 NOTE — H&P (Signed)
 History and Physical:  This patient presents for endoscopic testing for: Family history of colon cancer  53 year old woman here today for screening colonoscopy with an elevated risk due to family history of colon cancer in her father and maternal grandmother. Patient denies chronic abdominal pain, rectal bleeding, constipation or diarrhea. Her procedure is being done in the hospital outpatient endoscopy lab due to history of a Chiari malformation found on imaging and question of whether that may increase the risk of sedation.  (There are further chart notes with details of that)  Patient is otherwise without complaints or active issues today.   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   Complication of anesthesia    patient woke up while they were sewing up her incision with breast lumpectomy   Depression    Diabetes (HCC)    type 2   Dysrhythmia    Tachycardia   Enlarged thyroid     Family history of adverse reaction to anesthesia    mom , It would trigger her asthma   Fatigue 10/08/2010   Fibromyalgia 2009   GERD (gastroesophageal reflux disease) 09/26/2013   Headache(784.0) 10/08/2010   Migraines   Hip pain, bilateral 11/06/2010   Hyperglycemia 08/11/2015   normal Hgb A 1C per patient   Hypertension    Hypothyroidism 10/08/2010   Low back pain radiating to right leg 03/11/2015   Other and unspecified hyperlipidemia 07/29/2012   Peripheral neuropathy 11/11/2012   burning sensation mainly right leg   .. pt. denies neuropathy   Poor concentration 01/11/2011   Preventative health care 07/29/2012   S/P laparoscopic hysterectomy 10/07/2015   Sinusitis, acute 07/27/2011   Tachycardia    Thyroid  disease 10/08/2010   Tick bite of flank 11/09/2011   Tinea corporis 11/11/2012   Uterine leiomyoma    Walking pneumonia       Past Surgical History: Past Surgical History:  Procedure Laterality Date   ABDOMINAL HYSTERECTOMY     BREAST LUMPECTOMY Right 1999   benign    CYSTOSCOPY N/A 10/07/2015   Procedure: CYSTOSCOPY;  Surgeon: Artemisa Bile, MD;  Location: [facility];  Service: Gynecology;  Laterality: N/A;   FOOT SURGERY Bilateral    Shaved off piece of bone bil baby toes    Allergies: Allergies  Allergen Reactions   Promethazine Anaphylaxis   Promethazine Hcl Anaphylaxis    Outpatient Meds: Current Facility-Administered Medications  Medication Dose Route Frequency Provider Last Rate Last Admin   0.9 %  sodium chloride  infusion    Continuous PRN Kerby Pearson III, MD 20 mL/hr at 05/24/23 0926 500 mL at 05/24/23 0926      ___________________________________________________________________ Objective   Exam:  BP (!) 146/74   Pulse 93   Temp (!) 97.3 F (36.3 C) (Temporal)   Resp 19   Ht 4\' 11"  (1.499 m)   Wt 69.4 kg   LMP 09/02/2015 (Approximate) Comment: continous bleeding  SpO2 96%   BMI 30.90 kg/m   CV: regular , S1/S2 Resp: clear to auscultation bilaterally, normal RR and effort noted GI: soft, no tenderness, with active bowel sounds.   Assessment: Family history of colorectal cancer   Plan: Colonoscopy   The benefits and risks of the planned procedure(s) were described in detail with the patient or (when appropriate) their health care proxy.  Risks were outlined as  including, but not limited to, bleeding, infection, perforation, adverse medication reaction leading to cardiac or pulmonary decompensation, pancreatitis (if ERCP).  The limitation of incomplete mucosal visualization was also discussed.  No guarantees or warranties were given.  The patient is appropriate for an endoscopic procedure in the ambulatory setting.   - Lorella Roles, MD

## 2023-05-25 ENCOUNTER — Encounter (HOSPITAL_COMMUNITY): Payer: Self-pay | Admitting: Gastroenterology

## 2023-05-25 ENCOUNTER — Other Ambulatory Visit: Payer: Self-pay | Admitting: Adult Health

## 2023-05-25 DIAGNOSIS — F411 Generalized anxiety disorder: Secondary | ICD-10-CM

## 2023-05-25 LAB — SURGICAL PATHOLOGY

## 2023-05-30 ENCOUNTER — Encounter: Payer: Self-pay | Admitting: Adult Health

## 2023-05-30 ENCOUNTER — Telehealth (INDEPENDENT_AMBULATORY_CARE_PROVIDER_SITE_OTHER): Payer: 59 | Admitting: Adult Health

## 2023-05-30 DIAGNOSIS — F410 Panic disorder [episodic paroxysmal anxiety] without agoraphobia: Secondary | ICD-10-CM | POA: Diagnosis not present

## 2023-05-30 DIAGNOSIS — F331 Major depressive disorder, recurrent, moderate: Secondary | ICD-10-CM

## 2023-05-30 DIAGNOSIS — F411 Generalized anxiety disorder: Secondary | ICD-10-CM | POA: Diagnosis not present

## 2023-05-30 DIAGNOSIS — G4700 Insomnia, unspecified: Secondary | ICD-10-CM | POA: Diagnosis not present

## 2023-05-30 MED ORDER — ALPRAZOLAM 1 MG PO TABS: 1.0000 mg | ORAL_TABLET | Freq: Two times a day (BID) | ORAL | 2 refills | Status: DC

## 2023-05-30 NOTE — Progress Notes (Signed)
 Lauren Hall [id number] Jun 27, 1970 53 y.o.  Virtual Visit via Video Note  I connected with pt @ on 05/30/23 at  2:30 PM EDT by a video enabled telemedicine application and verified that I am speaking with the correct person using two identifiers.   I discussed the limitations of evaluation and management by telemedicine and the availability of in person appointments. The patient expressed understanding and agreed to proceed.  I discussed the assessment and treatment plan with the patient. The patient was provided an opportunity to ask questions and all were answered. The patient agreed with the plan and demonstrated an understanding of the instructions.   The patient was advised to call back or seek an in-person evaluation if the symptoms worsen or if the condition fails to improve as anticipated.  I provided 25 minutes of non-face-to-face time during this encounter.  The patient was located at home.  The provider was located at [facility].   Reagan Camera, NP   Subjective:   Patient ID:  Lauren Hall is a 53 y.o. (DOB [birth year]) female.  Chief Complaint: No chief complaint on file.   HPI Lauren Hall presents for follow-up of MDD, GAD and insomnia and panic attacks.  Describes mood today as "about the same". Pleasant. Tearful at times. Mood symptoms - reports some depression and anxiety - "I feel more even keeled". Reports decreased interest and motivation - "having to push herself to do the things she doesn't want to do". Denies irritability. Denies panic attacks - xanax  helpful. Reports some worry, rumination and over thinking. Reports ongoing situational stressors. Mood is variable. Stating "overall, I feel like I'm doing ok". Feels like medications are helpful. Working with a therapist - Espiridion Heft. Taking medications as prescribed. Energy levels lower - feels tired - "a part of Fibro life". Active, does not have a regular exercise routine with physical  disabilities.  Enjoys some usual interests and activities. Married. Lives with husband and daughter. Appetite adequate. Weight loss - 153 from 155 pounds - Trulicity. Sleeping better some nights than others. Averages 8 hours. Reports focus and concentration difficulties - "fibro fog". Completing tasks. Managing some aspects of household. 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  Denies AH or VH. Denies self harm. Denies substance use.  Previous medication trials: Pristiq , Wellbutrin , Prozac, Cymbalta, Lexapro , Zoloft,   Review of Systems:  Review of Systems  Musculoskeletal:  Negative for gait problem.  Neurological:  Negative for tremors.  Psychiatric/Behavioral:         Please refer to HPI    Medications: I have reviewed the patient's current medications.  Current Outpatient Medications  Medication Sig Dispense Refill   ALPRAZolam  (XANAX ) 1 MG tablet Take 1 tablet (1 mg total) by mouth 2 (two) times daily. 60 tablet 2   amLODipine  (NORVASC ) 5 MG tablet Take 1 tablet (5 mg total) by mouth daily. 90 tablet 3   atorvastatin  (LIPITOR) 10 MG tablet Take 0.5 tablets (5 mg total) by mouth daily. 15 tablet 5   Blood Glucose Monitoring Suppl DEVI 1 each by Does not apply route in the morning, at noon, and at bedtime. May substitute to any manufacturer covered by patient's insurance. 1 each 0   busPIRone  (BUSPAR ) 5 MG tablet TAKE 1 TABLET(5 MG) BY MOUTH THREE TIMES DAILY 270 tablet 3   Dulaglutide (TRULICITY) 1.5 MG/0.5ML SOAJ Inject 1.5 mg into the skin once a week. 2 mL 3   gabapentin  (NEURONTIN ) 300 MG capsule TAKE 1  CAPSULE BY MOUTH TWICE A DAY AND TAKE 2 CAPSULES BY MOUTH AT BEDTIME 120 capsule 1   Glucose Blood (BLOOD GLUCOSE TEST STRIPS) STRP Check blood sugars 3 times daily 300 each 12   HYDROcodone -acetaminophen  (NORCO/VICODIN) 5-325 MG tablet Take 1 tablet by mouth every 6 (six) hours as needed for moderate pain (pain score 4-6). 60 tablet 0   Lancets (ONETOUCH DELICA PLUS LANCET30G)  MISC Check blood sugars 3 times daily 300 each 12   levothyroxine  (SYNTHROID ) 25 MCG tablet TAKE 1 TABLET BY MOUTH DAILY BEFORE BREAKFAST. 90 tablet 1   lisinopril  (ZESTRIL ) 10 MG tablet TAKE 1 TABLET (10 MG TOTAL) BY MOUTH IN THE MORNING AND AT BEDTIME 60 tablet 2   loratadine (CLARITIN) 10 MG tablet Take 10 mg by mouth daily.     LOW-DOSE ASPIRIN PO      meloxicam  (MOBIC ) 15 MG tablet Take 1 tablet (15 mg total) by mouth daily as needed for pain. 30 tablet 03   omeprazole  (PRILOSEC) 40 MG capsule Take 1 capsule (40 mg total) by mouth daily. 90 capsule 0   ondansetron  (ZOFRAN ) 4 MG tablet TAKE 1 TABLET BY MOUTH EVERY 8 HOURS AS NEEDED FOR NAUSEA AND VOMITING 18 tablet 3   tiZANidine  (ZANAFLEX ) 2 MG tablet Take 0.5-2 tablets (1-4 mg total) by mouth every 8 (eight) hours as needed for muscle spasms. 60 tablet 3   venlafaxine  XR (EFFEXOR -XR) 150 MG 24 hr capsule Take 1 capsule (150 mg total) by mouth every morning. 90 capsule 3   No current facility-administered medications for this visit.    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   Complication of anesthesia    patient woke up while they were sewing up her incision with breast lumpectomy   Depression    Diabetes (HCC)    type 2   Dysrhythmia    Tachycardia   Enlarged thyroid     Family history of adverse reaction to anesthesia    mom , It would trigger her asthma   Fatigue 10/08/2010   Fibromyalgia 2009   GERD (gastroesophageal reflux disease) 09/26/2013   Headache(784.0) 10/08/2010   Migraines   Hip pain, bilateral 11/06/2010   Hyperglycemia 08/11/2015   normal Hgb A 1C per patient   Hypertension    Hypothyroidism 10/08/2010   Low back pain radiating to  right leg 03/11/2015   Other and unspecified hyperlipidemia 07/29/2012   Peripheral neuropathy 11/11/2012   burning sensation mainly right leg   .. pt. denies neuropathy   Poor concentration 01/11/2011   Preventative health care 07/29/2012   S/P laparoscopic hysterectomy 10/07/2015   Sinusitis, acute 07/27/2011   Tachycardia    Thyroid  disease 10/08/2010   Tick bite of flank 11/09/2011   Tinea corporis 11/11/2012   Uterine leiomyoma    Walking pneumonia     Family History  Problem Relation Age of Onset   Colon polyps Mother    Fibromyalgia Mother    Diabetes Mother        type 2   Hypertension Mother    Hyperlipidemia Mother    Arthritis Mother    Alcohol abuse Father    Colon cancer Father    Hypertension Brother  Alcohol abuse Brother    Heart disease Maternal Grandmother    Hyperlipidemia Maternal Grandmother    Diabetes Maternal Grandmother        type 2   Colon cancer Maternal Grandmother    Stroke Maternal Grandfather    Diabetes Maternal Grandfather        type 2   Ovarian cancer Paternal Grandmother    Esophageal cancer Paternal Grandfather    Alzheimer's disease Paternal Grandfather    Rectal cancer Neg Hx    Stomach cancer Neg Hx     Social History   Socioeconomic History   Marital status: Married    Spouse name: Derrick   Number of children: Not on file   Years of education: Not on file   Highest education level: Not on file  Occupational History   Not on file  Tobacco Use   Smoking status: Never   Smokeless tobacco: Never  Vaping Use   Vaping status: Never Used  Substance and Sexual Activity   Alcohol use: Not Currently    Comment: special occassion- very rarely   Drug use: No   Sexual activity: Yes    Partners: Male    Birth control/protection: Surgical  Other Topics Concern   Not on file  Social History Narrative   Not on file   Social Drivers of Health   Financial Resource Strain: Not on file  Food Insecurity: Not on file   Transportation Needs: Not on file  Physical Activity: Not on file  Stress: Not on file  Social Connections: Not on file  Intimate Partner Violence: Not on file    Past Medical History, Surgical history, Social history, and Family history were reviewed and updated as appropriate.   Please see review of systems for further details on the patient's review from today.   Objective:   Physical Exam:  LMP 09/02/2015 (Approximate) Comment: continous bleeding  Physical Exam Constitutional:      General: She is not in acute distress. Musculoskeletal:        General: No deformity.  Neurological:     Mental Status: She is alert and oriented to person, place, and time.     Coordination: Coordination normal.  Psychiatric:        Attention and Perception: Attention and perception normal. She does not perceive auditory or visual hallucinations.        Mood and Affect: Mood normal. Mood is not anxious or depressed. Affect is not labile, blunt, angry or inappropriate.        Speech: Speech normal.        Behavior: Behavior normal.        Thought Content: Thought content normal. Thought content is not paranoid or delusional. Thought content does not include homicidal or suicidal ideation. Thought content does not include homicidal or suicidal plan.        Cognition and Memory: Cognition and memory normal.        Judgment: Judgment normal.     Comments: Insight intact     Lab Review:     Component Value Date/Time   NA 140 02/23/2023 1623   NA 142 10/18/2022 0000   K 3.8 02/23/2023 1623   CL 101 02/23/2023 1623   CO2 32 02/23/2023 1623   GLUCOSE 90 02/23/2023 1623   BUN 6 02/23/2023 1623   BUN 5 (L) 10/18/2022 0000   CREATININE 0.92 02/23/2023 1623   CREATININE 0.79 09/24/2013 1544   CALCIUM  9.4 02/23/2023 1623   PROT 7.7 02/23/2023  1623   ALBUMIN 4.6 02/23/2023 1623   AST 13 02/23/2023 1623   ALT 15 02/23/2023 1623   ALKPHOS 102 02/23/2023 1623   BILITOT 0.4 02/23/2023 1623    GFRNONAA >60 01/17/2018 0128   GFRAA >60 01/17/2018 0128       Component Value Date/Time   WBC 6.4 02/23/2023 1623   RBC 4.84 02/23/2023 1623   HGB 14.8 02/23/2023 1623   HCT 44.3 02/23/2023 1623   PLT 347.0 02/23/2023 1623   MCV 91.6 02/23/2023 1623   MCH 30.4 01/17/2018 0128   MCHC 33.4 02/23/2023 1623   RDW 13.8 02/23/2023 1623   LYMPHSABS 2.6 02/23/2023 1623   MONOABS 0.4 02/23/2023 1623   EOSABS 0.1 02/23/2023 1623   BASOSABS 0.0 02/23/2023 1623    No results found for: "POCLITH", "LITHIUM"   No results found for: "PHENYTOIN", "PHENOBARB", "VALPROATE", "CBMZ"   .res Assessment: Plan:    Plan:  PDMP reviewed  1. Buspar  5mg  TID 2. Effexor  XR 150mg  daily   3. Xanax  1mg  BID  Therapist - Espiridion Heft  RTC 3 months  25 minutes spent dedicated to the care of this patient on the date of this encounter to include pre-visit review of records, ordering of medication, post visit documentation, and face-to-face time with the patient discussing depression, anxiety, insomnia and panic attacks. Discussed current medications to treat symptoms.  Patient advised to contact office with any questions, adverse effects, or acute worsening in signs and symptoms.  Discussed potential benefits, risk, and side effects of benzodiazepines to include potential risk of tolerance and dependence, as well as possible drowsiness.  Advised patient not to drive if experiencing drowsiness and to take lowest possible effective dose to minimize risk of dependence and tolerance.  Discussed potential metabolic side effects associated with atypical antipsychotics, as well as potential risk for movement side effects. Advised pt to contact office if movement side effects occur.    Diagnoses and all orders for this visit:  Major depressive disorder, recurrent episode, moderate (HCC)  Generalized anxiety disorder -     ALPRAZolam  (XANAX ) 1 MG tablet; Take 1 tablet (1 mg total) by mouth 2 (two) times  daily.  Panic attacks  Insomnia, unspecified type     Please see After Visit Summary for patient specific instructions.  Future Appointments  Date Time Provider Department Center  06/08/2023  2:30 PM Isidro Margo, DO [facility] None  07/28/2023 10:40 AM Neda Balk, MD [facility]    No orders of the defined types were placed in this encounter.     -------------------------------

## 2023-05-31 ENCOUNTER — Encounter: Payer: Self-pay | Admitting: Gastroenterology

## 2023-05-31 DIAGNOSIS — F332 Major depressive disorder, recurrent severe without psychotic features: Secondary | ICD-10-CM | POA: Diagnosis not present

## 2023-06-03 DIAGNOSIS — F332 Major depressive disorder, recurrent severe without psychotic features: Secondary | ICD-10-CM | POA: Diagnosis not present

## 2023-06-07 NOTE — Progress Notes (Deleted)
 [facility] [address] [place] [address] Phone: [phone] Subjective:    I'Lauren seeing this patient by the request  of:  Neda Balk, MD  CC: Back and neck pain follow-up  [id number]  Lauren Hall is a 53 y.o. female coming in with complaint of back and neck pain. OMT on 04/20/2023. Patient states   Medications patient has been prescribed:   Taking:         Reviewed prior external information including notes and imaging from previsou exam, outside providers and external EMR if available.   As well as notes that were available from care everywhere and other healthcare systems.  Past medical history, social, surgical and family history all reviewed in electronic medical record.  No pertanent information unless stated regarding to the chief complaint.   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   Complication of anesthesia    patient woke up while they were sewing up her incision with breast lumpectomy   Depression    Diabetes (HCC)    type 2   Dysrhythmia    Tachycardia   Enlarged thyroid     Family history of adverse reaction to anesthesia    mom , It would trigger her asthma   Fatigue 10/08/2010   Fibromyalgia 2009   GERD (gastroesophageal reflux disease) 09/26/2013   Headache(784.0) 10/08/2010   Migraines   Hip pain, bilateral 11/06/2010   Hyperglycemia 08/11/2015   normal Hgb A 1C per patient   Hypertension    Hypothyroidism 10/08/2010   Low back pain radiating to right leg 03/11/2015   Other and unspecified hyperlipidemia 07/29/2012   Peripheral neuropathy 11/11/2012   burning sensation mainly right leg   .. pt. denies neuropathy   Poor concentration 01/11/2011   Preventative health care 07/29/2012   S/P laparoscopic hysterectomy 10/07/2015   Sinusitis, acute 07/27/2011    Tachycardia    Thyroid  disease 10/08/2010   Tick bite of flank 11/09/2011   Tinea corporis 11/11/2012   Uterine leiomyoma    Walking pneumonia     Allergies  Allergen Reactions   Promethazine Anaphylaxis   Promethazine Hcl Anaphylaxis     Review of Systems:  No headache, visual changes, nausea, vomiting, diarrhea, constipation, dizziness, abdominal pain, skin rash, fevers, chills, night sweats, weight loss, swollen lymph nodes, body aches, joint swelling, chest pain, shortness of breath, mood changes. POSITIVE muscle aches  Objective  Last menstrual period 09/02/2015.   General: No apparent distress alert and oriented x3 mood and affect normal, dressed appropriately.  HEENT: Pupils equal, extraocular movements intact  Respiratory: Patient's speak in full sentences and does not appear short of breath  Cardiovascular: No lower extremity edema, non tender, no erythema  Gait MSK:  Back   Osteopathic findings  C2 flexed rotated and side bent right C6 flexed rotated and side bent left T3 extended rotated and side bent right inhaled rib T9 extended rotated and side bent left L2 flexed rotated and side bent right Sacrum right on right     Assessment and Plan:  No problem-specific Assessment & Plan notes found for this encounter.    Nonallopathic problems  Decision today to treat with OMT was based on Physical Exam  After verbal consent patient was treated with HVLA, ME, FPR techniques in cervical, rib, thoracic, lumbar, and sacral  areas  Patient tolerated the procedure well with improvement in symptoms  Patient given exercises, stretches and lifestyle modifications  See medications in patient instructions if given  Patient will follow up in 4-8 weeks     The above documentation has been reviewed and is accurate and complete Lauren Hall Lauren Asher Torpey, DO         Note: This dictation was prepared with Dragon dictation along with smaller phrase technology. Any  transcriptional errors that result from this process are unintentional.

## 2023-06-08 ENCOUNTER — Ambulatory Visit: Admitting: Family Medicine

## 2023-06-22 DIAGNOSIS — F332 Major depressive disorder, recurrent severe without psychotic features: Secondary | ICD-10-CM | POA: Diagnosis not present

## 2023-06-23 ENCOUNTER — Other Ambulatory Visit: Payer: Self-pay | Admitting: Family Medicine

## 2023-06-23 ENCOUNTER — Other Ambulatory Visit: Payer: Self-pay | Admitting: Adult Health

## 2023-06-23 DIAGNOSIS — F411 Generalized anxiety disorder: Secondary | ICD-10-CM

## 2023-06-23 NOTE — Telephone Encounter (Signed)
 Requesting: hydrocodone  5/325mg  Contract: None UDS:  None Last Visit: 04/12/23 Next Visit: 07/28/23 Last Refill: 05/23/23 #60 and 0RF   Please Advise

## 2023-06-23 NOTE — Telephone Encounter (Unsigned)
 Copied from CRM [id number]. Topic: Clinical - Medication Refill >> Jun 23, 2023  4:28 PM Magdalene School wrote: Medication: HYDROcodone -acetaminophen  (NORCO/VICODIN) 5-325 MG tablet  Has the patient contacted their pharmacy? Yes (Agent: If no, request that the patient contact the pharmacy for the refill. If patient does not wish to contact the pharmacy document the reason why and proceed with request.) (Agent: If yes, when and what did the pharmacy advise?) pharmacy advised to contact office.  This is the patient's preferred pharmacy:  [id number] - [place], [place] - [address] AT [facility] [address] [place] [address] Phone: [phone] Fax: [phone]  Is this the correct pharmacy for this prescription? Yes If no, delete pharmacy and type the correct one.   Has the prescription been filled recently? No  Is the patient out of the medication? Yes  Has the patient been seen for an appointment in the last year OR does the patient have an upcoming appointment? Yes  Can we respond through MyChart? Yes  Agent: Please be advised that Rx refills may take up to 3 business days. We ask that you follow-up with your pharmacy.

## 2023-06-25 MED ORDER — HYDROCODONE-ACETAMINOPHEN 5-325 MG PO TABS: 1.0000 | ORAL_TABLET | Freq: Four times a day (QID) | ORAL | 0 refills | Status: DC | PRN

## 2023-06-30 NOTE — Progress Notes (Unsigned)
 [facility] [address] [place] [address] Phone: [phone] Subjective:   Lauren Hall, am serving as a scribe for Dr. Ronnell Hall.  I'm seeing this patient by the request  of:  Lauren Balk, MD  CC: Back and neck pain follow-up  [id number]  Lauren Hall is a 53 y.o. female coming in with complaint of back and neck pain. OMT 04/20/2023. Patient states that the hips continue to bother her. She is stiff. Manipulation is helpful.   Medications patient has been prescribed: None          Reviewed prior external information including notes and imaging from previsou exam, outside providers and external EMR if available.   As well as notes that were available from care everywhere and other healthcare systems.  Past medical history, social, surgical and family history all reviewed in electronic medical record.  No pertanent information unless stated regarding to the chief complaint.   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   Complication of anesthesia    patient woke up while they were sewing up her incision with breast lumpectomy   Depression    Diabetes (HCC)    type 2   Dysrhythmia    Tachycardia   Enlarged thyroid     Family history of adverse reaction to anesthesia    mom , It would trigger her asthma   Fatigue 10/08/2010   Fibromyalgia 2009   GERD (gastroesophageal reflux disease) 09/26/2013   Headache(784.0) 10/08/2010   Migraines   Hip pain, bilateral 11/06/2010   Hyperglycemia 08/11/2015   normal Hgb A 1C per patient   Hypertension    Hypothyroidism 10/08/2010   Low back pain radiating to right leg 03/11/2015   Other and unspecified hyperlipidemia 07/29/2012   Peripheral neuropathy 11/11/2012   burning sensation mainly right leg   .. pt. denies neuropathy   Poor  concentration 01/11/2011   Preventative health care 07/29/2012   S/P laparoscopic hysterectomy 10/07/2015   Sinusitis, acute 07/27/2011   Tachycardia    Thyroid  disease 10/08/2010   Tick bite of flank 11/09/2011   Tinea corporis 11/11/2012   Uterine leiomyoma    Walking pneumonia     Allergies  Allergen Reactions   Promethazine Anaphylaxis   Promethazine Hcl Anaphylaxis     Review of Systems:  No headache, visual changes, nausea, vomiting, diarrhea, constipation, dizziness, abdominal pain, skin rash, fevers, chills, night sweats, weight loss, swollen lymph nodes, body aches, joint swelling, chest pain, shortness of breath, mood changes. POSITIVE muscle aches  Objective  Blood pressure 132/82, pulse (!) 113, height 4\' 11"  (1.499 m), last menstrual period 09/02/2015, SpO2 98%.   General: No apparent distress alert and oriented x3 mood and affect normal, dressed appropriately.  HEENT: Pupils equal, extraocular movements intact  Respiratory: Patient's speak in full sentences and does not appear short of breath  Cardiovascular: No lower extremity edema, non tender, no erythema  Gait MSK:  Back significant tightness of the back noted.  Still tenderness to palpation over the sacroiliac joint.  Patient does have some diffuse tenderness in the soft tissue of the back all the way up to the cervical spine in the occipital area.  No midline tenderness.  No radicular symptoms to the extremities  Osteopathic findings  C6 flexed rotated and side bent left T3 extended  rotated and side bent right inhaled rib T9 extended rotated and side bent left T11 extended rotated and side bent left L2 flexed rotated and side bent right L3 flexed rotated and side bent right Sacrum left on left    Assessment and Plan:  Arthritis of left sacroiliac joint (HCC) Exacerbation of the sacroiliac joint noted again.  The patient is grieving with the loss of her father here recently.  Discussed with patient the  about different treatment options as well.  Patient wanted to hold on the sacroiliac injection today but would like to consider it again.  Patient given Toradol  and Depo-Medrol  injections as well.  Increase activity slowly.  Follow-up with me again 6 to 8 weeks otherwise.    Nonallopathic problems  Decision today to treat with OMT was based on Physical Exam  After verbal consent patient was treated with HVLA, ME, FPR techniques in cervical, rib, thoracic, lumbar, and sacral  areas  Patient tolerated the procedure well with improvement in symptoms  Patient given exercises, stretches and lifestyle modifications  See medications in patient instructions if given  Patient will follow up in 4-8 weeks     The above documentation has been reviewed and is accurate and complete Lauren Hall M Lauren Denes, DO         Note: This dictation was prepared with Dragon dictation along with smaller phrase technology. Any transcriptional errors that result from this process are unintentional.

## 2023-07-01 ENCOUNTER — Encounter: Payer: Self-pay | Admitting: Family Medicine

## 2023-07-01 ENCOUNTER — Ambulatory Visit: Admitting: Family Medicine

## 2023-07-01 VITALS — BP 132/82 | HR 113 | Ht 59.0 in

## 2023-07-01 DIAGNOSIS — M9904 Segmental and somatic dysfunction of sacral region: Secondary | ICD-10-CM

## 2023-07-01 DIAGNOSIS — M9908 Segmental and somatic dysfunction of rib cage: Secondary | ICD-10-CM

## 2023-07-01 DIAGNOSIS — M9901 Segmental and somatic dysfunction of cervical region: Secondary | ICD-10-CM | POA: Diagnosis not present

## 2023-07-01 DIAGNOSIS — M9903 Segmental and somatic dysfunction of lumbar region: Secondary | ICD-10-CM

## 2023-07-01 DIAGNOSIS — M461 Sacroiliitis, not elsewhere classified: Secondary | ICD-10-CM | POA: Diagnosis not present

## 2023-07-01 DIAGNOSIS — G8929 Other chronic pain: Secondary | ICD-10-CM | POA: Diagnosis not present

## 2023-07-01 DIAGNOSIS — M545 Low back pain, unspecified: Secondary | ICD-10-CM | POA: Diagnosis not present

## 2023-07-01 DIAGNOSIS — M9902 Segmental and somatic dysfunction of thoracic region: Secondary | ICD-10-CM | POA: Diagnosis not present

## 2023-07-01 MED ORDER — KETOROLAC TROMETHAMINE 30 MG/ML IJ SOLN
30.0000 mg | Freq: Once | INTRAMUSCULAR | Status: AC
  Administered 2023-07-01: 30 mg via INTRAMUSCULAR

## 2023-07-01 MED ORDER — METHYLPREDNISOLONE ACETATE 40 MG/ML IJ SUSP
40.0000 mg | Freq: Once | INTRAMUSCULAR | Status: AC
  Administered 2023-07-01: 40 mg via INTRAMUSCULAR

## 2023-07-01 NOTE — Patient Instructions (Addendum)
 Sorry for your loss Half Cocktail injection today See you again in 5-6 weeks

## 2023-07-01 NOTE — Assessment & Plan Note (Signed)
 Exacerbation of the sacroiliac joint noted again.  The patient is grieving with the loss of her father here recently.  Discussed with patient the about different treatment options as well.  Patient wanted to hold on the sacroiliac injection today but would like to consider it again.  Patient given Toradol  and Depo-Medrol  injections as well.  Increase activity slowly.  Follow-up with me again 6 to 8 weeks otherwise.

## 2023-07-12 ENCOUNTER — Other Ambulatory Visit: Payer: Self-pay | Admitting: Family Medicine

## 2023-07-13 DIAGNOSIS — F332 Major depressive disorder, recurrent severe without psychotic features: Secondary | ICD-10-CM | POA: Diagnosis not present

## 2023-07-15 ENCOUNTER — Telehealth: Admitting: Adult Health

## 2023-07-15 ENCOUNTER — Ambulatory Visit: Admitting: Family Medicine

## 2023-07-15 ENCOUNTER — Encounter: Payer: Self-pay | Admitting: Adult Health

## 2023-07-15 DIAGNOSIS — F411 Generalized anxiety disorder: Secondary | ICD-10-CM | POA: Diagnosis not present

## 2023-07-15 DIAGNOSIS — F331 Major depressive disorder, recurrent, moderate: Secondary | ICD-10-CM

## 2023-07-15 DIAGNOSIS — F410 Panic disorder [episodic paroxysmal anxiety] without agoraphobia: Secondary | ICD-10-CM

## 2023-07-15 DIAGNOSIS — G4700 Insomnia, unspecified: Secondary | ICD-10-CM

## 2023-07-15 MED ORDER — ALPRAZOLAM 1 MG PO TABS: 1.0000 mg | ORAL_TABLET | Freq: Two times a day (BID) | ORAL | 2 refills | Status: DC
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
## 2023-07-19 ENCOUNTER — Telehealth: Payer: Self-pay | Admitting: Family Medicine
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
## 2023-07-22 NOTE — Telephone Encounter (Signed)
 Scheduled for 07/29/23

## 2023-07-24 NOTE — Assessment & Plan Note (Signed)
 She is also encouraged to hydrate, exercise and get plenty of sleep

## 2023-07-24 NOTE — Assessment & Plan Note (Signed)
 On Venlafaxine  but she is struggling as her husband's health continues to worsen. She struggles with eating anything and can only drink water and mushroom coffee without vomiting. Her appetite is suppressed. Has added back 1 meal a day since her husband has gotten home.

## 2023-07-24 NOTE — Assessment & Plan Note (Signed)
 On Levothyroxine, continue to monitor

## 2023-07-24 NOTE — Assessment & Plan Note (Signed)
 hgba1c acceptable, minimize simple carbs. Increase exercise as tolerated. Continue current meds

## 2023-07-24 NOTE — Assessment & Plan Note (Signed)
 Well controlled, no changes to meds. Encouraged heart healthy diet such as the DASH diet and exercise as tolerated. Tolerating Atorvastatin

## 2023-07-24 NOTE — Assessment & Plan Note (Signed)
 Well controlled, no changes to meds. Encouraged heart healthy diet such as the DASH diet and exercise as tolerated.

## 2023-07-27 ENCOUNTER — Other Ambulatory Visit

## 2023-07-27 DIAGNOSIS — I10 Essential (primary) hypertension: Secondary | ICD-10-CM | POA: Diagnosis not present

## 2023-07-27 DIAGNOSIS — E782 Mixed hyperlipidemia: Secondary | ICD-10-CM | POA: Diagnosis not present

## 2023-07-27 DIAGNOSIS — E039 Hypothyroidism, unspecified: Secondary | ICD-10-CM

## 2023-07-27 DIAGNOSIS — E785 Hyperlipidemia, unspecified: Secondary | ICD-10-CM

## 2023-07-27 DIAGNOSIS — E1169 Type 2 diabetes mellitus with other specified complication: Secondary | ICD-10-CM

## 2023-07-27 LAB — CBC WITH DIFFERENTIAL/PLATELET
Basophils Absolute: 0 10*3/uL (ref 0.0–0.1)
Basophils Relative: 0.5 % (ref 0.0–3.0)
Eosinophils Absolute: 0.1 10*3/uL (ref 0.0–0.7)
Eosinophils Relative: 1.5 % (ref 0.0–5.0)
HCT: 41.1 % (ref 36.0–46.0)
Hemoglobin: 13.8 g/dL (ref 12.0–15.0)
Lymphocytes Relative: 32.9 % (ref 12.0–46.0)
Lymphs Abs: 2.1 10*3/uL (ref 0.7–4.0)
MCHC: 33.5 g/dL (ref 30.0–36.0)
MCV: 89.9 fl (ref 78.0–100.0)
Monocytes Absolute: 0.4 10*3/uL (ref 0.1–1.0)
Monocytes Relative: 5.8 % (ref 3.0–12.0)
Neutro Abs: 3.7 10*3/uL (ref 1.4–7.7)
Neutrophils Relative %: 59.3 % (ref 43.0–77.0)
Platelets: 381 10*3/uL (ref 150.0–400.0)
RBC: 4.57 Mil/uL (ref 3.87–5.11)
RDW: 13.9 % (ref 11.5–15.5)
WBC: 6.3 10*3/uL (ref 4.0–10.5)

## 2023-07-27 LAB — COMPREHENSIVE METABOLIC PANEL WITH GFR
ALT: 11 U/L (ref 0–35)
AST: 14 U/L (ref 0–37)
Albumin: 4.5 g/dL (ref 3.5–5.2)
Alkaline Phosphatase: 110 U/L (ref 39–117)
BUN: 5 mg/dL — ABNORMAL LOW (ref 6–23)
CO2: 31 meq/L (ref 19–32)
Calcium: 9.1 mg/dL (ref 8.4–10.5)
Chloride: 103 meq/L (ref 96–112)
Creatinine, Ser: 0.72 mg/dL (ref 0.40–1.20)
GFR: 94.62 mL/min (ref >60.00)
Glucose, Bld: 90 mg/dL (ref 70–99)
Potassium: 4 meq/L (ref 3.5–5.1)
Sodium: 140 meq/L (ref 135–145)
Total Bilirubin: 0.4 mg/dL (ref 0.2–1.2)
Total Protein: 7.5 g/dL (ref 6.0–8.3)

## 2023-07-27 LAB — LIPID PANEL
Cholesterol: 168 mg/dL (ref 0–200)
HDL: 65.1 mg/dL (ref >39.00)
LDL Cholesterol: 87 mg/dL (ref 0–99)
NonHDL: 102.51
Total CHOL/HDL Ratio: 3
Triglycerides: 80 mg/dL (ref 0.0–149.0)
VLDL: 16 mg/dL (ref 0.0–40.0)

## 2023-07-27 LAB — T4, FREE: Free T4: 0.73 ng/dL (ref 0.60–1.60)

## 2023-07-27 LAB — TSH: TSH: 0.24 u[IU]/mL — ABNORMAL LOW (ref 0.35–5.50)
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
## 2023-07-28 ENCOUNTER — Ambulatory Visit (INDEPENDENT_AMBULATORY_CARE_PROVIDER_SITE_OTHER): Payer: 59 | Admitting: Family Medicine

## 2023-07-28 ENCOUNTER — Other Ambulatory Visit: Payer: Self-pay | Admitting: Family Medicine

## 2023-07-28 ENCOUNTER — Encounter: Payer: Self-pay | Admitting: Family Medicine

## 2023-07-28 VITALS — BP 134/88 | HR 75 | Resp 16 | Ht 59.0 in | Wt 157.2 lb

## 2023-07-28 DIAGNOSIS — E079 Disorder of thyroid, unspecified: Secondary | ICD-10-CM

## 2023-07-28 DIAGNOSIS — E782 Mixed hyperlipidemia: Secondary | ICD-10-CM

## 2023-07-28 DIAGNOSIS — M797 Fibromyalgia: Secondary | ICD-10-CM

## 2023-07-28 DIAGNOSIS — E785 Hyperlipidemia, unspecified: Secondary | ICD-10-CM | POA: Diagnosis not present

## 2023-07-28 DIAGNOSIS — Z7985 Long-term (current) use of injectable non-insulin antidiabetic drugs: Secondary | ICD-10-CM

## 2023-07-28 DIAGNOSIS — E1169 Type 2 diabetes mellitus with other specified complication: Secondary | ICD-10-CM | POA: Diagnosis not present

## 2023-07-28 DIAGNOSIS — I10 Essential (primary) hypertension: Secondary | ICD-10-CM

## 2023-07-28 DIAGNOSIS — F418 Other specified anxiety disorders: Secondary | ICD-10-CM

## 2023-07-28 DIAGNOSIS — E039 Hypothyroidism, unspecified: Secondary | ICD-10-CM

## 2023-07-28 DIAGNOSIS — Z1159 Encounter for screening for other viral diseases: Secondary | ICD-10-CM

## 2023-07-28 LAB — HEMOGLOBIN A1C: Hgb A1c MFr Bld: 5.9 % (ref 4.6–6.5)

## 2023-07-28 MED ORDER — LEVOTHYROXINE SODIUM 25 MCG PO TABS: ORAL_TABLET | ORAL | 1 refills | Status: AC

## 2023-07-28 MED ORDER — HYDROCODONE-ACETAMINOPHEN 5-325 MG PO TABS: 1.0000 | ORAL_TABLET | Freq: Four times a day (QID) | ORAL | 0 refills | Status: DC | PRN

## 2023-07-28 NOTE — Patient Instructions (Addendum)
 Prevnar 20 and RSV, Respiratory Syncitial Virus Vaccine, Arexvy vaccing  At pharmacy  Hypertension, Adult High blood pressure (hypertension) is when the force of blood pumping through the arteries is too strong. The arteries are the blood vessels that carry blood from the heart throughout the body. Hypertension forces the heart to work harder to pump blood and may cause arteries to become narrow or stiff. Untreated or uncontrolled hypertension can lead to a heart attack, heart failure, a stroke, kidney disease, and other problems. A blood pressure reading consists of a higher number over a lower number. Ideally, your blood pressure should be below 120/80. The first (top) number is called the systolic pressure. It is a measure of the pressure in your arteries as your heart beats. The second (bottom) number is called the diastolic pressure. It is a measure of the pressure in your arteries as the heart relaxes. What are the causes? The exact cause of this condition is not known. There are some conditions that result in high blood pressure. What increases the risk? Certain factors may make you more likely to develop high blood pressure. Some of these risk factors are under your control, including: Smoking. Not getting enough exercise or physical activity. Being overweight. Having too much fat, sugar, calories, or salt (sodium) in your diet. Drinking too much alcohol. Other risk factors include: Having a personal history of heart disease, diabetes, high cholesterol, or kidney disease. Stress. Having a family history of high blood pressure and high cholesterol. Having obstructive sleep apnea. Age. The risk increases with age. What are the signs or symptoms? High blood pressure may not cause symptoms. Very high blood pressure (hypertensive crisis) may cause: Headache. Fast or irregular heartbeats (palpitations). Shortness of breath. Nosebleed. Nausea and vomiting. Vision changes. Severe  chest pain, dizziness, and seizures. How is this diagnosed? This condition is diagnosed by measuring your blood pressure while you are seated, with your arm resting on a flat surface, your legs uncrossed, and your feet flat on the floor. The cuff of the blood pressure monitor will be placed directly against the skin of your upper arm at the level of your heart. Blood pressure should be measured at least twice using the same arm. Certain conditions can cause a difference in blood pressure between your right and left arms. If you have a high blood pressure reading during one visit or you have normal blood pressure with other risk factors, you may be asked to: Return on a different day to have your blood pressure checked again. Monitor your blood pressure at home for 1 week or longer. If you are diagnosed with hypertension, you may have other blood or imaging tests to help your health care provider understand your overall risk for other conditions. How is this treated? This condition is treated by making healthy lifestyle changes, such as eating healthy foods, exercising more, and reducing your alcohol intake. You may be referred for counseling on a healthy diet and physical activity. Your health care provider may prescribe medicine if lifestyle changes are not enough to get your blood pressure under control and if: Your systolic blood pressure is above 130. Your diastolic blood pressure is above 80. Your personal target blood pressure may vary depending on your medical conditions, your age, and other factors. Follow these instructions at home: Eating and drinking  Eat a diet that is high in fiber and potassium, and low in sodium, added sugar, and fat. An example of this eating plan is called the DASH  diet. DASH stands for Dietary Approaches to Stop Hypertension. To eat this way: Eat plenty of fresh fruits and vegetables. Try to fill one half of your plate at each meal with fruits and vegetables. Eat  whole grains, such as whole-wheat pasta, brown rice, or whole-grain bread. Fill about one fourth of your plate with whole grains. Eat or drink low-fat dairy products, such as skim milk or low-fat yogurt. Avoid fatty cuts of meat, processed or cured meats, and poultry with skin. Fill about one fourth of your plate with lean proteins, such as fish, chicken without skin, beans, eggs, or tofu. Avoid pre-made and processed foods. These tend to be higher in sodium, added sugar, and fat. Reduce your daily sodium intake. Many people with hypertension should eat less than 1,500 mg of sodium a day. Do not drink alcohol if: Your health care provider tells you not to drink. You are pregnant, may be pregnant, or are planning to become pregnant. If you drink alcohol: Limit how much you have to: 0-1 drink a day for women. 0-2 drinks a day for men. Know how much alcohol is in your drink. In the [place] one drink equals one 12 oz bottle of beer (355 mL), one 5 oz glass of wine (148 mL), or one 1 oz glass of hard liquor (44 mL). Lifestyle  Work with your health care provider to maintain a healthy body weight or to lose weight. Ask what an ideal weight is for you. Get at least 30 minutes of exercise that causes your heart to beat faster (aerobic exercise) most days of the week. Activities may include walking, swimming, or biking. Include exercise to strengthen your muscles (resistance exercise), such as Pilates or lifting weights, as part of your weekly exercise routine. Try to do these types of exercises for 30 minutes at least 3 days a week. Do not use any products that contain nicotine or tobacco. These products include cigarettes, chewing tobacco, and vaping devices, such as e-cigarettes. If you need help quitting, ask your health care provider. Monitor your blood pressure at home as told by your health care provider. Keep all follow-up visits. This is important. Medicines Take over-the-counter and  prescription medicines only as told by your health care provider. Follow directions carefully. Blood pressure medicines must be taken as prescribed. Do not skip doses of blood pressure medicine. Doing this puts you at risk for problems and can make the medicine less effective. Ask your health care provider about side effects or reactions to medicines that you should watch for. Contact a health care provider if you: Think you are having a reaction to a medicine you are taking. Have headaches that keep coming back (recurring). Feel dizzy. Have swelling in your ankles. Have trouble with your vision. Get help right away if you: Develop a severe headache or confusion. Have unusual weakness or numbness. Feel faint. Have severe pain in your chest or abdomen. Vomit repeatedly. Have trouble breathing. These symptoms may be an emergency. Get help right away. Call 911. Do not wait to see if the symptoms will go away. Do not drive yourself to the hospital. Summary Hypertension is when the force of blood pumping through your arteries is too strong. If this condition is not controlled, it may put you at risk for serious complications. Your personal target blood pressure may vary depending on your medical conditions, your age, and other factors. For most people, a normal blood pressure is less than 120/80. Hypertension is treated with lifestyle changes,  medicines, or a combination of both. Lifestyle changes include losing weight, eating a healthy, low-sodium diet, exercising more, and limiting alcohol. This information is not intended to replace advice given to you by your health care provider. Make sure you discuss any questions you have with your health care provider. Document Revised: 11/25/2020 Document Reviewed: 11/25/2020 Elsevier Patient Education  2024 [employer].
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
## 2023-07-29 ENCOUNTER — Encounter: Payer: Self-pay | Admitting: Family Medicine

## 2023-07-29 ENCOUNTER — Ambulatory Visit: Admitting: Family Medicine

## 2023-07-29 ENCOUNTER — Ambulatory Visit: Payer: Self-pay | Admitting: Family Medicine

## 2023-07-29 VITALS — HR 93 | Ht 59.0 in | Wt 157.0 lb

## 2023-07-29 DIAGNOSIS — M542 Cervicalgia: Secondary | ICD-10-CM | POA: Diagnosis not present

## 2023-07-29 DIAGNOSIS — M9904 Segmental and somatic dysfunction of sacral region: Secondary | ICD-10-CM | POA: Diagnosis not present

## 2023-07-29 DIAGNOSIS — M9901 Segmental and somatic dysfunction of cervical region: Secondary | ICD-10-CM

## 2023-07-29 DIAGNOSIS — S134XXA Sprain of ligaments of cervical spine, initial encounter: Secondary | ICD-10-CM | POA: Diagnosis not present

## 2023-07-29 DIAGNOSIS — M9903 Segmental and somatic dysfunction of lumbar region: Secondary | ICD-10-CM

## 2023-07-29 DIAGNOSIS — M9908 Segmental and somatic dysfunction of rib cage: Secondary | ICD-10-CM | POA: Diagnosis not present

## 2023-07-29 DIAGNOSIS — M5412 Radiculopathy, cervical region: Secondary | ICD-10-CM

## 2023-07-29 DIAGNOSIS — M9902 Segmental and somatic dysfunction of thoracic region: Secondary | ICD-10-CM

## 2023-07-29 DIAGNOSIS — M461 Sacroiliitis, not elsewhere classified: Secondary | ICD-10-CM | POA: Diagnosis not present

## 2023-07-29 DIAGNOSIS — M797 Fibromyalgia: Secondary | ICD-10-CM

## 2023-07-29 LAB — MICROALBUMIN / CREATININE URINE RATIO
Creatinine,U: 67.8 mg/dL
Microalb Creat Ratio: UNDETERMINED mg/g (ref 0.0–30.0)
Microalb, Ur: <0.7 mg/dL

## 2023-07-29 MED ORDER — METHYLPREDNISOLONE ACETATE 40 MG/ML IJ SUSP
40.0000 mg | Freq: Once | INTRAMUSCULAR | Status: AC
  Administered 2023-07-29: 40 mg via INTRAMUSCULAR

## 2023-07-29 MED ORDER — KETOROLAC TROMETHAMINE 30 MG/ML IJ SOLN
30.0000 mg | Freq: Once | INTRAMUSCULAR | Status: AC
  Administered 2023-07-29: 30 mg via INTRAMUSCULAR
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
## 2023-07-29 NOTE — Patient Instructions (Signed)
 Half cocktail  Neck HEP  2 month follow up
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
## 2023-08-16 ENCOUNTER — Encounter: Payer: Self-pay | Admitting: Family Medicine

## 2023-08-16 ENCOUNTER — Other Ambulatory Visit: Payer: Self-pay | Admitting: Family Medicine

## 2023-08-18 ENCOUNTER — Ambulatory Visit

## 2023-08-18 ENCOUNTER — Ambulatory Visit: Admitting: Family Medicine

## 2023-08-18 VITALS — BP 122/82 | HR 66 | Ht 59.0 in | Wt 157.0 lb

## 2023-08-18 DIAGNOSIS — M545 Low back pain, unspecified: Secondary | ICD-10-CM

## 2023-08-18 DIAGNOSIS — M542 Cervicalgia: Secondary | ICD-10-CM

## 2023-08-18 DIAGNOSIS — M5032 Other cervical disc degeneration, mid-cervical region, unspecified level: Secondary | ICD-10-CM | POA: Diagnosis not present

## 2023-08-18 DIAGNOSIS — M4802 Spinal stenosis, cervical region: Secondary | ICD-10-CM | POA: Diagnosis not present

## 2023-08-18 DIAGNOSIS — S134XXA Sprain of ligaments of cervical spine, initial encounter: Secondary | ICD-10-CM

## 2023-08-18 DIAGNOSIS — Z041 Encounter for examination and observation following transport accident: Secondary | ICD-10-CM | POA: Diagnosis not present

## 2023-08-18 DIAGNOSIS — M47816 Spondylosis without myelopathy or radiculopathy, lumbar region: Secondary | ICD-10-CM | POA: Diagnosis not present

## 2023-08-18 DIAGNOSIS — M419 Scoliosis, unspecified: Secondary | ICD-10-CM | POA: Diagnosis not present

## 2023-08-18 DIAGNOSIS — M48061 Spinal stenosis, lumbar region without neurogenic claudication: Secondary | ICD-10-CM | POA: Diagnosis not present

## 2023-08-18 MED ORDER — KETOROLAC TROMETHAMINE 30 MG/ML IJ SOLN
30.0000 mg | Freq: Once | INTRAMUSCULAR | Status: AC
  Administered 2023-08-18: 30 mg via INTRAMUSCULAR

## 2023-08-18 MED ORDER — METHYLPREDNISOLONE ACETATE 40 MG/ML IJ SUSP
40.0000 mg | Freq: Once | INTRAMUSCULAR | Status: AC
  Administered 2023-08-18: 40 mg via INTRAMUSCULAR

## 2023-08-18 NOTE — Addendum Note (Signed)
 Addended by: TONNIE SHU D on: 08/18/2023 02:38 PM   Modules accepted: Orders

## 2023-08-18 NOTE — Patient Instructions (Signed)
 Injections in backside Xray cervical and lumbar MRI cervical and lumbar [facility] [phone] We will be in toiuch
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
## 2023-08-19 ENCOUNTER — Other Ambulatory Visit: Payer: Self-pay | Admitting: Family Medicine

## 2023-08-19 DIAGNOSIS — M545 Low back pain, unspecified: Secondary | ICD-10-CM

## 2023-08-25 ENCOUNTER — Ambulatory Visit: Payer: Self-pay | Admitting: Family Medicine

## 2023-08-29 ENCOUNTER — Other Ambulatory Visit: Payer: Self-pay | Admitting: Family Medicine

## 2023-08-29 ENCOUNTER — Telehealth (INDEPENDENT_AMBULATORY_CARE_PROVIDER_SITE_OTHER): Admitting: Adult Health

## 2023-08-29 ENCOUNTER — Encounter: Payer: Self-pay | Admitting: Adult Health

## 2023-08-29 DIAGNOSIS — F331 Major depressive disorder, recurrent, moderate: Secondary | ICD-10-CM

## 2023-08-29 DIAGNOSIS — F410 Panic disorder [episodic paroxysmal anxiety] without agoraphobia: Secondary | ICD-10-CM | POA: Diagnosis not present

## 2023-08-29 DIAGNOSIS — F411 Generalized anxiety disorder: Secondary | ICD-10-CM | POA: Diagnosis not present

## 2023-08-29 DIAGNOSIS — G4700 Insomnia, unspecified: Secondary | ICD-10-CM

## 2023-08-29 MED ORDER — BUSPIRONE HCL 10 MG PO TABS: 10.0000 mg | ORAL_TABLET | Freq: Three times a day (TID) | ORAL | 1 refills | Status: AC
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
## 2023-08-29 NOTE — Telephone Encounter (Unsigned)
 Copied from CRM [id number]. Topic: Clinical - Medication Refill >> Aug 29, 2023  3:27 PM Burnard DEL wrote: Medication: HYDROcodone -acetaminophen  (NORCO/VICODIN) 5-325 MG tablet  Has the patient contacted their pharmacy? Yes (Agent: If no, request that the patient contact the pharmacy for the refill. If patient does not wish to contact the pharmacy document the reason why and proceed with request.) (Agent: If yes, when and what did the pharmacy advise?)  This is the patient's preferred pharmacy:  [id number] - [place], [place] - [address] AT [facility] [address] [place] [address] Phone: [phone] Fax: [phone]  Is this the correct pharmacy for this prescription? Yes If no, delete pharmacy and type the correct one.   Has the prescription been filled recently? No  Is the patient out of the medication? Yes  Has the patient been seen for an appointment in the last year OR does the patient have an upcoming appointment? Yes  Can we respond through MyChart? Yes  Agent: Please be advised that Rx refills may take up to 3 business days. We ask that you follow-up with your pharmacy.

## 2023-08-30 MED ORDER — HYDROCODONE-ACETAMINOPHEN 5-325 MG PO TABS
1.0000 | ORAL_TABLET | Freq: Four times a day (QID) | ORAL | 0 refills | Status: DC | PRN
Start: 2023-08-30 — End: 2023-10-04

## 2023-08-30 NOTE — Telephone Encounter (Signed)
 Requesting: hydrocodone  5/325mg  Contract: None UDS:  None Last Visit: 07/28/2023 Next Visit: not scheduled Last Refill: 07/28/23 #60 and 0RF    Please Advise

## 2023-09-06 ENCOUNTER — Ambulatory Visit
Admission: RE | Admit: 2023-09-06 | Discharge: 2023-09-06 | Disposition: A | Discharge status: Home / Self Care | Payer: Self-pay | Source: Ambulatory Visit | Attending: Family Medicine | Admitting: Family Medicine

## 2023-09-06 DIAGNOSIS — M542 Cervicalgia: Secondary | ICD-10-CM

## 2023-09-06 DIAGNOSIS — M50223 Other cervical disc displacement at C6-C7 level: Secondary | ICD-10-CM | POA: Diagnosis not present

## 2023-09-06 DIAGNOSIS — M47812 Spondylosis without myelopathy or radiculopathy, cervical region: Secondary | ICD-10-CM | POA: Diagnosis not present

## 2023-09-06 DIAGNOSIS — M47817 Spondylosis without myelopathy or radiculopathy, lumbosacral region: Secondary | ICD-10-CM | POA: Diagnosis not present

## 2023-09-06 DIAGNOSIS — M5126 Other intervertebral disc displacement, lumbar region: Secondary | ICD-10-CM | POA: Diagnosis not present

## 2023-09-06 DIAGNOSIS — M545 Low back pain, unspecified: Secondary | ICD-10-CM

## 2023-09-09 DIAGNOSIS — F332 Major depressive disorder, recurrent severe without psychotic features: Secondary | ICD-10-CM | POA: Diagnosis not present

## 2023-09-12 ENCOUNTER — Other Ambulatory Visit: Payer: Self-pay

## 2023-09-12 ENCOUNTER — Other Ambulatory Visit: Payer: Self-pay | Admitting: Family Medicine

## 2023-09-12 DIAGNOSIS — M5412 Radiculopathy, cervical region: Secondary | ICD-10-CM

## 2023-09-14 ENCOUNTER — Other Ambulatory Visit

## 2023-09-14 ENCOUNTER — Encounter: Payer: Self-pay | Admitting: Family Medicine

## 2023-09-14 ENCOUNTER — Encounter: Payer: Self-pay | Admitting: Gastroenterology

## 2023-09-14 ENCOUNTER — Ambulatory Visit: Admitting: Gastroenterology

## 2023-09-14 VITALS — BP 120/66 | HR 80 | Ht 59.0 in | Wt 150.4 lb

## 2023-09-14 DIAGNOSIS — R110 Nausea: Secondary | ICD-10-CM

## 2023-09-14 DIAGNOSIS — R12 Heartburn: Secondary | ICD-10-CM | POA: Diagnosis not present

## 2023-09-14 DIAGNOSIS — R1011 Right upper quadrant pain: Secondary | ICD-10-CM

## 2023-09-14 DIAGNOSIS — E039 Hypothyroidism, unspecified: Secondary | ICD-10-CM

## 2023-09-14 DIAGNOSIS — R140 Abdominal distension (gaseous): Secondary | ICD-10-CM

## 2023-09-14 LAB — COMPREHENSIVE METABOLIC PANEL WITH GFR
ALT: 12 U/L (ref 0–35)
AST: 12 U/L (ref 0–37)
Albumin: 4.4 g/dL (ref 3.5–5.2)
Alkaline Phosphatase: 97 U/L (ref 39–117)
BUN: 7 mg/dL (ref 6–23)
CO2: 33 meq/L — ABNORMAL HIGH (ref 19–32)
Calcium: 9 mg/dL (ref 8.4–10.5)
Chloride: 100 meq/L (ref 96–112)
Creatinine, Ser: 0.84 mg/dL (ref 0.40–1.20)
GFR: 78.57 mL/min (ref >60.00)
Glucose, Bld: 94 mg/dL (ref 70–99)
Potassium: 3.8 meq/L (ref 3.5–5.1)
Sodium: 139 meq/L (ref 135–145)
Total Bilirubin: 0.3 mg/dL (ref 0.2–1.2)
Total Protein: 7.4 g/dL (ref 6.0–8.3)

## 2023-09-14 LAB — CBC WITH DIFFERENTIAL/PLATELET
Basophils Absolute: 0 K/uL (ref 0.0–0.1)
Basophils Relative: 0.5 % (ref 0.0–3.0)
Eosinophils Absolute: 0 K/uL (ref 0.0–0.7)
Eosinophils Relative: 0.5 % (ref 0.0–5.0)
HCT: 41.1 % (ref 36.0–46.0)
Hemoglobin: 13.6 g/dL (ref 12.0–15.0)
Lymphocytes Relative: 24.6 % (ref 12.0–46.0)
Lymphs Abs: 2 K/uL (ref 0.7–4.0)
MCHC: 33.1 g/dL (ref 30.0–36.0)
MCV: 90.5 fl (ref 78.0–100.0)
Monocytes Absolute: 0.4 K/uL (ref 0.1–1.0)
Monocytes Relative: 5.6 % (ref 3.0–12.0)
Neutro Abs: 5.5 K/uL (ref 1.4–7.7)
Neutrophils Relative %: 68.8 % (ref 43.0–77.0)
Platelets: 355 K/uL (ref 150.0–400.0)
RBC: 4.54 Mil/uL (ref 3.87–5.11)
RDW: 13.7 % (ref 11.5–15.5)
WBC: 8 K/uL (ref 4.0–10.5)

## 2023-09-14 LAB — TSH: TSH: 0.28 u[IU]/mL — ABNORMAL LOW (ref 0.35–5.50)

## 2023-09-14 LAB — LIPASE: Lipase: 30 U/L (ref 11.0–59.0)

## 2023-09-14 MED ORDER — PANTOPRAZOLE SODIUM 40 MG PO TBEC: 40.0000 mg | DELAYED_RELEASE_TABLET | Freq: Every day | ORAL | 3 refills | Status: DC

## 2023-09-14 NOTE — Progress Notes (Signed)
 Lauren Hall [id number] [birth year]   Chief Complaint: Abdominal pain, bloating, GERD  Referring Provider: Domenica Harlene LABOR, MD Primary GI MD: Dr. Legrand  HPI: Lauren Hall is a 53 y.o. female with past medical history of anemia, anxiety/depression, Chiari malformation, fibromyalgia, GERD, HTN, hypothyroidism, hysterectomy who presents today for a complaint of GERD and right sided abdominal pain.    Patient last seen in office 11/29/2022 by Dr. Legrand to discuss colonoscopy.  Family history of colon cancer.  She had previously been scheduled for an EGD and colonoscopy in 2018 but was a no-show without cancellation call.  At last visit patient endorsed mild intermittent reflux that she felt did not need medication with no red flag signs, and it was felt she did not need an EGD at that point.  Colonoscopy was performed in outpatient endoscopy lab due to report of some increased possibility of difficult intubation with her known Chiari malformation, and with Mallampati 4 airway on exam.   Patient here today with multiple GI complaints.  States she has been feeling very bloated and has a pinching pain in her RUQ.  Primarily feels bloating in her epigastrium.  Pain is primarily located to RUQ under her right rib cage with radiation occasionally to her back.  She does still have her gallbladder.  Bloating previously improved with Gas-X, but recently Gas-X has not been working.  She has been told by another provider to avoid raw vegetables and instead cook her vegetables to help with digestion.  She enjoys eating raw vegetables and has not followed this advice.  Does notice that things like broccoli, salad, raw cabbage cause worsening symptoms.  Pain does not always occur after eating, can occur at random.  She has some associated nausea but no vomiting.  Sometimes feels nauseated when she has a bowel movement.  In the last few days has had a decreased appetite but did have improvement in her  appetite today and woke up hungry.  States that she has been having bloating and abdominal pain intermittently for years, though has noticed worsening since about October.  Was not having these problems at the time of her last colonoscopy.  She did start taking Trulicity  in November and has recently gone up on her dose of Trulicity .  States that she has been having irregular bowel movements, and in the past has gone up to a week without a bowel movement, but she denies any hard stools or straining.  She has been drinking mushroom coffee which helps her have a bowel movement every few days typically.  Lately has been feeling nauseated and has been eating mostly crackers in order to take her medications.  She does have some relief of her discomfort with belching.  Denies any acid reflux or heartburn, and is not taking any antireflux medications at this time.  She denies any diarrhea, blood in her stool, melena.  Reports that she has just stopped drinking through a straw in the last few days, but this is something that she was doing regularly.  On labs in the last 6 months her TSH has been low.  States she has fluctuating TSH levels, she is on levothyroxine .  She is very concerned about the possibility that her symptoms are due to pancreatic cancer.  She denies any family history of pancreatic cancer or problems with her pancreas in the past.  She has been frequently looking up her symptoms on Google and this has her concerned.  She does tell me  that her husband had necrotizing pancreatitis.  Previous GI Procedures/Imaging   Colonoscopy 05/24/2023 - One diminutive polyp at the ileocecal valve, removed with a cold snare. Resected and retrieved.  - Two small polyps in the sigmoid colon, removed with a hot snare. Resected and retrieved.  - The examination was otherwise normal.   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    Bronchitis 07/27/2011   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   Complication of anesthesia    patient woke up while they were sewing up her incision with breast lumpectomy   Depression    Diabetes (HCC)    type 2   Dysrhythmia    Tachycardia   Enlarged thyroid     Family history of adverse reaction to anesthesia    mom , It would trigger her asthma   Fatigue 10/08/2010   Fibromyalgia 2009   GERD (gastroesophageal reflux disease) 09/26/2013   Headache(784.0) 10/08/2010   Migraines   Hip pain, bilateral 11/06/2010   Hyperglycemia 08/11/2015   normal Hgb A 1C per patient   Hypertension    Hypothyroidism 10/08/2010   Low back pain radiating to right leg 03/11/2015   Other and unspecified hyperlipidemia 07/29/2012   Peripheral neuropathy 11/11/2012   burning sensation mainly right leg   .. pt. denies neuropathy   Poor concentration 01/11/2011   Preventative health care 07/29/2012   S/P laparoscopic hysterectomy 10/07/2015   Sinusitis, acute 07/27/2011   Tachycardia    Thyroid  disease 10/08/2010   Tick bite of flank 11/09/2011   Tinea corporis 11/11/2012   Uterine leiomyoma    Walking pneumonia     Past Surgical History:  Procedure Laterality Date   ABDOMINAL HYSTERECTOMY     BREAST LUMPECTOMY Right 1999   benign    COLONOSCOPY WITH PROPOFOL  N/A 05/24/2023   Procedure: COLONOSCOPY WITH PROPOFOL ;  Surgeon: Legrand Lauren LITTIE DOUGLAS, MD;  Location: [facility];  Service: Gastroenterology;  Laterality: N/A;   CYSTOSCOPY N/A 10/07/2015   Procedure: CYSTOSCOPY;  Surgeon: Gigi Botts, MD;  Location: [facility];  Service: Gynecology;  Laterality: N/A;   FOOT SURGERY Bilateral    Shaved off piece of bone bil baby toes   POLYPECTOMY  05/24/2023   Procedure: POLYPECTOMY, INTESTINE;  Surgeon: Legrand Lauren LITTIE DOUGLAS, MD;  Location: [facility];  Service: Gastroenterology;;    Current Outpatient Medications  Medication Sig Dispense Refill   ALPRAZolam  (XANAX ) 1 MG tablet Take 1 tablet (1 mg total) by  mouth 2 (two) times daily. 60 tablet 2   amLODipine  (NORVASC ) 5 MG tablet Take 1 tablet (5 mg total) by mouth daily. 90 tablet 3   atorvastatin  (LIPITOR) 10 MG tablet Take 0.5 tablets (5 mg total) by mouth daily. 15 tablet 5   Blood Glucose Monitoring Suppl DEVI 1 each by Does not apply route in the morning, at noon, and at bedtime. May substitute to any manufacturer covered by patient's insurance. 1 each 0   busPIRone  (BUSPAR ) 10 MG tablet Take 1 tablet (10 mg total) by mouth 3 (three) times daily. 270 tablet 1   Dulaglutide  (TRULICITY ) 1.5 MG/0.5ML SOAJ INJECT 1.5 MG SUBCUTANEOUSLY ONCE A WEEK 6 mL 3   gabapentin  (NEURONTIN ) 300 MG capsule TAKE 1 CAPSULE BY MOUTH TWICE A DAY AND TAKE 2 CAPSULES BY MOUTH AT BEDTIME 120 capsule 1   Glucose Blood (BLOOD GLUCOSE TEST STRIPS) STRP Check blood sugars 3 times daily 300 each 12  HYDROcodone -acetaminophen  (NORCO/VICODIN) 5-325 MG tablet Take 1 tablet by mouth every 6 (six) hours as needed for moderate pain (pain score 4-6). 60 tablet 0   Lancets (ONETOUCH DELICA PLUS LANCET30G) MISC Check blood sugars 3 times daily 300 each 12   levothyroxine  (SYNTHROID ) 25 MCG tablet 6 days a week skip sundays 90 tablet 1   lisinopril  (ZESTRIL ) 10 MG tablet Take 1 tablet (10 mg total) by mouth in the morning and at bedtime. 180 tablet 0   loratadine (CLARITIN) 10 MG tablet Take 10 mg by mouth daily.     LOW-DOSE ASPIRIN PO      meloxicam  (MOBIC ) 15 MG tablet TAKE 1 TABLET BY MOUTH EVERY DAY AS NEEDED FOR PAIN 30 tablet 3   omeprazole  (PRILOSEC) 40 MG capsule Take 1 capsule (40 mg total) by mouth daily. 90 capsule 0   ondansetron  (ZOFRAN ) 4 MG tablet TAKE 1 TABLET BY MOUTH EVERY 8 HOURS AS NEEDED FOR NAUSEA AND VOMITING 18 tablet 3   tiZANidine  (ZANAFLEX ) 2 MG tablet Take 0.5-2 tablets (1-4 mg total) by mouth every 8 (eight) hours as needed for muscle spasms. 60 tablet 3   venlafaxine  XR (EFFEXOR -XR) 150 MG 24 hr capsule Take 1 capsule (150 mg total) by mouth every  morning. 90 capsule 3   No current facility-administered medications for this visit.    Allergies as of 09/14/2023 - Review Complete 09/14/2023  Allergen Reaction Noted   Promethazine Anaphylaxis 07/27/2011   Promethazine hcl Anaphylaxis 06/02/2021    Family History  Problem Relation Age of Onset   Colon polyps Mother    Fibromyalgia Mother    Diabetes Mother        type 2   Hypertension Mother    Hyperlipidemia Mother    Arthritis Mother    Alcohol abuse Father    Colon cancer Father    Hypertension Brother    Alcohol abuse Brother    Heart disease Maternal Grandmother    Hyperlipidemia Maternal Grandmother    Diabetes Maternal Grandmother        type 2   Colon cancer Maternal Grandmother    Stroke Maternal Grandfather    Diabetes Maternal Grandfather        type 2   Ovarian cancer Paternal Grandmother    Esophageal cancer Paternal Grandfather    Alzheimer's disease Paternal Grandfather    Rectal cancer Neg Hx    Stomach cancer Neg Hx     Social History   Tobacco Use   Smoking status: Never   Smokeless tobacco: Never  Vaping Use   Vaping status: Never Used  Substance Use Topics   Alcohol use: Not Currently    Comment: special occassion- very rarely   Drug use: No     Review of Systems:    Constitutional: No fever, chills, weakness or fatigue Cardiovascular: No chest pain Respiratory: No SOB  Gastrointestinal: See HPI and otherwise negative Hematologic: No bleeding    Physical Exam:  Vital signs: BP 120/66 (BP Location: Left Arm, Patient Position: Sitting, Cuff Size: Normal)   Pulse 80   Ht 4' 11 (1.499 m) Comment: height without shoes  Wt 150 lb 6 oz (68.2 kg)   LMP 09/02/2015 (Approximate) Comment: continous bleeding  BMI 30.37 kg/m   Wt Readings from Last 3 Encounters:  09/14/23 150 lb 6 oz (68.2 kg)  08/18/23 157 lb (71.2 kg)  07/29/23 157 lb (71.2 kg)     Constitutional: Pleasant female in NAD, anxious, alert and cooperative Head:  Normocephalic and atraumatic.  Eyes: No scleral icterus.  Respiratory: Respirations even and unlabored. Lungs clear to auscultation bilaterally.  No wheezes, crackles, or rhonchi.  Cardiovascular:  Regular rate and rhythm. No murmurs. No peripheral edema. Gastrointestinal:  Soft, nondistended, tender to palpation of RUQ. No rebound or guarding. Normal bowel sounds. No appreciable masses or hepatomegaly. Rectal:  Not performed.  Neurologic:  Alert and oriented x4;  grossly normal neurologically.  Skin:   Dry and intact without significant lesions or rashes. Psychiatric: Oriented to person, place and time. Demonstrates good judgement and reason without abnormal affect or behaviors.   RELEVANT LABS AND IMAGING: CBC    Component Value Date/Time   WBC 6.3 07/27/2023 1606   RBC 4.57 07/27/2023 1606   HGB 13.8 07/27/2023 1606   HCT 41.1 07/27/2023 1606   PLT 381.0 07/27/2023 1606   MCV 89.9 07/27/2023 1606   MCH 30.4 01/17/2018 0128   MCHC 33.5 07/27/2023 1606   RDW 13.9 07/27/2023 1606   LYMPHSABS 2.1 07/27/2023 1606   MONOABS 0.4 07/27/2023 1606   EOSABS 0.1 07/27/2023 1606   BASOSABS 0.0 07/27/2023 1606    CMP     Component Value Date/Time   NA 140 07/27/2023 1606   NA 142 10/18/2022 0000   K 4.0 07/27/2023 1606   CL 103 07/27/2023 1606   CO2 31 07/27/2023 1606   GLUCOSE 90 07/27/2023 1606   BUN 5 (L) 07/27/2023 1606   BUN 5 (L) 10/18/2022 0000   CREATININE 0.72 07/27/2023 1606   CREATININE 0.79 09/24/2013 1544   CALCIUM  9.1 07/27/2023 1606   PROT 7.5 07/27/2023 1606   ALBUMIN 4.5 07/27/2023 1606   AST 14 07/27/2023 1606   ALT 11 07/27/2023 1606   ALKPHOS 110 07/27/2023 1606   BILITOT 0.4 07/27/2023 1606   GFRNONAA >60 01/17/2018 0128   GFRAA >60 01/17/2018 0128   Echocardiogram 05/09/2019 1. Left ventricular ejection fraction, by estimation, is 55 to 60% . The left ventricle has normal function. The left ventricle has no regional wall motion abnormalities. There is  mild concentric left ventricular hypertrophy. Left ventricular diastolic parameters are consistent with Grade I diastolic dysfunction ( impaired relaxation).  2. Right ventricular systolic function is normal. The right ventricular size is normal.  3. The mitral valve is normal in structure. No evidence of mitral valve regurgitation. No evidence of mitral stenosis.  4. The aortic valve is tricuspid. Aortic valve regurgitation is not visualized. No aortic stenosis is present.  5. The inferior vena cava is normal in size with greater than 50% respiratory variability, suggesting right atrial pressure of 3 mmHg.  Assessment/Plan:   RUQ abdominal pain Abdominal bloating Heartburn Nausea without vomiting Patient seen today for complaint of right-sided abdominal pain and bloating, which has been ongoing since October/November and has recently worsened.  She has been feeling frequently nauseated but denies any vomiting.  Having fairly regular bowel movements without diarrhea, blood in her stool, melena, straining, hard stools. She did start Trulicity  in November, and recently had an increase in dose.  Question possible side effect of medication. She is very concerned that something might be wrong with her pancreas, states her husband had necrotizing pancreatitis.  Denies any family history of pancreatic cancer, and she herself denies any history of problems with her pancreas. She does have tenderness to palpation of her right upper quadrant on exam today.  Still has her gallbladder.  No recent abdominal imaging.  - Order RUQ US  - Labs today: CBC, CMP,  TSH, lipase - Di[facility]rix H. Pylori - 6-week trial of Protonix  40 mg daily - Discussed low FODMAP diet and reducing intake of foods that cause increased gas and bloating - Advised patient to discuss with her PCP regarding possibly decreasing her dose of Trulicity  to see if this improves her symptoms, or coming off the medication completely - If above workup  unremarkable and symptoms persist, consider CT A/P versus EGD - ER precautions given   Camie Furbish, PA-C [facility] 09/14/2023, 4:01 PM  Patient Care Team: Domenica Harlene LABOR, MD as PCP - General (Family Medicine) Tobb, Kardie, DO as PCP - Cardiology (Cardiology)

## 2023-09-14 NOTE — Patient Instructions (Addendum)
 You have been scheduled for an abdominal ultrasound at [facility] (1st floor of hospital) on 09/19/23 at 9:30 am . Please arrive 30 minutes prior to your appointment for registration. Make certain not to have anything to eat or drink 6 hours prior to your appointment. Should you need to reschedule your appointment, please contact radiology at [phone]. This test typically takes about 30 minutes to perform.  Your provider has requested that you go to the basement level for lab work before leaving today. Press B on the elevator. The lab is located at the first door on the left as you exit the elevator.   Your provider has ordered [facility] stool testing for you. You have received a kit from our office today containing all necessary supplies to complete this test. Please carefully read the stool collection instructions provided in the kit before opening the accompanying materials. In addition, be sure there is a label providing your full name and date of birth on the puritan opti-swab tube that is supplied in the kit (if you do not see a label with this information on your test tube, please make us  aware before test collection!). After completing the test, you should secure the purtian tube into the specimen biohazard bag. The [facility] E-Req sheet (including date and time of specimen collection) should be placed into the outside pocket of the specimen biohazard bag and returned to the [place] lab (basement floor of [facility]) within 3 days of collection. Please make sure to give the specimen to a staff member at the lab. DO NOT leave the specimen on the counter.   If the specimen date and time (can be found in the upper right boxed portion of the sheet) are not filled out on the E-Req sheet, the test will NOT be performed.   We have sent the following medications to your pharmacy for you to pick up at your convenience: Pantoprazole     Due to recent  changes in healthcare laws, you may see the results of your imaging and laboratory studies on MyChart before your provider has had a chance to review them.  We understand that in some cases there may be results that are confusing or concerning to you. Not all laboratory results come back in the same time frame and the provider may be waiting for multiple results in order to interpret others.  Please give us  48 hours in order for your provider to thoroughly review all the results before contacting the office for clarification of your results.   Follow-up on : 10/20/23 at 1:30 pm with Camie Furbish, PA-C   Thank you for choosing me and [facility].  Camie Furbish, PA-C

## 2023-09-15 ENCOUNTER — Encounter: Payer: Self-pay | Admitting: Gastroenterology

## 2023-09-15 ENCOUNTER — Other Ambulatory Visit: Payer: Self-pay | Admitting: Family

## 2023-09-15 MED ORDER — TRULICITY 0.75 MG/0.5ML ~~LOC~~ SOAJ
0.7500 mg | SUBCUTANEOUS | 1 refills | Status: DC
Start: 2023-09-15 — End: 2023-10-20

## 2023-09-16 ENCOUNTER — Ambulatory Visit: Payer: Self-pay | Admitting: Gastroenterology

## 2023-09-17 DIAGNOSIS — F332 Major depressive disorder, recurrent severe without psychotic features: Secondary | ICD-10-CM | POA: Diagnosis not present

## 2023-09-19 ENCOUNTER — Ambulatory Visit (HOSPITAL_COMMUNITY)
Admission: RE | Admit: 2023-09-19 | Discharge: 2023-09-19 | Disposition: A | Discharge status: Home / Self Care | Source: Ambulatory Visit | Attending: Gastroenterology | Admitting: Gastroenterology

## 2023-09-19 ENCOUNTER — Telehealth: Payer: Self-pay | Admitting: Gastroenterology

## 2023-09-19 DIAGNOSIS — R110 Nausea: Secondary | ICD-10-CM | POA: Insufficient documentation

## 2023-09-19 DIAGNOSIS — R1011 Right upper quadrant pain: Secondary | ICD-10-CM | POA: Insufficient documentation

## 2023-09-19 DIAGNOSIS — R12 Heartburn: Secondary | ICD-10-CM | POA: Insufficient documentation

## 2023-09-19 DIAGNOSIS — R140 Abdominal distension (gaseous): Secondary | ICD-10-CM | POA: Diagnosis not present

## 2023-09-19 DIAGNOSIS — R1110 Vomiting, unspecified: Secondary | ICD-10-CM | POA: Diagnosis not present

## 2023-09-19 NOTE — Telephone Encounter (Signed)
 Patient informed of results.

## 2023-09-19 NOTE — Telephone Encounter (Signed)
 [facility] H. pylori results received.  Negative for H. pylori 09/17/2023.

## 2023-09-20 ENCOUNTER — Telehealth: Payer: Self-pay | Admitting: *Deleted

## 2023-09-20 NOTE — Telephone Encounter (Signed)
Pt appt changed to 9/18

## 2023-09-20 NOTE — Telephone Encounter (Signed)
 Copied from CRM #[id number]. Topic: Clinical - Lab/Test Results >> Sep 19, 2023  9:43 AM [place] MATSU wrote: Reason for CRM:  Patient called in to advise that Dr. Arletta did lab work and advise patient to follow up with PCP regarding her abnormal results and is concerned about being low. Per patient Dr. Arletta is wanting patient to be seen however soonest appointment is 11/17/23.   May you please assist.

## 2023-09-21 ENCOUNTER — Encounter: Payer: Self-pay | Admitting: Family Medicine

## 2023-09-21 NOTE — Progress Notes (Signed)
 ____________________________________________________________  Attending physician addendum:  Thank you for sending this case to me. I have reviewed the entire note and agree with the plan.  The abdominal pain and bloating sound most likely from increased intestinal gas with an element of maldigestion.  Not typical for biliary colic and doubt she has pancreatic cancer.  Victory Brand, MD  ____________________________________________________________

## 2023-09-21 NOTE — Discharge Instructions (Signed)

## 2023-09-22 ENCOUNTER — Inpatient Hospital Stay
Admission: RE | Admit: 2023-09-22 | Discharge: 2023-09-22 | Disposition: A | Discharge status: Home / Self Care | Source: Ambulatory Visit | Attending: Family Medicine | Admitting: Family Medicine
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
## 2023-09-28 ENCOUNTER — Encounter: Payer: Self-pay | Admitting: Family Medicine

## 2023-09-28 ENCOUNTER — Other Ambulatory Visit (INDEPENDENT_AMBULATORY_CARE_PROVIDER_SITE_OTHER)

## 2023-09-28 ENCOUNTER — Ambulatory Visit (INDEPENDENT_AMBULATORY_CARE_PROVIDER_SITE_OTHER): Admitting: Family Medicine

## 2023-09-28 ENCOUNTER — Encounter: Payer: Self-pay | Admitting: Gastroenterology

## 2023-09-28 ENCOUNTER — Ambulatory Visit: Payer: Self-pay | Admitting: Family Medicine

## 2023-09-28 VITALS — BP 120/74 | HR 85 | Ht 59.0 in | Wt 155.0 lb

## 2023-09-28 DIAGNOSIS — M9903 Segmental and somatic dysfunction of lumbar region: Secondary | ICD-10-CM

## 2023-09-28 DIAGNOSIS — E039 Hypothyroidism, unspecified: Secondary | ICD-10-CM

## 2023-09-28 DIAGNOSIS — M461 Sacroiliitis, not elsewhere classified: Secondary | ICD-10-CM | POA: Diagnosis not present

## 2023-09-28 DIAGNOSIS — M5412 Radiculopathy, cervical region: Secondary | ICD-10-CM | POA: Diagnosis not present

## 2023-09-28 DIAGNOSIS — M9902 Segmental and somatic dysfunction of thoracic region: Secondary | ICD-10-CM | POA: Diagnosis not present

## 2023-09-28 DIAGNOSIS — M9904 Segmental and somatic dysfunction of sacral region: Secondary | ICD-10-CM | POA: Diagnosis not present

## 2023-09-28 LAB — T3, FREE: T3, Free: 3.7 pg/mL (ref 2.3–4.2)

## 2023-09-28 LAB — T4, FREE: Free T4: 0.77 ng/dL (ref 0.60–1.60)

## 2023-09-28 NOTE — Telephone Encounter (Addendum)
 Pt has lab appt today for labs and she will discuss at appt on 9/18

## 2023-09-28 NOTE — Assessment & Plan Note (Signed)
 Cervical pain, discussed icing regimen and home exercises, discussed which activities to do and which ones to avoid.  Increase activity slowly.  Discussed icing regimen.  Increase activity slowly.  I would like to see how patient responds to the epidural.  Did muscle energy today.  Follow-up again in 6 to 8 weeks.

## 2023-09-28 NOTE — Patient Instructions (Signed)
 Good to see you Get epidural See me in 7 weeks

## 2023-09-28 NOTE — Assessment & Plan Note (Signed)
 Continued aggravation noted.  Discussed icing regimen and home exercises, discussed which activities to do and which ones to avoid.  Increase activity slowly.  Discussed icing regimen.  Follow-up again in 6 to 8 weeks

## 2023-09-29 ENCOUNTER — Encounter: Payer: Self-pay | Admitting: Family Medicine

## 2023-09-29 ENCOUNTER — Other Ambulatory Visit: Payer: Self-pay

## 2023-09-29 DIAGNOSIS — R140 Abdominal distension (gaseous): Secondary | ICD-10-CM

## 2023-09-29 DIAGNOSIS — R1011 Right upper quadrant pain: Secondary | ICD-10-CM

## 2023-09-29 LAB — THYROID PEROXIDASE ANTIBODY: Thyroperoxidase Ab SerPl-aCnc: 1 [IU]/mL (ref <9)

## 2023-09-29 NOTE — Progress Notes (Signed)
 The pt has been advised  She will follow a low fat diet She does have abd pain and bloating and would like to proceed with CT- order has been entered and sent to the schedulers

## 2023-10-04 ENCOUNTER — Other Ambulatory Visit: Payer: Self-pay | Admitting: Family Medicine

## 2023-10-04 DIAGNOSIS — F332 Major depressive disorder, recurrent severe without psychotic features: Secondary | ICD-10-CM | POA: Diagnosis not present

## 2023-10-04 NOTE — Telephone Encounter (Unsigned)
 Copied from CRM [id number]. Topic: Clinical - Medication Refill >> Oct 04, 2023  1:32 PM Franky GRADE wrote: Medication: HYDROcodone -acetaminophen  (NORCO/VICODIN) 5-325 MG tablet [[id number]]  Has the patient contacted their pharmacy? Yes, they asked patient to contact the office.  (Agent: If no, request that the patient contact the pharmacy for the refill. If patient does not wish to contact the pharmacy document the reason why and proceed with request.) (Agent: If yes, when and what did the pharmacy advise?)  This is the patient's preferred pharmacy:  [id number] - [place], [place] - [address] AT [facility] [address] [place] [address] Phone: [phone] Fax: [phone]  Is this the correct pharmacy for this prescription? Yes If no, delete pharmacy and type the correct one.   Has the prescription been filled recently? No  Is the patient out of the medication? Yes  Has the patient been seen for an appointment in the last year OR does the patient have an upcoming appointment? Yes  Can we respond through MyChart? Yes  Agent: Please be advised that Rx refills may take up to 3 business days. We ask that you follow-up with your pharmacy.

## 2023-10-04 NOTE — Telephone Encounter (Signed)
 Requesting: hydrocodone  5-325mg   Contract: None LID:[id number] Last Visit: 07/28/23 Next Visit: 10/20/23 Last Refill: 08/30/23 #60 and 0RF   Please Advise

## 2023-10-05 MED ORDER — HYDROCODONE-ACETAMINOPHEN 5-325 MG PO TABS: 1.0000 | ORAL_TABLET | Freq: Four times a day (QID) | ORAL | 0 refills | Status: DC | PRN

## 2023-10-05 NOTE — Discharge Instructions (Signed)

## 2023-10-06 ENCOUNTER — Ambulatory Visit
Admission: RE | Admit: 2023-10-06 | Discharge: 2023-10-06 | Disposition: A | Discharge status: Home / Self Care | Source: Ambulatory Visit | Attending: Family Medicine | Admitting: Family Medicine

## 2023-10-06 DIAGNOSIS — M5412 Radiculopathy, cervical region: Secondary | ICD-10-CM

## 2023-10-06 DIAGNOSIS — M4722 Other spondylosis with radiculopathy, cervical region: Secondary | ICD-10-CM | POA: Diagnosis not present

## 2023-10-06 MED ORDER — IOPAMIDOL (ISOVUE-M 300) INJECTION 61%
1.0000 mL | Freq: Once | INTRAMUSCULAR | Status: AC | PRN
  Administered 2023-10-06: 1 mL via EPIDURAL

## 2023-10-06 MED ORDER — TRIAMCINOLONE ACETONIDE 40 MG/ML IJ SUSP (RADIOLOGY)
60.0000 mg | Freq: Once | INTRAMUSCULAR | Status: AC
  Administered 2023-10-06: 60 mg via EPIDURAL

## 2023-10-11 ENCOUNTER — Other Ambulatory Visit: Payer: Self-pay | Admitting: Family Medicine

## 2023-10-11 ENCOUNTER — Other Ambulatory Visit: Payer: Self-pay | Admitting: Cardiology

## 2023-10-11 DIAGNOSIS — M79604 Pain in right leg: Secondary | ICD-10-CM

## 2023-10-13 ENCOUNTER — Ambulatory Visit (HOSPITAL_COMMUNITY)
Admission: RE | Admit: 2023-10-13 | Discharge: 2023-10-13 | Disposition: A | Discharge status: Home / Self Care | Source: Ambulatory Visit | Attending: Gastroenterology | Admitting: Gastroenterology

## 2023-10-13 DIAGNOSIS — R935 Abnormal findings on diagnostic imaging of other abdominal regions, including retroperitoneum: Secondary | ICD-10-CM | POA: Diagnosis not present

## 2023-10-13 DIAGNOSIS — R140 Abdominal distension (gaseous): Secondary | ICD-10-CM | POA: Insufficient documentation

## 2023-10-13 DIAGNOSIS — K5730 Diverticulosis of large intestine without perforation or abscess without bleeding: Secondary | ICD-10-CM | POA: Diagnosis not present

## 2023-10-13 DIAGNOSIS — R109 Unspecified abdominal pain: Secondary | ICD-10-CM | POA: Diagnosis not present

## 2023-10-13 DIAGNOSIS — R1011 Right upper quadrant pain: Secondary | ICD-10-CM | POA: Insufficient documentation

## 2023-10-13 MED ORDER — IOHEXOL 300 MG/ML  SOLN
100.0000 mL | Freq: Once | INTRAMUSCULAR | Status: AC | PRN
  Administered 2023-10-13: 100 mL via INTRAVENOUS

## 2023-10-16 NOTE — Assessment & Plan Note (Signed)
 Well controlled, no changes to meds. Encouraged heart healthy diet such as the DASH diet and exercise as tolerated. Tolerating Atorvastatin

## 2023-10-16 NOTE — Assessment & Plan Note (Signed)
 On Levothyroxine, continue to monitor

## 2023-10-16 NOTE — Assessment & Plan Note (Signed)
 On Venlafaxine

## 2023-10-16 NOTE — Assessment & Plan Note (Signed)
 Well controlled, no changes to meds. Encouraged heart healthy diet such as the DASH diet and exercise as tolerated.

## 2023-10-16 NOTE — Assessment & Plan Note (Signed)
 hgba1c acceptable, minimize simple carbs. Increase exercise as tolerated. Continue current meds

## 2023-10-17 ENCOUNTER — Ambulatory Visit: Payer: Self-pay | Admitting: Gastroenterology

## 2023-10-17 DIAGNOSIS — R9389 Abnormal findings on diagnostic imaging of other specified body structures: Secondary | ICD-10-CM

## 2023-10-17 DIAGNOSIS — R1011 Right upper quadrant pain: Secondary | ICD-10-CM

## 2023-10-17 NOTE — Telephone Encounter (Signed)
 Received confirmation from provider. MRI should be with IV contrast only. Orders placed. Message sent to radiology schedulers to get procedure scheduled.

## 2023-10-19 DIAGNOSIS — F332 Major depressive disorder, recurrent severe without psychotic features: Secondary | ICD-10-CM | POA: Diagnosis not present

## 2023-10-20 ENCOUNTER — Encounter: Payer: Self-pay | Admitting: Gastroenterology

## 2023-10-20 ENCOUNTER — Ambulatory Visit: Admitting: Family Medicine

## 2023-10-20 ENCOUNTER — Other Ambulatory Visit: Payer: Self-pay | Admitting: Family Medicine

## 2023-10-20 ENCOUNTER — Ambulatory Visit: Admitting: Gastroenterology

## 2023-10-20 ENCOUNTER — Encounter: Payer: Self-pay | Admitting: Family Medicine

## 2023-10-20 ENCOUNTER — Other Ambulatory Visit (HOSPITAL_COMMUNITY): Payer: Self-pay

## 2023-10-20 ENCOUNTER — Telehealth: Payer: Self-pay

## 2023-10-20 VITALS — BP 124/74 | HR 104 | Ht 59.0 in | Wt 153.4 lb

## 2023-10-20 VITALS — BP 130/80 | HR 84 | Resp 16 | Ht 59.0 in | Wt 153.2 lb

## 2023-10-20 DIAGNOSIS — R1013 Epigastric pain: Secondary | ICD-10-CM | POA: Diagnosis not present

## 2023-10-20 DIAGNOSIS — R140 Abdominal distension (gaseous): Secondary | ICD-10-CM

## 2023-10-20 DIAGNOSIS — K5900 Constipation, unspecified: Secondary | ICD-10-CM

## 2023-10-20 DIAGNOSIS — E782 Mixed hyperlipidemia: Secondary | ICD-10-CM | POA: Diagnosis not present

## 2023-10-20 DIAGNOSIS — F418 Other specified anxiety disorders: Secondary | ICD-10-CM | POA: Diagnosis not present

## 2023-10-20 DIAGNOSIS — Z7985 Long-term (current) use of injectable non-insulin antidiabetic drugs: Secondary | ICD-10-CM

## 2023-10-20 DIAGNOSIS — E785 Hyperlipidemia, unspecified: Secondary | ICD-10-CM

## 2023-10-20 DIAGNOSIS — I10 Essential (primary) hypertension: Secondary | ICD-10-CM

## 2023-10-20 DIAGNOSIS — R1310 Dysphagia, unspecified: Secondary | ICD-10-CM

## 2023-10-20 DIAGNOSIS — R9389 Abnormal findings on diagnostic imaging of other specified body structures: Secondary | ICD-10-CM

## 2023-10-20 DIAGNOSIS — E1169 Type 2 diabetes mellitus with other specified complication: Secondary | ICD-10-CM | POA: Diagnosis not present

## 2023-10-20 DIAGNOSIS — K869 Disease of pancreas, unspecified: Secondary | ICD-10-CM

## 2023-10-20 DIAGNOSIS — E039 Hypothyroidism, unspecified: Secondary | ICD-10-CM

## 2023-10-20 MED ORDER — TIRZEPATIDE 2.5 MG/0.5ML ~~LOC~~ SOAJ: 2.5000 mg | SUBCUTANEOUS | 1 refills | Status: DC

## 2023-10-20 NOTE — Progress Notes (Signed)
 Lauren Hall [id number] February 25, 1970   Chief Complaint: Abdominal pain, bloating  Referring Provider: Domenica Harlene LABOR, MD Primary GI MD: Dr. Legrand  HPI: Lauren Hall is a 53 y.o. female with past medical history of anemia, anxiety/depression, Chiari malformation, fibromyalgia, GERD, HTN, hypothyroidism, hysterectomy who presents today for follow up.  Seen in office 11/29/2022 by Dr. Legrand to discuss colonoscopy. Family history of colon cancer. She had previously been scheduled for an EGD and colonoscopy in 2018 but was a no-show without cancellation call. At last visit patient endorsed mild intermittent reflux that she felt did not need medication with no red flag signs, and it was felt she did not need an EGD at that point.   Colonoscopy was performed in outpatient endoscopy lab due to report of some increased possibility of difficult intubation with her known Chiari malformation, and with Mallampati 4 airway on exam.   At last visit 09/14/2023 patient reported right-sided abdominal pain and bloating ongoing since October/November which had recently worsened.  Endorsed frequent nausea without vomiting.  No change in bowel movements.  Did start Trulicity  in November with recent increase in dose.  She was very concerned about the possibility of pancreatic cancer.  Was tender to palpation of RUQ.  Labs 09/14/2023: Normal CBC, TSH 0.28, normal lipase 30, normal CMP  Tested negative for H. pylori 09/17/2023.  RUQ US  09/19/2023 showed mildly increased echotexture of the liver and otherwise no acute abnormality.  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  No acute abnormality.  She is scheduled for MRI with pancreatic protocol on 10/27/2023.  Seen today by PCP.  Thyroid  ultrasound has been ordered.   Patient states her gas and bloating has improved but is still there.  Continues  to have intermittent epigastric pains.  Has been following low FODMAP diet, taking Protonix , and has also stopped Trulicity .  She will continue to be off of Trulicity  for 2 more weeks and plan is to eventually start Mounjaro .  At last visit, felt gas and bloating were so bad that it was coming up her throat.  She has noticed improvement with elimination of certain foods such as cabbage, broccoli, another vegetables which she used to eat in large quantities.  She is disappointed that these foods cause her so much trouble but is willing to keep them out of her diet or eat in smaller quantities if they are causing her gas and bloating.  She has also been drinking more water and stopped drinking out of straws.  Previously had decreased appetite and was unable to eat due to excessive bloating.  When gas and bloating were at their worst, she was also having trouble with constipation and having to take a laxative.  States that now her bowel movements seem to be back to normal and denies any blood in her stool or melena.  Not having any diarrhea either.  Nausea and vomiting has improved.  Still having belching and flatulence.  She also stopped drinking mushroom coffee which she enjoyed because it helped improve her brain fog.  Has added that back in without any worsening of symptoms.  She has been taking Protonix  40 mg daily.  States that she has an enlarged thyroid  (scheduled for thyroid  ultrasound) and sometimes has a hard time swallowing pills but no difficulty swallowing food or liquid and nothing is getting stuck in her throat.  Denies acid reflux or  heartburn.  Previous GI Procedures/Imaging   CT A/P 10/13/2023 1. No acute abnormality in the abdomen or pelvis. 2. Scattered colonic diverticulosis without evidence of acute diverticulitis. 3. Trace pelvic free fluid, nonspecific. 4. 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 Consider further  evaluation with nonemergent pancreatic protocol MRI with and without contrast.  RUQ US  09/19/2023 1. No acute abnormality identified. 2. Mildly increased echotexture of the liver, nonspecific but can be seen in fatty infiltration of liver.  Colonoscopy 05/24/2023 - One diminutive polyp at the ileocecal valve, removed with a cold snare. Resected and retrieved.  - Two small polyps in the sigmoid colon, removed with a hot snare. Resected and retrieved.  - The examination was otherwise normal.   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   Complication of anesthesia    patient woke up while they were sewing up her incision with breast lumpectomy   Depression    Diabetes (HCC)    type 2   Dysrhythmia    Tachycardia   Enlarged thyroid     Family history of adverse reaction to anesthesia    mom , It would trigger her asthma   Fatigue 10/08/2010   Fibromyalgia 2009   GERD (gastroesophageal reflux disease) 09/26/2013   Headache(784.0) 10/08/2010   Migraines   Hip pain, bilateral 11/06/2010   Hyperglycemia 08/11/2015   normal Hgb A 1C per patient   Hypertension    Hypothyroidism 10/08/2010   Low back pain radiating to right leg 03/11/2015   Other and unspecified hyperlipidemia 07/29/2012   Peripheral neuropathy 11/11/2012   burning sensation mainly right leg   .. pt. denies neuropathy   Poor concentration 01/11/2011   Preventative health care 07/29/2012   S/P laparoscopic hysterectomy 10/07/2015   Sinusitis, acute 07/27/2011   Tachycardia    Thyroid  disease 10/08/2010   Tick bite of flank 11/09/2011   Tinea corporis 11/11/2012   Uterine leiomyoma    Walking pneumonia     Past Surgical History:  Procedure Laterality Date   ABDOMINAL HYSTERECTOMY     BREAST LUMPECTOMY Right 1999   benign    COLONOSCOPY WITH PROPOFOL  N/A 05/24/2023   Procedure:  COLONOSCOPY WITH PROPOFOL ;  Surgeon: Legrand Victory LITTIE DOUGLAS, MD;  Location: [facility];  Service: Gastroenterology;  Laterality: N/A;   CYSTOSCOPY N/A 10/07/2015   Procedure: CYSTOSCOPY;  Surgeon: Gigi Botts, MD;  Location: [facility];  Service: Gynecology;  Laterality: N/A;   FOOT SURGERY Bilateral    Shaved off piece of bone bil baby toes   POLYPECTOMY  05/24/2023   Procedure: POLYPECTOMY, INTESTINE;  Surgeon: Legrand Victory LITTIE DOUGLAS, MD;  Location: [facility];  Service: Gastroenterology;;    Current Outpatient Medications  Medication Sig Dispense Refill   ALPRAZolam  (XANAX ) 1 MG tablet Take 1 tablet (1 mg total) by mouth 2 (two) times daily. 60 tablet 2   amLODipine  (NORVASC ) 5 MG tablet TAKE 1 TABLET (5 MG TOTAL) BY MOUTH DAILY. 30 tablet 11   atorvastatin  (LIPITOR) 10 MG tablet Take 0.5 tablets (5 mg total) by mouth daily. 15 tablet 5   Blood Glucose Monitoring Suppl DEVI 1 each by Does not apply route in the morning, at noon, and at bedtime. May substitute to any manufacturer covered by patient's insurance. 1 each 0   busPIRone  (BUSPAR ) 10 MG tablet  Take 1 tablet (10 mg total) by mouth 3 (three) times daily. 270 tablet 1   diclofenac Sodium (VOLTAREN) 1 % GEL Apply 2 g topically as needed.     gabapentin  (NEURONTIN ) 300 MG capsule Take 1 capsule (300 mg total) by mouth 2 (two) times daily AND 2 capsules (600 mg total) at bedtime. 120 capsule 1   Glucose Blood (BLOOD GLUCOSE TEST STRIPS) STRP Check blood sugars 3 times daily 300 each 12   HYDROcodone -acetaminophen  (NORCO/VICODIN) 5-325 MG tablet Take 1 tablet by mouth every 6 (six) hours as needed for moderate pain (pain score 4-6). 60 tablet 0   Lancets (ONETOUCH DELICA PLUS LANCET30G) MISC Check blood sugars 3 times daily 300 each 12   levothyroxine  (SYNTHROID ) 25 MCG tablet 6 days a week skip sundays 90 tablet 1   lisinopril  (ZESTRIL ) 10 MG tablet Take 1 tablet (10 mg total) by mouth in the morning and at bedtime. 180 tablet 0   loratadine  (CLARITIN) 10 MG tablet Take 10 mg by mouth daily.     LOW-DOSE ASPIRIN PO      meloxicam  (MOBIC ) 15 MG tablet TAKE 1 TABLET BY MOUTH EVERY DAY AS NEEDED FOR PAIN 30 tablet 3   ondansetron  (ZOFRAN ) 4 MG tablet TAKE 1 TABLET BY MOUTH EVERY 8 HOURS AS NEEDED FOR NAUSEA AND VOMITING 18 tablet 3   pantoprazole  (PROTONIX ) 40 MG tablet Take 1 tablet (40 mg total) by mouth daily. 30 tablet 3   tirzepatide  (MOUNJARO ) 2.5 MG/0.5ML Pen Inject 2.5 mg into the skin once a week. 2 mL 1   tiZANidine  (ZANAFLEX ) 2 MG tablet Take 0.5-2 tablets (1-4 mg total) by mouth every 8 (eight) hours as needed for muscle spasms. 60 tablet 3   venlafaxine  XR (EFFEXOR -XR) 150 MG 24 hr capsule Take 1 capsule (150 mg total) by mouth every morning. 90 capsule 3   No current facility-administered medications for this visit.    Allergies as of 10/20/2023 - Review Complete 10/20/2023  Allergen Reaction Noted   Promethazine Anaphylaxis 07/27/2011   Promethazine hcl Anaphylaxis 06/02/2021    Family History  Problem Relation Age of Onset   Colon polyps Mother    Fibromyalgia Mother    Diabetes Mother        type 2   Hypertension Mother    Hyperlipidemia Mother    Arthritis Mother    Alcohol abuse Father    Colon cancer Father    Hypertension Brother    Alcohol abuse Brother    Heart disease Maternal Grandmother    Hyperlipidemia Maternal Grandmother    Diabetes Maternal Grandmother        type 2   Colon cancer Maternal Grandmother    Stroke Maternal Grandfather    Diabetes Maternal Grandfather        type 2   Ovarian cancer Paternal Grandmother    Esophageal cancer Paternal Grandfather    Alzheimer's disease Paternal Grandfather    Rectal cancer Neg Hx    Stomach cancer Neg Hx     Social History   Tobacco Use   Smoking status: Never   Smokeless tobacco: Never  Vaping Use   Vaping status: Never Used  Substance Use Topics   Alcohol use: Not Currently    Comment: special occassion- very rarely   Drug  use: No     Review of Systems:    Constitutional: No unexplained weight loss, fever, chill Cardiovascular: No chest pain Respiratory: No SOB  Gastrointestinal: See HPI and otherwise negative  Hematologic: No bleeding     Physical Exam:  Vital signs: BP 124/74 (BP Location: Left Arm, Patient Position: Sitting, Cuff Size: Normal)   Pulse (!) 104   Ht 4' 11 (1.499 m)   Wt 153 lb 6 oz (69.6 kg)   LMP 09/02/2015 (Approximate) Comment: continous bleeding  BMI 30.98 kg/m   Constitutional: Pleasant, overweight female in NAD, alert and cooperative Head:  Normocephalic and atraumatic.  Eyes: No scleral icterus.  Respiratory: Respirations even and unlabored. Lungs clear to auscultation bilaterally.  No wheezes, crackles, or rhonchi.  Cardiovascular:  Regular rate and rhythm. No murmurs. No peripheral edema. Gastrointestinal:  Soft, mildly distended,nontender. No rebound or guarding. Normal bowel sounds. No appreciable masses or hepatomegaly. Rectal:  Not performed.  Neurologic:  Alert and oriented x4;  grossly normal neurologically.  Skin:   Dry and intact without significant lesions or rashes. Psychiatric: Oriented to person, place and time. Demonstrates good judgement and reason without abnormal affect or behaviors.   RELEVANT LABS AND IMAGING: CBC    Component Value Date/Time   WBC 8.0 09/14/2023 1643   RBC 4.54 09/14/2023 1643   HGB 13.6 09/14/2023 1643   HCT 41.1 09/14/2023 1643   PLT 355.0 09/14/2023 1643   MCV 90.5 09/14/2023 1643   MCH 30.4 01/17/2018 0128   MCHC 33.1 09/14/2023 1643   RDW 13.7 09/14/2023 1643   LYMPHSABS 2.0 09/14/2023 1643   MONOABS 0.4 09/14/2023 1643   EOSABS 0.0 09/14/2023 1643   BASOSABS 0.0 09/14/2023 1643    CMP     Component Value Date/Time   NA 139 09/14/2023 1643   NA 142 10/18/2022 0000   K 3.8 09/14/2023 1643   CL 100 09/14/2023 1643   CO2 33 (H) 09/14/2023 1643   GLUCOSE 94 09/14/2023 1643   BUN 7 09/14/2023 1643   BUN 5 (L)  10/18/2022 0000   CREATININE 0.84 09/14/2023 1643   CREATININE 0.79 09/24/2013 1544   CALCIUM  9.0 09/14/2023 1643   PROT 7.4 09/14/2023 1643   ALBUMIN 4.4 09/14/2023 1643   AST 12 09/14/2023 1643   ALT 12 09/14/2023 1643   ALKPHOS 97 09/14/2023 1643   BILITOT 0.3 09/14/2023 1643   GFRNONAA >60 01/17/2018 0128   GFRAA >60 01/17/2018 0128     Assessment/Plan:   Epigastric abdominal pain Abdominal bloating Constipation Abnormal CT findings Patient seen today for follow-up of abdominal pain and bloating.  At last visit had endorsed ongoing bloating and pain since October/November which had recently worsened.  Had started Trulicity  in November with recent increase in dose.  Was tender to palpation of RUQ.  RUQ US  showed mildly increased echotexture of the liver and otherwise no acute abnormality.  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  No acute abnormality. She is scheduled for MRI with pancreatic protocol on 10/27/2023.  Today states she has noticed significant improvement in her abdominal bloating since last visit.  Has been following a low FODMAP diet, no longer drinking from straws, has been taking Protonix  40 mg daily, and stopped Trulicity .  Plans to be off Trulicity  another 2 weeks, then start on Mounjaro .  We discussed that she should be aware of any worsening or recurrence of symptoms when she starts Mounjaro .  Encouraged her to continue with low FODMAP diet as this seems to have also been helping.  She does continue to have intermittent epigastric pain.  Not having any acid reflux  or heartburn.  Sometimes has trouble swallowing pills but no difficulty with food or liquids and nothing is getting stuck in her throat.  Reports she has an enlarged thyroid  and will be getting a thyroid  ultrasound soon.  - Take Protonix  40 mg every other day for a week, then stop - Can try Phazyme for gas/bloating -  Start fiber 1 tablespoon daily, may also add MiraLAX as needed for constipation (noted to be on hydrocodone  chronically) - Continue low FODMAP diet - Will follow MRI results    Camie Furbish, PA-C  Gastroenterology 10/20/2023, 1:36 PM  Patient Care Team: Domenica Harlene LABOR, MD as PCP - General (Family Medicine) Tobb, Kardie, DO as PCP - Cardiology (Cardiology)

## 2023-10-20 NOTE — Patient Instructions (Addendum)
 NOW Company probiotic at [employer] or at [employer]   Abdominal Pain, Adult  Pain in the abdomen (abdominal pain) can be caused by many things. In most cases, it gets better with no treatment or by being treated at home. But in some cases, it can be serious. Your health care provider will ask questions about your medical history and do a physical exam to try to figure out what is causing your pain. Follow these instructions at home: Medicines Take over-the-counter and prescription medicines only as told by your provider. Do not take medicines that help you poop (laxatives) unless told by your provider. General instructions Watch your condition for any changes. Drink enough fluid to keep your pee (urine) pale yellow. Contact a health care provider if: Your pain changes, gets worse, or lasts longer than expected. You have severe cramping or bloating in your abdomen, or you vomit. Your pain gets worse with meals, after eating, or with certain foods. You are constipated or have diarrhea for more than 2-3 days. You are not hungry, or you lose weight without trying. You have signs of dehydration. These may include: Dark pee, very little pee, or no pee. Cracked lips or dry mouth. Sleepiness or weakness. You have pain when you pee (urinate) or poop. Your abdominal pain wakes you up at night. You have blood in your pee. You have a fever. Get help right away if: You cannot stop vomiting. Your pain is only in one part of the abdomen. Pain on the right side could be caused by appendicitis. You have bloody or black poop (stool), or poop that looks like tar. You have trouble breathing. You have chest pain. These symptoms may be an emergency. Get help right away. Call 911. Do not wait to see if the symptoms will go away. Do not drive yourself to the hospital. This information is not intended to replace advice given to you by your health care provider. Make sure you discuss any questions  you have with your health care provider. Document Revised: 11/04/2021 Document Reviewed: 11/04/2021 Elsevier Patient Education  2024 [employer].

## 2023-10-20 NOTE — Telephone Encounter (Signed)
 Pharmacy Patient Advocate Encounter   Received notification from RX Request Messages that prior authorization for Mounjaro  2.5mg /0.83ml is required/requested.   Insurance verification completed.   The patient is insured through [facility] .   Per test claim: PA required; PA submitted to above mentioned insurance via Latent Key/confirmation #/[id number] Status is pending

## 2023-10-20 NOTE — Patient Instructions (Signed)
 Take Protonix  every other day for a week, then stop.   Continue low FODMAP diet.   Start Benefiber, work up to 1 tbsp daily.   You can try OTC phazyme for gas.   Start Miralax 1 capful daily in 8 ounces of liquid for constipation.   _______________________________________________________  If your blood pressure at your visit was 140/90 or greater, please contact your primary care physician to follow up on this.  _______________________________________________________  If you are age 47 or older, your body mass index should be between 23-30. Your Body mass index is 30.98 kg/m. If this is out of the aforementioned range listed, please consider follow up with your Primary Care Provider.  If you are age 103 or younger, your body mass index should be between 19-25. Your Body mass index is 30.98 kg/m. If this is out of the aformentioned range listed, please consider follow up with your Primary Care Provider.   ________________________________________________________  The [facility] providers would like to encourage you to use MYCHART to communicate with providers for non-urgent requests or questions.  Due to long hold times on the telephone, sending your provider a message by [facility] may be a faster and more efficient way to get a response.  Please allow 48 business hours for a response.  Please remember that this is for non-urgent requests.  _______________________________________________________  [facility] is using a team-based approach to care.  Your team is made up of your doctor and two to three APPS. Our APPS (Nurse Practitioners and Physician Assistants) work with your physician to ensure care continuity for you. They are fully qualified to address your health concerns and develop a treatment plan. They communicate directly with your gastroenterologist to care for you. Seeing the Advanced Practice Practitioners on your physician's team can help you by facilitating care more  promptly, often allowing for earlier appointments, access to diagnostic testing, procedures, and other specialty referrals.

## 2023-10-20 NOTE — Assessment & Plan Note (Signed)
 Patient continues to struggle with bloating and Nausea especially with bowel movements. She notes excessive gas also which has historically been well treated with Gas X but it is not working as much.  4. 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 Consider further evaluation with nonemergent pancreatic protocol MRI with and without contrast.

## 2023-10-20 NOTE — Progress Notes (Signed)
 Subjective:    Patient ID: Lauren Hall, female    DOB: 09-28-1970, 53 y.o.   MRN: [id number]  Chief Complaint  Patient presents with   Medical Management of Chronic Issues    Patient presents today for a 3 month follow-up.   Quality Metric Gaps    HIV & Hep C screening, foot & eye exam, Hep B & zoster vaccines, UDS    HPI Discussed the use of AI scribe software for clinical note transcription with the patient, who gave verbal consent to proceed.  History of Present Illness Lauren Hall is a 53 year old female who presents with gastrointestinal symptoms and concerns about a pancreatic lesion.  She has been experiencing bloating and gas since late July, which began after consuming a salad with raw broccoli. Despite dietary changes and the use of Gas-X, the symptoms persist. The gas feels trapped, causing significant discomfort and sometimes radiates to her back. She is currently on a FODMAP diet, which she finds challenging, and has been prescribed pantoprazole  for acid reflux, providing some relief but not complete resolution of symptoms.  A CT scan revealed a cystic lesion on her pancreas. An MRI is scheduled for further evaluation. She is concerned about the possibility of pancreatic cancer.  She experiences gagging during bowel movements, which she has reported to her healthcare provider, although it was not documented in her medical notes.  She has a history of thyroid  concerns, with a mildly abnormal TSH but normal T4 levels. She reports a sensation of something being present in her throat, particularly when swallowing pills, which sometimes feel stuck.  She has been using Trulicity  for diabetes management but has stopped it recently due to concerns it may be contributing to her gastrointestinal symptoms. She reports some improvement in symptoms since discontinuing it, although she misses the appetite suppression and weight management benefits it provided.    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   Complication of anesthesia    patient woke up while they were sewing up her incision with breast lumpectomy   Depression    Diabetes (HCC)    type 2   Dysrhythmia    Tachycardia   Enlarged thyroid     Family history of adverse reaction to anesthesia    mom , It would trigger her asthma   Fatigue 10/08/2010   Fibromyalgia 2009   GERD (gastroesophageal reflux disease) 09/26/2013   Headache(784.0) 10/08/2010   Migraines   Hip pain, bilateral 11/06/2010   Hyperglycemia 08/11/2015   normal Hgb A 1C per patient   Hypertension    Hypothyroidism 10/08/2010   Low back pain radiating to right leg 03/11/2015   Other and unspecified hyperlipidemia 07/29/2012   Peripheral neuropathy 11/11/2012   burning sensation mainly right leg   .. pt. denies neuropathy   Poor concentration 01/11/2011   Preventative health care 07/29/2012   S/P laparoscopic hysterectomy 10/07/2015   Sinusitis, acute 07/27/2011   Tachycardia    Thyroid  disease 10/08/2010   Tick bite of flank 11/09/2011   Tinea corporis 11/11/2012   Uterine leiomyoma    Walking pneumonia     Past Surgical History:  Procedure Laterality Date   ABDOMINAL HYSTERECTOMY     BREAST LUMPECTOMY Right 1999   benign    COLONOSCOPY WITH PROPOFOL  N/A 05/24/2023   Procedure: COLONOSCOPY WITH  PROPOFOL ;  Surgeon: Legrand Victory LITTIE DOUGLAS, MD;  Location: [facility];  Service: Gastroenterology;  Laterality: N/A;   CYSTOSCOPY N/A 10/07/2015   Procedure: CYSTOSCOPY;  Surgeon: Gigi Botts, MD;  Location: [facility];  Service: Gynecology;  Laterality: N/A;   FOOT SURGERY Bilateral    Shaved off piece of bone bil baby toes   POLYPECTOMY  05/24/2023   Procedure: POLYPECTOMY, INTESTINE;  Surgeon: Legrand Victory LITTIE DOUGLAS, MD;  Location: [facility];  Service: Gastroenterology;;    Family History   Problem Relation Age of Onset   Colon polyps Mother    Fibromyalgia Mother    Diabetes Mother        type 2   Hypertension Mother    Hyperlipidemia Mother    Arthritis Mother    Alcohol abuse Father    Colon cancer Father    Hypertension Brother    Alcohol abuse Brother    Heart disease Maternal Grandmother    Hyperlipidemia Maternal Grandmother    Diabetes Maternal Grandmother        type 2   Colon cancer Maternal Grandmother    Stroke Maternal Grandfather    Diabetes Maternal Grandfather        type 2   Ovarian cancer Paternal Grandmother    Esophageal cancer Paternal Grandfather    Alzheimer's disease Paternal Grandfather    Rectal cancer Neg Hx    Stomach cancer Neg Hx     Social History   Socioeconomic History   Marital status: Married    Spouse name: Derrick   Number of children: Not on file   Years of education: Not on file   Highest education level: Not on file  Occupational History   Not on file  Tobacco Use   Smoking status: Never   Smokeless tobacco: Never  Vaping Use   Vaping status: Never Used  Substance and Sexual Activity   Alcohol use: Not Currently    Comment: special occassion- very rarely   Drug use: No   Sexual activity: Yes    Partners: Male    Birth control/protection: Surgical  Other Topics Concern   Not on file  Social History Narrative   Not on file   Social Drivers of Health   Financial Resource Strain: Not on file  Food Insecurity: Not on file  Transportation Needs: Not on file  Physical Activity: Not on file  Stress: Not on file  Social Connections: Not on file  Intimate Partner Violence: Not on file    Outpatient Medications Prior to Visit  Medication Sig Dispense Refill   ALPRAZolam  (XANAX ) 1 MG tablet Take 1 tablet (1 mg total) by mouth 2 (two) times daily. 60 tablet 2   amLODipine  (NORVASC ) 5 MG tablet TAKE 1 TABLET (5 MG TOTAL) BY MOUTH DAILY. 30 tablet 11   atorvastatin  (LIPITOR) 10 MG tablet Take 0.5 tablets (5  mg total) by mouth daily. 15 tablet 5   Blood Glucose Monitoring Suppl DEVI 1 each by Does not apply route in the morning, at noon, and at bedtime. May substitute to any manufacturer covered by patient's insurance. 1 each 0   busPIRone  (BUSPAR ) 10 MG tablet Take 1 tablet (10 mg total) by mouth 3 (three) times daily. 270 tablet 1   gabapentin  (NEURONTIN ) 300 MG capsule Take 1 capsule (300 mg total) by mouth 2 (two) times daily AND 2 capsules (600 mg total) at bedtime. 120 capsule 1   Glucose Blood (BLOOD GLUCOSE TEST STRIPS) STRP Check blood sugars 3  times daily 300 each 12   HYDROcodone -acetaminophen  (NORCO/VICODIN) 5-325 MG tablet Take 1 tablet by mouth every 6 (six) hours as needed for moderate pain (pain score 4-6). 60 tablet 0   Lancets (ONETOUCH DELICA PLUS LANCET30G) MISC Check blood sugars 3 times daily 300 each 12   levothyroxine  (SYNTHROID ) 25 MCG tablet 6 days a week skip sundays 90 tablet 1   lisinopril  (ZESTRIL ) 10 MG tablet Take 1 tablet (10 mg total) by mouth in the morning and at bedtime. 180 tablet 0   loratadine (CLARITIN) 10 MG tablet Take 10 mg by mouth daily.     LOW-DOSE ASPIRIN PO      meloxicam  (MOBIC ) 15 MG tablet TAKE 1 TABLET BY MOUTH EVERY DAY AS NEEDED FOR PAIN 30 tablet 3   ondansetron  (ZOFRAN ) 4 MG tablet TAKE 1 TABLET BY MOUTH EVERY 8 HOURS AS NEEDED FOR NAUSEA AND VOMITING 18 tablet 3   pantoprazole  (PROTONIX ) 40 MG tablet Take 1 tablet (40 mg total) by mouth daily. 30 tablet 3   tiZANidine  (ZANAFLEX ) 2 MG tablet Take 0.5-2 tablets (1-4 mg total) by mouth every 8 (eight) hours as needed for muscle spasms. 60 tablet 3   venlafaxine  XR (EFFEXOR -XR) 150 MG 24 hr capsule Take 1 capsule (150 mg total) by mouth every morning. 90 capsule 3   Dulaglutide  (TRULICITY ) 0.75 MG/0.5ML SOAJ Inject 0.75 mg into the skin once a week. 2 mL 1   No facility-administered medications prior to visit.    Allergies  Allergen Reactions   Promethazine Anaphylaxis   Promethazine Hcl  Anaphylaxis    Review of Systems  Constitutional:  Positive for malaise/fatigue. Negative for fever.  HENT:  Negative for congestion.   Eyes:  Negative for blurred vision.  Respiratory:  Negative for shortness of breath.   Cardiovascular:  Negative for chest pain, palpitations and leg swelling.  Gastrointestinal:  Positive for abdominal pain and nausea. Negative for blood in stool, melena and vomiting.  Genitourinary:  Negative for dysuria and frequency.  Musculoskeletal:  Negative for falls.  Skin:  Negative for rash.  Neurological:  Negative for dizziness, loss of consciousness and headaches.  Endo/Heme/Allergies:  Negative for environmental allergies.  Psychiatric/Behavioral:  Positive for depression. Negative for suicidal ideas. The patient is nervous/anxious.        Objective:    Physical Exam Constitutional:      General: She is not in acute distress.    Appearance: Normal appearance. She is not diaphoretic.  HENT:     Head: Normocephalic and atraumatic.     Right Ear: Tympanic membrane, ear canal and external ear normal.     Left Ear: Tympanic membrane, ear canal and external ear normal.     Nose: Nose normal.     Mouth/Throat:     Mouth: Mucous membranes are moist.     Pharynx: Oropharynx is clear. No oropharyngeal exudate.  Eyes:     General: No scleral icterus.       Right eye: No discharge.        Left eye: No discharge.     Conjunctiva/sclera: Conjunctivae normal.     Pupils: Pupils are equal, round, and reactive to light.  Neck:     Thyroid : No thyromegaly.  Cardiovascular:     Rate and Rhythm: Normal rate and regular rhythm.     Heart sounds: Normal heart sounds. No murmur heard. Pulmonary:     Effort: Pulmonary effort is normal. No respiratory distress.     Breath sounds: Normal  breath sounds. No wheezing or rales.  Abdominal:     General: Bowel sounds are normal. There is no distension.     Palpations: Abdomen is soft. There is no mass.      Tenderness: There is no abdominal tenderness.  Musculoskeletal:        General: No tenderness. Normal range of motion.     Cervical back: Normal range of motion and neck supple.  Lymphadenopathy:     Cervical: No cervical adenopathy.  Skin:    General: Skin is warm and dry.     Findings: No rash.  Neurological:     General: No focal deficit present.     Mental Status: She is alert and oriented to person, place, and time.     Cranial Nerves: No cranial nerve deficit.     Coordination: Coordination normal.     Deep Tendon Reflexes: Reflexes are normal and symmetric. Reflexes normal.  Psychiatric:        Mood and Affect: Mood normal.        Behavior: Behavior normal.        Thought Content: Thought content normal.        Judgment: Judgment normal.     BP 130/80   Pulse 84   Resp 16   Ht 4' 11 (1.499 m)   Wt 153 lb 3.2 oz (69.5 kg)   LMP 09/02/2015 (Approximate) Comment: continous bleeding  SpO2 97%   BMI 30.94 kg/m  Wt Readings from Last 3 Encounters:  10/20/23 153 lb 3.2 oz (69.5 kg)  09/28/23 155 lb (70.3 kg)  09/14/23 150 lb 6 oz (68.2 kg)    Diabetic Foot Exam - Simple   No data filed    Lab Results  Component Value Date   WBC 8.0 09/14/2023   HGB 13.6 09/14/2023   HCT 41.1 09/14/2023   PLT 355.0 09/14/2023   GLUCOSE 94 09/14/2023   CHOL 168 07/27/2023   TRIG 80.0 07/27/2023   HDL 65.10 07/27/2023   LDLDIRECT 146.0 09/19/2020   LDLCALC 87 07/27/2023   ALT 12 09/14/2023   AST 12 09/14/2023   NA 139 09/14/2023   K 3.8 09/14/2023   CL 100 09/14/2023   CREATININE 0.84 09/14/2023   BUN 7 09/14/2023   CO2 33 (H) 09/14/2023   TSH 0.28 (L) 09/14/2023   HGBA1C 5.9 07/27/2023   MICROALBUR <0.7 07/28/2023    Lab Results  Component Value Date   TSH 0.28 (L) 09/14/2023   Lab Results  Component Value Date   WBC 8.0 09/14/2023   HGB 13.6 09/14/2023   HCT 41.1 09/14/2023   MCV 90.5 09/14/2023   PLT 355.0 09/14/2023   Lab Results  Component Value Date    NA 139 09/14/2023   K 3.8 09/14/2023   CO2 33 (H) 09/14/2023   GLUCOSE 94 09/14/2023   BUN 7 09/14/2023   CREATININE 0.84 09/14/2023   BILITOT 0.3 09/14/2023   ALKPHOS 97 09/14/2023   AST 12 09/14/2023   ALT 12 09/14/2023   PROT 7.4 09/14/2023   ALBUMIN 4.4 09/14/2023   CALCIUM  9.0 09/14/2023   ANIONGAP 12 01/17/2018   EGFR 89 10/18/2022   GFR 78.57 09/14/2023   Lab Results  Component Value Date   CHOL 168 07/27/2023   Lab Results  Component Value Date   HDL 65.10 07/27/2023   Lab Results  Component Value Date   LDLCALC 87 07/27/2023   Lab Results  Component Value Date   TRIG 80.0 07/27/2023  Lab Results  Component Value Date   CHOLHDL 3 07/27/2023   Lab Results  Component Value Date   HGBA1C 5.9 07/27/2023       Assessment & Plan:  Depression with anxiety Assessment & Plan: On Venlafaxine    Primary hypertension Assessment & Plan: Well controlled, no changes to meds. Encouraged heart healthy diet such as the DASH diet and exercise as tolerated.     Mixed hyperlipidemia Assessment & Plan: Well controlled, no changes to meds. Encouraged heart healthy diet such as the DASH diet and exercise as tolerated. Tolerating Atorvastatin    Type 2 diabetes mellitus with hyperlipidemia (HCC) Assessment & Plan: hgba1c acceptable, minimize simple carbs. Increase exercise as tolerated. Continue current meds    Hypothyroidism, unspecified type Assessment & Plan: On Levothyroxine , continue to monitor  Orders: -     US  THYROID ; Future  Pancreatic lesion Assessment & Plan: Patient continues to struggle with bloating and Nausea especially with bowel movements. She notes excessive gas also which has historically been well treated with Gas X but it is not working as much.  4. 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 Consider further evaluation with nonemergent pancreatic protocol MRI with and without  contrast.   Dysphagia, unspecified type -     US  THYROID ; Future  Other orders -     Tirzepatide ; Inject 2.5 mg into the skin once a week.  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 It is likely benign. She experiences significant bloating and gas, potentially related to the lesion. Pancreatic cancer is a concern, but pancreatitis has been ruled out. The lesion's impact on pancreatic function and gastrointestinal symptoms is under evaluation. Imaging is necessary to monitor the lesion, with consideration of lifetime radiation exposure risk. - Perform MRI on September 25 to further evaluate the pancreatic lesion. - Continue dietary modifications, avoiding raw cruciferous vegetables. - Consider probiotics to improve gut flora and digestion.  Type 2 diabetes mellitus management and GLP-1 agonist intolerance Gastrointestinal issues potentially related to Trulicity  (dulaglutide ), a GLP-1 agonist, have been noted. She has been off Trulicity  for two weeks with some symptom improvement. Concerns about A1c and diabetes management impact were discussed. Alternative medications like Onglyza or Jardiance were considered, but she prefers GLP-1 agonists for appetite suppression. A trial off Trulicity  is recommended to determine symptom causation. If symptoms improve, Mounjaro  (tirzepatide ) may be considered as an alternative GLP-1 agonist. - Continue to hold Trulicity  for two more weeks to assess symptom improvement. - Consider starting Mounjaro  (tirzepatide ) after a total of one month off Trulicity , starting at the lowest dose and titrating up. - Monitor blood glucose levels and adjust diabetes management as needed.  Hypothyroidism with goiter She reports a sensation of something in her  throat when swallowing pills, possibly related to an enlarged thyroid . Previous labs showed slightly abnormal TSH but normal T4 levels. An ultrasound is warranted to evaluate the thyroid 's size and structure. - Order thyroid  ultrasound to evaluate the size and structure of the thyroid  gland. - Reassess thyroid  function tests in two months.  Chronic pain due to fibromyalgia and arthritis Chronic pain management continues with the use of pain medications as needed. A pain management agreement is in place to ensure compliance with controlled substance regulations. -  Continue current pain management regimen as needed. - Ensure compliance with pain management agreement.  General Health Maintenance She has not received a flu shot this season. - Administer flu shot.  Recording duration: 28 minutes     Harlene Horton, MD

## 2023-10-21 NOTE — Telephone Encounter (Signed)
 Pharmacy Patient Advocate Encounter  Received notification from [facility] that Prior Authorization for Mounjaro  2.5mg /0.62ml has been DENIED.  See denial reason below. No denial letter attached in CMM. Will attach denial letter to Media tab once received.   PA #/Case ID/Reference #: [id number]

## 2023-10-22 ENCOUNTER — Encounter: Payer: Self-pay | Admitting: Gastroenterology

## 2023-10-24 NOTE — Progress Notes (Signed)
 ____________________________________________________________  Attending physician addendum:  Thank you for sending this case to me. I have reviewed the entire note and agree with the plan.   Victory Brand, MD  ____________________________________________________________

## 2023-10-25 ENCOUNTER — Other Ambulatory Visit (HOSPITAL_COMMUNITY): Payer: Self-pay

## 2023-10-25 NOTE — Telephone Encounter (Signed)
 Per the denial letter, the patient has to have tried other products her plan covers and they did not work or the doctor gives a medical reason you cannot take those other products.   The preferred products are: Ryblesus, Liraglutide and Trulicity .   Please advise

## 2023-10-27 ENCOUNTER — Ambulatory Visit: Payer: Self-pay | Admitting: Gastroenterology

## 2023-10-27 ENCOUNTER — Encounter: Payer: Self-pay | Admitting: Family Medicine

## 2023-10-27 ENCOUNTER — Ambulatory Visit (HOSPITAL_COMMUNITY): Admission: RE | Admit: 2023-10-27 | Source: Ambulatory Visit

## 2023-10-27 ENCOUNTER — Other Ambulatory Visit: Payer: Self-pay | Admitting: Gastroenterology

## 2023-10-27 ENCOUNTER — Ambulatory Visit (HOSPITAL_COMMUNITY)
Admission: RE | Admit: 2023-10-27 | Discharge: 2023-10-27 | Disposition: A | Discharge status: Home / Self Care | Source: Ambulatory Visit | Attending: Gastroenterology | Admitting: Gastroenterology

## 2023-10-27 DIAGNOSIS — R9389 Abnormal findings on diagnostic imaging of other specified body structures: Secondary | ICD-10-CM | POA: Insufficient documentation

## 2023-10-27 DIAGNOSIS — Z0389 Encounter for observation for other suspected diseases and conditions ruled out: Secondary | ICD-10-CM | POA: Diagnosis not present

## 2023-10-27 DIAGNOSIS — R1011 Right upper quadrant pain: Secondary | ICD-10-CM | POA: Diagnosis present

## 2023-10-27 MED ORDER — GADOBUTROL 1 MMOL/ML IV SOLN
7.0000 mL | Freq: Once | INTRAVENOUS | Status: AC | PRN
  Administered 2023-10-27: 7 mL via INTRAVENOUS

## 2023-10-30 ENCOUNTER — Other Ambulatory Visit: Payer: Self-pay | Admitting: Family Medicine

## 2023-10-30 MED ORDER — OZEMPIC (0.25 OR 0.5 MG/DOSE) 2 MG/3ML ~~LOC~~ SOPN: 0.2500 mg | PEN_INJECTOR | SUBCUTANEOUS | 1 refills | Status: DC

## 2023-10-31 ENCOUNTER — Telehealth: Payer: Self-pay

## 2023-10-31 ENCOUNTER — Other Ambulatory Visit (HOSPITAL_COMMUNITY): Payer: Self-pay

## 2023-10-31 NOTE — Telephone Encounter (Signed)
 Pharmacy Patient Advocate Encounter   Received notification from RX Request Messages that prior authorization for Ozempic 2mg /38ml is required/requested.   Insurance verification completed.   The patient is insured through [facility] .   Per test claim: PA required; PA submitted to above mentioned insurance via Latent Key/confirmation #/EOC [id number] Status is pending

## 2023-11-01 ENCOUNTER — Ambulatory Visit (HOSPITAL_BASED_OUTPATIENT_CLINIC_OR_DEPARTMENT_OTHER)
Admission: RE | Admit: 2023-11-01 | Discharge: 2023-11-01 | Disposition: A | Discharge status: Home / Self Care | Source: Ambulatory Visit | Attending: Family Medicine | Admitting: Family Medicine

## 2023-11-01 DIAGNOSIS — R1310 Dysphagia, unspecified: Secondary | ICD-10-CM | POA: Diagnosis not present

## 2023-11-01 DIAGNOSIS — E039 Hypothyroidism, unspecified: Secondary | ICD-10-CM | POA: Diagnosis not present

## 2023-11-01 DIAGNOSIS — E041 Nontoxic single thyroid nodule: Secondary | ICD-10-CM | POA: Diagnosis not present

## 2023-11-01 NOTE — Telephone Encounter (Signed)
 Pharmacy Patient Advocate Encounter  Received notification from [facility] that Prior Authorization for Ozempic 2mg /16ml has been DENIED.  See denial reason below. No denial letter attached in CMM. Will attach denial letter to Media tab once received.   PA #/Case ID/Reference #: [id number]    The preferred products for the plan are: Ryblesus, Trulicity , Liraglutide.

## 2023-11-02 ENCOUNTER — Ambulatory Visit: Payer: Self-pay | Admitting: Family Medicine

## 2023-11-02 NOTE — Telephone Encounter (Signed)
 Ok thank you

## 2023-11-03 NOTE — Progress Notes (Signed)
 Patient reviewed via MyChart.

## 2023-11-03 NOTE — Telephone Encounter (Signed)
 Patient was made aware

## 2023-11-04 ENCOUNTER — Other Ambulatory Visit: Payer: Self-pay | Admitting: Family Medicine

## 2023-11-04 ENCOUNTER — Encounter: Payer: Self-pay | Admitting: Family Medicine

## 2023-11-04 ENCOUNTER — Telehealth: Payer: Self-pay | Admitting: Pharmacist

## 2023-11-04 ENCOUNTER — Other Ambulatory Visit: Payer: Self-pay | Admitting: Family

## 2023-11-04 MED ORDER — HYDROCODONE-ACETAMINOPHEN 5-325 MG PO TABS: 1.0000 | ORAL_TABLET | Freq: Four times a day (QID) | ORAL | 0 refills | Status: DC | PRN

## 2023-11-04 NOTE — Telephone Encounter (Unsigned)
 Copied from CRM #[id number]. Topic: Clinical - Medication Refill >> Nov 04, 2023 10:11 AM Suzen RAMAN wrote: Medication: HYDROcodone -acetaminophen  (NORCO/VICODIN) 5-325 MG tablet   Has the patient contacted their pharmacy? Yes   This is the patient's preferred pharmacy:  [id number] [place],  - [address] AT [facility] [address] [place] [address] Phone: [phone] Fax: [phone]  Is this the correct pharmacy for this prescription? Yes If no, delete pharmacy and type the correct one.   Has the prescription been filled recently? Yes.SABRA 10/05/23  Is the patient out of the medication? Yes  Has the patient been seen for an appointment in the last year OR does the patient have an upcoming appointment? Yes  Can we respond through MyChart? Yes  Agent: Please be advised that Rx refills may take up to 3 business days. We ask that you follow-up with your pharmacy.

## 2023-11-04 NOTE — Telephone Encounter (Signed)
 Requesting: hydrocodone  5-325mg   Contract: 10/27/23 UDS: None Last Visit: 10/20/23 Next Visit: 02/20/24 Last Refill: 10/05/23 #60 and 0RF   Please Advise

## 2023-11-04 NOTE — Telephone Encounter (Signed)
 Gracy Devere SAILOR, [facility]    11/04/23  3:53 PM Note Per insurance requirements, the patient must try and fail the preferred products--Rybelsus, Trulicity , and liraglutide--or have documented medical reasons why these agents cannot be used. The record currently notes that she tried and failed Trulicity  due to GI intolerance. Please provide clinical rationale as to why she is unable to use Rybelsus and liraglutide, as patient preference alone will not typically be considered sufficient for approval. We would like to make this a strong appeal and need supporting documentation to strengthen the request.   Thank you, Devere Gracy, PharmD Clinical Pharmacist  [place]  Direct Dial: [phone]

## 2023-11-04 NOTE — Telephone Encounter (Signed)
 Per insurance requirements, the patient must try and fail the preferred products--Rybelsus, Trulicity , and liraglutide--or have documented medical reasons why these agents cannot be used. The record currently notes that she tried and failed Trulicity  due to GI intolerance. Please provide clinical rationale as to why she is unable to use Rybelsus and liraglutide, as patient preference alone will not typically be considered sufficient for approval. We would like to make this a strong appeal and need supporting documentation to strengthen the request.  Thank you, Devere Pandy, PharmD Clinical Pharmacist    Direct Dial: [phone]

## 2023-11-04 NOTE — Telephone Encounter (Signed)
 Added this to other message for Dr. Domenica to review.

## 2023-11-08 NOTE — Telephone Encounter (Signed)
 Are we using Saxenda for her diabetes?

## 2023-11-08 NOTE — Telephone Encounter (Signed)
 They do not make the Victoza so that leaves us  with the Rybelsus left to try.

## 2023-11-09 ENCOUNTER — Other Ambulatory Visit: Payer: Self-pay | Admitting: Family Medicine

## 2023-11-09 MED ORDER — RYBELSUS 3 MG PO TABS
3.0000 mg | ORAL_TABLET | Freq: Every day | ORAL | 1 refills | Status: DC
Start: 2023-11-09 — End: 2023-11-14

## 2023-11-09 NOTE — Telephone Encounter (Signed)
Pt will try the Rybelsus

## 2023-11-10 ENCOUNTER — Other Ambulatory Visit (HOSPITAL_COMMUNITY): Payer: Self-pay

## 2023-11-10 ENCOUNTER — Telehealth: Payer: Self-pay

## 2023-11-10 NOTE — Telephone Encounter (Signed)
 Left detailed message on machine that rx was sent in and to let us  know if she does not tolerate it.

## 2023-11-10 NOTE — Telephone Encounter (Signed)
 Pharmacy Patient Advocate Encounter   Received notification from RX Request Messages that prior authorization for Rybelsus 3mg  tabs is required/requested.   Insurance verification completed.   The patient is insured through [facility].   Per test claim: PA required; PA started via CoverMyMeds. KEY [id number] . Waiting for clinical questions to populate.

## 2023-11-10 NOTE — Telephone Encounter (Signed)
 Clinical questions answered and PA submitted.
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
## 2023-11-10 NOTE — Telephone Encounter (Signed)
 Pharmacy Patient Advocate Encounter  Received notification from [facility] that Prior Authorization for Rybelsus 3mg  tabs has been APPROVED from 11/10/23 to 11/09/24   PA #/Case ID/Reference #: [id number]

## 2023-11-12 ENCOUNTER — Other Ambulatory Visit: Payer: Self-pay | Admitting: Family Medicine

## 2023-11-15 ENCOUNTER — Ambulatory Visit (INDEPENDENT_AMBULATORY_CARE_PROVIDER_SITE_OTHER): Admitting: Family Medicine

## 2023-11-15 ENCOUNTER — Encounter: Payer: Self-pay | Admitting: Family Medicine

## 2023-11-15 VITALS — BP 140/70 | HR 99 | Ht 59.0 in | Wt 160.8 lb

## 2023-11-15 DIAGNOSIS — M9901 Segmental and somatic dysfunction of cervical region: Secondary | ICD-10-CM | POA: Diagnosis not present

## 2023-11-15 DIAGNOSIS — M9902 Segmental and somatic dysfunction of thoracic region: Secondary | ICD-10-CM

## 2023-11-15 DIAGNOSIS — M9908 Segmental and somatic dysfunction of rib cage: Secondary | ICD-10-CM | POA: Diagnosis not present

## 2023-11-15 DIAGNOSIS — S134XXA Sprain of ligaments of cervical spine, initial encounter: Secondary | ICD-10-CM | POA: Diagnosis not present

## 2023-11-15 DIAGNOSIS — M797 Fibromyalgia: Secondary | ICD-10-CM | POA: Diagnosis not present

## 2023-11-15 DIAGNOSIS — M9903 Segmental and somatic dysfunction of lumbar region: Secondary | ICD-10-CM

## 2023-11-15 DIAGNOSIS — M545 Low back pain, unspecified: Secondary | ICD-10-CM

## 2023-11-15 DIAGNOSIS — R1011 Right upper quadrant pain: Secondary | ICD-10-CM | POA: Diagnosis not present

## 2023-11-15 DIAGNOSIS — M9904 Segmental and somatic dysfunction of sacral region: Secondary | ICD-10-CM

## 2023-11-15 DIAGNOSIS — M79604 Pain in right leg: Secondary | ICD-10-CM | POA: Diagnosis not present

## 2023-11-15 LAB — COMPREHENSIVE METABOLIC PANEL WITH GFR
ALT: 26 U/L (ref 0–35)
AST: 17 U/L (ref 0–37)
Albumin: 4.4 g/dL (ref 3.5–5.2)
Alkaline Phosphatase: 135 U/L — ABNORMAL HIGH (ref 39–117)
BUN: 5 mg/dL — ABNORMAL LOW (ref 6–23)
CO2: 29 meq/L (ref 19–32)
Calcium: 9.1 mg/dL (ref 8.4–10.5)
Chloride: 102 meq/L (ref 96–112)
Creatinine, Ser: 0.72 mg/dL (ref 0.40–1.20)
GFR: 94.42 mL/min (ref >60.00)
Glucose, Bld: 124 mg/dL — ABNORMAL HIGH (ref 70–99)
Potassium: 3.7 meq/L (ref 3.5–5.1)
Sodium: 139 meq/L (ref 135–145)
Total Bilirubin: 0.3 mg/dL (ref 0.2–1.2)
Total Protein: 7.3 g/dL (ref 6.0–8.3)

## 2023-11-15 LAB — SEDIMENTATION RATE: Sed Rate: 21 mm/h (ref 0–30)

## 2023-11-15 NOTE — Patient Instructions (Addendum)
 Good to see you. Labs today  See me again in 2 months

## 2023-11-15 NOTE — Assessment & Plan Note (Signed)
 More stable at this time.

## 2023-11-15 NOTE — Assessment & Plan Note (Signed)
 Recently has send family history of pancreatic cancer.  Will get laboratory markers to rule this out for this individual.  Having intermittent abdominal pain

## 2023-11-15 NOTE — Assessment & Plan Note (Signed)
 Seems to be making significant improvement at this time.  Attempted osteopathic manipulation.  Not back to patient's baseline but has made improvement.  Continue to work on home exercises, take the medications including the muscle relaxer as needed, follow-up with me again 6 to 8 weeks otherwise.

## 2023-11-16 ENCOUNTER — Ambulatory Visit: Payer: Self-pay | Admitting: Family Medicine

## 2023-11-16 LAB — LACTATE DEHYDROGENASE: LDH: 202 U/L (ref 120–250)

## 2023-11-17 ENCOUNTER — Ambulatory Visit: Admitting: Family Medicine

## 2023-11-18 LAB — CANCER ANTIGEN 19-9: CA 19-9: 5 U/mL (ref <34)

## 2023-11-18 LAB — CEA: CEA: <2 ng/mL

## 2023-11-29 ENCOUNTER — Encounter: Payer: Self-pay | Admitting: Adult Health

## 2023-11-29 ENCOUNTER — Telehealth: Admitting: Adult Health

## 2023-11-29 DIAGNOSIS — F331 Major depressive disorder, recurrent, moderate: Secondary | ICD-10-CM | POA: Diagnosis not present

## 2023-11-29 DIAGNOSIS — F411 Generalized anxiety disorder: Secondary | ICD-10-CM

## 2023-11-29 DIAGNOSIS — G4700 Insomnia, unspecified: Secondary | ICD-10-CM | POA: Diagnosis not present

## 2023-11-29 DIAGNOSIS — F410 Panic disorder [episodic paroxysmal anxiety] without agoraphobia: Secondary | ICD-10-CM

## 2023-11-29 MED ORDER — ALPRAZOLAM 1 MG PO TABS: 1.0000 mg | ORAL_TABLET | Freq: Two times a day (BID) | ORAL | 2 refills | Status: AC
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
## 2023-12-05 ENCOUNTER — Other Ambulatory Visit: Payer: Self-pay | Admitting: Family Medicine

## 2023-12-05 MED ORDER — HYDROCODONE-ACETAMINOPHEN 5-325 MG PO TABS: 1.0000 | ORAL_TABLET | Freq: Four times a day (QID) | ORAL | 0 refills | Status: DC | PRN

## 2023-12-05 NOTE — Telephone Encounter (Signed)
 Copied from CRM [id number]. Topic: Clinical - Medication Refill >> Dec 05, 2023 10:05 AM Ashley R wrote: Medication: HYDROcodone -acetaminophen  (NORCO/VICODIN) 5-325 MG tablet  Has the patient contacted their pharmacy? Yes, no refills  This is the patient's preferred pharmacy:  [id number] [place] - [address] AT [facility] [address] [place] [address] Phone: [phone] Fax: [phone]  Is this the correct pharmacy for this prescription? Yes  Has the prescription been filled recently? Yes  Is the patient out of the medication? Yes  Has the patient been seen for an appointment in the last year OR does the patient have an upcoming appointment? Yes  Can we respond through MyChart? Yes  Agent: Please be advised that Rx refills may take up to 3 business days. We ask that you follow-up with your pharmacy.

## 2023-12-05 NOTE — Telephone Encounter (Signed)
 Requesting: hydrocodone  5-325mg  Contract: 10/27/23 LID:[id number] Last Visit: 10/20/23 Next Visit:02/20/24 Last Refill: 11/04/23 #60 and 0RF   Please Advise

## 2023-12-06 ENCOUNTER — Encounter: Payer: Self-pay | Admitting: Family Medicine

## 2023-12-08 ENCOUNTER — Other Ambulatory Visit: Payer: Self-pay | Admitting: Family Medicine

## 2023-12-08 DIAGNOSIS — I10 Essential (primary) hypertension: Secondary | ICD-10-CM

## 2023-12-08 DIAGNOSIS — E782 Mixed hyperlipidemia: Secondary | ICD-10-CM

## 2023-12-08 DIAGNOSIS — E1169 Type 2 diabetes mellitus with other specified complication: Secondary | ICD-10-CM

## 2023-12-08 DIAGNOSIS — M79604 Pain in right leg: Secondary | ICD-10-CM

## 2023-12-13 ENCOUNTER — Encounter: Admitting: Obstetrics and Gynecology

## 2023-12-13 ENCOUNTER — Telehealth: Payer: Self-pay

## 2023-12-13 NOTE — Progress Notes (Unsigned)
 Complex Care Management Note Care Guide Note  12/13/2023 Name: Lauren Hall MRN: [id number] DOB: [birth year]   Complex Care Management Outreach Attempts: An unsuccessful telephone outreach was attempted today to offer the patient information about available complex care management services.  Follow Up Plan:  Additional outreach attempts will be made to offer the patient complex care management information and services.   Encounter Outcome:  No Answer  Dreama [facility]  [facility], [facility] VBCI Assistant Direct Dial: [phone]  Fax: [phone]

## 2023-12-14 ENCOUNTER — Other Ambulatory Visit: Payer: Self-pay | Admitting: Gastroenterology

## 2023-12-14 NOTE — Progress Notes (Unsigned)
 Complex Care Management Note Care Guide Note  12/14/2023 Name: Lauren Hall MRN: [id number] DOB: 01-25-1971   Complex Care Management Outreach Attempts: A second unsuccessful outreach was attempted today to offer the patient with information about available complex care management services.  Follow Up Plan:  Additional outreach attempts will be made to offer the patient complex care management information and services.   Encounter Outcome:  No Answer  Dreama [facility]  [facility], [facility] VBCI Assistant Direct Dial: [phone]  Fax: [phone]

## 2023-12-16 NOTE — Progress Notes (Signed)
 Complex Care Management Note Care Guide Note  12/16/2023 Name: Lauren Hall MRN: [id number] DOB: September 11, 1970   Complex Care Management Outreach Attempts: A third unsuccessful outreach was attempted today to offer the patient with information about available complex care management services.  Follow Up Plan:  No further outreach attempts will be made at this time. We have been unable to contact the patient to offer or enroll patient in complex care management services.  Encounter Outcome:  No Answer  Dreama [facility]  [facility], [facility] VBCI Assistant Direct Dial: [phone]  Fax: [phone]

## 2023-12-21 ENCOUNTER — Other Ambulatory Visit: Payer: Self-pay | Admitting: Adult Health

## 2023-12-21 DIAGNOSIS — F411 Generalized anxiety disorder: Secondary | ICD-10-CM

## 2024-01-04 ENCOUNTER — Telehealth: Payer: Self-pay | Admitting: Family Medicine

## 2024-01-04 NOTE — Telephone Encounter (Unsigned)
 Copied from CRM [id number]. Topic: Clinical - Medication Refill >> Jan 04, 2024  9:30 AM Berneda FALCON wrote: Medication: HYDROcodone -acetaminophen  (NORCO/VICODIN) 5-325 MG tablet  Has the patient contacted their pharmacy? Yes (Agent: If no, request that the patient contact the pharmacy for the refill. If patient does not wish to contact the pharmacy document the reason why and proceed with request.) (Agent: If yes, when and what did the pharmacy advise?)  This is the patient's preferred pharmacy:  [id number] [place], [place] - [address] AT [facility] [address] [place] [address] Phone: [phone] Fax: [phone]  Is this the correct pharmacy for this prescription? Yes If no, delete pharmacy and type the correct one.   Has the prescription been filled recently? No  Is the patient out of the medication? Yes  Has the patient been seen for an appointment in the last year OR does the patient have an upcoming appointment? Yes  Can we respond through MyChart? Yes  Agent: Please be advised that Rx refills may take up to 3 business days. We ask that you follow-up with your pharmacy.

## 2024-01-05 MED ORDER — HYDROCODONE-ACETAMINOPHEN 5-325 MG PO TABS: 1.0000 | ORAL_TABLET | Freq: Four times a day (QID) | ORAL | 0 refills | Status: DC | PRN

## 2024-01-05 NOTE — Telephone Encounter (Signed)
 Requesting: hydrocodone  5-325mg  Contract: 10/27/23 UDS: None Last Visit: 10/20/23 Next Visit: 02/20/24 Last Refill: 12/05/23 #60 and 0RF   Please Advise

## 2024-01-10 NOTE — Progress Notes (Signed)
 Complex Care Management Note  Care Guide Note 01/10/2024 Name: [name] MRN: [id number] DOB: [birth year]  Lauren Hall Ousley is a 53 y.o. year old female who sees Domenica Harlene LABOR, MD for primary care. I reached out to Lauren Hall Pouch by phone today to offer complex care management services.  Ms. Rotz was given information about Complex Care Management services today including:   The Complex Care Management services include support from the care team which includes your Nurse Care Manager, Clinical Social Worker, or Pharmacist.  The Complex Care Management team is here to help remove barriers to the health concerns and goals most important to you. Complex Care Management services are voluntary, and the patient may decline or stop services at any time by request to their care team member.   Complex Care Management Consent Status: Patient agreed to services and verbal consent obtained.   Follow up plan:  Telephone appointment with complex care management team member scheduled for:  01/18/24 at 3:00 p.m.   Encounter Outcome:  Patient Scheduled  Dreama [facility]  [facility], [facility] VBCI Assistant Direct Dial: [phone]  Fax: [phone]

## 2024-01-16 NOTE — Progress Notes (Deleted)
 " [facility] [address] [place] [address] Phone: [phone] Subjective:    I'm seeing this patient by the request  of:  Domenica Harlene LABOR, MD  CC: Back and neck pain follow-up  YEP:Dlagzrupcz  Lauren Hall is a 53 y.o. female coming in with complaint of back and neck pain. OMT on 11/15/2023. Patient states   Medications patient has been prescribed:   Taking:      Recently had refill from primary care provider for the gabapentin .   Reviewed prior external information including notes and imaging from previsou exam, outside providers and external EMR if available.   As well as notes that were available from care everywhere and other healthcare systems.  Past medical history, social, surgical and family history all reviewed in electronic medical record.  No pertanent information unless stated regarding to the chief complaint.   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   Complication of anesthesia    patient woke up while they were sewing up her incision with breast lumpectomy   Depression    Diabetes (HCC)    type 2   Dysrhythmia    Tachycardia   Enlarged thyroid     Family history of adverse reaction to anesthesia    mom , It would trigger her asthma   Fatigue 10/08/2010   Fibromyalgia 2009   GERD (gastroesophageal reflux disease) 09/26/2013   Headache(784.0) 10/08/2010   Migraines   Hip pain, bilateral 11/06/2010   Hyperglycemia 08/11/2015   normal Hgb A 1C per patient   Hypertension    Hypothyroidism 10/08/2010   Low back pain radiating to right leg 03/11/2015   Other and unspecified hyperlipidemia 07/29/2012   Peripheral neuropathy 11/11/2012   burning sensation mainly right leg   .. pt. denies neuropathy   Poor concentration 01/11/2011   Preventative health care 07/29/2012   S/P  laparoscopic hysterectomy 10/07/2015   Sinusitis, acute 07/27/2011   Tachycardia    Thyroid  disease 10/08/2010   Tick bite of flank 11/09/2011   Tinea corporis 11/11/2012   Uterine leiomyoma    Walking pneumonia     Allergies[1]   Review of Systems:  No headache, visual changes, nausea, vomiting, diarrhea, constipation, dizziness, abdominal pain, skin rash, fevers, chills, night sweats, weight loss, swollen lymph nodes, body aches, joint swelling, chest pain, shortness of breath, mood changes. POSITIVE muscle aches  Objective  Last menstrual period 09/02/2015.   General: No apparent distress alert and oriented x3 mood and affect normal, dressed appropriately.  HEENT: Pupils equal, extraocular movements intact  Respiratory: Patient's speak in full sentences and does not appear short of breath  Cardiovascular: No lower extremity edema, non tender, no erythema  Gait MSK:  Back   Osteopathic findings  C2 flexed rotated and side bent right C6 flexed rotated and side bent left T3 extended rotated and side bent right inhaled rib T9 extended rotated and side bent left L2 flexed rotated and side bent right Sacrum right on right       Assessment and Plan:  No problem-specific Assessment & Plan notes found for this encounter.    Nonallopathic problems  Decision today to treat with OMT was based on Physical Exam  After verbal consent patient was treated with HVLA, ME, FPR techniques in cervical, rib, thoracic, lumbar, and sacral  areas  Patient tolerated the procedure well with improvement in symptoms  Patient given exercises, stretches and lifestyle modifications  See medications in patient instructions if given  Patient will follow up in 4-8 weeks     The above documentation has been reviewed and is accurate and complete Spiro Ausborn M Berel Najjar, DO         Note: This dictation was prepared with Dragon dictation along with smaller phrase technology. Any transcriptional  errors that result from this process are unintentional.            [1]  Allergies Allergen Reactions   Promethazine Anaphylaxis   Promethazine Hcl Anaphylaxis   "

## 2024-01-17 ENCOUNTER — Ambulatory Visit: Admitting: Family Medicine

## 2024-01-18 ENCOUNTER — Other Ambulatory Visit

## 2024-01-18 ENCOUNTER — Ambulatory Visit: Admitting: Gastroenterology

## 2024-01-23 ENCOUNTER — Telehealth: Admitting: Adult Health

## 2024-02-03 ENCOUNTER — Other Ambulatory Visit: Payer: Self-pay | Admitting: Family Medicine

## 2024-02-03 NOTE — Telephone Encounter (Signed)
 Copied from CRM [id number]. Topic: Clinical - Medication Refill >> Feb 03, 2024  5:01 PM Lauren C wrote: Medication: HYDROcodone -acetaminophen  (NORCO/VICODIN) 5-325 MG tablet  Has the patient contacted their pharmacy? Yes  This is the patient's preferred pharmacy:  [id number] - [place], [place] - [address] AT [facility] [address] [place] [address] Phone: [phone] Fax: [phone]  Is this the correct pharmacy for this prescription? Yes If no, delete pharmacy and type the correct one.   Has the prescription been filled recently? Yes  Is the patient out of the medication? No  Has the patient been seen for an appointment in the last year OR does the patient have an upcoming appointment? Yes  Can we respond through MyChart? Yes  Agent: Please be advised that Rx refills may take up to 3 business days. We ask that you follow-up with your pharmacy.

## 2024-02-06 MED ORDER — HYDROCODONE-ACETAMINOPHEN 5-325 MG PO TABS: 1.0000 | ORAL_TABLET | Freq: Four times a day (QID) | ORAL | 0 refills | Status: AC | PRN

## 2024-02-06 NOTE — Telephone Encounter (Signed)
 Requesting: hydrocodone   Contract: 10/27/23 UDS: None  Last Visit: 10/20/23 Next Visit:02/20/24 Last Refill: 01/05/24 #60 and 0RF   Please Advise

## 2024-02-14 ENCOUNTER — Other Ambulatory Visit: Payer: Self-pay | Admitting: Family Medicine

## 2024-02-15 ENCOUNTER — Telehealth: Payer: Self-pay

## 2024-02-15 ENCOUNTER — Ambulatory Visit: Payer: Self-pay

## 2024-02-15 NOTE — Telephone Encounter (Signed)
 FYI Only or Action Required?: Action required by provider: request for appointment, clinical question for provider, and update on patient condition.  Patient was last seen in primary care on 10/20/2023 by Domenica Harlene LABOR, MD.  Called Nurse Triage reporting Vomiting and Weakness.  Symptoms began several weeks ago.  Interventions attempted: OTC medications: alka seltzer, mucinex , Prescription medications: zofran , Rest, hydration, or home remedies, and Dietary changes.  Symptoms are: gradually worsening.  Triage Disposition: Go to ED Now (or PCP Triage)  Patient/caregiver understands and will follow disposition?: No, refuses disposition      Copied from CRM [id number]. Topic: Clinical - Red Word Triage >> Feb 15, 2024  7:38 AM Eva FALCON wrote: Red Word that prompted transfer to Nurse Triage: post nasal drip that is going down to her stomach and making her nauseous and then she vomits. has been dealing with this since new years but it has been getting worst. Feels extremely weak since she is not able to eat. Pressure in her head, thick mucus. Reason for Disposition  Patient sounds very sick or weak to the triager  Answer Assessment - Initial Assessment Questions This RN recommended pt be examined in hospital, pt refusing. Advised pt call 911 or get to hospital asap if any new or worsening symptoms. Sending message to PCP office for call back to pt with further recommendations. Alerted CAL to ED refusal.       1. DESCRIPTION: Describe how you are feeling.     A little weak Post-nasal drip clear thick mucus making her nauseous to point of vomiting, appetite very minimal, feels sick even with description of food Eating ginger ale and crackers for while now  2. SEVERITY: How bad is it?  Can you stand and walk?     141 lbs now, was 160 lbs in October, been experiencing for few weeks Dr. Domenica had prescribed zofran  for it, usually works, throwing up the zofran  too Alka seltzer and  mucinex  usually work not this time  3. ONSET: When did these symptoms begin? (e.g., hours, days, weeks, months)     Been going on since I was young Since before new years, got better for few days then came right back, keeps doing this      6. OTHER SYMPTOMS: Do you have any other symptoms? (e.g., chest pain, fever, cough, SOB, vomiting, diarrhea, bleeding, other areas of pain)     Low appetite Sinus pressure headache 2/10 right now above eyes Sore stomach muscles from vomiting Prior diarrhea   Denies: Fever Cough SOB Chest pain Irregular heart beat Vomiting up all water Current diarrhea Too weak to stand Current vomiting Dry mouth lightheadedness  Protocols used: Weakness (Generalized) and Fatigue-A-AH

## 2024-02-16 ENCOUNTER — Other Ambulatory Visit: Payer: Self-pay | Admitting: Family Medicine

## 2024-02-16 MED ORDER — AZITHROMYCIN 250 MG PO TABS: ORAL_TABLET | ORAL | 0 refills | Status: AC

## 2024-02-16 NOTE — Telephone Encounter (Signed)
 Patient was advised and verbalized understanding.

## 2024-02-16 NOTE — Telephone Encounter (Signed)
 Patient states she reviewed  a note in her mychart this morning that told her to go to ED and she states it didn't mention anything about her sinuses so she thinks the RN did not properly report her symptoms.  Her sinuses cause post nasal drip,  becomes nauseated from that. Treats with OTC medications (whole box of Alka Seltzer, Mucinex ). Sinus pain above eyes with clear, thick mucous post nasal drip. Confirmed RN included all of those symptoms in the note that was sent to her PCP. She did not got to ED and would like to be seen by PCP instead. She states sinus infections don't require ED treatment. Confirmed with patient the new symptom since beginning of the month: cycles of nonstop vomiting/not able  keep water, ginger ale, Zofran  down. Last episode of vomiting was this past weekend. Not nauseated  today, it comes in waves then it will hit me again where her mucous and sinus pressure builds up and then causes the nonstop vomiting. In past 24 hours she has kept down: 36+ oz water, ginger ale, potato soup. Last Zofran  dose was yesterday AM. Weakness if mild to moderate fatigue.        CRM # [id number]    Created on: 02/16/2024 08:12 AM By: Margrette Pamala Hall    Source  Lauren Hall (Patient)   Subject  Lauren Hall (Patient)   Topic  Clinical - Red Word Triage    Communication  Red Word that prompted transfer to Nurse Triage: Patient called in stating she was supposed to receive a call back but didn't. Main issue sinus drainage/postnasal which makes her nauseous/vomit and weak to where she can't do her normal routine.

## 2024-02-20 ENCOUNTER — Ambulatory Visit: Admitting: Family Medicine

## 2024-02-29 ENCOUNTER — Telehealth: Admitting: Adult Health

## 2024-02-29 NOTE — Assessment & Plan Note (Signed)
 Well controlled, no changes to meds. Encouraged heart healthy diet such as the DASH diet and exercise as tolerated. Tolerating Atorvastatin

## 2024-02-29 NOTE — Progress Notes (Signed)
 "  Subjective:    Patient ID: Lauren Hall, female    DOB: 12-07-1970, 54 y.o.   MRN: [id number]  No chief complaint on file.   HPI Discussed the use of AI scribe software for clinical note transcription with the patient, who gave verbal consent to proceed.  History of Present Illness Lauren Hall is a 54 year old female with sinus issues and allergies who presents with persistent vomiting and postnasal drip.  She has been experiencing persistent vomiting and postnasal drip since around [place]. There is a sensation of drainage from her head, with pressure across her face and drainage down her throat, leading to nausea and vomiting. She has been unable to keep down water, Ginger Ale, or Zofran , resulting in weakness and an inability to eat normally, subsisting mainly on crackers and light foods.  Her symptoms have persisted for about a month, with periods of slight improvement followed by recurrence. She has been spitting up clear mucus and reports that an antibiotic did not alleviate her symptoms. She suspects her symptoms may be related to allergies, particularly to her child's kittens, as she is allergic to cats.  She has been taking Claritin, which has provided some relief, reducing her vomiting episodes. She has not been able to take Flonase due to side effects but has found some relief with Claritin. She has been unable to drink her usual coffee and has been experiencing cold sensations, particularly in her legs.  She has a history of diabetes and is currently taking atorvastatin , which she cuts in half to extend her supply, and has been on Rybelsus  for diabetes management. She expresses a preference to return to Trulicity , which her new insurance covers, but she is currently managing with Rybelsus .  She has been experiencing financial difficulties and has not been able to work since before Christmas. No fever or chills, but she reports cold sensations, particularly in her legs,  and has been experiencing weakness and nausea.    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   Complication of anesthesia    patient woke up while they were sewing up her incision with breast lumpectomy   Depression    Diabetes (HCC)    type 2   Dysrhythmia    Tachycardia   Enlarged thyroid     Family history of adverse reaction to anesthesia    mom , It would trigger her asthma   Fatigue 10/08/2010   Fibromyalgia 2009   GERD (gastroesophageal reflux disease) 09/26/2013   Headache(784.0) 10/08/2010   Migraines   Hip pain, bilateral 11/06/2010   Hyperglycemia 08/11/2015   normal Hgb A 1C per patient   Hypertension    Hypothyroidism 10/08/2010   Low back pain radiating to right leg 03/11/2015   Other and unspecified hyperlipidemia 07/29/2012   Peripheral neuropathy 11/11/2012   burning sensation mainly right leg   .. pt. denies neuropathy   Poor concentration 01/11/2011   Preventative health care 07/29/2012   S/P laparoscopic hysterectomy 10/07/2015   Sinusitis, acute 07/27/2011   Tachycardia    Thyroid  disease 10/08/2010   Tick bite of flank 11/09/2011   Tinea corporis 11/11/2012   Uterine leiomyoma    Walking pneumonia     Past Surgical History:  Procedure Laterality Date   ABDOMINAL HYSTERECTOMY     BREAST LUMPECTOMY Right 1999  benign    COLONOSCOPY WITH PROPOFOL  N/A 05/24/2023   Procedure: COLONOSCOPY WITH PROPOFOL ;  Surgeon: Legrand Victory LITTIE DOUGLAS, MD;  Location: [facility];  Service: Gastroenterology;  Laterality: N/A;   CYSTOSCOPY N/A 10/07/2015   Procedure: CYSTOSCOPY;  Surgeon: Gigi Botts, MD;  Location: [facility];  Service: Gynecology;  Laterality: N/A;   FOOT SURGERY Bilateral    Shaved off piece of bone bil baby toes   POLYPECTOMY  05/24/2023   Procedure: POLYPECTOMY, INTESTINE;  Surgeon: Legrand Victory LITTIE DOUGLAS, MD;  Location: [facility];  Service: Gastroenterology;;    Family History  Problem Relation Age of Onset   Colon polyps Mother    Fibromyalgia Mother    Diabetes Mother        type 2   Hypertension Mother    Hyperlipidemia Mother    Arthritis Mother    Alcohol abuse Father    Colon cancer Father    Hypertension Brother    Alcohol abuse Brother    Heart disease Maternal Grandmother    Hyperlipidemia Maternal Grandmother    Diabetes Maternal Grandmother        type 2   Colon cancer Maternal Grandmother    Stroke Maternal Grandfather    Diabetes Maternal Grandfather        type 2   Ovarian cancer Paternal Grandmother    Esophageal cancer Paternal Grandfather    Alzheimer's disease Paternal Grandfather    Rectal cancer Neg Hx    Stomach cancer Neg Hx     Social History   Socioeconomic History   Marital status: Married    Spouse name: Derrick   Number of children: Not on file   Years of education: Not on file   Highest education level: Not on file  Occupational History   Not on file  Tobacco Use   Smoking status: Never   Smokeless tobacco: Never  Vaping Use   Vaping status: Never Used  Substance and Sexual Activity   Alcohol use: Not Currently    Comment: special occassion- very rarely   Drug use: No   Sexual activity: Yes    Partners: Male    Birth control/protection: Surgical  Other Topics Concern   Not on file  Social History Narrative   Not on file   Social Drivers of Health   Tobacco Use: Low Risk (11/29/2023)   Patient History    Smoking Tobacco Use: Never    Smokeless Tobacco Use: Never    Passive Exposure: Not on file  Financial Resource Strain: Not on file  Food Insecurity: Not on file  Transportation Needs: Not on file  Physical Activity: Not on file  Stress: Not on file  Social Connections: Not on file  Intimate Partner Violence: Not on file  Depression (PHQ2-9): High Risk (10/20/2023)   Depression (PHQ2-9)    PHQ-2 Score: 14  Alcohol Screen: Not on  file  Housing: Not on file  Utilities: Not on file  Health Literacy: Not on file    Outpatient Medications Prior to Visit  Medication Sig Dispense Refill   ALPRAZolam  (XANAX ) 1 MG tablet Take 1 tablet (1 mg total) by mouth 2 (two) times daily. 60 tablet 2   amLODipine  (NORVASC ) 5 MG tablet TAKE 1 TABLET (5 MG TOTAL) BY MOUTH DAILY. 30 tablet 11   atorvastatin  (LIPITOR) 10 MG tablet Take 0.5 tablets (5 mg total) by mouth daily. 15 tablet 5   Blood Glucose Monitoring Suppl DEVI 1 each by Does not apply route in  the morning, at noon, and at bedtime. May substitute to any manufacturer covered by patient's insurance. 1 each 0   busPIRone  (BUSPAR ) 10 MG tablet Take 1 tablet (10 mg total) by mouth 3 (three) times daily. 270 tablet 1   diclofenac Sodium (VOLTAREN) 1 % GEL Apply 2 g topically as needed.     gabapentin  (NEURONTIN ) 300 MG capsule Take 1 capsule (300 mg total) by mouth 2 (two) times daily AND 2 capsules (600 mg total) at bedtime. 120 capsule 1   Glucose Blood (BLOOD GLUCOSE TEST STRIPS) STRP Check blood sugars 3 times daily 300 each 12   HYDROcodone -acetaminophen  (NORCO/VICODIN) 5-325 MG tablet Take 1 tablet by mouth every 6 (six) hours as needed for moderate pain (pain score 4-6). 60 tablet 0   Lancets (ONETOUCH DELICA PLUS LANCET30G) MISC Check blood sugars 3 times daily 300 each 12   levothyroxine  (SYNTHROID ) 25 MCG tablet 6 days a week skip sundays 90 tablet 1   lisinopril  (ZESTRIL ) 10 MG tablet Take 1 tablet (10 mg total) by mouth in the morning and at bedtime. 180 tablet 1   loratadine (CLARITIN) 10 MG tablet Take 10 mg by mouth daily.     LOW-DOSE ASPIRIN PO      meloxicam  (MOBIC ) 15 MG tablet TAKE 1 TABLET BY MOUTH EVERY DAY AS NEEDED FOR PAIN 30 tablet 3   ondansetron  (ZOFRAN ) 4 MG tablet TAKE 1 TABLET BY MOUTH EVERY 8 HOURS AS NEEDED FOR NAUSEA AND VOMITING 18 tablet 3   pantoprazole  (PROTONIX ) 40 MG tablet TAKE 1 TABLET BY MOUTH EVERY DAY 30 tablet 3   RYBELSUS  3 MG TABS TAKE  1 TABLET BY MOUTH DAILY 30 tablet 1   tiZANidine  (ZANAFLEX ) 2 MG tablet TAKE HALF TO 2 TABLETS BY MOUTH EVERY 8 HOURS AS NEEDED FOR MUSCLE SPASMS. 60 tablet 3   venlafaxine  XR (EFFEXOR -XR) 150 MG 24 hr capsule Take 1 capsule (150 mg total) by mouth every morning. 90 capsule 3   No facility-administered medications prior to visit.    Allergies[1]  Review of Systems  Constitutional:  Positive for malaise/fatigue. Negative for fever.  HENT:  Positive for congestion, sinus pain and sore throat.   Eyes:  Negative for blurred vision.  Respiratory:  Positive for sputum production. Negative for shortness of breath.   Cardiovascular:  Negative for chest pain, palpitations and leg swelling.  Gastrointestinal:  Positive for nausea and vomiting. Negative for abdominal pain and blood in stool.  Genitourinary:  Negative for dysuria and frequency.  Musculoskeletal:  Positive for joint pain and myalgias. Negative for falls.  Skin:  Negative for rash.  Neurological:  Negative for dizziness, loss of consciousness and headaches.  Endo/Heme/Allergies:  Negative for environmental allergies.  Psychiatric/Behavioral:  Positive for depression. The patient is nervous/anxious.        Objective:    Physical Exam Constitutional:      General: She is not in acute distress.    Appearance: Normal appearance. She is well-developed. She is not toxic-appearing.  HENT:     Head: Normocephalic and atraumatic.     Right Ear: External ear normal.     Left Ear: External ear normal.     Nose: Nose normal.  Eyes:     General:        Right eye: No discharge.        Left eye: No discharge.     Conjunctiva/sclera: Conjunctivae normal.  Neck:     Thyroid : No thyromegaly.  Cardiovascular:  Rate and Rhythm: Normal rate and regular rhythm.     Heart sounds: Normal heart sounds. No murmur heard. Pulmonary:     Effort: Pulmonary effort is normal. No respiratory distress.     Breath sounds: Normal breath sounds.   Abdominal:     General: Bowel sounds are normal.     Palpations: Abdomen is soft.     Tenderness: There is no abdominal tenderness. There is no guarding.  Musculoskeletal:        General: Normal range of motion.     Cervical back: Neck supple.  Lymphadenopathy:     Cervical: No cervical adenopathy.  Skin:    General: Skin is warm and dry.  Neurological:     Mental Status: She is alert and oriented to person, place, and time.  Psychiatric:        Mood and Affect: Mood normal.        Behavior: Behavior normal.        Thought Content: Thought content normal.        Judgment: Judgment normal.    LMP 09/02/2015 Comment: continous bleeding Wt Readings from Last 3 Encounters:  11/15/23 160 lb 12.8 oz (72.9 kg)  10/20/23 153 lb 6 oz (69.6 kg)  10/20/23 153 lb 3.2 oz (69.5 kg)    Diabetic Foot Exam - Simple   No data filed    Lab Results  Component Value Date   WBC 8.0 09/14/2023   HGB 13.6 09/14/2023   HCT 41.1 09/14/2023   PLT 355.0 09/14/2023   GLUCOSE 124 (H) 11/15/2023   CHOL 168 07/27/2023   TRIG 80.0 07/27/2023   HDL 65.10 07/27/2023   LDLDIRECT 146.0 09/19/2020   LDLCALC 87 07/27/2023   ALT 26 11/15/2023   AST 17 11/15/2023   NA 139 11/15/2023   K 3.7 11/15/2023   CL 102 11/15/2023   CREATININE 0.72 11/15/2023   BUN 5 (L) 11/15/2023   CO2 29 11/15/2023   TSH 0.28 (L) 09/14/2023   HGBA1C 5.9 07/27/2023   MICROALBUR <0.7 07/28/2023    Lab Results  Component Value Date   TSH 0.28 (L) 09/14/2023   Lab Results  Component Value Date   WBC 8.0 09/14/2023   HGB 13.6 09/14/2023   HCT 41.1 09/14/2023   MCV 90.5 09/14/2023   PLT 355.0 09/14/2023   Lab Results  Component Value Date   NA 139 11/15/2023   K 3.7 11/15/2023   CO2 29 11/15/2023   GLUCOSE 124 (H) 11/15/2023   BUN 5 (L) 11/15/2023   CREATININE 0.72 11/15/2023   BILITOT 0.3 11/15/2023   ALKPHOS 135 (H) 11/15/2023   AST 17 11/15/2023   ALT 26 11/15/2023   PROT 7.3 11/15/2023   ALBUMIN 4.4  11/15/2023   CALCIUM  9.1 11/15/2023   ANIONGAP 12 01/17/2018   EGFR 89 10/18/2022   GFR 94.42 11/15/2023   Lab Results  Component Value Date   CHOL 168 07/27/2023   Lab Results  Component Value Date   HDL 65.10 07/27/2023   Lab Results  Component Value Date   LDLCALC 87 07/27/2023   Lab Results  Component Value Date   TRIG 80.0 07/27/2023   Lab Results  Component Value Date   CHOLHDL 3 07/27/2023   Lab Results  Component Value Date   HGBA1C 5.9 07/27/2023       Assessment & Plan:  Type 2 diabetes mellitus with hyperlipidemia (HCC) Assessment & Plan: hgba1c acceptable, minimize simple carbs. Increase exercise as tolerated. Continue current  meds    Hypothyroidism, unspecified type Assessment & Plan: On Levothyroxine , continue to monitor   Primary hypertension Assessment & Plan: Well controlled, no changes to meds. Encouraged heart healthy diet such as the DASH diet and exercise as tolerated.     Mixed hyperlipidemia Assessment & Plan: Well controlled, no changes to meds. Encouraged heart healthy diet such as the DASH diet and exercise as tolerated. Tolerating Atorvastatin    Depression with anxiety Assessment & Plan: On Venlafaxine    Elevated alkaline phosphatase level Assessment & Plan: Repeat labs with isoenzymes     Assessment and Plan Assessment & Plan Allergic rhinitis with upper airway symptoms Chronic allergic rhinitis with postnasal drip and vomiting, likely exacerbated by cat dander. Symptoms have persisted since New Year's, with intermittent improvement. Previous loratadine use provided partial relief. No fever or chills reported. Differential includes viral infection, but symptoms suggest allergic etiology. - Switch from loratadine to cetirizine  (Zyrtec ) once daily, with option to increase to twice daily if needed. - Consider adding Astelin  nasal spray if symptoms persist. - Referred to allergist for further evaluation. - Ordered blood  work to assess dehydration status.  Type 2 diabetes mellitus with mixed hyperlipidemia Type 2 diabetes with mixed hyperlipidemia. Currently on atorvastatin  and Rybelsus . Insurance issues with Rybelsus  noted, but Trulicity  is covered. She prefers to return to Trulicity , which was effective at a low dose. - Prescribed Trulicity  starting at 0.75 mg, with refills. If insurance requires a higher dose, will adjust accordingly. - Continue atorvastatin  as prescribed.  Depression with anxiety Chronic depression with anxiety. She expresses feelings of being stuck and stressed due to personal and professional circumstances.  General Health Maintenance Immunizations and routine health maintenance discussed. Tetanus due in 2028. COVID and flu vaccines recommended, with consideration of Novavax due to previous adverse reactions to [employer]. Prevnar 20, pneumonia, and RSV vaccines recommended due to diabetes status. - Visit walk-in vaccine clinics for COVID and flu vaccines. - Consider Novavax vaccine if adverse reactions to Pfizer occur. - Receive Prevnar 20, pneumonia, and RSV vaccines.  Recording duration: 33 minutes     Harlene Horton, MD     [1]  Allergies Allergen Reactions   Promethazine Anaphylaxis   Promethazine Hcl Anaphylaxis   "

## 2024-02-29 NOTE — Assessment & Plan Note (Signed)
 hgba1c acceptable, minimize simple carbs. Increase exercise as tolerated. Continue current meds

## 2024-02-29 NOTE — Assessment & Plan Note (Signed)
 Repeat labs with isoenzymes

## 2024-02-29 NOTE — Assessment & Plan Note (Signed)
 On Levothyroxine, continue to monitor

## 2024-02-29 NOTE — Assessment & Plan Note (Signed)
 On Venlafaxine

## 2024-02-29 NOTE — Assessment & Plan Note (Signed)
 Well controlled, no changes to meds. Encouraged heart healthy diet such as the DASH diet and exercise as tolerated.

## 2024-03-01 ENCOUNTER — Encounter: Payer: Self-pay | Admitting: Family Medicine

## 2024-03-01 ENCOUNTER — Ambulatory Visit: Payer: Self-pay | Admitting: Family Medicine

## 2024-03-01 VITALS — BP 128/86 | HR 80 | Temp 97.9°F | Resp 16 | Ht 59.0 in | Wt 159.4 lb

## 2024-03-01 DIAGNOSIS — Z7984 Long term (current) use of oral hypoglycemic drugs: Secondary | ICD-10-CM

## 2024-03-01 DIAGNOSIS — F418 Other specified anxiety disorders: Secondary | ICD-10-CM

## 2024-03-01 DIAGNOSIS — E782 Mixed hyperlipidemia: Secondary | ICD-10-CM

## 2024-03-01 DIAGNOSIS — E1169 Type 2 diabetes mellitus with other specified complication: Secondary | ICD-10-CM

## 2024-03-01 DIAGNOSIS — E785 Hyperlipidemia, unspecified: Secondary | ICD-10-CM

## 2024-03-01 DIAGNOSIS — Z1159 Encounter for screening for other viral diseases: Secondary | ICD-10-CM

## 2024-03-01 DIAGNOSIS — E079 Disorder of thyroid, unspecified: Secondary | ICD-10-CM

## 2024-03-01 DIAGNOSIS — I10 Essential (primary) hypertension: Secondary | ICD-10-CM

## 2024-03-01 DIAGNOSIS — R748 Abnormal levels of other serum enzymes: Secondary | ICD-10-CM

## 2024-03-01 DIAGNOSIS — E039 Hypothyroidism, unspecified: Secondary | ICD-10-CM

## 2024-03-01 DIAGNOSIS — T7840XD Allergy, unspecified, subsequent encounter: Secondary | ICD-10-CM

## 2024-03-01 MED ORDER — AZELASTINE HCL 0.1 % NA SOLN: 2.0000 | Freq: Two times a day (BID) | NASAL | 5 refills | Status: AC

## 2024-03-01 MED ORDER — TRULICITY 0.75 MG/0.5ML ~~LOC~~ SOAJ: 0.7500 mg | SUBCUTANEOUS | 5 refills | Status: AC

## 2024-03-01 MED ORDER — CETIRIZINE HCL 10 MG PO TABS: 10.0000 mg | ORAL_TABLET | Freq: Every day | ORAL | 5 refills | Status: AC

## 2024-03-01 NOTE — Assessment & Plan Note (Signed)
 Cats in her house since May 2025 and over past 1-2 months is noting worsening congestion, PND, sinus pressure. She has started some Claritin and that has helped some. Will try to switch to Cetirizine . Will add Astelin  as well

## 2024-03-01 NOTE — Patient Instructions (Addendum)
 All [facility] are now walk in vaccine clinices M-F 9-4  Annual covid (Novavax) and flu  Tetanus is due in 2028 or sooner if injured Shingrix is the new shingles shot, 2 shots over 2-6 months, confirm coverage with insurance and document, then can return here for shots with nurse appt or at pharmacy   Prevnar 20  RSV, Respiratory Syncitial Virus vaccine

## 2024-03-08 ENCOUNTER — Ambulatory Visit: Admitting: Gastroenterology

## 2024-04-02 ENCOUNTER — Ambulatory Visit: Admitting: Family Medicine

## 2024-04-19 ENCOUNTER — Ambulatory Visit: Admitting: Gastroenterology
